# Patient Record
Sex: Female | Born: 1945 | Race: Black or African American | Hispanic: No | Smoking: Never smoker
Health system: Southern US, Community
[De-identification: ages and names within clinical notes are randomized; demographics above are authoritative.]

## PROBLEM LIST (undated history)

## (undated) ENCOUNTER — Emergency Department (HOSPITAL_COMMUNITY): Admission: EM | Payer: Medicare Other

## (undated) DIAGNOSIS — M549 Dorsalgia, unspecified: Secondary | ICD-10-CM

## (undated) DIAGNOSIS — IMO0002 Reserved for concepts with insufficient information to code with codable children: Secondary | ICD-10-CM

## (undated) DIAGNOSIS — E785 Hyperlipidemia, unspecified: Secondary | ICD-10-CM

## (undated) DIAGNOSIS — Z923 Personal history of irradiation: Secondary | ICD-10-CM

## (undated) DIAGNOSIS — E119 Type 2 diabetes mellitus without complications: Secondary | ICD-10-CM

## (undated) DIAGNOSIS — M1990 Unspecified osteoarthritis, unspecified site: Secondary | ICD-10-CM

## (undated) DIAGNOSIS — N189 Chronic kidney disease, unspecified: Secondary | ICD-10-CM

## (undated) DIAGNOSIS — E669 Obesity, unspecified: Secondary | ICD-10-CM

## (undated) DIAGNOSIS — I6529 Occlusion and stenosis of unspecified carotid artery: Secondary | ICD-10-CM

## (undated) DIAGNOSIS — I219 Acute myocardial infarction, unspecified: Secondary | ICD-10-CM

## (undated) DIAGNOSIS — I440 Atrioventricular block, first degree: Secondary | ICD-10-CM

## (undated) DIAGNOSIS — R351 Nocturia: Secondary | ICD-10-CM

## (undated) DIAGNOSIS — H269 Unspecified cataract: Secondary | ICD-10-CM

## (undated) DIAGNOSIS — I350 Nonrheumatic aortic (valve) stenosis: Secondary | ICD-10-CM

## (undated) DIAGNOSIS — I2510 Atherosclerotic heart disease of native coronary artery without angina pectoris: Secondary | ICD-10-CM

## (undated) DIAGNOSIS — I10 Essential (primary) hypertension: Secondary | ICD-10-CM

## (undated) DIAGNOSIS — R350 Frequency of micturition: Secondary | ICD-10-CM

## (undated) DIAGNOSIS — M5126 Other intervertebral disc displacement, lumbar region: Secondary | ICD-10-CM

## (undated) HISTORY — PX: COLONOSCOPY: SHX174

## (undated) HISTORY — DX: Type 2 diabetes mellitus without complications: E11.9

## (undated) HISTORY — DX: Atherosclerotic heart disease of native coronary artery without angina pectoris: I25.10

## (undated) HISTORY — DX: Occlusion and stenosis of unspecified carotid artery: I65.29

## (undated) HISTORY — PX: ABDOMINAL HYSTERECTOMY: SHX81

## (undated) HISTORY — DX: Obesity, unspecified: E66.9

## (undated) HISTORY — DX: Hyperlipidemia, unspecified: E78.5

## (undated) HISTORY — DX: Other intervertebral disc displacement, lumbar region: M51.26

## (undated) HISTORY — PX: BACK SURGERY: SHX140

## (undated) HISTORY — PX: CARPAL TUNNEL RELEASE: SHX101

## (undated) HISTORY — DX: Essential (primary) hypertension: I10

## (undated) HISTORY — PX: TONSILLECTOMY: SUR1361

## (undated) HISTORY — PX: BREAST BIOPSY: SHX20

## (undated) HISTORY — PX: CHOLECYSTECTOMY: SHX55

## (undated) HISTORY — DX: Atrioventricular block, first degree: I44.0

## (undated) HISTORY — PX: LEG TENDON SURGERY: SHX1004

---

## 2001-03-10 ENCOUNTER — Encounter: Payer: Self-pay | Admitting: Cardiology

## 2001-03-17 ENCOUNTER — Encounter: Payer: Self-pay | Admitting: Cardiology

## 2006-08-05 ENCOUNTER — Encounter: Payer: Self-pay | Admitting: Cardiology

## 2006-08-27 DIAGNOSIS — I219 Acute myocardial infarction, unspecified: Secondary | ICD-10-CM

## 2006-08-27 HISTORY — DX: Acute myocardial infarction, unspecified: I21.9

## 2006-08-27 HISTORY — PX: CARDIAC CATHETERIZATION: SHX172

## 2006-09-25 ENCOUNTER — Encounter: Payer: Self-pay | Admitting: Cardiology

## 2006-09-26 ENCOUNTER — Inpatient Hospital Stay (HOSPITAL_COMMUNITY): Admission: EM | Admit: 2006-09-26 | Discharge: 2006-09-28 | Payer: Self-pay | Admitting: Emergency Medicine

## 2006-09-26 ENCOUNTER — Ambulatory Visit: Payer: Self-pay | Admitting: Cardiology

## 2006-10-11 ENCOUNTER — Ambulatory Visit: Payer: Self-pay | Admitting: Cardiology

## 2006-10-31 ENCOUNTER — Encounter: Payer: Self-pay | Admitting: Cardiology

## 2006-11-12 ENCOUNTER — Encounter: Payer: Self-pay | Admitting: Cardiology

## 2006-12-11 ENCOUNTER — Ambulatory Visit: Payer: Self-pay | Admitting: Cardiology

## 2007-02-03 ENCOUNTER — Encounter: Payer: Self-pay | Admitting: Cardiology

## 2007-08-26 ENCOUNTER — Ambulatory Visit: Payer: Self-pay | Admitting: Cardiology

## 2007-09-04 ENCOUNTER — Encounter: Payer: Self-pay | Admitting: Physician Assistant

## 2007-09-04 ENCOUNTER — Ambulatory Visit: Payer: Self-pay | Admitting: Cardiology

## 2008-02-20 ENCOUNTER — Encounter: Payer: Self-pay | Admitting: Cardiology

## 2008-05-05 ENCOUNTER — Encounter: Payer: Self-pay | Admitting: Cardiology

## 2008-06-11 ENCOUNTER — Ambulatory Visit: Payer: Self-pay | Admitting: Cardiology

## 2009-04-14 ENCOUNTER — Encounter (INDEPENDENT_AMBULATORY_CARE_PROVIDER_SITE_OTHER): Payer: Self-pay | Admitting: *Deleted

## 2009-06-16 ENCOUNTER — Encounter: Payer: Self-pay | Admitting: Cardiology

## 2009-06-28 ENCOUNTER — Encounter: Admission: RE | Admit: 2009-06-28 | Discharge: 2009-06-28 | Payer: Self-pay | Admitting: Neurosurgery

## 2009-07-05 ENCOUNTER — Encounter: Admission: RE | Admit: 2009-07-05 | Discharge: 2009-07-05 | Payer: Self-pay | Admitting: Neurosurgery

## 2009-07-15 ENCOUNTER — Encounter: Payer: Self-pay | Admitting: Cardiology

## 2009-07-15 ENCOUNTER — Telehealth (INDEPENDENT_AMBULATORY_CARE_PROVIDER_SITE_OTHER): Payer: Self-pay | Admitting: *Deleted

## 2009-07-20 ENCOUNTER — Ambulatory Visit (HOSPITAL_COMMUNITY): Admission: RE | Admit: 2009-07-20 | Discharge: 2009-07-21 | Payer: Self-pay | Admitting: Neurosurgery

## 2009-08-24 ENCOUNTER — Ambulatory Visit: Payer: Self-pay | Admitting: Cardiology

## 2009-08-24 DIAGNOSIS — I259 Chronic ischemic heart disease, unspecified: Secondary | ICD-10-CM | POA: Insufficient documentation

## 2009-08-24 DIAGNOSIS — E119 Type 2 diabetes mellitus without complications: Secondary | ICD-10-CM | POA: Insufficient documentation

## 2009-08-24 DIAGNOSIS — Z9889 Other specified postprocedural states: Secondary | ICD-10-CM | POA: Insufficient documentation

## 2009-08-24 DIAGNOSIS — E785 Hyperlipidemia, unspecified: Secondary | ICD-10-CM | POA: Insufficient documentation

## 2010-01-20 ENCOUNTER — Telehealth: Payer: Self-pay | Admitting: Cardiology

## 2010-02-03 ENCOUNTER — Telehealth: Payer: Self-pay | Admitting: Cardiology

## 2010-02-09 ENCOUNTER — Encounter: Payer: Self-pay | Admitting: Cardiology

## 2010-02-14 ENCOUNTER — Encounter: Admission: RE | Admit: 2010-02-14 | Discharge: 2010-02-14 | Payer: Self-pay | Admitting: Neurosurgery

## 2010-02-20 ENCOUNTER — Encounter: Payer: Self-pay | Admitting: Cardiology

## 2010-02-20 ENCOUNTER — Ambulatory Visit: Payer: Self-pay | Admitting: Cardiology

## 2010-02-21 ENCOUNTER — Ambulatory Visit: Payer: Self-pay | Admitting: Oncology

## 2010-02-22 ENCOUNTER — Encounter: Payer: Self-pay | Admitting: Cardiology

## 2010-02-22 LAB — COMPREHENSIVE METABOLIC PANEL
ALT: 20 U/L (ref 0–35)
AST: 19 U/L (ref 0–37)
Albumin: 4.4 g/dL (ref 3.5–5.2)
Alkaline Phosphatase: 46 U/L (ref 39–117)
BUN: 23 mg/dL (ref 6–23)
CO2: 24 mEq/L (ref 19–32)
Calcium: 9.7 mg/dL (ref 8.4–10.5)
Chloride: 102 mEq/L (ref 96–112)
Creatinine, Ser: 1.3 mg/dL — ABNORMAL HIGH (ref 0.40–1.20)
Glucose, Bld: 193 mg/dL — ABNORMAL HIGH (ref 70–99)
Potassium: 4.1 mEq/L (ref 3.5–5.3)
Sodium: 140 mEq/L (ref 135–145)
Total Bilirubin: 0.5 mg/dL (ref 0.3–1.2)
Total Protein: 7.2 g/dL (ref 6.0–8.3)

## 2010-02-22 LAB — CBC WITH DIFFERENTIAL/PLATELET
BASO%: 0.4 % (ref 0.0–2.0)
Basophils Absolute: 0 10*3/uL (ref 0.0–0.1)
EOS%: 2.7 % (ref 0.0–7.0)
Eosinophils Absolute: 0.2 10*3/uL (ref 0.0–0.5)
HCT: 35.5 % (ref 34.8–46.6)
HGB: 11.9 g/dL (ref 11.6–15.9)
LYMPH%: 35.5 % (ref 14.0–49.7)
MCH: 30 pg (ref 25.1–34.0)
MCHC: 33.7 g/dL (ref 31.5–36.0)
MCV: 89 fL (ref 79.5–101.0)
MONO#: 0.4 10*3/uL (ref 0.1–0.9)
MONO%: 5.9 % (ref 0.0–14.0)
NEUT#: 3.6 10*3/uL (ref 1.5–6.5)
NEUT%: 55.5 % (ref 38.4–76.8)
Platelets: 273 10*3/uL (ref 145–400)
RBC: 3.98 10*6/uL (ref 3.70–5.45)
RDW: 14.9 % — ABNORMAL HIGH (ref 11.2–14.5)
WBC: 6.5 10*3/uL (ref 3.9–10.3)
lymph#: 2.3 10*3/uL (ref 0.9–3.3)

## 2010-02-22 LAB — LACTATE DEHYDROGENASE: LDH: 135 U/L (ref 94–250)

## 2010-02-23 ENCOUNTER — Ambulatory Visit: Payer: Self-pay | Admitting: Cardiology

## 2010-03-03 ENCOUNTER — Ambulatory Visit (HOSPITAL_COMMUNITY): Admission: RE | Admit: 2010-03-03 | Discharge: 2010-03-03 | Payer: Self-pay | Admitting: Oncology

## 2010-03-17 ENCOUNTER — Telehealth (INDEPENDENT_AMBULATORY_CARE_PROVIDER_SITE_OTHER): Payer: Self-pay | Admitting: *Deleted

## 2010-03-17 ENCOUNTER — Encounter: Payer: Self-pay | Admitting: Cardiology

## 2010-03-23 ENCOUNTER — Inpatient Hospital Stay (HOSPITAL_COMMUNITY): Admission: RE | Admit: 2010-03-23 | Discharge: 2010-03-24 | Payer: Self-pay | Admitting: Neurosurgery

## 2010-03-24 ENCOUNTER — Encounter: Payer: Self-pay | Admitting: Cardiology

## 2010-08-27 HISTORY — PX: CARDIAC CATHETERIZATION: SHX172

## 2010-09-15 ENCOUNTER — Ambulatory Visit: Payer: Self-pay | Admitting: Oncology

## 2010-09-17 ENCOUNTER — Encounter: Payer: Self-pay | Admitting: Neurosurgery

## 2010-09-19 ENCOUNTER — Ambulatory Visit (HOSPITAL_COMMUNITY)
Admission: RE | Admit: 2010-09-19 | Discharge: 2010-09-19 | Payer: Self-pay | Source: Home / Self Care | Attending: Oncology | Admitting: Oncology

## 2010-09-19 ENCOUNTER — Encounter: Payer: Self-pay | Admitting: Cardiology

## 2010-09-19 LAB — CMP (CANCER CENTER ONLY)
ALT(SGPT): 22 U/L (ref 10–47)
AST: 23 U/L (ref 11–38)
Albumin: 4.2 g/dL (ref 3.3–5.5)
Alkaline Phosphatase: 48 U/L (ref 26–84)
BUN, Bld: 19 mg/dL (ref 7–22)
CO2: 30 mEq/L (ref 18–33)
Calcium: 9.1 mg/dL (ref 8.0–10.3)
Chloride: 98 mEq/L (ref 98–108)
Creat: 1.3 mg/dl — ABNORMAL HIGH (ref 0.6–1.2)
Glucose, Bld: 126 mg/dL — ABNORMAL HIGH (ref 73–118)
Potassium: 4.1 mEq/L (ref 3.3–4.7)
Sodium: 145 mEq/L (ref 128–145)
Total Bilirubin: 0.6 mg/dl (ref 0.20–1.60)
Total Protein: 8 g/dL (ref 6.4–8.1)

## 2010-09-19 LAB — CBC WITH DIFFERENTIAL/PLATELET
BASO%: 0.4 % (ref 0.0–2.0)
Basophils Absolute: 0 10*3/uL (ref 0.0–0.1)
EOS%: 4.1 % (ref 0.0–7.0)
Eosinophils Absolute: 0.3 10*3/uL (ref 0.0–0.5)
HCT: 39.3 % (ref 34.8–46.6)
HGB: 13 g/dL (ref 11.6–15.9)
LYMPH%: 33.3 % (ref 14.0–49.7)
MCH: 29.4 pg (ref 25.1–34.0)
MCHC: 33 g/dL (ref 31.5–36.0)
MCV: 89.2 fL (ref 79.5–101.0)
MONO#: 0.5 10*3/uL (ref 0.1–0.9)
MONO%: 6.9 % (ref 0.0–14.0)
NEUT#: 3.7 10*3/uL (ref 1.5–6.5)
NEUT%: 55.3 % (ref 38.4–76.8)
Platelets: 276 10*3/uL (ref 145–400)
RBC: 4.41 10*6/uL (ref 3.70–5.45)
RDW: 14.8 % — ABNORMAL HIGH (ref 11.2–14.5)
WBC: 6.6 10*3/uL (ref 3.9–10.3)
lymph#: 2.2 10*3/uL (ref 0.9–3.3)

## 2010-09-19 LAB — LACTATE DEHYDROGENASE: LDH: 136 U/L (ref 94–250)

## 2010-09-22 ENCOUNTER — Ambulatory Visit
Admission: RE | Admit: 2010-09-22 | Discharge: 2010-09-22 | Payer: Self-pay | Source: Home / Self Care | Attending: Cardiology | Admitting: Cardiology

## 2010-09-26 NOTE — Progress Notes (Signed)
Summary: surgical date   Phone Note Call from Patient   Summary of Call: Just wanted to let Dr. Andee Lineman know she is having her back surgery on 7/28 with Dr. Wynetta Emery. Hoover Brunette, LPN  March 17, 2010 12:06 PM   Follow-up for Phone Call        Thanks Follow-up by: Lewayne Bunting, MD, [facility],  March 20, 2010 11:54 AM

## 2010-09-26 NOTE — Miscellaneous (Signed)
Summary: Orders Update  Clinical Lists Changes  Orders: Added new Referral order of Nuclear Med (Nuc Med) - Signed 

## 2010-09-26 NOTE — Progress Notes (Signed)
Summary: Plavix and aspirn stopped for surgery   Phone Note Call from Patient   Caller: Patient Call For: nurse Summary of Call: patient left message on machine that her plavix and aspirin has been stopped since she's having surgery on Wednesday next week and she was informed to call and let MD know. Initial call taken by: Carlye Grippe,  July 15, 2009 4:11 PM  Follow-up for Phone Call        OK to resume ASA 325 mg alone after surgery.  Lewayne Bunting, MD, [facility]  July 17, 2009 7:19 PM      Appended Document: Plavix and aspirn stopped for surgery Pt notified. Pt verbalized understanding. Pt states just bought a new bottle of Plavix. Notified she can finish this supply before stopping and changing Aspirin to 325mg  once daily if she wants to. Pt verbalized understanding.

## 2010-09-26 NOTE — Progress Notes (Signed)
Summary: PHONE: TESTING BEFORE OV   Phone Note Call from Patient   Caller: Patient Details for Reason: ? test for upcoming visit Summary of Call: Mrs. [name] was seen in December 2010. Mrs. [name] thought that you wanted her to have an echo before her next visit with you and the note states Myoview Scan. Can you review and advise? Initial call taken by: Zachary George,  Jan 20, 2010 2:01 PM  Follow-up for Phone Call        the note  is pretty clear on the subject.  The patient needs Cardiolite study Follow-up by: Lewayne Bunting, MD, [facility],  Jan 22, 2010 8:27 PM  Additional Follow-up for Phone Call Additional follow up Details #1::        Vicky, do I need to do anything with this note?   Hoover Brunette, LPN  January 26, 5187 3:17 PM

## 2010-09-26 NOTE — Letter (Signed)
Summary: Internal Correspondence/ [facility] EVALUATION   Internal Correspondence/ [facility] EVALUATION   Imported By: Dorise Hiss 03/02/2010 11:48:13  _____________________________________________________________________  External Attachment:    Type:   Image     Comment:   External Document

## 2010-09-26 NOTE — Letter (Signed)
Summary: Appointment- Rescheduled  [facility] [address]   [place], [place] [address]   Phone: [phone]  Fax: [phone]     June 16, 2009 MRN: [id number]      [facility] [address] [place], [place]  [address]     Dear [name],   Due to a change in our office schedule, your appointment on June 17, 2009                     must be changed.    Your new appointment will be August 24, 2009 at 1:15 pm.  We look forward to participating in your health care needs.        Sincerely,  Glass blower/designer

## 2010-09-26 NOTE — Assessment & Plan Note (Signed)
Summary: 6 mo fu per june reminder-srs  Medications Added METFORMIN HCL 500 MG XR24H-TAB (METFORMIN HCL) Take 2 tablet by mouth two times a day HYDROCODONE-ACETAMINOPHEN 5-500 MG TABS (HYDROCODONE-ACETAMINOPHEN) take 1-2 by mouth every 4-6 hours as needed pain      Allergies Added: NKDA  Visit Type:  Follow-up Primary Provider:  Youlanda Roys   History of Present Illness: the patient is a 65 year old female with history of coronary artery disease, status post stent placement to the LAD in 2008. She underwent a surveillance Cardiolite stress study a few days ago. Her ejection fraction was 61%. There was a small area of anteroapical ischemia versus breast attenuation. The patient however does not complain of any chest pain. She is very active and does regularly water aerobics. She experiences no problem during these activities and also has no shortness of breath.  She continues to have problems with her back. She has had prior back surgery by Dr. Denzil Magnuson now reports symptoms of sciatica. She had a repeat MRI done and apparently some abnormal lymph nodes were found.[name] She's been referred to oncology for this. She has an appointment on July 8 for a full body CT scan.  From a cardiovascular standpoint the patient is otherwise stable.  Preventive Screening-Counseling & Management  Alcohol-Tobacco     Smoking Status: never  Current Medications (verified): 1)  Toprol Xl 200 Mg Xr24h-Tab (Metoprolol Succinate) .... Take 1 Tablet By Mouth Once A Day 2)  Allopurinol 100 Mg Tabs (Allopurinol) .... Take 1 Tablet By Mouth Once A Day 3)  Norvasc 5 Mg Tabs (Amlodipine Besylate) .... Take 1 Tablet By Mouth Once A Day 4)  Lisinopril 40 Mg Tabs (Lisinopril) .... Take 1 Tablet By Mouth Once A Day 5)  Metformin Hcl 500 Mg Xr24h-Tab (Metformin Hcl) .... Take 2 Tablet By Mouth Two Times A Day 6)  Vytorin 10-20 Mg Tabs (Ezetimibe-Simvastatin) .... Take 1 Tablet By Mouth Once A Day 7)  Fish Oil 1000 Mg  Caps (Omega-3 Fatty Acids) .... Take 1 Capsule By Mouth Once A Day 8)  Hydrochlorothiazide 25 Mg Tabs (Hydrochlorothiazide) .... Take 1 Tablet By Mouth Once A Day 9)  Aspirin 325 Mg Tabs (Aspirin) .... Take 1  Tablet By Mouth Once A Day 10)  Nitroglycerin 0.4 Mg Subl (Nitroglycerin) .... As Needed 11)  Hydrocodone-Acetaminophen 5-500 Mg Tabs (Hydrocodone-Acetaminophen) .... Take 1-2 By Mouth Every 4-6 Hours As Needed Pain  Allergies (verified): No Known Drug Allergies  Comments:  Nurse/Medical Assistant: The patient's medication list and allergies were reviewed with the patient and were updated in the Medication and Allergy Lists.  Past History:  Past Medical History: Last updated: 08/24/2009 CAD LVD-MILD Hypertension DYSLIPIDEMIA OBESITY HERNIATED LUMBAR DISK FIRST DEGREE AV BLOCK HX OF GOUT TYPE 2 DIABETES  1. Multivessel coronary artery disease, quiescent.     a.     Non-STEMI/Taxus stenting 100% left anterior descending (2),      January 2008.     b.     Residual 70% circumflex; 80% right coronary artery.     c.     Mild LVD (EF 45%); improved to 55-60% by 2-D echocardiogram,      January 2009. 2. Type 2 diabetes mellitus. 3. Hypertension. 4. Dyslipidemia. 5. Obesity. 6. Herniated lumbar disk.   Past Surgical History: Last updated: 03/08/2009 NON-STEMI/TAXUS STENTING 100% PROXIMAL LAD (X2) JANUARY 2008 CARDIAC CATH 2008  Family History: Last updated: 08/24/2009 noncontributory  Social History: Last updated: 03/08/2009 Single   Risk Factors: Smoking Status:  never (02/23/2010)  Social History: Smoking Status:  never  Review of Systems  The patient denies fatigue, malaise, fever, weight gain/loss, vision loss, decreased hearing, hoarseness, chest pain, palpitations, shortness of breath, prolonged cough, wheezing, sleep apnea, coughing up blood, abdominal pain, blood in stool, nausea, vomiting, diarrhea, heartburn, incontinence, blood in urine, muscle  weakness, joint pain, leg swelling, rash, skin lesions, headache, fainting, dizziness, depression, anxiety, enlarged lymph nodes, easy bruising or bleeding, and environmental allergies.    Vital Signs:  Patient profile:   65 year old female Height:      63 inches Weight:      219 pounds O2 Sat:      98 % on Room air Pulse rate:   69 / minute BP sitting:   112 / 73  (left arm) Cuff size:   large  Vitals Entered By: Carlye Grippe (February 23, 2010 8:22 AM)  O2 Flow:  Room air  Physical Exam  Additional Exam:  General: Well-developed, well-nourished in no distress head: Normocephalic and atraumatic eyes PERRLA/EOMI intact, conjunctiva and lids normal nose: No deformity or lesions mouth normal dentition, normal posterior pharynx neck: Supple, no JVD.  No masses, thyromegaly or abnormal cervical nodes lungs: Normal breath sounds bilaterally without wheezing.  Normal percussion heart: regular rate and rhythm with normal S1 and S2, no S3 or S4.  PMI is normal.  No pathological murmurs abdomen: Normal bowel sounds, abdomen is soft and nontender without masses, organomegaly or hernias noted.  No hepatosplenomegaly musculoskeletal: Back normal, normal gait muscle strength and tone normal pulsus: Pulse is normal in all 4 extremities Extremities: No peripheral pitting edema neurologic: Alert and oriented x 3 skin: Intact without lesions or rashes cervical nodes: No significant adenopathy psychologic: Normal affect    Impression & Recommendations:  Problem # 1:  CORONARY ARTERY DISEASE, S/P PTCA (ICD-414.9) low risk Cardiolite study. Small area of anteroapical ischemia versus breast attenuation. No clinical chest pain. Continue medical therapy. Her updated medication list for this problem includes:    Toprol Xl 200 Mg Xr24h-tab (Metoprolol succinate) .[name]... Take 1 tablet by mouth once a day    Norvasc 5 Mg Tabs (Amlodipine besylate) .[name]... Take 1 tablet by mouth once a day    Lisinopril 40  Mg Tabs (Lisinopril) .[name]... Take 1 tablet by mouth once a day    Aspirin 325 Mg Tabs (Aspirin) .[name]... Take 1  tablet by mouth once a day    Nitroglycerin 0.4 Mg Subl (Nitroglycerin) .[name]... As needed  Problem # 2:  DYSLIPIDEMIA (ICD-272.4) followed by the patient's primary care physician. Her updated medication list for this problem includes:    Vytorin 10-20 Mg Tabs (Ezetimibe-simvastatin) .[name]... Take 1 tablet by mouth once a day  Problem # 3:  DM (ICD-250.00) Assessment: Comment Only  Her updated medication list for this problem includes:    Lisinopril 40 Mg Tabs (Lisinopril) .[name]... Take 1 tablet by mouth once a day    Metformin Hcl 500 Mg Xr24h-tab (Metformin hcl) .[name]... Take 2 tablet by mouth two times a day    Aspirin 325 Mg Tabs (Aspirin) .[name]... Take 1  tablet by mouth once a day  Patient Instructions: 1)  Your physician wants you to follow-up in: 6 months. You will receive a reminder letter in the mail one-two months in advance. If you don't receive a letter, please call our office to schedule the follow-up appointment. 2)  Your physician recommends that you continue on your current medications as directed. Please refer to  the Current Medication list given to you today.

## 2010-09-26 NOTE — Miscellaneous (Signed)
Summary: med list  Clinical Lists Changes  Medications: Changed medication from CALCIUM 600 MG TABS (CALCIUM) to CALCIUM 600 MG TABS (CALCIUM) once daily - Signed

## 2010-09-26 NOTE — Letter (Signed)
Summary: Internal Correspondence/ [facility] PROGRESS NO  Internal Correspondence/ [facility] PROGRESS NOTE   Imported By: Dorise Hiss 03/23/2010 15:50:26  _____________________________________________________________________  External Attachment:    Type:   Image     Comment:   External Document

## 2010-09-26 NOTE — Progress Notes (Signed)
Summary: PHONE: MYOVIEW TEST   Phone Note Call from Patient   Summary of Call: Dr. Andee Lineman. Please advise on [name] what type of Myoview scan would you like for her to have and if she needs to be on meds or off meds. Initial call taken by: Zachary George,  February 03, 2010 9:53 AM  Follow-up for Phone Call        Lexiscan on medications. Follow-up by: Lewayne Bunting, MD, [facility],  February 05, 2010 12:49 PM

## 2010-09-26 NOTE — Assessment & Plan Note (Signed)
Summary: 6 MO FU PER APRIL REMINDER-SRS  Medications Added METFORMIN HCL 500 MG TABS (METFORMIN HCL) take one by mouth in am and two in pm ASPIRIN 325 MG TABS (ASPIRIN) Take 1  tablet by mouth once a day      Allergies Added: NKDA  Visit Type:  Follow-up Primary Provider:  Neita Carp  CC:  follow-up visit.  History of Present Illness: the patient is a 65 year old female with a history of coronary artery disease status post stent placement to the LAD in 2008. She has normal LV function. She recently has undergone back surgery. She had no cardiovascular complications. Reports no chest pain shortness of breath with talking or PND. She is now doing remarkably well with able to walk she'll follow up with Dr. Wynetta Emery in January. She has no orthopnea PND palpitations or syncope.  Current Medications (verified): 1)  Toprol Xl 200 Mg Xr24h-Tab (Metoprolol Succinate) .... Take 1 Tablet By Mouth Once A Day 2)  Allopurinol 100 Mg Tabs (Allopurinol) .... Take 1 Tablet By Mouth Once A Day 3)  Norvasc 5 Mg Tabs (Amlodipine Besylate) .... Take 1 Tablet By Mouth Once A Day 4)  Lisinopril 40 Mg Tabs (Lisinopril) .... Take 1 Tablet By Mouth Once A Day 5)  Metformin Hcl 500 Mg Tabs (Metformin Hcl) .... Take One By Mouth in Am and Two in Pm 6)  Vytorin 10-20 Mg Tabs (Ezetimibe-Simvastatin) .... Take 1 Tablet By Mouth Once A Day 7)  Fish Oil 1000 Mg Caps (Omega-3 Fatty Acids) .... Take 1 Capsule By Mouth Once A Day 8)  Hydrochlorothiazide 25 Mg Tabs (Hydrochlorothiazide) .... Take 1 Tablet By Mouth Once A Day 9)  Aspirin 325 Mg Tabs (Aspirin) .... Take 1  Tablet By Mouth Once A Day 10)  Nitroglycerin 0.4 Mg Subl (Nitroglycerin) .... As Needed 11)  Hydrocodone .... As Needed 12)  Multi Vit  Allergies (verified): No Known Drug Allergies  Comments:  Nurse/Medical Assistant: The patient's medications and allergies were reviewed with the patient and were updated in the Medication and Allergy Lists. Patient   brought list to office.  Past History:  Past Surgical History: Last updated: 03/08/2009 NON-STEMI/TAXUS STENTING 100% PROXIMAL LAD (X2) JANUARY 2008 CARDIAC CATH 2008  Past Medical History: CAD LVD-MILD Hypertension DYSLIPIDEMIA OBESITY HERNIATED LUMBAR DISK FIRST DEGREE AV BLOCK HX OF GOUT TYPE 2 DIABETES  1. Multivessel coronary artery disease, quiescent.     a.     Non-STEMI/Taxus stenting 100% left anterior descending (2),      January 2008.     b.     Residual 70% circumflex; 80% right coronary artery.     c.     Mild LVD (EF 45%); improved to 55-60% by 2-D echocardiogram,      January 2009. 2. Type 2 diabetes mellitus. 3. Hypertension. 4. Dyslipidemia. 5. Obesity. 6. Herniated lumbar disk.   Family History: Reviewed history and no changes required. noncontributory  Social History: Reviewed history from 03/08/2009 and no changes required. Single   Vital Signs:  Patient profile:   65 year old female Height:      63 inches Weight:      214 pounds BMI:     38.05 O2 Sat:      98 % Pulse rate:   87 / minute BP sitting:   129 / 83  (left arm) Cuff size:   large  Vitals Entered By: Carlye Grippe (August 24, 2009 1:23 PM)  Nutrition Counseling: Patient's BMI is greater than 25 and  therefore counseled on weight management options. CC: follow-up visit   Physical Exam  General:  Well developed, well nourished, in no acute distress. Head:  normocephalic and atraumatic Eyes:  PERRLA/EOM intact; conjunctiva and lids normal. Mouth:  Teeth, gums and palate normal. Oral mucosa normal. Neck:  Neck supple, no JVD. No masses, thyromegaly or abnormal cervical nodes. Lungs:  Clear bilaterally to auscultation and percussion. Heart:  Non-displaced PMI, chest non-tender; regular rate and rhythm, S1, S2 without murmurs, rubs or gallops. Carotid upstroke normal, no bruit. Normal abdominal aortic size, no bruits. Femorals normal pulses, no bruits. Pedals normal pulses. No  edema, no varicosities. Abdomen:  Bowel sounds positive; abdomen soft and non-tender without masses, organomegaly, or hernias noted. No hepatosplenomegaly. Msk:  Back normal, normal gait. Muscle strength and tone normal. Pulses:  pulses normal in all 4 extremities Extremities:  No clubbing or cyanosis. Neurologic:  Alert and oriented x 3. Skin:  Intact without lesions or rashes. Cervical Nodes:  no significant adenopathy Psych:  Normal affect.   Impression & Recommendations:  Problem # 1:  CORONARY ARTERY DISEASE, S/P PTCA (ICD-414.9) patient reports no chest pain. For a Cardiolite study in 6 months. This will be arranged before her next visit. The following medications were removed from the medication list:    Plavix 75 Mg Tabs (Clopidogrel bisulfate) .[name]... Take 1 tablet by mouth once a day Her updated medication list for this problem includes:    Toprol Xl 200 Mg Xr24h-tab (Metoprolol succinate) .[name]... Take 1 tablet by mouth once a day    Norvasc 5 Mg Tabs (Amlodipine besylate) .[name]... Take 1 tablet by mouth once a day    Lisinopril 40 Mg Tabs (Lisinopril) .[name]... Take 1 tablet by mouth once a day    Aspirin 325 Mg Tabs (Aspirin) .[name]... Take 1  tablet by mouth once a day    Nitroglycerin 0.4 Mg Subl (Nitroglycerin) .[name]... As needed  Problem # 2:  DYSLIPIDEMIA (ICD-272.4) the patient on statin drug therapy. Her updated medication list for this problem includes:    Vytorin 10-20 Mg Tabs (Ezetimibe-simvastatin) .[name]... Take 1 tablet by mouth once a day  Problem # 3:  DM (ICD-250.00) Assessment: Comment Only  Her updated medication list for this problem includes:    Lisinopril 40 Mg Tabs (Lisinopril) .[name]... Take 1 tablet by mouth once a day    Metformin Hcl 500 Mg Tabs (Metformin hcl) .[name]... Take one by mouth in am and two in pm    Aspirin 325 Mg Tabs (Aspirin) .[name]... Take 1  tablet by mouth once a day  Problem # 4:  LAMINECTOMY, HX OF (ICD-V45.89) no cardiovascular complications during  surgery.  Patient Instructions: 1)  Myoview scan on meds in 6 months before next visit 2)  Follow up in  6 months

## 2010-09-28 NOTE — Assessment & Plan Note (Signed)
Summary: 6 MO FU PER JAN REMINDER-SRS  Medications Added VITAMIN D3 1000 UNIT TABS (CHOLECALCIFEROL) Take 1 tablet by mouth once a day      Allergies Added: NKDA  Visit Type:  Follow-up Primary Provider:  Youlanda Roys   History of Present Illness: the patient is a 65 year old African-American female with prior history of stent placement in 2008.  The patient is currently not on Plavix anymore.  She has normal LV function.  She has been doing well.  She denies any chest pain shortness of breath orthopnea PND.  Chest no palpitations or syncope.  The patient underwent back surgery for spinal stenosis.  She developed no perioperative cardiac complications.  She is doing quite well.  She does remain compliant with her medical therapy and her blood pressure is under very good control.  The patient reports no palpitations presyncope or syncope.  Preventive Screening-Counseling & Management  Alcohol-Tobacco     Smoking Status: never  Current Medications (verified): 1)  Toprol Xl 200 Mg Xr24h-Tab (Metoprolol Succinate) .... Take 1 Tablet By Mouth Once A Day 2)  Allopurinol 100 Mg Tabs (Allopurinol) .... Take 1 Tablet By Mouth Once A Day 3)  Norvasc 5 Mg Tabs (Amlodipine Besylate) .... Take 1 Tablet By Mouth Once A Day 4)  Lisinopril 40 Mg Tabs (Lisinopril) .... Take 1 Tablet By Mouth Once A Day 5)  Metformin Hcl 500 Mg Xr24h-Tab (Metformin Hcl) .... Take 2 Tablet By Mouth Two Times A Day 6)  Vytorin 10-20 Mg Tabs (Ezetimibe-Simvastatin) .... Take 1 Tablet By Mouth Once A Day 7)  Fish Oil 1000 Mg Caps (Omega-3 Fatty Acids) .... Take 1 Capsule By Mouth Once A Day 8)  Hydrochlorothiazide 25 Mg Tabs (Hydrochlorothiazide) .... Take 1 Tablet By Mouth Once A Day 9)  Aspirin 325 Mg Tabs (Aspirin) .... Take 1  Tablet By Mouth Once A Day 10)  Nitroglycerin 0.4 Mg Subl (Nitroglycerin) .... As Needed 11)  Hydrocodone-Acetaminophen 5-500 Mg Tabs (Hydrocodone-Acetaminophen) .... Take 1-2 By Mouth  Every 4-6 Hours As Needed Pain 12)  Vitamin D3 1000 Unit Tabs (Cholecalciferol) .... Take 1 Tablet By Mouth Once A Day  Allergies (verified): No Known Drug Allergies  Comments:  Nurse/Medical Assistant: The patient's medication list and allergies were reviewed with the patient and were updated in the Medication and Allergy Lists.  Past History:  Past Surgical History: Last updated: 03/08/2009 NON-STEMI/TAXUS STENTING 100% PROXIMAL LAD (X2) JANUARY 2008 CARDIAC CATH 2008  Family History: Last updated: 08/24/2009 noncontributory  Social History: Last updated: 03/08/2009 Single   Risk Factors: Smoking Status: never (09/22/2010)  Past Medical History: CAD LVD-MILD Hypertension DYSLIPIDEMIA OBESITY HERNIATED LUMBAR DISK FIRST DEGREE AV BLOCK HX OF GOUT TYPE 2 DIABETES  1. Multivessel coronary artery disease, quiescent.     a.     Non-STEMI/Taxus stenting 100% left anterior descending (2),      January 2008.     b.     Residual 70% circumflex; 80% right coronary artery.     c.     Mild LVD (EF 45%); improved to 55-60% by 2-D echocardiogram,      January 2009. 2. Type 2 diabetes mellitus. 3. Hypertension. 4. Dyslipidemia. 5. Obesity. 6. Herniated lumbar disk.  Cardiolite 2011 low risk with small anterior defect consistent with attenuation artifact.  Normal LV function.  Vital Signs:  Patient profile:   65 year old female Height:      63 inches Weight:      218 pounds BMI:  38.76 Pulse rate:   69 / minute BP sitting:   110 / 78  (left arm) Cuff size:   large  Vitals Entered By: Carlye Grippe (September 22, 2010 10:33 AM)  Nutrition Counseling: Patient's BMI is greater than 25 and therefore counseled on weight management options.  Physical Exam  Additional Exam:  General: Well-developed, well-nourished in no distress head: Normocephalic and atraumatic eyes PERRLA/EOMI intact, conjunctiva and lids normal nose: No deformity or lesions mouth normal  dentition, normal posterior pharynx neck: Supple, no JVD.  No masses, thyromegaly or abnormal cervical nodes lungs: Normal breath sounds bilaterally without wheezing.  Normal percussion heart: regular rate and rhythm with normal S1 and S2, no S3 or S4.  PMI is normal.  No pathological murmurs abdomen: Normal bowel sounds, abdomen is soft and nontender without masses, organomegaly or hernias noted.  No hepatosplenomegaly musculoskeletal: Back normal, normal gait muscle strength and tone normal pulsus: Pulse is normal in all 4 extremities Extremities: No peripheral pitting edema neurologic: Alert and oriented x 3 skin: Intact without lesions or rashes cervical nodes: No significant adenopathy psychologic: Normal affect    Impression & Recommendations:  Problem # 1:  DYSLIPIDEMIA (ICD-272.4) followed by primary care physician. Her updated medication list for this problem includes:    Vytorin 10-20 Mg Tabs (Ezetimibe-simvastatin) .[name]... Take 1 tablet by mouth once a day  Problem # 2:  LAMINECTOMY, HX OF (ICD-V45.89) the patient underwent successful laminectomy.  She had no perioperative cardiac complications.  Problem # 3:  CORONARY ARTERY DISEASE, S/P PTCA (ICD-414.9) the patient status post prior coronary intervention.  She had a stress test done of last year, which showed no ischemia.  No further testing is currently required she has normal LV function. Her updated medication list for this problem includes:    Toprol Xl 200 Mg Xr24h-tab (Metoprolol succinate) .[name]... Take 1 tablet by mouth once a day    Norvasc 5 Mg Tabs (Amlodipine besylate) .[name]... Take 1 tablet by mouth once a day    Lisinopril 40 Mg Tabs (Lisinopril) .[name]... Take 1 tablet by mouth once a day    Aspirin 325 Mg Tabs (Aspirin) .[name]... Take 1  tablet by mouth once a day    Nitroglycerin 0.4 Mg Subl (Nitroglycerin) .[name]... As needed  Problem # 4:  DM (ICD-250.00) Assessment: Comment Only  Her updated medication list for this  problem includes:    Lisinopril 40 Mg Tabs (Lisinopril) .[name]... Take 1 tablet by mouth once a day    Metformin Hcl 500 Mg Xr24h-tab (Metformin hcl) .[name]... Take 2 tablet by mouth two times a day    Aspirin 325 Mg Tabs (Aspirin) .[name]... Take 1  tablet by mouth once a day  Patient Instructions: 1)  Your physician wants you to follow-up in: 1 year. You will receive a reminder letter in the mail one-two months in advance. If you don't receive a letter, please call our office to schedule the follow-up appointment. 2)  Your physician recommends that you continue on your current medications as directed. Please refer to the Current Medication list given to you today.

## 2010-09-28 NOTE — Cardiovascular Report (Signed)
Summary: Cardiac Catheterization  Cardiac Catheterization   Imported By: Dorise Hiss 09/22/2010 08:56:49  _____________________________________________________________________  External Attachment:    Type:   Image     Comment:   External Document

## 2010-09-28 NOTE — Letter (Signed)
Summary: Discharge Summary  Discharge Summary   Imported By: Dorise Hiss 09/22/2010 08:58:08  _____________________________________________________________________  External Attachment:    Type:   Image     Comment:   External Document

## 2010-11-11 LAB — GLUCOSE, CAPILLARY
Glucose-Capillary: 109 mg/dL — ABNORMAL HIGH (ref 70–99)
Glucose-Capillary: 109 mg/dL — ABNORMAL HIGH (ref 70–99)
Glucose-Capillary: 127 mg/dL — ABNORMAL HIGH (ref 70–99)
Glucose-Capillary: 134 mg/dL — ABNORMAL HIGH (ref 70–99)
Glucose-Capillary: 146 mg/dL — ABNORMAL HIGH (ref 70–99)

## 2010-11-11 LAB — CBC
HCT: 37.1 % (ref 36.0–46.0)
Hemoglobin: 12.3 g/dL (ref 12.0–15.0)
MCH: 30.2 pg (ref 26.0–34.0)
MCHC: 33.2 g/dL (ref 30.0–36.0)
MCV: 91 fL (ref 78.0–100.0)
Platelets: 246 10*3/uL (ref 150–400)
RBC: 4.07 MIL/uL (ref 3.87–5.11)
RDW: 15.1 % (ref 11.5–15.5)
WBC: 6.6 10*3/uL (ref 4.0–10.5)

## 2010-11-11 LAB — COMPREHENSIVE METABOLIC PANEL
ALT: 28 U/L (ref 0–35)
AST: 27 U/L (ref 0–37)
Albumin: 4.1 g/dL (ref 3.5–5.2)
Alkaline Phosphatase: 46 U/L (ref 39–117)
BUN: 22 mg/dL (ref 6–23)
CO2: 30 mEq/L (ref 19–32)
Calcium: 9.5 mg/dL (ref 8.4–10.5)
Chloride: 101 mEq/L (ref 96–112)
Creatinine, Ser: 1.22 mg/dL — ABNORMAL HIGH (ref 0.4–1.2)
GFR calc Af Amer: 54 mL/min — ABNORMAL LOW (ref >60)
GFR calc non Af Amer: 44 mL/min — ABNORMAL LOW (ref >60)
Glucose, Bld: 115 mg/dL — ABNORMAL HIGH (ref 70–99)
Potassium: 4.2 mEq/L (ref 3.5–5.1)
Sodium: 138 mEq/L (ref 135–145)
Total Bilirubin: 0.6 mg/dL (ref 0.3–1.2)
Total Protein: 7.4 g/dL (ref 6.0–8.3)

## 2010-11-11 LAB — SURGICAL PCR SCREEN
MRSA, PCR: NEGATIVE
Staphylococcus aureus: NEGATIVE

## 2010-11-29 LAB — BASIC METABOLIC PANEL
BUN: 15 mg/dL (ref 6–23)
CO2: 27 mEq/L (ref 19–32)
Calcium: 9.5 mg/dL (ref 8.4–10.5)
Chloride: 101 mEq/L (ref 96–112)
Creatinine, Ser: 1.24 mg/dL — ABNORMAL HIGH (ref 0.4–1.2)
GFR calc Af Amer: 53 mL/min — ABNORMAL LOW (ref >60)
GFR calc non Af Amer: 44 mL/min — ABNORMAL LOW (ref >60)
Glucose, Bld: 128 mg/dL — ABNORMAL HIGH (ref 70–99)
Potassium: 3.7 mEq/L (ref 3.5–5.1)
Sodium: 137 mEq/L (ref 135–145)

## 2010-11-29 LAB — CBC
HCT: 38.8 % (ref 36.0–46.0)
Hemoglobin: 13.3 g/dL (ref 12.0–15.0)
MCHC: 34.2 g/dL (ref 30.0–36.0)
MCV: 91.4 fL (ref 78.0–100.0)
Platelets: 258 10*3/uL (ref 150–400)
RBC: 4.25 MIL/uL (ref 3.87–5.11)
RDW: 14.9 % (ref 11.5–15.5)
WBC: 7.4 10*3/uL (ref 4.0–10.5)

## 2010-11-29 LAB — GLUCOSE, CAPILLARY
Glucose-Capillary: 118 mg/dL — ABNORMAL HIGH (ref 70–99)
Glucose-Capillary: 120 mg/dL — ABNORMAL HIGH (ref 70–99)
Glucose-Capillary: 131 mg/dL — ABNORMAL HIGH (ref 70–99)
Glucose-Capillary: 133 mg/dL — ABNORMAL HIGH (ref 70–99)
Glucose-Capillary: 143 mg/dL — ABNORMAL HIGH (ref 70–99)

## 2010-11-29 LAB — PROTIME-INR
INR: 0.88 (ref 0.00–1.49)
Prothrombin Time: 11.9 seconds (ref 11.6–15.2)

## 2010-11-29 LAB — APTT: aPTT: 37 seconds (ref 24–37)

## 2010-12-04 ENCOUNTER — Other Ambulatory Visit: Payer: Self-pay | Admitting: *Deleted

## 2010-12-04 MED ORDER — HYDROCHLOROTHIAZIDE 25 MG PO TABS: 25.0000 mg | ORAL_TABLET | Freq: Every day | ORAL | Status: DC

## 2010-12-04 NOTE — Telephone Encounter (Signed)
Patient called and requested prescriptions be sent to both pharmacies

## 2011-01-09 NOTE — Assessment & Plan Note (Signed)
[facility]                          [facility]   LUKISHA, PROCIDA                        MRN:          [id number]  DATE:08/26/2007                            DOB:          Feb 09, 1946    PRIMARY CARDIOLOGIST:  Learta Codding, MD,FACC   REASON FOR VISIT:  Scheduled 66-month followup.   Since last seen here in the clinic in April, by Dr. Andee Lineman, the patient  continues to do well from a cardiovascular standpoint with no interim  development of angina pectoris.  She is now nearly one year out from a a  non-ST elevation myocardial infarction in late January, treated with  drug-eluting stenting of the left anterior descending artery.   The patient reports compliance with her medications, good blood pressure  control at home, and has no history of tobacco smoking.   At time of her last visit, she was placed on hydrochlorothiazide 25  daily for better blood pressure control.  This has resulted in  significant improvement, as noted by her current blood pressure reading.   The patient does report arthritis of the knees, but is exercising on a  regular basis with water aerobics at the [facility], three times a week.   Electrocardiogram today reveals first degree AV block at 81 bpm with a  normal axis; chronic Q-waves in lead V1 in V2 with a poor R-wave  progression; no acute changes.   CURRENT MEDICATIONS:  1. Plavix full dose aspirin.  2. Toprol XL 200 daily.  3. Norvasc 5 daily.  4. Allopurinol 100 daily.  5. Lisinopril 40 daily.  6. Metformin ER 500 b.i.d.  7. Vytorin 10/20 daily.  8. Fish oil 1000 daily.  9. Hydrochlorothiazide 25 daily.  10.Anaprox 500 daily.   PHYSICAL EXAMINATION:  Blood pressure 129/85, pulse 94, regular; weight  232.  GENERAL:  65 year old female sitting upright in no distress.  HEENT: Normocephalic, atraumatic.  NECK: Palpable bilateral carotid pulses without bruits. No  JVD at 90  degrees.  LUNGS:   Clear to auscultation in all fields.  HEART: Regular rate and rhythm (S1, S2). No significant murmurs.  ABDOMEN: Protuberant, nontender.  EXTREMITIES: No significant edema.  NEURO: No focal deficit.   IMPRESSION:  1. Multi-vessel coronary artery disease.      a.     Non-STEMI/TAXUS stenting 100% proximal LAD (x2), January       2008 .      b.     Residual 70% proximal circumflex; 80% proximal RCA.      c.     Mild LVD (ejection fraction 45%); anterior hypokinesis.  2. Type 2 diabetes mellitus.  3. Hypertension.  4. Dyslipidemia.      a.     Total cholesterol 145, triglyceride 196, HDL 38 and LDL 67,       June 2008.  5. Obesity.  6. First-degree AV block.  7. History of gout.   PLAN:  1. 2-D echocardiogram for reassessment of left ventricle function.  2. Down titrate aspirin initially to 162, with target goal of 81 mg  daily.  Continue Plavix for a full 2 years.  3. Schedule return clinic follow-up with myself and Dr. Andee Lineman in 6      months.      Gene Serpe, PA-C  Electronically Signed      Learta Codding, MD,FACC  Electronically Signed   GS/MedQ  DD: 08/26/2007  DT: 08/26/2007  Job #: [id number]   cc:   Laural Roes, [facility]  Fara [place]ute

## 2011-01-09 NOTE — Assessment & Plan Note (Signed)
[facility]                          [facility]   [name], [name]                        MRN:          [id number]  DATE:06/11/2008                            DOB:          [birth year]    PRIMARY CARDIOLOGIST:  Chelsea Codding, MD, [name]   REASON FOR VISIT:  A 57-month followup.   [name] returns to our clinic for scheduled followup, reporting no  interim development of signs/symptoms suggestive of unstable angina  pectoris.  She has, however, become plagued by persistent sciatic pain  of the right lower extremity.  This has recently been evaluated and she  is awaiting scheduling for lumbar laminectomy within the next week or  so.   EKG today reveals NSR at 89 bpm with normal axis and chronic changes in  the anteroseptal leads.   CURRENT MEDICATIONS:  1. Aspirin 162 daily.  2. Plavix.  3. Toprol-XL 200 daily.  4. Norvasc 5 daily.  5. Lisinopril 40 daily.  6. Vytorin 10/20 daily.  7. Hydrochlorothiazide 25 daily.  8. Metformin ER 500 b.i.d.  9. Allopurinol 100 daily.   PHYSICAL EXAMINATION:  VITAL SIGNS:  Blood pressure 141/92, pulse 91,  regular, weight 219.  GENERAL:  A 65 year old female, obese, sitting upright, in no distress.  HEENT:  Normocephalic and atraumatic.  NECK:  Palpable carotid pulses without bruits; no JVD.  LUNGS:  Clear to auscultation.  HEART:  Regular rate and rhythm.  Soft S4.  No significant murmurs.  ABDOMEN:  Protuberant, nontender.  EXTREMITIES:  No significant edema.  NEURO:  No focal deficit.   IMPRESSION:  1. Multivessel coronary artery disease, quiescent.      a.     Non-STEMI/Taxus stenting 100% left anterior descending (2),       January 2008.      b.     Residual 70% circumflex; 80% right coronary artery.      c.     Mild LVD (EF 45%); improved to 55-60% by 2-D echocardiogram,       January 2009.  2. Type 2 diabetes mellitus.  3. Hypertension.  4. Dyslipidemia.  5. Obesity.  6.  Herniated lumbar disk.   PLAN:  1. The patient is cleared from a cardiac standpoint to proceed with      lumbar surgery, as recommended.  She is at low risk for      perioperative complications from a cardiac standpoint.  She is now      one and a half years out from having undergone treatment with a      drug-eluting stent, and can come off Plavix for 5 days prior to her      scheduled procedure.  She will then need to resume this, once      cleared from a surgical standpoint.  2. Downtitrate aspirin to 81 mg daily, to be continued indefinitely.  3. Aggressive lipid management recommended with LDL target of 70 or      less, if possible.  4. P.r.n. nitroglycerin.  5. Schedule return clinic follow up with myself and Dr. Andee Bird in 6  months.      Chelsea Serpe, PA-C  Electronically Signed      Chelsea Codding, MD,FACC  Electronically Signed   GS/MedQ  DD: 06/11/2008  DT: 06/12/2008  Job #: [id number]   cc:   [name]

## 2011-01-12 NOTE — H&P (Signed)
[name], [name]               ACCOUNT NO.:  [id number]   MEDICAL RECORD NO.:  [id number]          PATIENT TYPE:  INP   LOCATION:  [facility]                          FACILITY:  [facility]   PHYSICIAN:  Skeet Latch, DO    DATE OF BIRTH:  1946/08/16   DATE OF ADMISSION:  09/26/2006  DATE OF DISCHARGE:  [facility]                              HISTORY & PHYSICAL   PRIMARY CARE PHYSICIAN:  The patient does not have a primary care  physician.   CHIEF COMPLAINT:  Chest pain.   HISTORY OF PRESENT ILLNESS:  This is a 65 year old African American  female who presents complaining of some chest discomfort.  The patient  states that this evening she experienced a sudden onset of chest  discomfort that was severe in nature, that when I examine her, she  points to her epigastric region.  The patient states this pain started,  lasted for a few minutes, then subsided.  The patient states she then  went to church and while coming home from church, the pain returned.  The patient states she went back to church and her pastor prayed for  her, then the patient left again.  The patient states then the pain  returned again and the patient decided to come to the ER for evaluation.  The patient states the pain is sharp in nature and was about an 8/10.  The patient's symptoms were persistent, but did not have any radiation.  Denied any nausea, vomiting, headache, lightheadedness or dizziness with  the pain.  The patient states at the time after the morphine the  symptoms have resolved.  The patient states she had a similar episode of  pain approximately 3-4 years ago and had a negative stress test.  The  patient said at that time they said it was heartburn and has not had any  pain until this evening.   PAST MEDICAL HISTORY:  1. Non-insulin-dependent diabetes.  2. Hypertension.  3. Gout.   PAST SURGICAL HISTORY:  1. Cholecystectomy.  2. Hysterectomy.  3. Breast biopsy.   SOCIAL HISTORY:  She denies any  tobacco, alcohol or illicit drug use.   ALLERGIES:  No known drug allergies.   REVIEW OF SYSTEMS:  HEENT:  Negative.  RESPIRATORY:  Negative for cough,  dyspnea or wheezing.  GI:  At this point, there is some epigastric pain  that has resolved.  GU:  Denies any urinary complaints.  MUSCULOSKELETAL:  Positive for some chronic back pain.  SKIN:  Negative.  NEUROLOGICAL:  Negative.  PSYCHIATRIC:  Negative.   MEDICATIONS:  1. The patient states she takes Celebrex daily.  2. She believes she takes Glucophage 500 mg once a day.  3. She takes Toprol-XL daily.  4. She is on a cholesterol medication and 2 other hypertensive      medicines that she cannot remember.   PHYSICAL EXAMINATION:  VITAL SIGNS:  Temperature is 97.8, blood pressure  is 139/79, pulse 65, respirations 18.  The patient is saturating 100%.  GENERAL:  The patient is pleasant, cooperative, appears stated age,  obese.  HEENT:  Head  is atraumatic and normocephalic.  Eyes:  PERRLA.  EOMI.  NECK:  Supple, nontender and non-distended.  No JVD.  No thyromegaly  appreciated.  CARDIOVASCULAR:  S1 and S2.  Regular rate and rhythm.  No rubs, gallops  or murmurs.  LUNGS:  Clear to auscultation bilaterally, no rales, rhonchi or  wheezing.  ABDOMEN:  Some epigastric tenderness on palpation.  Abdomen was soft and  non-distended.  Positive bowel sounds present.  EXTREMITIES:  No clubbing or cyanosis.  Has some trace edema.  NEUROLOGIC:  Cranial nerves II-XII are grossly intact.  The patient was  alert and oriented x3.   LABORATORY DATA:  PT 12.9, INR 1.0, PTT 35, D-dimer 0.27.  White blood  cell count 7.7, hemoglobin 13.8, hematocrit 41.2, platelets 274,000.  Sodium 132, potassium 3.6, chloride 99, CO2 22, glucose 164, BUN 13,  creatinine 1.05.  First troponin was 0.41; second set was 0.35, third  set 0.23.  Her urine was negative.   EKG showed normal sinus rhythm.   ASSESSMENT/PLAN:  1. Atypical chest pain.  The patient will be  admitted with diagnosis      of chest pain.  We will repeat cardiac enzymes q.6 h. x3, repeat      CBC, BMP, liver profile and TSH in the morning; also, I will repeat      her EKG.  We will get a Cardiology consult also.  The patient was      placed on heparin drip and we will continue for now.  The patient      was also placed on a nitroglycerin drip and we will switch to      Nitrol paste q.6 h.  The patient will be placed on deep venous      thrombosis as well as gastrointestinal prophylaxis with Lovenox and      Prevacid.  For now, we will place the patient on Toprol-XL 25 mg      and lisinopril 5 mg daily until her medications are reconciled.      The patient will be continued on morphine intravenously as well as      Zofran intravenously for nausea.  We will discontinue ultrasound of      gallbladder; the patient does not have a gallbladder.  2. Non-insulin-dependent diabetes.  The patient will be on a sliding-      scale insulin coverage for now until her medications are obtained.      She will get glucose checks before meals and at bedtime.  3. Hypertension.  The patient will be placed on lisinopril 5 mg also      until her medications are obtained.  As stated previously, we will      get a Cardiology consult due to the patient's risk factors of age,      weight and her diabetes.  There is a strong indication that this      may be gastrointestinal in nature, but due to the patient's medical      history, I feel cardiology workup needs to be followed as well as      her elevated troponins.  I will follow the patient closely.      Skeet Latch, DO  Electronically Signed     SM/MEDQ  D:  09/26/2006  T:  09/26/2006  Job:  [id number]

## 2011-01-12 NOTE — H&P (Signed)
NAMESEVYN, [name]               ACCOUNT NO.:  [id number]   MEDICAL RECORD NO.:  [id number]          PATIENT TYPE:  OUT   LOCATION:  CATH                         FACILITY:  [facility]   PHYSICIAN:  Everardo Beals. Juanda Chance, MD, FACCDATE OF BIRTH:  [birth year]   DATE OF ADMISSION:  09/26/2006  DATE OF DISCHARGE:                              HISTORY & PHYSICAL   PRIMARY CARE PHYSICIAN:  Primary care physician:  Dr. Feliz Beam,  [place].  Primary Cardiologist:  New, she will be seen by Dr.  Charlies Constable.   HISTORY OF PRESENT ILLNESS:  The patient is a 65 year old obese, African-  American female who began to experience chest pressure yesterday on  September 25, 2006, while caulking windows at home with her son.  The  patient described it as chest pressure and indigestion.  The patient  states that it lasted about 30 minutes and was nonradiating and she felt  some weakness associated with it.  The patient sat down and rested and  the pain subsided on its own.  A few hours later the patient went on to  church, went through services without any difficulty.  On her way home  from church the patient began to again feel substernal chest pain, more  severe than previously experienced in the day.  She also had associated  weakness and mild diaphoresis.  The patient went back into the church,  spoke with her preacher who prayed for her and then began to drive home  again as the pain had subsided.  The patient was having services in  [place] and was passing [facility] when the chest pain  recurred again at the same intensity.  She drove into the [facility] emergency room and was seen and examined by emergency room  physician there.  The physician's name was Dr. Greta Doom.  On  arrival, the patient was given nitroglycerin sublingual and also given  some morphine.  The patient had laboratory work completed.  Electrocardiogram revealed normal sinus rhythm without any acute  changes.  The patient was kept overnight for observation.  Point of Care  Troponin on arrival was 0.35, Point of Care myoglobin was 134 with CK-MB  of 2.1.  The patient's blood pressure on arrival was 136/76, pulse 72,  respirations 20 with temperature of 97.8.  The patient's pain subsided  after approximately 30 minutes on nitroglycerin.  Secondary to the  patient's symptoms, the patient was kept overnight for further  observation.  Subsequent troponin was found to be elevated at 0.23.  CK-  MB 3.2.  Secondary to these symptoms, the patient was transferred to  [facility] for cardiac catheterization, being accepted by Dr.  Juanda Chance.  During time in emergency room at [facility] the  patient was also given metoprolol 5 mg IV x1 and sublingual  nitroglycerin prior to being started on a heparin drip and nitroglycerin  drip.   The patient is seen and examined by Dr. Juanda Chance and myself in the holding  area at [facility] prior to catheterization.  The patient has  continued to experience chest discomfort but she describes it as a 1/10.  She is not short of breath or diaphoretic.  The patient has been  explained the need for cardiac catheterization with risks and benefits  per Dr. Juanda Chance and she verbalizes understanding and is willing to  proceed.   CURRENT MEDICATIONS:  Her current medications at home include:  1. Metoprolol 200 mg once a day.  2. Vytorin 10/20, once daily.  3. Celebrex 200 mg once a day.  4. Allopurinol 100 mg once a day.  5. Metformin 1,000 mg once a day.  6. Amlodipine 5 mg once a day.  7. Lisinopril 40 mg once a day.   ALLERGIES:  No known drug allergies.   PAST MEDICAL HISTORY:  1. Hypertension.  2. Gout.  3. Hypercholesterolemia.  4. Diabetes.   PAST SURGICAL HISTORY:  1. Cholecystectomy.  2. Tonsillectomy.  3. Rotator cuff repair on the right.  4. Hysterectomy.  5. Breast biopsy.   SOCIAL HISTORY:  She lives in [place] alone but her  son comes in and out  often.  She is retired.  She does not smoke. She does not drink alcohol.  She is watching her diet secondary to diabetes.   FAMILY HISTORY:  Unknown at present.   PHYSICAL EXAMINATION:  HEENT:  Head is normocephalic, atraumatic.  Eyes:  Pupils equal, round, reactive to light and accommodation. Mucous  membranes of mouth pink and moist.  Tongue is midline.  NECK:  Supple without jugular venous distention or carotid bruits  appreciated.  CARDIOVASCULAR:  Regular rate and rhythm without murmurs, rubs or  gallops.  LUNGS:  Clear to auscultation.  ABDOMEN:  Obese, nontender, 2+ bowel sounds.  EXTREMITIES:  Without cyanosis, clubbing or edema.  SKIN:  Warm and dry.  NEUROLOGICAL:  Intact.   IMPRESSION:  1. Non ST elevated myocardial infarction with chest pain and positive      troponin's.  2. Hypertension.  3. Diabetes.  4. Hypercholesterolemia.  5. Obesity.   PLAN:  The patient was seen and examined by myself and Dr. Charlies Constable  in the holding area of [facility] Catheterization Department.  The  patient will be moved for cardiac catheterization within minutes.  The  patient has been advised of risks and benefits concerning this procedure  and she is willing to proceed.  The patient will continue  on nitroglycerin and heparin with medication changes and adjustments per  Dr. Juanda Chance after catheterization.  More recommendations will be made  after catheterization is completed for need for intervention, medical  management or bypass grafting at Dr. Regino Schultze discretion.      [name]. Chelsea Bishop, NP      Everardo Beals. Juanda Chance, MD, [facility] [place]  Electronically Signed    KML/MEDQ  D:  09/26/2006  T:  09/26/2006  Job:  [id number]   cc:   Dr. Feliz Beam,  [place].

## 2011-01-12 NOTE — Cardiovascular Report (Signed)
[name], [name]               ACCOUNT NO.:  [id number]   MEDICAL RECORD NO.:  [id number]          PATIENT TYPE:  INP   LOCATION:  2914                         FACILITY:  [facility]   PHYSICIAN:  Everardo Beals. Juanda Chance, MD, FACCDATE OF BIRTH:  09-25-1945   DATE OF PROCEDURE:  09/25/2006  DATE OF DISCHARGE:  09/28/2006                            CARDIAC CATHETERIZATION   PROCEDURE:  [name] is a 65 year old who developed chest pain while  in church in [place].  She went to [facility], was kept  overnight and transferred to [facility].  Her troponins were positive  consistent with non-ST-elevation infarction.  She is scheduled for  evaluation with angiography.   PROCEDURE:  Left heart catheterization was performed percutaneously via  right femoral artery using arterial sheath and 6-French preformed  coronary catheters.  A front wall arterial puncture was performed and  Omnipaque contrast was used.  After completion of the diagnostic study,  I made a decision to proceed with intervention on the lesion in the left  anterior descending artery.   The patient was given Angiomax bolus and infusion and was given  clopidogrel and aspirin.  We used a FL 3.0 6-French guiding catheter  with side holes.  The lesion in the proximal LAD was completely occluded  and we were able to cross this with a Prowater wire without too much  difficulty.  There was a second stenosis 95% just distal to the total  occlusion once we opened the vessel.  We initially opened the vessel  with a 2.0 x 20-mm Maverick.  We initially deployed a 2.5 x 20-mm Taxus  stent in the proximal LAD, deploying this with one inflation up to 14  atmospheres for 30 seconds.  We then deployed a second Taxus stent which  was 2.75 x 16-mm overlapping the first stent.  We deployed this with 14  atmospheres for 30 seconds.  We then postdilated both stents with a 3.0  x 20-mm Quantum Maverick performing two inflations up to 16  atmospheres  for 30 seconds.  Intravascular ultrasound was performed with the  Atlantis IVUS catheter to assess the results.   RESULTS:  The left anterior descending artery:  Arose from the separate  ostium off the aorta and was totally occluded in its proximal portion.   The circumflex artery:  Rose from a separate ostium off the aorta and  gave rise to a large marginal branch and AV branch which supplied two  more marginal branches and two posterolateral branches.  There were  tandem 50-70% stenoses in the mid-circumflex artery and 40% stenosis in  the distal circumflex artery.  There was 50% narrowing in the first  large marginal branch.   The right coronary artery:  It was a small to moderate-sized vessel that  gave rise to a right ventricular branch and the second right ventricular  branch which supplied part of the inferior septum and a small short  posterior descending branch.  There was 70-80% stenosis in the proximal  right coronary artery.  The right coronary artery fed the distal LAD by  collaterals.  The left ventriculogram:  Performed in the RAO projection showed  hypokinesis of the anterolateral and apex.  The estimated ejection  fraction was 45%.   Following stenting of the LAD with two overlapping Taxus stents, the  stenosis improved from 100% to 0% and the flow improved from TIMI II 0  to TIMI III.   CONCLUSION:  1. Coronary artery disease with total occlusion left anterior      descending artery, 50% narrowing in the first marginal branch.  The      circumflex artery with 50-70% narrowing in the proximal circumflex      artery and 40% narrowing in the distal circumflex artery, 70-80%      stenosis in the proximal right coronary artery and anterolateral      wall and apical wall hypokinesis with an estimated fraction of 45%.  2. Successful percutaneous coronary intervention of the totally      occluded left anterior descending artery using two tandem       overlapping Taxus drug-eluting stents with improvement stenting      narrowing from 100% to 0%.   DISPOSITION:  The patient returned post anesthesia for further  observation.      Bruce Elvera Lennox Juanda Chance, MD, [facility]  Electronically Signed     BRB/MEDQ  D:  01/20/2007  T:  01/20/2007  Job:  [id number]

## 2011-01-12 NOTE — Assessment & Plan Note (Signed)
[facility]                          [facility] NOTE   NAME:Bird, Chelsea                        MRN:          [id number]  DATE:12/11/2006                            DOB:          01-09-1946    HISTORY OF PRESENT ILLNESS:  The patient is a 65 year old female with a  history of coronary artery disease, status post stent to the LAD in the  setting of non-ST elevation myocardial infarction in January 2008.  The  patient has been doing well.  She reports no recurrence of chest pain.  She has no shortness of breath.  Her lipids have been under good  control.  She states that she also thinks her blood pressure has been  under good control.  As a matter of fact, in the office her blood  pressure is markedly elevated with significant diastolic hypertension.  She denies any orthopnea or PND.  She has no palpitations or syncope.   MEDICATIONS:  1. Aspirin 325 mg p.o. daily.  2. Plavix 75 mg p.o. daily.  3. Toprol XL 200 mg p.o. daily.  4. Vytorin 10/40 q.p.m.  5. Allopurinol 100 mg p.o. daily.  6. Norvasc 5 mg p.o. daily.  7. Lisinopril 40 mg p.o. daily.  8. Metformin ER 1000 mg p.o. daily.   PHYSICAL EXAMINATION:  GENERAL APPEARANCE:  An overweight African  American female in no apparent distress.  VITAL SIGNS:  Blood pressure 161/95, heart rate 75 beats per minute,  weight 234 pounds.  NECK:  Normal carotid bruits.  LUNGS:  Clear.  CARDIOVASCULAR:  Regular rate and rhythm, normal S1 and S2.  No murmurs,  rubs, or gallops.  ABDOMEN:  Soft.  EXTREMITIES:  No clubbing, cyanosis, or edema.  NEUROLOGIC:  Patient alert and oriented, grossly nonfocal.   PROBLEM LIST:  1. Status post non-ST segment elevation myocardial infarction.      a.     Status post Taxus stent to the left anterior descending.      b.     Residual 70% circumflex, 80% proximal right coronary artery       disease.      c.     Mild left ventricular dysfunction, ejection  fraction 45%.  2. Type 2 diabetes mellitus.  3. Hypertension.  4. Dyslipidemia.  5. Obesity.  6. Gout.   PLAN:  1. The patient's blood pressure is poorly controlled.  We have added      hydrochlorothiazide to      her medical regimen.  2. The patient will follow up with [place] in six months.  At that time, we      will reassess her ejection fraction.     Learta Codding, MD,FACC  Electronically Signed    GED/MedQ  DD: 12/11/2006  DT: 12/11/2006  Job #: [id number]

## 2011-01-12 NOTE — Assessment & Plan Note (Signed)
[facility]                          [facility] NOTE   Chelsea, Bird                        MRN:          [id number]  DATE:10/11/2006                            DOB:          [birth year]    PRIMARY CARDIOLOGIST:  Dr. Lewayne Bunting (new).   REASON FOR VISIT:  [name] is a 65 year old female, with no prior  history of coronary artery disease, who was recently hospitalized at  [facility] with non ST elevation myocardial infarction. She initially  presented to [facility] ER with recurrent chest pain and was found to  have abnormal cardiac markers with a peak troponin of 0.4.   Cardiac catheterization revealed 100% occlusion of the proximal LAD  followed by 95% distal lesion. Residual anatomy notable for a 70%  proximal CFX and 80% proximal RCA disease. LV function was mildly  depressed (EF 45%) with anterior hypokinesis.   The patient underwent successful TAXUS stenting of the LAD (x2), with no  noted complications. She was discharged on hospital day #2.   Since her release, she has had no recurrent angina pectoris and has  resumed walking on a regular basis, with no associated chest discomfort.  She is compliant with her medications, including Plavix, and reports no  adverse effects.   The patient has no history of tobacco smoking.   Electrocardiogram  today reveals NSR at 71 BPM with normal axis and Q  waves in leads V1 and V2 with associated T wave inversion and T wave  inversion in AVL.   CURRENT MEDICATIONS:  1. Plavix.  2. Aspirin 325.  3. Toprol XL 200 daily.  4. Vytorin 10/40 daily.  5. Allopurinol 100 daily.  6. Norvasc 5 daily.  7. Lisinopril 40 daily.  8. Metformin 1000 daily.   PHYSICAL EXAMINATION:  VITAL SIGNS:  Blood pressure 135/87, pulse 83,  regular, weight 235.  GENERAL:  A 65 year old female sitting upright in no distress.  HEENT:  Normocephalic, atraumatic.  NECK:  Palpable bilateral carotid pulses  without bruits; no JVD.  LUNGS:  Clear to auscultation in all fields.  HEART:  Regular rate and rhythm (S1, S2). No murmurs, rubs or gallops.  ABDOMEN:  Soft, nontender.  EXTREMITIES:  Palpable right femoral pulse with no ecchymosis, hematoma,  or bruit on auscultation; intact distal pulse with no significant edema.  NEUROLOGIC:  No focal deficit.   IMPRESSION:  1. Non ST elevation myocardial infarction.      a.     Status post TAXUS stenting 100% proximal left anterior       descending January 2008.      b.     Residual 70% proximal CFX and 80% proximal right coronary       artery disease.      c.     Mild left ventricular dysfunction (ejection fraction 45%)       with anterior hypokinesis.  2. Type 2 diabetes mellitus.  3. Hypertension.  4. Hyperlipidemia.  5. Obesity.  6. History of gout.   PLAN:  1. Continue current medication regimen, including Plavix for at least  2 years.  2. Check a fasting lipid/liver profile in 2 months for reassessment of      lipid status.  3. The patient instructed to resume driving, but to not return to a      regular exercise program pending followup clinic visit.  4. Patient instructed to not resume Celebrex for treatment of her knee      arthritis, given that she is on both aspirin and Plavix.  5. Schedule return clinic followup with myself and Dr. Andee Lineman in 2      months.      Chelsea Serpe, PA-C  Electronically Signed      Learta Codding, MD,FACC  Electronically Signed   GS/MedQ  DD: 10/11/2006  DT: 10/11/2006  Job #: [id number]   cc:   Fara Chute

## 2011-01-12 NOTE — Discharge Summary (Signed)
[name], [name]               ACCOUNT NO.:  [id number]   MEDICAL RECORD NO.:  [id number]          PATIENT TYPE:  INP   LOCATION:  2914                         FACILITY:  [facility]   PHYSICIAN:  Bevelyn Buckles. Bensimhon, MDDATE OF BIRTH:  12-02-1945   DATE OF ADMISSION:  09/26/2006  DATE OF DISCHARGE:  09/28/2006                               DISCHARGE SUMMARY   PRINCIPAL DIAGNOSIS:  Non-ST-elevation myocardial infarction/coronary  artery disease.   SECONDARY DIAGNOSES:  1. Hypertension.  2. Hyperlipidemia.  3. Type 2 diabetes mellitus.  4. Obesity.  5. Gout.  6. Status post cholecystectomy, status post tonsillectomy, status post      right rotator cuff repair, status post hysterectomy, status post      breast biopsy.   ALLERGIES:  No known drug allergies.   PROCEDURES:  Left heart cardiac catheterization with successful  percutaneous coronary intervention and stenting of a totally occluded  proximal left anterior descending with placement of a 2.5 x 20 mm and a  2.75 x 16 mm Taxus drug eluting stent   HISTORY OF PRESENT ILLNESS:  65 year old obese African American female  with no prior history of coronary artery disease who was in usual state  of health until September 25, 2006, when she developed exertional chest  pressure lasting approximately 30 minutes associated with weakness and  resolving spontaneously.  She had recurrent symptoms x2 on her way home  from church and she drove to [facility] where she was seen in  the ED and was treated with sublingual nitroglycerine, morphine, and  beta blocker, and was noted to have an elevated troponin at 0.35.  She  was transferred from [facility] to [facility] for  further evaluation.   HOSPITAL COURSE:  The patient ruled in for non-ST-elevation MI with a  peak CK of 907, MB of 80.1, and troponin I of 9.66.  She was taken to  the cardiac cath lab on January 30.  Catheterization revealed a totally  occluded  proximal LAD with a 95% stenosis just distal to that.  She had  50-70% stenosis throughout the proximal circumflex and a 70-80% stenosis  in the proximal RCA.  EF was 45% with anterior hypokinesis.  The LAD was  obviously felt to be the culprit lesion.  This was successfully stented  with two Taxus drug eluting stents as outlined above.  She tolerated  this procedure well and was monitored in the coronary intensive care  unit post procedure.  She has been seen by cardiac rehab and has been  ambulating without recurrent discomfort or limitations.  She is being  discharged home today in satisfactory condition.   DISCHARGE LABS:  Hemoglobin 13, hematocrit 38.3, WBC 8.5, platelet 240.  Sodium 135, potassium 3.8, chloride 99, CO2 24, BUN 7, creatinine 1.11,  glucose 160, total bilirubin 0.7, alkaline phosphatase 57, AST 89, ALT  51, total protein 6.7, albumin 3.9, calcium 8.6. Hemoglobin A1c 7.4.  CK  37, MB 70.1, troponin I 9.66.  BNP less than 30. Total cholesterol 123,  triglycerides 114, HDL 30, LDL 70.  TSH 1.392.  Urinalysis was negative.   DISPOSITION:  The patient is being discharged home today in good  condition.   FOLLOW-UP PLANS AND APPOINTMENTS:  Our [place] office will contact her for  follow up in approximately two weeks with Dr. Andee Lineman.  She has follow up  with her primary care physician as previously scheduled.   DISCHARGE MEDICATIONS:  Aspirin 325 mg daily, Plavix 75 mg daily, Toprol  XL 200 mg daily, Vytorin 10/40 mg q.p.m., allopurinol 100 mg daily,  Norvasc 5 mg daily, lisinopril 40 mg daily, metformin ER 1000 mg daily,  nitroglycerin 0.4 mg sublingual p.r.n. chest pain.   OUTSTANDING LAB STUDIES:  None.   DURATION DISCHARGE ENCOUNTER:  45 minutes including physician time.      [name], ANP      Bevelyn Buckles. Bensimhon, MD  Electronically Signed    CB/MEDQ  D:  09/28/2006  T:  09/28/2006  Job:  [id number]   cc:   Learta Codding, MD,FACC  Dr. Kandice Robinsons

## 2011-01-12 NOTE — Discharge Summary (Signed)
NAMEASIANA, BENNINGER               ACCOUNT NO.:  [id number]   MEDICAL RECORD NO.:  [id number]          PATIENT TYPE:  INP   LOCATION:  ED99                          FACILITY:  [facility]   PHYSICIAN:  Osvaldo Shipper, MD     DATE OF BIRTH:  1946/02/28   DATE OF ADMISSION:  09/25/2006  DATE OF DISCHARGE:  01/31/2008LH                               DISCHARGE SUMMARY   This is actually a transfer summary. The patient was transferred to  [facility].   TRANSFER DIAGNOSES:  1. Acute coronary syndrome, likely non-ST elevation myocardial      infarction.  2. History of type 2 diabetes.  3. History of hypertension.  4. Morbid obesity.   Please review H&P dictated by Dr. Lilian Kapur for details regarding  patient's presenting illness.   BRIEF HOSPITAL COURSE:  Briefly, this patient is a 65 year old African-  American female with diabetes and hypertension who presented with chest  pain and did not have any significant EKG changes on presentation. The  patient had mild elevation of the troponins with normal CK levels. The  patient was observed in the hospital and was to be seen by cardiology in  the daytime. However, this morning I was called saying that the patient  was having recurrence of her chest pain and that there were some dynamic  EKG changes. On approaching the bedside, the patient was complaining of  7/10 chest pain. She pointed to the retrosternal region. Denied any  shortness of breath. Her vital signs showed slightly hypertensive range  blood pressures; however, her heart rate was okay. The review of the EKG  showed that she was developing some Q waves in lead III and her T wave  had flipped in the same lead. I was also informed that when the patient  was put on nitroglycerin she became hypertensive. All of these suggested  that the patient probably had some inferior, maybe posterior wall  involvement. However, I did not notice any ST-segment changes as such.  Review  of her labs showed that her cardiac markers has significantly  changed from last reading. Her CK jumped up to 482 with a MB of 38, and  her troponin jumped up to 3.07. Based on all of the above, it was felt  that the patient was now having acute coronary syndrome, possibly non-ST  elevation MI. I discussed this case with Dr. Antoine Poche from [facility]  Cardiology who accepted this patient in transfer. The patient was  continued on IV heparin. Beta blockers were given through the IV. She  was already given aspirin earlier last night. Statin was also  prescribed. Dr. Antoine Poche told me to hold off on the Integrilin for now,  and said she would probably get it after her catheterization.   I spent about one hour taking care of this patient and helping in  transfer to [facility]. This will be counted towards  critical care time.      Osvaldo Shipper, MD  Electronically Signed     GK/MEDQ  D:  09/26/2006  T:  09/26/2006  Job:  [id number]   cc:   Nicoletta Dress. Colon Branch, M.D.  Fax: [phone]

## 2011-03-29 ENCOUNTER — Other Ambulatory Visit: Payer: Self-pay | Admitting: Neurosurgery

## 2011-03-29 DIAGNOSIS — R531 Weakness: Secondary | ICD-10-CM

## 2011-03-29 DIAGNOSIS — R200 Anesthesia of skin: Secondary | ICD-10-CM

## 2011-03-29 DIAGNOSIS — M79604 Pain in right leg: Secondary | ICD-10-CM

## 2011-03-29 DIAGNOSIS — M545 Low back pain, unspecified: Secondary | ICD-10-CM

## 2011-04-02 ENCOUNTER — Ambulatory Visit
Admission: RE | Admit: 2011-04-02 | Discharge: 2011-04-02 | Disposition: A | Discharge status: Home / Self Care | Payer: Medicare Other | Source: Ambulatory Visit | Attending: Neurosurgery | Admitting: Neurosurgery

## 2011-04-02 DIAGNOSIS — M79604 Pain in right leg: Secondary | ICD-10-CM

## 2011-04-02 DIAGNOSIS — R200 Anesthesia of skin: Secondary | ICD-10-CM

## 2011-04-02 DIAGNOSIS — R531 Weakness: Secondary | ICD-10-CM

## 2011-04-02 DIAGNOSIS — M545 Low back pain, unspecified: Secondary | ICD-10-CM

## 2011-04-02 MED ORDER — GADOBENATE DIMEGLUMINE 529 MG/ML IV SOLN
20.0000 mL | Freq: Once | INTRAVENOUS | Status: AC | PRN
  Administered 2011-04-02: 20 mL via INTRAVENOUS

## 2011-08-09 ENCOUNTER — Telehealth: Payer: Self-pay | Admitting: *Deleted

## 2011-08-09 NOTE — Telephone Encounter (Signed)
Left message on voice mail that she had incident with her heart & needed appointment.  Returned call - stated she episode on Saturday where she took the trash out & had some chest pain on the way back.  Stated that she did have to take her Ntg x 1 which did relieve symptoms.  States she does not have any active chest pain now, but does have worsening fatique that is concerning.  Scheduled OV with Dr. Kirke Corin for 12/20.  Advised to go to ED if symptoms worsen.  She verbalized understanding.

## 2011-08-14 ENCOUNTER — Encounter: Payer: Self-pay | Admitting: *Deleted

## 2011-08-16 ENCOUNTER — Ambulatory Visit (INDEPENDENT_AMBULATORY_CARE_PROVIDER_SITE_OTHER): Payer: Medicare Other | Admitting: Physician Assistant

## 2011-08-16 ENCOUNTER — Encounter: Payer: Self-pay | Admitting: Physician Assistant

## 2011-08-16 ENCOUNTER — Encounter: Payer: Self-pay | Admitting: *Deleted

## 2011-08-16 VITALS — BP 120/82 | HR 94 | Ht 63.0 in | Wt 220.0 lb

## 2011-08-16 DIAGNOSIS — I259 Chronic ischemic heart disease, unspecified: Secondary | ICD-10-CM

## 2011-08-16 DIAGNOSIS — I10 Essential (primary) hypertension: Secondary | ICD-10-CM

## 2011-08-16 DIAGNOSIS — I2510 Atherosclerotic heart disease of native coronary artery without angina pectoris: Secondary | ICD-10-CM

## 2011-08-16 DIAGNOSIS — E119 Type 2 diabetes mellitus without complications: Secondary | ICD-10-CM

## 2011-08-16 DIAGNOSIS — R072 Precordial pain: Secondary | ICD-10-CM

## 2011-08-16 DIAGNOSIS — E785 Hyperlipidemia, unspecified: Secondary | ICD-10-CM

## 2011-08-16 DIAGNOSIS — R0602 Shortness of breath: Secondary | ICD-10-CM

## 2011-08-16 MED ORDER — NITROGLYCERIN 0.4 MG SL SUBL: 0.4000 mg | SUBLINGUAL_TABLET | SUBLINGUAL | Status: DC | PRN

## 2011-08-16 NOTE — Progress Notes (Deleted)
HPI:  No Known Allergies  Current Outpatient Prescriptions  Medication Sig Dispense Refill  . allopurinol (ZYLOPRIM) 100 MG tablet Take 100 mg by mouth daily.        [name] amLODipine (NORVASC) 5 MG tablet Take 5 mg by mouth daily.        [name] aspirin 325 MG tablet Take 325 mg by mouth daily.        . Cholecalciferol (VITAMIN D3) 1000 UNITS CAPS Take 1 capsule by mouth daily.        [name] ezetimibe-simvastatin (VYTORIN) 10-20 MG per tablet Take 1 tablet by mouth at bedtime.        . hydrochlorothiazide 25 MG tablet Take 1 tablet (25 mg total) by mouth daily.  90 tablet  3  . HYDROcodone-acetaminophen (VICODIN) 5-500 MG per tablet Take 1 tablet by mouth every 4 (four) hours as needed.        [name] lisinopril (PRINIVIL,ZESTRIL) 40 MG tablet Take 40 mg by mouth daily.        . metFORMIN (GLUCOPHAGE) 500 MG tablet Take 1,000 mg by mouth 2 (two) times daily with a meal.        . metoprolol (TOPROL-XL) 200 MG 24 hr tablet Take 200 mg by mouth daily.        . nitroGLYCERIN (NITROSTAT) 0.4 MG SL tablet Place 0.4 mg under the tongue every 5 (five) minutes as needed.        . Omega-3 Fatty Acids (FISH OIL) 1000 MG CAPS Take 1 capsule by mouth daily.          Past Medical History  Diagnosis Date  . CAD (coronary artery disease)     Non-STEMI/Taxus stenting 100% left anterior descending (2), January 2008.  Residual 70% circumflex;80% right coronary artery.  Mild LVD (EF 45%);improved to 55-60%, by 2-D echocardiogram, January 2009.   [name] Left ventricular dysfunction     mild,Cardiolite 2011 low risk with small anterior defect consistent with attenuation artifact.  Normal LV function.  [name] Unspecified essential hypertension   . Dyslipidemia   . Obesity   . Lumbar herniated disc   . AV block, 1st degree   . Gout   . Type 2 diabetes mellitus     History   Social History  . Marital Status: Legally Separated    Spouse Name: N/A    Number of Children: N/A  . Years of Education: N/A   Occupational History  .  Not on file.   Social History Main Topics  . Smoking status: Never Smoker   . Smokeless tobacco: Never Used  . Alcohol Use: Not on file  . Drug Use: Not on file  . Sexually Active: Not on file   Other Topics Concern  . Not on file   Social History Narrative  . No narrative on file    No family history on file.  ROS: no nausea, vomiting; no fever, chills; no melena, hematochezia; no claudication  PHYSICAL EXAM:  There were no vitals taken for this visit. GENERAL: well-nourished, well-developed; NAD HEENT: NCAT, PERRLA, EOMI; sclera clear; no xanthelasma NECK: palpable bilateral carotid pulses, no bruits; no JVD; no TM LUNGS: CTA bilaterally CARDIAC: RRR (S1, S2); no significant murmurs; no rubs or gallops ABDOMEN: soft, non-tender; intact BS EXTREMETIES: intact distal pulses; no significant peripheral edema SKIN: warm/dry; no obvious rash/lesions MUSCULOSKELETAL: no joint deformity NEURO: no focal deficit; NL affect   EKG: reviewed and available in Electronic Records   ASSESSMENT & PLAN:

## 2011-08-16 NOTE — Assessment & Plan Note (Signed)
Metformin to be held, on scheduled date of procedure. Patient will be covered with SSI regimen.

## 2011-08-16 NOTE — Assessment & Plan Note (Signed)
Recommendation is to proceed with an aggressive evaluation, for symptoms worrisome for probable CAD progression. Therefore, we will arrange for a diagnostic coronary angiography next week, with Dr. Kirke Corin, who approved of this plan. The patient is agreeable with this recommendation, and requested that it be scheduled after Christmas. The risks/benefits of the procedure were discussed, in conjunction with Dr. Kirke Corin. Regarding medications, she is on a very good regimen, which will be continued, save for metformin, to be held on scheduled date of procedure. Plavix will not be added, given increased likelihood of significant multivessel CAD, as previously documented.

## 2011-08-16 NOTE — Assessment & Plan Note (Signed)
Well-controlled on current medication regimen 

## 2011-08-16 NOTE — Progress Notes (Signed)
HPI: Patient presents for evaluation of recent episode of exertional CP, with known CAD. She was last seen here in clinic in June 2011, by Dr. Andee Lineman. Her last ischemic evaluation was a surveillance [employer], June 2011, which yielded a small area of anterior apical ischemia (vs breast attenuation); EF 61%. However, patient denied any exertional CP or SOB, and no further workup was recommended.  Patient experienced significant exertional CP back in June of this year, after climbing a full flight of stairs. She took 1 NTG tablet, with complete resolution after about 30 minutes. Her second episode occurred this past Saturday, after walking up a slight incline and a small number of steps. She had a similar sensation of anterior chest "pressure", somewhat worse than the episode back in June, again resolving completely after 1 NTG tablet, within 30 minutes. In both cases, there was no radiation to the jaw or upper extremities, nor any associated diaphoresis or nausea. She did experience exertional dyspnea.  Patient has not had any recurrent CP, since her most recent episode this past Saturday. However, she has remained cautious, and has refrained from any strenuous activity. Both cases are reminiscent of her NST EMI presentation in 2008, but not quite as severe. At that time, she was successfully treated with Taxus DES of a 100% occluded LAD(x2). There was residual 70% CFX and 80% RCA disease; EF 45%.  No Known Allergies  Current Outpatient Prescriptions  Medication Sig Dispense Refill  . allopurinol (ZYLOPRIM) 100 MG tablet Take 100 mg by mouth daily.        [name] amLODipine (NORVASC) 5 MG tablet Take 5 mg by mouth daily.        [name] aspirin 325 MG tablet Take 325 mg by mouth daily.        . B Complex-C (SUPER B COMPLEX PO) Take 1 tablet by mouth daily.        . hydrochlorothiazide 25 MG tablet Take 1 tablet (25 mg total) by mouth daily.  90 tablet  3  . lisinopril (PRINIVIL,ZESTRIL) 40 MG tablet Take  40 mg by mouth daily.        . metFORMIN (GLUCOPHAGE) 500 MG tablet Take 1,000 mg by mouth 2 (two) times daily with a meal.        . metoprolol (TOPROL-XL) 200 MG 24 hr tablet Take 200 mg by mouth daily.        . nitroGLYCERIN (NITROSTAT) 0.4 MG SL tablet Place 0.4 mg under the tongue every 5 (five) minutes as needed.        . Omega-3 Fatty Acids (FISH OIL) 1000 MG CAPS Take 1 capsule by mouth daily.        . pravastatin (PRAVACHOL) 40 MG tablet Take 1 tablet by mouth Daily.      . vitamin E 400 UNIT capsule Take 400 Units by mouth daily.          Past Medical History  Diagnosis Date  . CAD (coronary artery disease)     Non-STEMI/Taxus stenting 100% left anterior descending (2), January 2008. Residual 70% circumflex;80% right coronary artery. Mild LVD (EF 45%);improved to 55-60% by 2-D echocardiogram,January 2009.   [name] Unspecified essential hypertension   . Dyslipidemia   . Obesity   . Lumbar herniated disc   . AV block, 1st degree   . DM (diabetes mellitus)   . Gout     History   Social History  . Marital Status: Legally Separated    Spouse Name: N/A  Number of Children: N/A  . Years of Education: N/A   Occupational History  . Not on file.   Social History Main Topics  . Smoking status: Never Smoker   . Smokeless tobacco: Never Used  . Alcohol Use: Not on file  . Drug Use: Not on file  . Sexually Active: Not on file   Other Topics Concern  . Not on file   Social History Narrative  . No narrative on file    Family History  Problem Relation Age of Onset  . Heart attack Sister   . Heart attack Brother     ROS: no nausea, vomiting; no fever, chills; no melena, hematochezia; complains of bilateral thigh claudication (L > R); denies reflux symptoms. Remaining systems reviewed, and are negative.  PHYSICAL EXAM:  BP 120/82  Pulse 94  Ht 5\' 3"  (1.6 m)  Wt 220 lb (99.791 kg)  BMI 38.97 kg/m2 GENERAL: 65 year old female, morbidly obese, sitting upright;  NAD HEENT: NCAT, PERRLA, EOMI; sclera clear; no xanthelasma NECK: palpable bilateral carotid pulses, no bruits; no JVD; no TM LUNGS: CTA bilaterally CARDIAC: RRR (S1, S2); no significant murmurs; no rubs or gallops ABDOMEN: Protuberant; soft, non-tender; intact BS EXTREMETIES: intact bilateral femoral pulses, with soft left femoral bruit; no significant peripheral edema; minimally palpable PPs SKIN: warm/dry; no obvious rash/lesions MUSCULOSKELETAL: no joint deformity NEURO: no focal deficit; NL affect   EKG: reviewed and available in Electronic Records   ASSESSMENT & PLAN:

## 2011-08-16 NOTE — Progress Notes (Signed)
Addended by: Prescott Parma C on: 08/16/2011 02:58 PM   Modules accepted: Orders

## 2011-08-16 NOTE — Assessment & Plan Note (Signed)
Continue current dose of pravastatin. LDL target 70 or less, if feasible.

## 2011-08-16 NOTE — Patient Instructions (Signed)
   Nitroglycerin-Place one tablet under tongue every 5 minutes up to 3 doses as needed for chest pain. No more than 3 doses over a 15 minute period.  Your physician has requested that you have a cardiac catheterization. Cardiac catheterization is used to diagnose and/or treat various heart conditions. Doctors may recommend this procedure for a number of different reasons. The most common reason is to evaluate chest pain. Chest pain can be a symptom of coronary artery disease (CAD), and cardiac catheterization can show whether plaque is narrowing or blocking your heart's arteries. This procedure is also used to evaluate the valves, as well as measure the blood flow and oxygen levels in different parts of your heart. For further information please visit [email or web address] Please follow instruction sheet, as given. Your physician recommends that you go to the [facility] for lab work/chest x-ray: TODAY

## 2011-08-17 LAB — PROTIME-INR

## 2011-08-17 NOTE — Progress Notes (Signed)
I have seen and examined the patient. I agree with the above note. Progressive symptoms suggestive of angina with known history of coronary artery disease. Cardiac catheterization and possible PCI were recommended. All her questions were answered.  [name] 08/16/2011 5:30 PM

## 2011-08-22 ENCOUNTER — Encounter (HOSPITAL_BASED_OUTPATIENT_CLINIC_OR_DEPARTMENT_OTHER)
Admission: RE | Disposition: A | Discharge status: Home / Self Care | Payer: Self-pay | Source: Ambulatory Visit | Attending: Cardiovascular Disease

## 2011-08-22 ENCOUNTER — Inpatient Hospital Stay (HOSPITAL_BASED_OUTPATIENT_CLINIC_OR_DEPARTMENT_OTHER)
Admission: RE | Admit: 2011-08-22 | Discharge: 2011-08-22 | Disposition: A | Discharge status: Home / Self Care | Payer: Medicare Other | Source: Ambulatory Visit | Attending: Cardiovascular Disease | Admitting: Cardiovascular Disease

## 2011-08-22 DIAGNOSIS — Z9861 Coronary angioplasty status: Secondary | ICD-10-CM | POA: Insufficient documentation

## 2011-08-22 DIAGNOSIS — I2510 Atherosclerotic heart disease of native coronary artery without angina pectoris: Secondary | ICD-10-CM

## 2011-08-22 DIAGNOSIS — E119 Type 2 diabetes mellitus without complications: Secondary | ICD-10-CM | POA: Insufficient documentation

## 2011-08-22 DIAGNOSIS — R079 Chest pain, unspecified: Secondary | ICD-10-CM | POA: Insufficient documentation

## 2011-08-22 LAB — POCT I-STAT GLUCOSE
Glucose, Bld: 133 mg/dL — ABNORMAL HIGH (ref 70–99)
Operator id: 122531

## 2011-08-22 SURGERY — JV LEFT HEART CATHETERIZATION WITH CORONARY ANGIOGRAM
Anesthesia: Moderate Sedation

## 2011-08-22 MED ORDER — SODIUM CHLORIDE 0.9 % IV SOLN
INTRAVENOUS | Status: DC
  Administered 2011-08-22: 13:00:00 via INTRAVENOUS

## 2011-08-22 MED ORDER — ONDANSETRON HCL 4 MG/2ML IJ SOLN: 4.0000 mg | Freq: Four times a day (QID) | INTRAMUSCULAR | Status: DC | PRN

## 2011-08-22 MED ORDER — SODIUM CHLORIDE 0.9 % IV SOLN
INTRAVENOUS | Status: DC
  Administered 2011-08-22: 10:00:00 via INTRAVENOUS

## 2011-08-22 MED ORDER — ACETAMINOPHEN 325 MG PO TABS: 650.0000 mg | ORAL_TABLET | ORAL | Status: DC | PRN

## 2011-08-22 MED ORDER — SODIUM CHLORIDE 0.9 % IV SOLN: INTRAVENOUS | Status: DC

## 2011-08-22 MED ORDER — INSULIN ASPART 100 UNIT/ML ~~LOC~~ SOLN: 0.0000 [IU] | SUBCUTANEOUS | Status: DC

## 2011-08-22 MED ORDER — DIAZEPAM 5 MG PO TABS
5.0000 mg | ORAL_TABLET | ORAL | Status: AC
  Administered 2011-08-22: 5 mg via ORAL

## 2011-08-22 NOTE — Op Note (Signed)
Cardiac Catheterization Procedure Note  Name: [name] MRN: [id number] DOB: [birth year]  Procedure: Left Heart Cath, Selective Coronary Angiography, LV angiography  Indication: This is a 65 year old female with known history of coronary artery disease status post angioplasty and 2 drug-eluting stent placements to the left anterior descending artery in 2008. She presented with progressive symptoms of exertional chest pain which was worrisome for angina. Cardiac catheterization was thus recommended.   Medications:  Sedation:  1 mg IV Versed, 25 mcg IV Fentanyl  Contrast:  160 Omnipaque  Procedural details: The right groin was prepped, draped, and anesthetized with 1% lidocaine. Using modified Seldinger technique, a 4 French sheath was introduced into the right femoral artery. Standard Judkins catheters were used for coronary angiography and left ventriculography. However, I could not engage the left anterior descending artery which has a separate ostium from the left circumflex in spite of trying multiple catheters including JL4, JL 3.5, AL-1, AL2 and an AR-1. This artery was previously engaged with JL 3.0 catheter which we did not have in a 4 [place] system. Thus, I up sized sheath to a 5 [place] and used a JL 3.0 catheter. Nonselective angiography was performed and it appears that the LAD is occluded. Catheter exchanges were performed over a guidewire. There were no immediate procedural complications. The patient was transferred to the post catheterization recovery area for further monitoring.   Procedural Findings:  Hemodynamics: AO:  126/66   mmHg LV:  134/18    mmHg LVEDP: 23  MmHg  Mild gradient across the aortic valve.  Coronary angiography: Coronary dominance: Codominant.   Left Main: The LAD and circumflex originated from separate ostia.   Left Anterior Descending (LAD):  The vessel is occluded at the ostium. 2 overlapped stents are noted in the proximal segment. The vessel  fills via collaterals from the RCA as well as some collaterals from the left circumflex to the diagonals.  Circumflex (LCx):  The vessel is normal in size. 30% stenosis is noted right after giving OM1. In the midsegment there is also 2 separate lesions each causing about 60% stenosis.  1st obtuse marginal:  Overall large with a tubular 70% stenosis proximally.  2nd obtuse marginal:  Small in size and free of significant disease.  3rd obtuse marginal:  Small in size and free of significant disease.   Right Coronary Artery: The vessel is normal in size. There is an 80% stenosis in the midsegment right after giving RV 1. There is diffuse 20% disease in the midsegment.   posterior descending artery: Normal in size and free of significant disease. It gives collaterals to the left anterior descending artery. Left ventriculography: Left ventricular systolic function is mildly reduced , LVEF is estimated at 40-45 %, there is no significant mitral regurgitation . There is moderate anterior wall hypokinesis as well as dyskinesias of the apex.  Final Conclusions:   1. Significant three-vessel coronary artery disease with an occluded left anterior descending artery where 2 previous drug-eluting stents were placed.  2. Mildly reduced LV systolic function with an estimated ejection fraction of 40-45%. 3. Mild to moderate elevation in left ventricular end-diastolic pressure. 4. mild gradient across the aortic valve likely due to mild degree of stenosis.  Recommendations: I recommend CABG. Given her three-vessel coronary artery disease including an occluded left anterior descending artery with 2 previous drug-eluting stents were placed, mildly reduced LV systolic function and known history of diabetes, the patient will get the best long-term outcome from surgical revascularization versus  PCI. An echocardiogram will be requested before the surgery.  Lorine Bears MD, [facility] 08/22/2011, 11:59 AM

## 2011-08-22 NOTE — OR Nursing (Signed)
Tegaderm dressing applied, site intact, level 0, no bleeding, bedrest begins at 1210.

## 2011-08-22 NOTE — Interval H&P Note (Signed)
History and Physical Interval Note:  08/22/2011 11:07 AM  [name]  has presented today for surgery, with the diagnosis of cp  The various methods of treatment have been discussed with the patient. After consideration of risks, benefits and other options for treatment, the patient has consented to  Procedure(s): JV LEFT HEART CATHETERIZATION WITH CORONARY ANGIOGRAM as a surgical intervention .  The patients' history has been reviewed, patient examined, no change in status, stable for surgery.  I have reviewed the patients' chart and labs.  Questions were answered to the patient's satisfaction.     Lorine Bears

## 2011-08-22 NOTE — H&P (View-Only) (Signed)
HPI: Patient presents for evaluation of recent episode of exertional CP, with known CAD. She was last seen here in clinic in June 2011, by Dr. DeGent. Her last ischemic evaluation was a surveillance [employer] Cardiolite, June 2011, which yielded a small area of anterior apical ischemia (vs breast attenuation); EF 61%. However, patient denied any exertional CP or SOB, and no further workup was recommended.  Patient experienced significant exertional CP back in June of this year, after climbing a full flight of stairs. She took 1 NTG tablet, with complete resolution after about 30 minutes. Her second episode occurred this past Saturday, after walking up a slight incline and a small number of steps. She had a similar sensation of anterior chest "pressure", somewhat worse than the episode back in June, again resolving completely after 1 NTG tablet, within 30 minutes. In both cases, there was no radiation to the jaw or upper extremities, nor any associated diaphoresis or nausea. She did experience exertional dyspnea.  Patient has not had any recurrent CP, since her most recent episode this past Saturday. However, she has remained cautious, and has refrained from any strenuous activity. Both cases are reminiscent of her NST EMI presentation in 2008, but not quite as severe. At that time, she was successfully treated with Taxus DES of a 100% occluded LAD(x2). There was residual 70% CFX and 80% RCA disease; EF 45%.  No Known Allergies  Current Outpatient Prescriptions  Medication Sig Dispense Refill  . allopurinol (ZYLOPRIM) 100 MG tablet Take 100 mg by mouth daily.        . amLODipine (NORVASC) 5 MG tablet Take 5 mg by mouth daily.        . aspirin 325 MG tablet Take 325 mg by mouth daily.        . B Complex-C (SUPER B COMPLEX PO) Take 1 tablet by mouth daily.        . hydrochlorothiazide 25 MG tablet Take 1 tablet (25 mg total) by mouth daily.  90 tablet  3  . lisinopril (PRINIVIL,ZESTRIL) 40 MG tablet Take  40 mg by mouth daily.        . metFORMIN (GLUCOPHAGE) 500 MG tablet Take 1,000 mg by mouth 2 (two) times daily with a meal.        . metoprolol (TOPROL-XL) 200 MG 24 hr tablet Take 200 mg by mouth daily.        . nitroGLYCERIN (NITROSTAT) 0.4 MG SL tablet Place 0.4 mg under the tongue every 5 (five) minutes as needed.        . Omega-3 Fatty Acids (FISH OIL) 1000 MG CAPS Take 1 capsule by mouth daily.        . pravastatin (PRAVACHOL) 40 MG tablet Take 1 tablet by mouth Daily.      . vitamin E 400 UNIT capsule Take 400 Units by mouth daily.          Past Medical History  Diagnosis Date  . CAD (coronary artery disease)     Non-STEMI/Taxus stenting 100% left anterior descending (2), January 2008. Residual 70% circumflex;80% right coronary artery. Mild LVD (EF 45%);improved to 55-60% by 2-D echocardiogram,January 2009.   . Unspecified essential hypertension   . Dyslipidemia   . Obesity   . Lumbar herniated disc   . AV block, 1st degree   . DM (diabetes mellitus)   . Gout     History   Social History  . Marital Status: Legally Separated    Spouse Name: N/A      Number of Children: N/A  . Years of Education: N/A   Occupational History  . Not on file.   Social History Main Topics  . Smoking status: Never Smoker   . Smokeless tobacco: Never Used  . Alcohol Use: Not on file  . Drug Use: Not on file  . Sexually Active: Not on file   Other Topics Concern  . Not on file   Social History Narrative  . No narrative on file    Family History  Problem Relation Age of Onset  . Heart attack Sister   . Heart attack Brother     ROS: no nausea, vomiting; no fever, chills; no melena, hematochezia; complains of bilateral thigh claudication (L > R); denies reflux symptoms. Remaining systems reviewed, and are negative.  PHYSICAL EXAM:  BP 120/82  Pulse 94  Ht 5' 3" (1.6 m)  Wt 220 lb (99.791 kg)  BMI 38.97 kg/m2 GENERAL: 65-year-old female, morbidly obese, sitting upright;  NAD HEENT: NCAT, PERRLA, EOMI; sclera clear; no xanthelasma NECK: palpable bilateral carotid pulses, no bruits; no JVD; no TM LUNGS: CTA bilaterally CARDIAC: RRR (S1, S2); no significant murmurs; no rubs or gallops ABDOMEN: Protuberant; soft, non-tender; intact BS EXTREMETIES: intact bilateral femoral pulses, with soft left femoral bruit; no significant peripheral edema; minimally palpable PPs SKIN: warm/dry; no obvious rash/lesions MUSCULOSKELETAL: no joint deformity NEURO: no focal deficit; NL affect   EKG: reviewed and available in Electronic Records   ASSESSMENT & PLAN:   

## 2011-08-22 NOTE — OR Nursing (Signed)
Discharge instructions reviewed and signed, pt stated understanding, ambulated in hall without difficulty, site intact, level 0, no bleeding, transported to daughter's car via wheelchair. 

## 2011-08-27 ENCOUNTER — Other Ambulatory Visit: Payer: Self-pay | Admitting: Cardiothoracic Surgery

## 2011-08-27 ENCOUNTER — Other Ambulatory Visit: Payer: Self-pay

## 2011-08-27 ENCOUNTER — Institutional Professional Consult (permissible substitution) (INDEPENDENT_AMBULATORY_CARE_PROVIDER_SITE_OTHER): Payer: Medicare Other | Admitting: Cardiothoracic Surgery

## 2011-08-27 ENCOUNTER — Encounter (HOSPITAL_COMMUNITY): Payer: Self-pay | Admitting: Pharmacy Technician

## 2011-08-27 ENCOUNTER — Encounter: Payer: Self-pay | Admitting: Cardiothoracic Surgery

## 2011-08-27 VITALS — BP 156/90 | HR 96 | Resp 20 | Ht 63.0 in | Wt 215.0 lb

## 2011-08-27 DIAGNOSIS — I2510 Atherosclerotic heart disease of native coronary artery without angina pectoris: Secondary | ICD-10-CM

## 2011-08-27 DIAGNOSIS — I219 Acute myocardial infarction, unspecified: Secondary | ICD-10-CM

## 2011-08-27 NOTE — Patient Instructions (Addendum)
Change to 81 mg asa Stop lisinopril Monday am before surgery On day of surgery take only toprol  Coronary Artery Bypass Grafting Coronary artery bypass grafting (CABG) is done to bypass or fix arteries of the heart (coronary) that have become narrow or blocked. This is usually the result of plaque built up in the walls of the vessels. The coronary arteries supply the heart with the oxygen and nutrients it needs to pump blood to your body. The heart never rests and needs constant blood flow. If an artery is partially blocked, you may have chest pain (angina). Lack of blood flow to part of the heart muscle may cause that part to die. This is what happens in a heart attack (myocardial infarction). Reasons for CABG include:  Arteries that cannot be treated with medications or other interventions (such as a heart stent).   Severe angina not responsive to other treatment.   Improving heart function.   Treating a heart attack.  Every person is unique. Be sure you understand the risks and benefits of CABG.  RISKS AND COMPLICATIONS Your surgeon will discuss these with you. Your risks will be different depending on your past and present health and other factors. It may be helpful to have a family member or advocate with you so you feel free to ask questions and get the answers you need to give an informed consent. Possible problems of CABG surgery include:  Blood loss and replacement.   Stroke.   Infection.   Surgical site pain.   Heart attack during or after.   Kidney failure.  BEFORE THE PROCEDURE  Tell your doctor about any allergies, medicines, bleeding problems and other surgeries.   Take all medicines exactly as directed. You may start new medicines and stop taking others. Do not stop medications or adjust dosages on your own. Continue taking the medications up until the time of surgery.   Perform only activities that are suggested by your caregiver.   If you are overweight, you  should try to lose weight. Eat a heart-healthy diet that is low in fat and salt.   If you smoke, quit.   Let your caregiver know if you have been on steroids (including creams or drops) for long periods of time. This is critical.   If you are diabetic discuss with your doctor whether or not you should take insulin the day of your surgery.  DAY OF SURGERY:  Plan to arrive 60 minutes before the scheduled time or as directed.   Do not eat or drink anything, including medicine, unless directed.   You will sign a written consent. You may have blood work and other tests done.   Your family will be shown where they can wait for the surgeon to talk to them when the surgery is over.  PROCEDURE Only a specially trained surgeon does a CABG assisted by a team of other health care professionals. You will be asleep and not feel any discomfort during the surgery.   Traditional method:   A cut (incision) is made down the front of the chest through the breastbone (sternum).   The sternum is spread open so the surgeon can see your heart.   You are connected to a machine that does the work of your heart and lungs and your heart stops beating.   Veins are taken from your leg(s) and used to bypass the blocked arteries of your heart. Sometimes an artery from inside your chest wall is used, either by itself  or along with leg veins.   When the bypasses are done, you are taken off the machine, and your heart is restarted and takes over again.   Alternate methods:   The heart lung machine is not used. This is called off-pump. Your heart continues to beat while the bypasses are done.   Smaller incisions are used instead of going through the middle of the chest (minimally invasive).   Intended to reduce pain and promote a faster recovery.  AFTER THE PROCEDURE  You may wake up with a tube in your throat to help your breathing. You may be connected to a breathing machine.   You will not be able to talk  while the tube is in place. Try not to fight against it. The tube will be taken out as soon as it is safe.   Even if you cannot see them, there are nurses nearby who are watching everything. You are not alone.   Your family should be prepared to see you with many tubes and wires. You will be sleepy and pale. Your family can hold your hand and speak to you, but you may have no memory of this time.  SEEK IMMEDIATE MEDICAL CARE IF:  You have severe chest pain, especially if the pain is crushing or pressure-like and spreads to the arms, back, neck, or jaw, or if you have sweating, feel sick to your stomach (nausea), or shortness of breath. THIS IS AN EMERGENCY. Do not wait to see if the pain will go away. Get medical help at once. Call your local emergency services (911 in [place] DO NOT drive yourself to the hospital.   You have an attack of chest pain lasting longer than usual, despite rest and treatment with the medications your doctor has prescribed.   You wake from sleep with chest pain.   You feel dizzy or faint.  Document Released: 05/23/2005 Document Revised: 04/25/2011 Document Reviewed: 01/28/2008 [facility] Patient Information 2012 [place], [place].  Coronary Artery Bypass Grafting Care After Refer to this sheet in the next few weeks. These instructions provide you with information on caring for yourself after your procedure. Your caregiver may also give you more specific instructions. Your treatment has been planned according to current medical practices, but problems sometimes occur. Call your caregiver if you have any problems or questions after your procedure.  Recovery from open heart surgery will be different for everyone. Some people feel well after 3 or 4 weeks, while for others it takes longer. After heart surgery, it may be normal to:  Not have an appetite, feel nauseated by the smell of food, or only want to eat a small amount.   Be constipated because of changes in your diet,  activity, and medicines. Eat foods high in fiber. Add fresh fruits and vegetables to your diet. Stool softeners may be helpful.   Feel sad or unhappy. You may be frustrated or cranky. You may have good days and bad days. Do not give up. Talk to your caregiver if you do not feel better.   Feel weakness and fatigue. You many need physical therapy or cardiac rehabilitation to get your strength back.   Develop an irregular heartbeat called atrial fibrillation. Symptoms of atrial fibrillation are a fast, irregular heartbeat or feelings of fluttery heartbeats, shortness of breath, low blood pressure, and dizziness. If these symptoms develop, see your caregiver right away.  MEDICATION  Have a list of all the medicines you will be taking when you leave the  hospital. For every medicine, know the following:   Name.   Exact dose.   Time of day to be taken.   How often it should be taken.   Why you are taking it.   Ask which medicines should or should not be taken together. If you take more than one heart medicine, ask if it is okay to take them together. Some heart medicines should not be taken at the same time because they may lower your blood pressure too much.   Narcotic pain medicine can cause constipation. Eat fresh fruits and vegetables. Add fiber to your diet. Stool softener medicine may help relieve constipation.   Keep a copy of your medicines with you at all times.   Do not add or stop taking any medicine until you check with your caregiver.   Medicines can have side effects. Call your caregiver who prescribed the medicine if you:   Start throwing up, have diarrhea, or have stomach pain.   Feel dizzy or lightheaded when you stand up.   Feel your heart is skipping beats or is beating too fast or too slow.   Develop a rash.   Notice unusual bruising or bleeding.  HOME CARE INSTRUCTIONS  After heart surgery, it is important to learn how to take your pulse. Have your caregiver  show you how to take your pulse.   Use your incentive spirometer. Ask your caregiver how long after surgery you need to use it.  Care of your chest incision  Tell your caregiver right away if you notice clicking in your chest (sternum).   Support your chest with a pillow or your arms when you take deep breaths and cough.   Follow your caregiver's instructions about when you can bathe or swim.   Protect your incision from sunlight during the first year to keep the scar from getting dark.   Tell your caregiver if you notice:   Increased tenderness of your incision.   Increased redness or swelling around your incision.   Drainage or pus from your incision.  Care of your leg incision(s)  Avoid crossing your legs.   Avoid sitting for long periods of time. Change positions every half hour.   Elevate your leg(s) when you are sitting.   Check your leg(s) daily for swelling. Check the incisions for redness or drainage.   Wear your elastic stockings as told by your caregiver. Take them off at bedtime.  Diet  Diet is very important to heart health.   Eat plenty of fresh fruits and vegetables. Meats should be lean cut. Avoid canned, processed, and fried foods.   Talk to a dietician. They can teach you how to make healthy food and drink choices.  Weight  Weigh yourself every day. This is important because it helps to know if you are retaining fluid that may make your heart and lungs work harder.   Use the same scale each time.   Weigh yourself every morning at the same time. You should do this after you go to the bathroom, but before you eat breakfast.   Your weight will be more accurate if you do not wear any clothes.   Record your weight.   Tell your caregiver if you have gained 2 pounds or more overnight.  Activity Stop any activity at once if you have chest pain, shortness of breath, irregular heartbeats, or dizziness. Get help right away if you have any of these  symptoms.  Bathing.  Avoid soaking in a  bath or hot tub until your incisions are healed.   Rest. You need a balance of rest and activity.   Exercise. Exercise per your caregiver's advice. You may need physical therapy or cardiac rehabilitation to help strengthen your muscles and build your endurance.   Climbing stairs. Unless your caregiver tells you not to climb stairs, go up stairs slowly and rest if you tire. Do not pull yourself up by the handrail.   Driving a car. Follow your caregiver's advice on when you may drive. You may ride as a passenger at any time. When traveling for long periods of time in a car, get out of the car and walk around for a few minutes every 2 hours.   Lifting. Avoid lifting, pushing, or pulling anything heavier than 10 pounds for 6 weeks after surgery or as told by your caregiver.   Returning to work. Check with your caregiver. People heal at different rates. Most people will be able to go back to work 6 to 12 weeks after surgery.   Sexual activity. You may resume sexual relations as told by your caregiver.  SEEK MEDICAL CARE IF:  Any of your incisions are red, painful, or have any type of drainage coming from them.   You have an oral temperature above 102 F (38.9 C).   You have ankle or leg swelling.   You have pain in your legs.   You have weight gain of 2 or more pounds a day.   You feel dizzy or lightheaded when you stand up.  SEEK IMMEDIATE MEDICAL CARE IF:  You have angina or chest pain that goes to your jaw or arms. Call your local emergency services right away.   You have shortness of breath at rest or with activity.   You have a fast or irregular heartbeat (arrhythmia).   There is a "clicking" in your sternum when you move.   You have numbness or weakness in your arms or legs.  MAKE SURE YOU:  Understand these instructions.   Will watch your condition.   Will get help right away if you are not doing well or get worse.  Document  Released: 03/02/2005 Document Revised: 04/25/2011 Document Reviewed: 10/18/2010 [facility] Patient Information 2012 [place], [place].

## 2011-08-28 ENCOUNTER — Encounter: Payer: Self-pay | Admitting: Cardiothoracic Surgery

## 2011-08-28 NOTE — Progress Notes (Signed)
[address]            [place] [address]          [phone]      [name] [facility] Medical Record #[id number] Date of Birth: 12/17/1945  Referring: [name], MD Primary Care: Juliette Alcide, MD, MD  Chief Complaint:    Chief Complaint  Patient presents with  . Coronary Artery Disease    Referral from Dr Kirke Corin for eval on CABG, Cath'ed 08/22/11    History of Present Illness:    Patient diabetic with history of acute anterior MI treated with stent presents with new onset of exertional chest pain for the past 6 months. Mild exertional SOB,       Current Activity/ Functional Status: Patient is independent with mobility/ambulation, transfers, ADL's, IADL's.    Past Medical History  Diagnosis Date  . CAD (coronary artery disease)     Non-STEMI/Taxus stenting 100% left anterior descending (2), January 2008. Residual 70% circumflex;80% right coronary artery. Mild LVD (EF 45%);improved to 55-60% by 2-D echocardiogram,January 2009.   [name] Unspecified essential hypertension   . Dyslipidemia   . Obesity   . Lumbar herniated disc   . AV block, 1st degree   . DM (diabetes mellitus)  for 5 years  . Gout   History of evaluation by oncology for "lymphadonapaty, told she did not have lymphoma  Past Surgical History  Procedure Date  . Cardiac catheterization 2008    NON-STEMI/TAXUS STENTING 100% PROXIMAL LAD (X2) JANUARY 2008    Family History  Problem Relation Age of Onset  . Heart attack Sister 18  . Heart attack Brother 65    Work: worked in [employer] disabled  Carpel tunnel bilaterial  History  Smoking status  . Never Smoker   Smokeless tobacco  . Never Used    History  Alcohol Use: NO     No Known Allergies  Current Outpatient Prescriptions  Medication Sig Dispense Refill  . allopurinol (ZYLOPRIM) 100 MG tablet Take 100 mg by mouth daily.        [name] amLODipine (NORVASC) 5 MG tablet Take 5 mg  by mouth daily.        [name] aspirin EC 81 MG tablet Take 81 mg by mouth daily.        . B Complex-C (SUPER B COMPLEX PO) Take 1 tablet by mouth daily.        . hydrochlorothiazide 25 MG tablet Take 1 tablet (25 mg total) by mouth daily.  90 tablet  3  . lisinopril (PRINIVIL,ZESTRIL) 40 MG tablet Take 40 mg by mouth daily.        . metFORMIN (GLUCOPHAGE) 500 MG tablet Take 1,000 mg by mouth 2 (two) times daily with a meal.        . metoprolol (TOPROL-XL) 200 MG 24 hr tablet Take 200 mg by mouth daily.        . Omega-3 Fatty Acids (FISH OIL) 1000 MG CAPS Take 1 capsule by mouth daily.        . pravastatin (PRAVACHOL) 40 MG tablet Take 1 tablet by mouth Daily.      . vitamin E 400 UNIT capsule Take 400 Units by mouth daily.        . nitroGLYCERIN (NITROSTAT) 0.4 MG SL tablet Place 0.4 mg under the tongue every 5 (five) minutes as  needed. Chest pain            Review of Systems:     Cardiac Review of Systems: Y or N  Chest Pain [  y  ]  Resting SOB [n   ] Exertional SOB  [ y ]  [name] [name]  ]   Pedal Edema [  n ]    Palpitations [n  ] Syncope  [ n ]   Presyncope [ n  ]  General Review of Systems: [Y] = yes [  ]=no Constitional: recent weight change [n  ]; anorexia [  ]; fatigue [  ]; nausea [  ]; night sweats [  ]; fever [  ]; or chills [  ];                                                                                                                                          Dental: poor dentition[  ];   Eye : blurred vision [  ]; diplopia [   ]; vision changes [  ];  Amaurosis fugax[  ]; Resp: cough [  ];  wheezing[n  ];  hemoptysis[n  ]; shortness of breath[  ]; paroxysmal nocturnal dyspnea[  ]; dyspnea on exertion[  ]; or orthopnea[  ];  GI:  gallstones[  ], vomiting[  ];  dysphagia[  ]; melena[  ];  hematochezia [  ]; heartburn[  ];   Hx of  Colonoscopy[  ]; GU: kidney stones [  ]; hematuria[  ];   dysuria [  ];  nocturia[  ];  history of     obstruction [  ];             Skin: rash,  swelling[  ];, hair loss[  ];  peripheral edema[  ];  or itching[  ]; Musculosketetal: myalgias[  ];  joint swelling[  ];  joint erythema[  ];  joint pain[  ];  back pain[  ];  Heme/Lymph: bruising[  ];  bleeding[  ];  anemia[  ];  Neuro: TIA[  ];  headaches[  ];  stroke[  ];  vertigo[  ];  seizures[  ];   paresthesias[  ];  difficulty walking[  ];  Psych:depression[  ]; anxiety[  ];  Endocrine: diabetes[  ];  thyroid dysfunction[  ];  Immunizations: Flu [?  ]; Pneumococcal[ ? ];  Other:  Physical Exam: BP 156/90  Pulse 96  Resp 20  Ht 5\' 3"  (1.6 m)  Wt 215 lb (97.523 kg)  BMI 38.09 kg/m2  SpO2 98%  General appearance: alert, cooperative, appears older than stated age and no distress Neurologic: intact Heart: regular rate and rhythm, S1, S2 normal, no murmur, click, rub or gallop Lungs: clear to auscultation bilaterally and normal percussion bilaterally Abdomen: soft, non-tender; bowel sounds normal; no masses,  no organomegaly Extremities: extremities normal, atraumatic, no cyanosis or edema,  Homans sign is negative, no sign of DVT, no edema, redness or tenderness in the calves or thighs and no ulcers, gangrene or trophic changes Palpable pedal pulses bilaterial pulses  Obese abdomen unable to palpated abdominal;l aorta  Diagnostic Studies & Laboratory data:     Recent Radiology Findings:   No results found.    Recent Lab Findings: Lab Results  Component Value Date   WBC 6.6 09/19/2010   HGB 13.0 09/19/2010   HCT 39.3 09/19/2010   PLT 276 09/19/2010   GLUCOSE 133* 08/22/2011   ALT 28 03/20/2010   AST 23 09/19/2010   NA 145 09/19/2010   K 4.1 09/19/2010   CL 98 09/19/2010   CREATININE 1.3* 09/19/2010   BUN 19 09/19/2010   CO2 30 09/19/2010   INR 0.88 07/15/2009   Clinical Data: Follow-up non-Hodgkins lymphoma. Right flank pain.  CT CHEST, ABDOMEN AND PELVIS WITH CONTRAST  Technique: Multidetector CT imaging of the chest, abdomen and  pelvis was performed following the  standard protocol during bolus  administration of intravenous contrast.  Contrast: 100 ml Omnipaque-300 and oral contrast  Comparison: 03/03/2010  CT CHEST  Findings: No hilar or mediastinal masses are identified. No  evidence of lymphadenopathy elsewhere within the thorax. No  evidence of pleural or pericardial effusion. Both lungs are clear.  No evidence of pulmonary infiltrate or mass. No suspicious bone  lesions identified.  IMPRESSION:  Negative. No evidence of recurrent lymphoma or other acute  findings.  CT ABDOMEN AND PELVIS  Findings: Hepatic steatosis again demonstrated as well as prior  cholecystectomy. No liver masses are identified. Spleen is normal  in size in appearance. The pancreas, adrenal glands, and kidneys  also show no significant abnormalities.  Mild lymphadenopathy in the root of the small bowel mesentery and  abdominal retroperitoneum remains stable. The largest area of  adenopathy in the retrocaval space measures 1.6 x 2.9 cm and is  stable. No definite lymphadenopathy is seen within the pelvis.  Soft tissue density in the left adnexa with central cyst is  decreased since previous study and is consistent with the ovary.  Previous hysterectomy is noted. No new or increased areas of  lymphadenopathy are identified.  There is no evidence of inflammatory process or abnormal fluid  collections. Left colonic diverticulosis is noted, however there  is no evidence of diverticulitis. No evidence of bowel  obstruction. No suspicious bone lesions are identified.  IMPRESSION:  1. Stable mild abdominal retroperitoneal and mesenteric  lymphadenopathy.  2. No new or progressive disease within the abdomen or pelvis.  3. Hepatic steatosis.  Provider: Hinda Kehr, Darla Lesches, Cristin Willeen Cass  Cardiac Catheterization Procedure Note  Name: [name]  MRN: [id number]  DOB: September 06, 1945  Procedure: Left Heart Cath, Selective Coronary Angiography, LV angiography    Indication: This is a 66 year old female with known history of coronary artery disease status post angioplasty and 2 drug-eluting stent placements to the left anterior descending artery in 2008. She presented with progressive symptoms of exertional chest pain which was worrisome for angina. Cardiac catheterization was thus recommended.  Medications:  Sedation: 1 mg IV Versed, 25 mcg IV Fentanyl  Contrast: 160 Omnipaque  Procedural details: The right groin was prepped, draped, and anesthetized with 1% lidocaine. Using modified Seldinger technique, a 4 French sheath was introduced into the right femoral artery. Standard Judkins catheters were used for coronary angiography and left ventriculography. However, I could not engage the left anterior descending artery which has a separate ostium  from the left circumflex in spite of trying multiple catheters including JL4, JL 3.5, AL-1, AL2 and an AR-1. This artery was previously engaged with JL 3.0 catheter which we did not have in a 4 [place] system. Thus, I up sized sheath to a 5 [place] and used a JL 3.0 catheter. Nonselective angiography was performed and it appears that the LAD is occluded. Catheter exchanges were performed over a guidewire. There were no immediate procedural complications. The patient was transferred to the post catheterization recovery area for further monitoring.  Procedural Findings:  Hemodynamics:  AO: 126/66 mmHg  LV: 134/18 mmHg  LVEDP: 23 MmHg  Mild gradient across the aortic valve.  Coronary angiography:  Coronary dominance: Codominant.  Left Main: The LAD and circumflex originated from separate ostia.  Left Anterior Descending (LAD): The vessel is occluded at the ostium. 2 overlapped stents are noted in the proximal segment. The vessel fills via collaterals from the RCA as well as some collaterals from the left circumflex to the diagonals.  Circumflex (LCx): The vessel is normal in size. 30% stenosis is noted right after giving  OM1. In the midsegment there is also 2 separate lesions each causing about 60% stenosis.  1st obtuse marginal: Overall large with a tubular 70% stenosis proximally.  2nd obtuse marginal: Small in size and free of significant disease.  3rd obtuse marginal: Small in size and free of significant disease.  Right Coronary Artery: The vessel is normal in size. There is an 80% stenosis in the midsegment right after giving RV 1. There is diffuse 20% disease in the midsegment.  posterior descending artery: Normal in size and free of significant disease. It gives collaterals to the left anterior descending artery. Left ventriculography: Left ventricular systolic function is mildly reduced , LVEF is estimated at 40-45 %, there is no significant mitral regurgitation . There is moderate anterior wall hypokinesis as well as dyskinesias of the apex.  Final Conclusions:  1. Significant three-vessel coronary artery disease with an occluded left anterior descending artery where 2 previous drug-eluting stents were placed.  2. Mildly reduced LV systolic function with an estimated ejection fraction of 40-45%.  3. Mild to moderate elevation in left ventricular end-diastolic pressure.  4. mild gradient across the aortic valve likely due to mild degree of stenosis.  Recommendations: I recommend CABG. Given her three-vessel coronary artery disease including an occluded left anterior descending artery with 2 previous drug-eluting stents were placed, mildly reduced LV systolic function and known history of diabetes, the patient will get the best long-term outcome from surgical revascularization versus PCI. An echocardiogram will be requested before the surgery.  Lorine Bears MD, [facility]  08/22/2011, 11:59 AM   Assessment / Plan:   New onset of exertional angina with 3 vessel CAD in diabetic female with thrombosed LAD stent  Question of Mild aortic Vale gradient, Murmur not appreciated. Has not had echocardiogram will obtain  prior to CABG Have recommended CABG to patient, with 3 vessel CAD, in diabetic and with mod reduction of LV function  The goals risks and alternatives of the planned surgical procedure CABG have been discussed with the patient in detail. The risks of the procedure including death, infection, stroke, myocardial infarction, bleeding, blood transfusion have all been discussed specifically.  I have quoted [name] a 3% of perioperative mortality and a complication rate as high as 15 %. The patient's questions have been answered.[name] is willing  to proceed with the planned procedure. Plan for Jan  9       Delight Ovens MD  Beeper [phone] Office [phone] 08/28/2011 5:50 PM

## 2011-09-03 ENCOUNTER — Ambulatory Visit (HOSPITAL_COMMUNITY)
Admission: RE | Admit: 2011-09-03 | Discharge: 2011-09-03 | Disposition: A | Discharge status: Home / Self Care | Payer: Medicare Other | Source: Ambulatory Visit | Attending: Cardiothoracic Surgery | Admitting: Cardiothoracic Surgery

## 2011-09-03 ENCOUNTER — Other Ambulatory Visit: Payer: Self-pay

## 2011-09-03 ENCOUNTER — Encounter (HOSPITAL_COMMUNITY)
Admission: RE | Admit: 2011-09-03 | Discharge: 2011-09-03 | Disposition: A | Discharge status: Home / Self Care | Payer: Medicare Other | Source: Ambulatory Visit | Attending: Cardiothoracic Surgery | Admitting: Cardiothoracic Surgery

## 2011-09-03 ENCOUNTER — Inpatient Hospital Stay (HOSPITAL_COMMUNITY)
Admission: RE | Admit: 2011-09-03 | Discharge: 2011-09-03 | Disposition: A | Discharge status: Home / Self Care | Payer: Medicare Other | Source: Ambulatory Visit | Attending: Cardiothoracic Surgery | Admitting: Cardiothoracic Surgery

## 2011-09-03 ENCOUNTER — Encounter (HOSPITAL_COMMUNITY): Payer: Self-pay

## 2011-09-03 DIAGNOSIS — Z01811 Encounter for preprocedural respiratory examination: Secondary | ICD-10-CM | POA: Diagnosis not present

## 2011-09-03 DIAGNOSIS — Z01812 Encounter for preprocedural laboratory examination: Secondary | ICD-10-CM | POA: Diagnosis not present

## 2011-09-03 DIAGNOSIS — I379 Nonrheumatic pulmonary valve disorder, unspecified: Secondary | ICD-10-CM | POA: Insufficient documentation

## 2011-09-03 DIAGNOSIS — Z01810 Encounter for preprocedural cardiovascular examination: Secondary | ICD-10-CM

## 2011-09-03 DIAGNOSIS — I2510 Atherosclerotic heart disease of native coronary artery without angina pectoris: Secondary | ICD-10-CM | POA: Insufficient documentation

## 2011-09-03 DIAGNOSIS — I10 Essential (primary) hypertension: Secondary | ICD-10-CM | POA: Insufficient documentation

## 2011-09-03 DIAGNOSIS — Z01818 Encounter for other preprocedural examination: Secondary | ICD-10-CM | POA: Diagnosis not present

## 2011-09-03 DIAGNOSIS — E785 Hyperlipidemia, unspecified: Secondary | ICD-10-CM | POA: Insufficient documentation

## 2011-09-03 DIAGNOSIS — E119 Type 2 diabetes mellitus without complications: Secondary | ICD-10-CM | POA: Insufficient documentation

## 2011-09-03 HISTORY — DX: Dorsalgia, unspecified: M54.9

## 2011-09-03 HISTORY — DX: Nonrheumatic aortic (valve) stenosis: I35.0

## 2011-09-03 HISTORY — DX: Frequency of micturition: R35.0

## 2011-09-03 HISTORY — DX: Acute myocardial infarction, unspecified: I21.9

## 2011-09-03 HISTORY — DX: Nocturia: R35.1

## 2011-09-03 HISTORY — DX: Unspecified osteoarthritis, unspecified site: M19.90

## 2011-09-03 HISTORY — DX: Reserved for concepts with insufficient information to code with codable children: IMO0002

## 2011-09-03 HISTORY — DX: Unspecified cataract: H26.9

## 2011-09-03 LAB — COMPREHENSIVE METABOLIC PANEL
ALT: 23 U/L (ref 0–35)
AST: 19 U/L (ref 0–37)
Albumin: 4 g/dL (ref 3.5–5.2)
Alkaline Phosphatase: 50 U/L (ref 39–117)
BUN: 16 mg/dL (ref 6–23)
CO2: 25 mEq/L (ref 19–32)
Calcium: 9.8 mg/dL (ref 8.4–10.5)
Chloride: 101 mEq/L (ref 96–112)
Creatinine, Ser: 1.07 mg/dL (ref 0.50–1.10)
GFR calc Af Amer: 61 mL/min — ABNORMAL LOW (ref >90)
GFR calc non Af Amer: 53 mL/min — ABNORMAL LOW (ref >90)
Glucose, Bld: 126 mg/dL — ABNORMAL HIGH (ref 70–99)
Potassium: 3.8 mEq/L (ref 3.5–5.1)
Sodium: 140 mEq/L (ref 135–145)
Total Bilirubin: 0.5 mg/dL (ref 0.3–1.2)
Total Protein: 7.3 g/dL (ref 6.0–8.3)

## 2011-09-03 LAB — CBC
HCT: 37.1 % (ref 36.0–46.0)
Hemoglobin: 12.8 g/dL (ref 12.0–15.0)
MCH: 30.1 pg (ref 26.0–34.0)
MCHC: 34.5 g/dL (ref 30.0–36.0)
MCV: 87.3 fL (ref 78.0–100.0)
Platelets: 284 10*3/uL (ref 150–400)
RBC: 4.25 MIL/uL (ref 3.87–5.11)
RDW: 13.9 % (ref 11.5–15.5)
WBC: 7.9 10*3/uL (ref 4.0–10.5)

## 2011-09-03 LAB — URINALYSIS, ROUTINE W REFLEX MICROSCOPIC
Bilirubin Urine: NEGATIVE
Glucose, UA: NEGATIVE mg/dL
Hgb urine dipstick: NEGATIVE
Ketones, ur: NEGATIVE mg/dL
Leukocytes, UA: NEGATIVE
Nitrite: NEGATIVE
Protein, ur: NEGATIVE mg/dL
Specific Gravity, Urine: 1.01 (ref 1.005–1.030)
Urobilinogen, UA: 0.2 mg/dL (ref 0.0–1.0)
pH: 5.5 (ref 5.0–8.0)

## 2011-09-03 LAB — PROTIME-INR
INR: 0.96 (ref 0.00–1.49)
Prothrombin Time: 13 seconds (ref 11.6–15.2)

## 2011-09-03 LAB — TYPE AND SCREEN
ABO/RH(D): A POS
Antibody Screen: NEGATIVE
Unit division: 0
Unit division: 0

## 2011-09-03 LAB — BLOOD GAS, ARTERIAL
Acid-Base Excess: 3.2 mmol/L — ABNORMAL HIGH (ref 0.0–2.0)
Bicarbonate: 26.8 mEq/L — ABNORMAL HIGH (ref 20.0–24.0)
Drawn by: 181601
FIO2: 0.21 %
O2 Saturation: 98.9 %
Patient temperature: 98.6
TCO2: 28 mmol/L (ref 0–100)
pCO2 arterial: 38.2 mmHg (ref 35.0–45.0)
pH, Arterial: 7.461 — ABNORMAL HIGH (ref 7.350–7.400)
pO2, Arterial: 122 mmHg — ABNORMAL HIGH (ref 80.0–100.0)

## 2011-09-03 LAB — HEMOGLOBIN A1C
Hgb A1c MFr Bld: 7.1 % — ABNORMAL HIGH (ref <5.7)
Mean Plasma Glucose: 157 mg/dL — ABNORMAL HIGH (ref <117)

## 2011-09-03 LAB — APTT: aPTT: 36 seconds (ref 24–37)

## 2011-09-03 LAB — ABO/RH: ABO/RH(D): A POS

## 2011-09-03 LAB — PULMONARY FUNCTION TEST

## 2011-09-03 LAB — SURGICAL PCR SCREEN
MRSA, PCR: NEGATIVE
Staphylococcus aureus: NEGATIVE

## 2011-09-03 MED ORDER — CHLORHEXIDINE GLUCONATE 4 % EX LIQD: 30.0000 mL | CUTANEOUS | Status: DC

## 2011-09-03 NOTE — Progress Notes (Signed)
Pre CABG completed at 09:30.  Preliminary report is 60-79% ICA stenosis on the right and no evidence of significant stenosis on the left.  ABI is within normal limits with abnormal Doppler waveforms bilaterally. Smiley Houseman 09/03/2011, 11:44 AM

## 2011-09-03 NOTE — Progress Notes (Signed)
Heart cath done 08/22/11-report in epic  EF 40-45%  Cardiologist is Dr.DeGent with [facility]  Dopplers done @ 0900 on 09/03/11 Echo done @ 1000 on 09/03/11 PFT's scheduled for 1200 @ 09/03/11

## 2011-09-03 NOTE — Progress Notes (Signed)
  Echocardiogram 2D Echocardiogram has been performed.  [name] Nira Retort 09/03/2011, 11:50 AM

## 2011-09-03 NOTE — Progress Notes (Signed)
Pt states stress test > 75yrs ago at [facility]

## 2011-09-03 NOTE — Pre-Procedure Instructions (Signed)
20 [name]  09/03/2011   Your procedure is scheduled on:  Wed,Jan 9 @ 0830  Report to [facility] at 0630 AM.  Call this number if you have problems the morning of surgery: [phone]   Remember:   Do not eat food:After Midnight.  May have clear liquids: up to 4 Hours before arrival.(until 6:30 am)  Clear liquids include soda, tea, black coffee, apple or grape juice, broth.  Take these medicines the morning of surgery with A SIP OF WATER: Allopurinol,Amlodipine,Metoprolol   Do not wear jewelry, make-up or nail polish.  Do not wear lotions, powders, or perfumes. You may wear deodorant.  Do not shave 48 hours prior to surgery.  Do not bring valuables to the hospital.  Contacts, dentures or bridgework may not be worn into surgery.  Leave suitcase in the car. After surgery it may be brought to your room.  For patients admitted to the hospital, checkout time is 11:00 AM the day of discharge.   Patients discharged the day of surgery will not be allowed to drive home.  Name and phone number of your driver:   Special Instructions: CHG Shower Use Special Wash: 1/2 bottle night before surgery and 1/2 bottle morning of surgery.   Please read over the following fact sheets that you were given: Pain Booklet, Coughing and Deep Breathing, Blood Transfusion Information, Open Heart Packet, MRSA Information and Surgical Site Infection Prevention

## 2011-09-04 MED ORDER — VANCOMYCIN HCL 1000 MG IV SOLR
1500.0000 mg | INTRAVENOUS | Status: AC
  Administered 2011-09-05: 1500 mg via INTRAVENOUS
  Filled 2011-09-04 (×2): qty 1500

## 2011-09-04 MED ORDER — METOPROLOL TARTRATE 12.5 MG HALF TABLET: 12.5000 mg | ORAL_TABLET | Freq: Once | ORAL | Status: DC

## 2011-09-04 MED ORDER — SODIUM CHLORIDE 0.9 % IV SOLN
INTRAVENOUS | Status: DC
  Filled 2011-09-04: qty 40

## 2011-09-04 MED ORDER — POTASSIUM CHLORIDE 2 MEQ/ML IV SOLN
80.0000 meq | INTRAVENOUS | Status: DC
  Filled 2011-09-04: qty 40

## 2011-09-04 MED ORDER — PHENYLEPHRINE HCL 10 MG/ML IJ SOLN
30.0000 ug/min | INTRAVENOUS | Status: AC
  Administered 2011-09-05: 10 ug/min via INTRAVENOUS
  Filled 2011-09-04: qty 2

## 2011-09-04 MED ORDER — NITROGLYCERIN IN D5W 200-5 MCG/ML-% IV SOLN
2.0000 ug/min | INTRAVENOUS | Status: AC
  Administered 2011-09-05: 5 ug/min via INTRAVENOUS
  Filled 2011-09-04: qty 250

## 2011-09-04 MED ORDER — PLASMA-LYTE 148 IV SOLN
INTRAVENOUS | Status: AC
  Administered 2011-09-05: 10:00:00
  Filled 2011-09-04: qty 0.5

## 2011-09-04 MED ORDER — DOPAMINE-DEXTROSE 3.2-5 MG/ML-% IV SOLN
2.0000 ug/kg/min | INTRAVENOUS | Status: DC
  Filled 2011-09-04: qty 250

## 2011-09-04 MED ORDER — SODIUM CHLORIDE 0.9 % IV SOLN
INTRAVENOUS | Status: AC
  Administered 2011-09-05: 3.6 [IU]/h via INTRAVENOUS
  Filled 2011-09-04 (×2): qty 1

## 2011-09-04 MED ORDER — DEXTROSE 5 % IV SOLN
1.5000 g | INTRAVENOUS | Status: AC
  Administered 2011-09-05: .75 g via INTRAVENOUS
  Administered 2011-09-05: 1.5 g via INTRAVENOUS
  Filled 2011-09-04 (×2): qty 1.5

## 2011-09-04 MED ORDER — EPINEPHRINE HCL 1 MG/ML IJ SOLN
0.5000 ug/min | INTRAVENOUS | Status: DC
  Filled 2011-09-04: qty 4

## 2011-09-04 MED ORDER — DEXTROSE 5 % IV SOLN
750.0000 mg | INTRAVENOUS | Status: DC
  Filled 2011-09-04: qty 750

## 2011-09-04 MED ORDER — MAGNESIUM SULFATE 50 % IJ SOLN
40.0000 meq | INTRAMUSCULAR | Status: DC
  Filled 2011-09-04: qty 10

## 2011-09-04 MED ORDER — SODIUM CHLORIDE 0.9 % IV SOLN
0.1000 ug/kg/h | INTRAVENOUS | Status: AC
  Administered 2011-09-05: .2 ug/kg/h via INTRAVENOUS
  Filled 2011-09-04: qty 4

## 2011-09-04 NOTE — Consult Note (Signed)
Anesthesia:  Patient is a 66 year old female scheduled for CABG on 09/05/11.  Her history includes CAD s/p 2 prior LAD stents, dyslipidemia, obesity, herniated lumbar disc, OSA, DM2, HTN, and early cataracts.  She has seen both Dr. Kirke Corin and Dr. Andee Lineman with [facility].  Cardiac cath was done on 08/22/11 (see Notes tab) showing 3V CAD, EF 40-45%, mild gradient across the AV likely due to mild AS.  Echo was done on 09/03/11 (see Results Review tab) and showed: Left ventricle: The cavity size was normal. Systolic function was normal. The estimated ejection fraction was in the range of 55% to 60%. There was an increased relative contribution of atrial contraction to ventricular filling.  No AS.   EKG from 09/03/11 showed SR with first degree AVB, anteroseptal infarct.  Labs and CXR noted.  Plan to proceed.

## 2011-09-05 ENCOUNTER — Inpatient Hospital Stay (HOSPITAL_COMMUNITY): Payer: Medicare Other

## 2011-09-05 ENCOUNTER — Ambulatory Visit (HOSPITAL_COMMUNITY): Payer: Medicare Other | Admitting: Vascular Surgery

## 2011-09-05 ENCOUNTER — Encounter (HOSPITAL_COMMUNITY): Payer: Self-pay | Admitting: Vascular Surgery

## 2011-09-05 ENCOUNTER — Inpatient Hospital Stay (HOSPITAL_COMMUNITY)
Admission: RE | Admit: 2011-09-05 | Discharge: 2011-09-11 | DRG: 236 | Disposition: A | Discharge status: Home / Self Care | Payer: Medicare Other | Source: Ambulatory Visit | Attending: Cardiothoracic Surgery | Admitting: Cardiothoracic Surgery

## 2011-09-05 ENCOUNTER — Encounter (HOSPITAL_COMMUNITY): Payer: Self-pay | Admitting: *Deleted

## 2011-09-05 ENCOUNTER — Encounter (HOSPITAL_COMMUNITY)
Admission: RE | Disposition: A | Discharge status: Home / Self Care | Payer: Self-pay | Source: Ambulatory Visit | Attending: Cardiothoracic Surgery

## 2011-09-05 DIAGNOSIS — D62 Acute posthemorrhagic anemia: Secondary | ICD-10-CM | POA: Diagnosis not present

## 2011-09-05 DIAGNOSIS — I440 Atrioventricular block, first degree: Secondary | ICD-10-CM | POA: Diagnosis present

## 2011-09-05 DIAGNOSIS — I359 Nonrheumatic aortic valve disorder, unspecified: Secondary | ICD-10-CM | POA: Diagnosis not present

## 2011-09-05 DIAGNOSIS — E119 Type 2 diabetes mellitus without complications: Secondary | ICD-10-CM | POA: Diagnosis present

## 2011-09-05 DIAGNOSIS — Z951 Presence of aortocoronary bypass graft: Secondary | ICD-10-CM

## 2011-09-05 DIAGNOSIS — E8779 Other fluid overload: Secondary | ICD-10-CM | POA: Diagnosis not present

## 2011-09-05 DIAGNOSIS — I209 Angina pectoris, unspecified: Secondary | ICD-10-CM | POA: Diagnosis present

## 2011-09-05 DIAGNOSIS — I2582 Chronic total occlusion of coronary artery: Secondary | ICD-10-CM | POA: Diagnosis present

## 2011-09-05 DIAGNOSIS — E785 Hyperlipidemia, unspecified: Secondary | ICD-10-CM | POA: Diagnosis present

## 2011-09-05 DIAGNOSIS — J90 Pleural effusion, not elsewhere classified: Secondary | ICD-10-CM | POA: Diagnosis not present

## 2011-09-05 DIAGNOSIS — G4710 Hypersomnia, unspecified: Secondary | ICD-10-CM | POA: Diagnosis not present

## 2011-09-05 DIAGNOSIS — I252 Old myocardial infarction: Secondary | ICD-10-CM | POA: Diagnosis not present

## 2011-09-05 DIAGNOSIS — Z8249 Family history of ischemic heart disease and other diseases of the circulatory system: Secondary | ICD-10-CM | POA: Diagnosis not present

## 2011-09-05 DIAGNOSIS — M109 Gout, unspecified: Secondary | ICD-10-CM | POA: Diagnosis present

## 2011-09-05 DIAGNOSIS — Z7982 Long term (current) use of aspirin: Secondary | ICD-10-CM | POA: Diagnosis not present

## 2011-09-05 DIAGNOSIS — Z9861 Coronary angioplasty status: Secondary | ICD-10-CM

## 2011-09-05 DIAGNOSIS — Z6838 Body mass index (BMI) 38.0-38.9, adult: Secondary | ICD-10-CM

## 2011-09-05 DIAGNOSIS — J9819 Other pulmonary collapse: Secondary | ICD-10-CM | POA: Diagnosis not present

## 2011-09-05 DIAGNOSIS — Z79899 Other long term (current) drug therapy: Secondary | ICD-10-CM

## 2011-09-05 DIAGNOSIS — Z09 Encounter for follow-up examination after completed treatment for conditions other than malignant neoplasm: Secondary | ICD-10-CM | POA: Diagnosis not present

## 2011-09-05 DIAGNOSIS — I2510 Atherosclerotic heart disease of native coronary artery without angina pectoris: Principal | ICD-10-CM | POA: Diagnosis present

## 2011-09-05 DIAGNOSIS — E669 Obesity, unspecified: Secondary | ICD-10-CM | POA: Diagnosis present

## 2011-09-05 DIAGNOSIS — R0602 Shortness of breath: Secondary | ICD-10-CM | POA: Diagnosis not present

## 2011-09-05 DIAGNOSIS — I10 Essential (primary) hypertension: Secondary | ICD-10-CM | POA: Diagnosis present

## 2011-09-05 HISTORY — PX: CORONARY ARTERY BYPASS GRAFT: SHX141

## 2011-09-05 LAB — POCT I-STAT 3, ART BLOOD GAS (G3+)
Acid-Base Excess: 1 mmol/L (ref 0.0–2.0)
Acid-base deficit: 1 mmol/L (ref 0.0–2.0)
Acid-base deficit: 5 mmol/L — ABNORMAL HIGH (ref 0.0–2.0)
Acid-base deficit: 5 mmol/L — ABNORMAL HIGH (ref 0.0–2.0)
Bicarbonate: 27 mEq/L — ABNORMAL HIGH (ref 20.0–24.0)
Bicarbonate: 23.6 mEq/L (ref 20.0–24.0)
Bicarbonate: 18.7 mEq/L — ABNORMAL LOW (ref 20.0–24.0)
Bicarbonate: 20 mEq/L (ref 20.0–24.0)
O2 Saturation: 100 %
O2 Saturation: 97 %
O2 Saturation: 98 %
O2 Saturation: 98 %
Patient temperature: 36.5
Patient temperature: 37.1
Patient temperature: 37.3
TCO2: 28 mmol/L (ref 0–100)
TCO2: 25 mmol/L (ref 0–100)
TCO2: 20 mmol/L (ref 0–100)
TCO2: 21 mmol/L (ref 0–100)
pCO2 arterial: 48 mmHg — ABNORMAL HIGH (ref 35.0–45.0)
pCO2 arterial: 37.8 mmHg (ref 35.0–45.0)
pCO2 arterial: 30.4 mmHg — ABNORMAL LOW (ref 35.0–45.0)
pCO2 arterial: 34 mmHg — ABNORMAL LOW (ref 35.0–45.0)
pH, Arterial: 7.359 (ref 7.350–7.400)
pH, Arterial: 7.401 — ABNORMAL HIGH (ref 7.350–7.400)
pH, Arterial: 7.398 (ref 7.350–7.400)
pH, Arterial: 7.378 (ref 7.350–7.400)
pO2, Arterial: 256 mmHg — ABNORMAL HIGH (ref 80.0–100.0)
pO2, Arterial: 89 mmHg (ref 80.0–100.0)
pO2, Arterial: 108 mmHg — ABNORMAL HIGH (ref 80.0–100.0)
pO2, Arterial: 105 mmHg — ABNORMAL HIGH (ref 80.0–100.0)

## 2011-09-05 LAB — CBC
HCT: 31.6 % — ABNORMAL LOW (ref 36.0–46.0)
HCT: 27.5 % — ABNORMAL LOW (ref 36.0–46.0)
Hemoglobin: 11 g/dL — ABNORMAL LOW (ref 12.0–15.0)
Hemoglobin: 9.6 g/dL — ABNORMAL LOW (ref 12.0–15.0)
MCH: 30.2 pg (ref 26.0–34.0)
MCH: 29.9 pg (ref 26.0–34.0)
MCHC: 34.8 g/dL (ref 30.0–36.0)
MCHC: 34.9 g/dL (ref 30.0–36.0)
MCV: 86.8 fL (ref 78.0–100.0)
MCV: 85.7 fL (ref 78.0–100.0)
Platelets: 205 10*3/uL (ref 150–400)
Platelets: 211 10*3/uL (ref 150–400)
RBC: 3.64 MIL/uL — ABNORMAL LOW (ref 3.87–5.11)
RBC: 3.21 MIL/uL — ABNORMAL LOW (ref 3.87–5.11)
RDW: 13.5 % (ref 11.5–15.5)
RDW: 13.4 % (ref 11.5–15.5)
WBC: 14.5 10*3/uL — ABNORMAL HIGH (ref 4.0–10.5)
WBC: 14.1 10*3/uL — ABNORMAL HIGH (ref 4.0–10.5)

## 2011-09-05 LAB — POCT I-STAT 4, (NA,K, GLUC, HGB,HCT)
Glucose, Bld: 181 mg/dL — ABNORMAL HIGH (ref 70–99)
Glucose, Bld: 130 mg/dL — ABNORMAL HIGH (ref 70–99)
Glucose, Bld: 105 mg/dL — ABNORMAL HIGH (ref 70–99)
Glucose, Bld: 110 mg/dL — ABNORMAL HIGH (ref 70–99)
Glucose, Bld: 161 mg/dL — ABNORMAL HIGH (ref 70–99)
Glucose, Bld: 149 mg/dL — ABNORMAL HIGH (ref 70–99)
HCT: 32 % — ABNORMAL LOW (ref 36.0–46.0)
HCT: 30 % — ABNORMAL LOW (ref 36.0–46.0)
HCT: 20 % — ABNORMAL LOW (ref 36.0–46.0)
HCT: 23 % — ABNORMAL LOW (ref 36.0–46.0)
HCT: 22 % — ABNORMAL LOW (ref 36.0–46.0)
HCT: 32 % — ABNORMAL LOW (ref 36.0–46.0)
Hemoglobin: 10.9 g/dL — ABNORMAL LOW (ref 12.0–15.0)
Hemoglobin: 10.2 g/dL — ABNORMAL LOW (ref 12.0–15.0)
Hemoglobin: 6.8 g/dL — CL (ref 12.0–15.0)
Hemoglobin: 7.8 g/dL — ABNORMAL LOW (ref 12.0–15.0)
Hemoglobin: 7.5 g/dL — ABNORMAL LOW (ref 12.0–15.0)
Hemoglobin: 10.9 g/dL — ABNORMAL LOW (ref 12.0–15.0)
Potassium: 3.6 mEq/L (ref 3.5–5.1)
Potassium: 3.4 mEq/L — ABNORMAL LOW (ref 3.5–5.1)
Potassium: 3.3 mEq/L — ABNORMAL LOW (ref 3.5–5.1)
Potassium: 4.3 mEq/L (ref 3.5–5.1)
Potassium: 3.9 mEq/L (ref 3.5–5.1)
Potassium: 3.5 mEq/L (ref 3.5–5.1)
Sodium: 139 mEq/L (ref 135–145)
Sodium: 140 mEq/L (ref 135–145)
Sodium: 137 mEq/L (ref 135–145)
Sodium: 137 mEq/L (ref 135–145)
Sodium: 136 mEq/L (ref 135–145)
Sodium: 138 mEq/L (ref 135–145)

## 2011-09-05 LAB — APTT: aPTT: 34 seconds (ref 24–37)

## 2011-09-05 LAB — GLUCOSE, CAPILLARY
Glucose-Capillary: 173 mg/dL — ABNORMAL HIGH (ref 70–99)
Glucose-Capillary: 129 mg/dL — ABNORMAL HIGH (ref 70–99)
Glucose-Capillary: 108 mg/dL — ABNORMAL HIGH (ref 70–99)
Glucose-Capillary: 105 mg/dL — ABNORMAL HIGH (ref 70–99)
Glucose-Capillary: 147 mg/dL — ABNORMAL HIGH (ref 70–99)
Glucose-Capillary: 148 mg/dL — ABNORMAL HIGH (ref 70–99)
Glucose-Capillary: 129 mg/dL — ABNORMAL HIGH (ref 70–99)
Glucose-Capillary: 104 mg/dL — ABNORMAL HIGH (ref 70–99)

## 2011-09-05 LAB — MAGNESIUM: Magnesium: 2.6 mg/dL — ABNORMAL HIGH (ref 1.5–2.5)

## 2011-09-05 LAB — HEMOGLOBIN AND HEMATOCRIT, BLOOD
HCT: 19.7 % — ABNORMAL LOW (ref 36.0–46.0)
Hemoglobin: 6.8 g/dL — CL (ref 12.0–15.0)

## 2011-09-05 LAB — CREATININE, SERUM
Creatinine, Ser: 0.96 mg/dL (ref 0.50–1.10)
GFR calc Af Amer: 70 mL/min — ABNORMAL LOW (ref >90)
GFR calc non Af Amer: 60 mL/min — ABNORMAL LOW (ref >90)

## 2011-09-05 LAB — PROTIME-INR
INR: 1.2 (ref 0.00–1.49)
Prothrombin Time: 15.5 seconds — ABNORMAL HIGH (ref 11.6–15.2)

## 2011-09-05 LAB — POCT I-STAT GLUCOSE
Glucose, Bld: 141 mg/dL — ABNORMAL HIGH (ref 70–99)
Operator id: 284731

## 2011-09-05 LAB — PLATELET COUNT: Platelets: 176 10*3/uL (ref 150–400)

## 2011-09-05 SURGERY — CORONARY ARTERY BYPASS GRAFTING (CABG)
Anesthesia: General | Site: Chest | Wound class: Clean

## 2011-09-05 MED ORDER — SODIUM CHLORIDE 0.9 % IJ SOLN
OROMUCOSAL | Status: DC | PRN
  Administered 2011-09-05 (×3): via TOPICAL

## 2011-09-05 MED ORDER — METOPROLOL TARTRATE 1 MG/ML IV SOLN: 2.5000 mg | INTRAVENOUS | Status: DC | PRN

## 2011-09-05 MED ORDER — ALBUMIN HUMAN 5 % IV SOLN
250.0000 mL | INTRAVENOUS | Status: AC | PRN
  Administered 2011-09-05 (×3): 250 mL via INTRAVENOUS
  Filled 2011-09-05: qty 250

## 2011-09-05 MED ORDER — MIDAZOLAM HCL 5 MG/5ML IJ SOLN
INTRAMUSCULAR | Status: DC | PRN
  Administered 2011-09-05 (×2): 2 mg via INTRAVENOUS
  Administered 2011-09-05: 1 mg via INTRAVENOUS
  Administered 2011-09-05: 2 mg via INTRAVENOUS

## 2011-09-05 MED ORDER — MORPHINE SULFATE 4 MG/ML IJ SOLN
2.0000 mg | INTRAMUSCULAR | Status: DC | PRN
  Administered 2011-09-05 (×2): 2 mg via INTRAVENOUS
  Administered 2011-09-06: 4 mg via INTRAVENOUS
  Administered 2011-09-06: 2 mg via INTRAVENOUS
  Filled 2011-09-05 (×3): qty 1

## 2011-09-05 MED ORDER — SODIUM CHLORIDE 0.9 % IJ SOLN
3.0000 mL | INTRAMUSCULAR | Status: DC | PRN
  Administered 2011-09-06: 3 mL via INTRAVENOUS

## 2011-09-05 MED ORDER — DOCUSATE SODIUM 100 MG PO CAPS
200.0000 mg | ORAL_CAPSULE | Freq: Every day | ORAL | Status: DC
  Administered 2011-09-06 – 2011-09-11 (×6): 200 mg via ORAL
  Filled 2011-09-05 (×6): qty 2

## 2011-09-05 MED ORDER — MIDAZOLAM HCL 2 MG/2ML IJ SOLN: 2.0000 mg | INTRAMUSCULAR | Status: DC | PRN

## 2011-09-05 MED ORDER — VANCOMYCIN HCL 1000 MG IV SOLR
1000.0000 mg | Freq: Once | INTRAVENOUS | Status: AC
  Administered 2011-09-05: 1000 mg via INTRAVENOUS
  Filled 2011-09-05: qty 1000

## 2011-09-05 MED ORDER — LACTATED RINGERS IV SOLN
INTRAVENOUS | Status: DC | PRN
  Administered 2011-09-05 (×2): via INTRAVENOUS

## 2011-09-05 MED ORDER — ACETAMINOPHEN 160 MG/5ML PO SOLN
975.0000 mg | Freq: Four times a day (QID) | ORAL | Status: AC
  Filled 2011-09-05: qty 40.6

## 2011-09-05 MED ORDER — PROTAMINE SULFATE 10 MG/ML IV SOLN
INTRAVENOUS | Status: DC | PRN
  Administered 2011-09-05: 180 mg via INTRAVENOUS
  Administered 2011-09-05: 25 mg via INTRAVENOUS
  Administered 2011-09-05: 20 mg via INTRAVENOUS

## 2011-09-05 MED ORDER — DOPAMINE-DEXTROSE 3.2-5 MG/ML-% IV SOLN: 0.0000 ug/kg/min | INTRAVENOUS | Status: DC

## 2011-09-05 MED ORDER — ONDANSETRON HCL 4 MG/2ML IJ SOLN
4.0000 mg | Freq: Four times a day (QID) | INTRAMUSCULAR | Status: DC | PRN
  Administered 2011-09-05: 4 mg via INTRAVENOUS
  Filled 2011-09-05: qty 2

## 2011-09-05 MED ORDER — SODIUM CHLORIDE 0.9 % IV SOLN
250.0000 mL | INTRAVENOUS | Status: DC
  Administered 2011-09-07: 250 mL via INTRAVENOUS

## 2011-09-05 MED ORDER — INSULIN REGULAR BOLUS VIA INFUSION
0.0000 [IU] | Freq: Three times a day (TID) | INTRAVENOUS | Status: DC
  Administered 2011-09-06 (×2): 0 [IU] via INTRAVENOUS

## 2011-09-05 MED ORDER — ASPIRIN 81 MG PO CHEW: 324.0000 mg | CHEWABLE_TABLET | Freq: Every day | ORAL | Status: DC

## 2011-09-05 MED ORDER — 0.9 % SODIUM CHLORIDE (POUR BTL) OPTIME
TOPICAL | Status: DC | PRN
  Administered 2011-09-05: 6000 mL

## 2011-09-05 MED ORDER — BISACODYL 5 MG PO TBEC
10.0000 mg | DELAYED_RELEASE_TABLET | Freq: Every day | ORAL | Status: DC
  Administered 2011-09-06 – 2011-09-11 (×5): 10 mg via ORAL
  Filled 2011-09-05: qty 2
  Filled 2011-09-05: qty 1
  Filled 2011-09-05: qty 2
  Filled 2011-09-05: qty 1
  Filled 2011-09-05 (×2): qty 2

## 2011-09-05 MED ORDER — OXYCODONE HCL 5 MG PO TABS
5.0000 mg | ORAL_TABLET | ORAL | Status: DC | PRN
  Administered 2011-09-06 (×2): 5 mg via ORAL
  Administered 2011-09-07 – 2011-09-11 (×6): 10 mg via ORAL
  Filled 2011-09-05: qty 2
  Filled 2011-09-05: qty 1
  Filled 2011-09-05 (×5): qty 2
  Filled 2011-09-05: qty 1

## 2011-09-05 MED ORDER — FAMOTIDINE IN NACL 20-0.9 MG/50ML-% IV SOLN
20.0000 mg | Freq: Two times a day (BID) | INTRAVENOUS | Status: DC
  Administered 2011-09-05: 20 mg via INTRAVENOUS

## 2011-09-05 MED ORDER — SODIUM CHLORIDE 0.9 % IV SOLN
0.1000 ug/kg/h | INTRAVENOUS | Status: DC
  Filled 2011-09-05 (×2): qty 2

## 2011-09-05 MED ORDER — SODIUM CHLORIDE 0.9 % IV SOLN: INTRAVENOUS | Status: DC

## 2011-09-05 MED ORDER — PHENYLEPHRINE HCL 10 MG/ML IJ SOLN
0.0000 ug/min | INTRAVENOUS | Status: DC
  Administered 2011-09-06: 25 ug/min via INTRAVENOUS
  Filled 2011-09-05: qty 2

## 2011-09-05 MED ORDER — VECURONIUM BROMIDE 10 MG IV SOLR
INTRAVENOUS | Status: DC | PRN
  Administered 2011-09-05 (×3): 2 mg via INTRAVENOUS
  Administered 2011-09-05: 4 mg via INTRAVENOUS
  Administered 2011-09-05: 3 mg via INTRAVENOUS
  Administered 2011-09-05: 4 mg via INTRAVENOUS

## 2011-09-05 MED ORDER — ROCURONIUM BROMIDE 100 MG/10ML IV SOLN
INTRAVENOUS | Status: DC | PRN
  Administered 2011-09-05: 50 mg via INTRAVENOUS

## 2011-09-05 MED ORDER — SIMVASTATIN 5 MG PO TABS
5.0000 mg | ORAL_TABLET | Freq: Every day | ORAL | Status: DC
  Administered 2011-09-06 – 2011-09-10 (×5): 5 mg via ORAL
  Filled 2011-09-05 (×8): qty 1

## 2011-09-05 MED ORDER — CALCIUM CHLORIDE 10 % IV SOLN
INTRAVENOUS | Status: DC | PRN
  Administered 2011-09-05: .1 g via INTRAVENOUS
  Administered 2011-09-05: .2 g via INTRAVENOUS

## 2011-09-05 MED ORDER — ACETAMINOPHEN 160 MG/5ML PO SOLN: 650.0000 mg | ORAL | Status: AC

## 2011-09-05 MED ORDER — BISACODYL 10 MG RE SUPP: 10.0000 mg | Freq: Every day | RECTAL | Status: DC

## 2011-09-05 MED ORDER — PANTOPRAZOLE SODIUM 40 MG PO TBEC
40.0000 mg | DELAYED_RELEASE_TABLET | Freq: Every day | ORAL | Status: DC
  Administered 2011-09-07 – 2011-09-11 (×5): 40 mg via ORAL
  Filled 2011-09-05 (×5): qty 1

## 2011-09-05 MED ORDER — HEMOSTATIC AGENTS (NO CHARGE) OPTIME
TOPICAL | Status: DC | PRN
  Administered 2011-09-05: 1 via TOPICAL

## 2011-09-05 MED ORDER — ASPIRIN EC 325 MG PO TBEC
325.0000 mg | DELAYED_RELEASE_TABLET | Freq: Every day | ORAL | Status: DC
  Administered 2011-09-06 – 2011-09-11 (×6): 325 mg via ORAL
  Filled 2011-09-05 (×6): qty 1

## 2011-09-05 MED ORDER — FENTANYL CITRATE 0.05 MG/ML IJ SOLN
INTRAMUSCULAR | Status: DC | PRN
  Administered 2011-09-05: 50 ug via INTRAVENOUS
  Administered 2011-09-05: 100 ug via INTRAVENOUS
  Administered 2011-09-05: 50 ug via INTRAVENOUS
  Administered 2011-09-05: 600 ug via INTRAVENOUS
  Administered 2011-09-05: 150 ug via INTRAVENOUS
  Administered 2011-09-05: 100 ug via INTRAVENOUS
  Administered 2011-09-05: 50 ug via INTRAVENOUS
  Administered 2011-09-05: 150 ug via INTRAVENOUS
  Administered 2011-09-05: 250 ug via INTRAVENOUS

## 2011-09-05 MED ORDER — ACETAMINOPHEN 650 MG RE SUPP
650.0000 mg | RECTAL | Status: AC
  Administered 2011-09-05: 650 mg via RECTAL

## 2011-09-05 MED ORDER — POTASSIUM CHLORIDE 10 MEQ/50ML IV SOLN
10.0000 meq | INTRAVENOUS | Status: AC
  Administered 2011-09-05 (×3): 10 meq via INTRAVENOUS

## 2011-09-05 MED ORDER — 0.9 % SODIUM CHLORIDE (POUR BTL) OPTIME
TOPICAL | Status: DC | PRN
  Administered 2011-09-05: 1000 mL

## 2011-09-05 MED ORDER — HEPARIN SODIUM (PORCINE) 1000 UNIT/ML IJ SOLN
INTRAMUSCULAR | Status: DC | PRN
  Administered 2011-09-05: 30000 [IU] via INTRAVENOUS

## 2011-09-05 MED ORDER — ALLOPURINOL 100 MG PO TABS
100.0000 mg | ORAL_TABLET | Freq: Every day | ORAL | Status: DC
  Administered 2011-09-06 – 2011-09-11 (×6): 100 mg via ORAL
  Filled 2011-09-05 (×6): qty 1

## 2011-09-05 MED ORDER — SODIUM CHLORIDE 0.9 % IV SOLN
INTRAVENOUS | Status: AC
  Administered 2011-09-06: 3.2 [IU]/h via INTRAVENOUS
  Filled 2011-09-05: qty 1

## 2011-09-05 MED ORDER — SODIUM CHLORIDE 0.9 % IV SOLN
10.0000 g | INTRAVENOUS | Status: DC | PRN
  Administered 2011-09-05: 5 g/h via INTRAVENOUS

## 2011-09-05 MED ORDER — PLASMA-LYTE 148 IV SOLN
INTRAVENOUS | Status: DC
  Filled 2011-09-05: qty 0.5

## 2011-09-05 MED ORDER — SODIUM CHLORIDE 0.9 % IJ SOLN
3.0000 mL | Freq: Two times a day (BID) | INTRAMUSCULAR | Status: DC
  Administered 2011-09-06 – 2011-09-07 (×3): 3 mL via INTRAVENOUS

## 2011-09-05 MED ORDER — LACTATED RINGERS IV SOLN
INTRAVENOUS | Status: DC
  Administered 2011-09-05: 22:00:00 via INTRAVENOUS

## 2011-09-05 MED ORDER — DEXTROSE 5 % IV SOLN
1.5000 g | Freq: Two times a day (BID) | INTRAVENOUS | Status: DC
  Administered 2011-09-06 – 2011-09-07 (×3): 1.5 g via INTRAVENOUS
  Filled 2011-09-05 (×4): qty 1.5

## 2011-09-05 MED ORDER — SODIUM CHLORIDE 0.45 % IV SOLN: INTRAVENOUS | Status: DC

## 2011-09-05 MED ORDER — PROPOFOL 10 MG/ML IV EMUL
INTRAVENOUS | Status: DC | PRN
  Administered 2011-09-05: 60 mg via INTRAVENOUS

## 2011-09-05 MED ORDER — ACETAMINOPHEN 500 MG PO TABS
1000.0000 mg | ORAL_TABLET | Freq: Four times a day (QID) | ORAL | Status: AC
  Administered 2011-09-06 – 2011-09-10 (×17): 1000 mg via ORAL
  Filled 2011-09-05 (×18): qty 2

## 2011-09-05 MED ORDER — LACTATED RINGERS IV SOLN
500.0000 mL | Freq: Once | INTRAVENOUS | Status: AC | PRN
  Administered 2011-09-05: 500 mL via INTRAVENOUS

## 2011-09-05 MED ORDER — METOPROLOL TARTRATE 25 MG/10 ML ORAL SUSPENSION
12.5000 mg | Freq: Two times a day (BID) | ORAL | Status: DC
  Filled 2011-09-05 (×13): qty 5

## 2011-09-05 MED ORDER — METOPROLOL TARTRATE 12.5 MG HALF TABLET
12.5000 mg | ORAL_TABLET | Freq: Two times a day (BID) | ORAL | Status: DC
  Administered 2011-09-08 – 2011-09-10 (×7): 12.5 mg via ORAL
  Filled 2011-09-05 (×14): qty 1

## 2011-09-05 MED ORDER — SODIUM CHLORIDE 0.9 % IV SOLN
INTRAVENOUS | Status: DC | PRN
  Administered 2011-09-05: 08:00:00 via INTRAVENOUS

## 2011-09-05 MED ORDER — NITROGLYCERIN IN D5W 200-5 MCG/ML-% IV SOLN: 0.0000 ug/min | INTRAVENOUS | Status: DC

## 2011-09-05 MED ORDER — MORPHINE SULFATE 2 MG/ML IJ SOLN
1.0000 mg | INTRAMUSCULAR | Status: AC | PRN
  Administered 2011-09-05: 2 mg via INTRAVENOUS
  Filled 2011-09-05 (×2): qty 1

## 2011-09-05 MED ORDER — MAGNESIUM SULFATE 40 MG/ML IJ SOLN
4.0000 g | Freq: Once | INTRAMUSCULAR | Status: AC
  Administered 2011-09-05: 4 g via INTRAVENOUS
  Filled 2011-09-05: qty 100

## 2011-09-05 MED ORDER — PAPAVERINE HCL 30 MG/ML IJ SOLN
INTRAMUSCULAR | Status: DC | PRN
  Administered 2011-09-05: 60 mg via INTRAVENOUS

## 2011-09-05 SURGICAL SUPPLY — 121 items
ATTRACTOMAT 16X20 MAGNETIC DRP (DRAPES) ×2 IMPLANT
BAG DECANTER FOR FLEXI CONT (MISCELLANEOUS) ×3 IMPLANT
BANDAGE ELASTIC 4 VELCRO ST LF (GAUZE/BANDAGES/DRESSINGS) ×3 IMPLANT
BANDAGE ELASTIC 6 VELCRO ST LF (GAUZE/BANDAGES/DRESSINGS) ×3 IMPLANT
BANDAGE GAUZE ELAST BULKY 4 IN (GAUZE/BANDAGES/DRESSINGS) ×3 IMPLANT
BLADE SAW STERNAL (BLADE) ×2 IMPLANT
BLADE SURG ROTATE 9660 (MISCELLANEOUS) ×1 IMPLANT
CANISTER SUCTION 2500CC (MISCELLANEOUS) ×2 IMPLANT
CANN PRFSN .5XCNCT 15X34-48 (MISCELLANEOUS) ×2
CANNULA AORTIC ROOT 20012 (MISCELLANEOUS) ×1 IMPLANT
CANNULA PRFSN .5XCNCT 15X34-48 (MISCELLANEOUS) ×1 IMPLANT
CANNULA VEN 2 STAGE (MISCELLANEOUS) ×5 IMPLANT
CANNULA VESSEL W/WING WO/VALVE (CANNULA) IMPLANT
CATH CPB KIT GERHARDT (MISCELLANEOUS) ×2 IMPLANT
CATH ROBINSON RED A/P 18FR (CATHETERS) IMPLANT
CATH THORACIC 28FR (CATHETERS) ×3 IMPLANT
CATH THORACIC 28FR RT ANG (CATHETERS) IMPLANT
CATH THORACIC 36FR (CATHETERS) IMPLANT
CATH THORACIC 36FR RT ANG (CATHETERS) IMPLANT
CLIP FOGARTY SPRING 6M (CLIP) IMPLANT
CLIP RETRACTION 3.0MM CORONARY (MISCELLANEOUS) ×1 IMPLANT
CLIP TI MEDIUM 24 (CLIP) IMPLANT
CLIP TI WIDE RED SMALL 24 (CLIP) IMPLANT
CLOTH BEACON ORANGE TIMEOUT ST (SAFETY) ×2 IMPLANT
CORONARY SUCKER SOFT TIP 10052 (MISCELLANEOUS) ×1 IMPLANT
COVER SURGICAL LIGHT HANDLE (MISCELLANEOUS) ×4 IMPLANT
CRADLE DONUT ADULT HEAD (MISCELLANEOUS) ×2 IMPLANT
DRAIN CHANNEL 28F RND 3/8 FF (WOUND CARE) ×2 IMPLANT
DRAIN CHANNEL 32F RND 10.7 FF (WOUND CARE) IMPLANT
DRAPE CARDIOVASCULAR INCISE (DRAPES) ×2
DRAPE SLUSH MACHINE 52X66 (DRAPES) IMPLANT
DRAPE SLUSH/WARMER DISC (DRAPES) ×1 IMPLANT
DRAPE SRG 135X102X78XABS (DRAPES) ×1 IMPLANT
DRSG COVADERM 4X14 (GAUZE/BANDAGES/DRESSINGS) ×2 IMPLANT
ELECT BLADE 4.0 EZ CLEAN MEGAD (MISCELLANEOUS) ×2
ELECT CAUTERY BLADE 6.4 (BLADE) ×2 IMPLANT
ELECT REM PT RETURN 9FT ADLT (ELECTROSURGICAL) ×4
ELECTRODE BLDE 4.0 EZ CLN MEGD (MISCELLANEOUS) ×1 IMPLANT
ELECTRODE REM PT RTRN 9FT ADLT (ELECTROSURGICAL) ×2 IMPLANT
GLOVE BIO SURGEON STRL SZ 6 (GLOVE) ×3 IMPLANT
GLOVE BIO SURGEON STRL SZ 6.5 (GLOVE) ×9 IMPLANT
GLOVE BIO SURGEON STRL SZ7 (GLOVE) IMPLANT
GLOVE BIO SURGEON STRL SZ7.5 (GLOVE) IMPLANT
GLOVE BIOGEL PI IND STRL 6 (GLOVE) IMPLANT
GLOVE BIOGEL PI IND STRL 6.5 (GLOVE) IMPLANT
GLOVE BIOGEL PI IND STRL 7.0 (GLOVE) IMPLANT
GLOVE BIOGEL PI INDICATOR 6 (GLOVE) ×2
GLOVE BIOGEL PI INDICATOR 6.5 (GLOVE) ×2
GLOVE BIOGEL PI INDICATOR 7.0 (GLOVE) ×4
GLOVE EUDERMIC 7 POWDERFREE (GLOVE) IMPLANT
GLOVE ORTHO TXT STRL SZ7.5 (GLOVE) IMPLANT
GLOVE SURG EUDERMIC 8 LTX PF (GLOVE) ×2 IMPLANT
GOWN PREVENTION PLUS XLARGE (GOWN DISPOSABLE) ×3 IMPLANT
GOWN STRL NON-REIN LRG LVL3 (GOWN DISPOSABLE) ×11 IMPLANT
HEMOSTAT POWDER SURGIFOAM 1G (HEMOSTASIS) ×6 IMPLANT
HEMOSTAT SURGICEL 2X14 (HEMOSTASIS) ×2 IMPLANT
INSERT FOGARTY 61MM (MISCELLANEOUS) IMPLANT
INSERT FOGARTY XLG (MISCELLANEOUS) IMPLANT
KIT BASIN OR (CUSTOM PROCEDURE TRAY) ×2 IMPLANT
KIT ROOM TURNOVER OR (KITS) ×2 IMPLANT
KIT SUCTION CATH 14FR (SUCTIONS) ×4 IMPLANT
KIT VASOVIEW W/TROCAR VH 2000 (KITS) ×2 IMPLANT
LEAD MYOCARDIAL PACING LEAD ×2 IMPLANT
LEAD PACING MYOCARDI (MISCELLANEOUS) ×5 IMPLANT
MARKER GRAFT CORONARY BYPASS (MISCELLANEOUS) ×6 IMPLANT
NS IRRIG 1000ML POUR BTL (IV SOLUTION) ×12 IMPLANT
PACK OPEN HEART (CUSTOM PROCEDURE TRAY) ×2 IMPLANT
PAD ARMBOARD 7.5X6 YLW CONV (MISCELLANEOUS) ×4 IMPLANT
PENCIL BUTTON HOLSTER BLD 10FT (ELECTRODE) ×2 IMPLANT
PUNCH AORTIC ROTATE 4.0MM (MISCELLANEOUS) IMPLANT
PUNCH AORTIC ROTATE 4.5MM 8IN (MISCELLANEOUS) ×1 IMPLANT
PUNCH AORTIC ROTATE 5MM 8IN (MISCELLANEOUS) IMPLANT
SET CARDIOPLEGIA MPS 5001102 (MISCELLANEOUS) ×1 IMPLANT
SOLUTION ANTI FOG 6CC (MISCELLANEOUS) IMPLANT
SPONGE GAUZE 4X4 12PLY (GAUZE/BANDAGES/DRESSINGS) ×4 IMPLANT
SPONGE INTESTINAL PEANUT (DISPOSABLE) IMPLANT
SPONGE LAP 18X18 X RAY DECT (DISPOSABLE) ×4 IMPLANT
SPONGE LAP 4X18 X RAY DECT (DISPOSABLE) IMPLANT
SUT BONE WAX W31G (SUTURE) ×1 IMPLANT
SUT MNCRL AB 4-0 PS2 18 (SUTURE) ×2 IMPLANT
SUT PROLENE 3 0 SH 1 (SUTURE) ×2 IMPLANT
SUT PROLENE 3 0 SH DA (SUTURE) IMPLANT
SUT PROLENE 3 0 SH1 36 (SUTURE) IMPLANT
SUT PROLENE 4 0 RB 1 (SUTURE)
SUT PROLENE 4 0 SH DA (SUTURE) IMPLANT
SUT PROLENE 4 0 TF (SUTURE) ×2 IMPLANT
SUT PROLENE 4-0 RB1 .5 CRCL 36 (SUTURE) IMPLANT
SUT PROLENE 5 0 C 1 36 (SUTURE) IMPLANT
SUT PROLENE 6 0 C 1 30 (SUTURE) IMPLANT
SUT PROLENE 6 0 CC (SUTURE) ×6 IMPLANT
SUT PROLENE 7 0 BV 1 (SUTURE) ×1 IMPLANT
SUT PROLENE 7 0 BV1 MDA (SUTURE) ×2 IMPLANT
SUT PROLENE 7.0 RB 3 (SUTURE) IMPLANT
SUT PROLENE 8 0 BV175 6 (SUTURE) IMPLANT
SUT SILK  1 MH (SUTURE)
SUT SILK 1 MH (SUTURE) IMPLANT
SUT SILK 2 0 SH CR/8 (SUTURE) IMPLANT
SUT SILK 3 0 SH CR/8 (SUTURE) IMPLANT
SUT STEEL 6MS V (SUTURE) ×1 IMPLANT
SUT STEEL STERNAL CCS#1 18IN (SUTURE) ×1 IMPLANT
SUT STEEL SZ 6 DBL 3X14 BALL (SUTURE) ×2 IMPLANT
SUT VIC AB 1 CTX 18 (SUTURE) ×4 IMPLANT
SUT VIC AB 1 CTX 36 (SUTURE)
SUT VIC AB 1 CTX36XBRD ANBCTR (SUTURE) IMPLANT
SUT VIC AB 2-0 CT1 27 (SUTURE) ×4
SUT VIC AB 2-0 CT1 TAPERPNT 27 (SUTURE) IMPLANT
SUT VIC AB 2-0 CTX 27 (SUTURE) IMPLANT
SUT VIC AB 3-0 SH 27 (SUTURE)
SUT VIC AB 3-0 SH 27X BRD (SUTURE) IMPLANT
SUT VIC AB 3-0 X1 27 (SUTURE) IMPLANT
SUT VICRYL 4-0 PS2 18IN ABS (SUTURE) IMPLANT
SUTURE E-PAK OPEN HEART (SUTURE) ×2 IMPLANT
SYSTEM SAHARA CHEST DRAIN ATS (WOUND CARE) ×2 IMPLANT
TOWEL OR 17X24 6PK STRL BLUE (TOWEL DISPOSABLE) ×4 IMPLANT
TOWEL OR 17X26 10 PK STRL BLUE (TOWEL DISPOSABLE) ×4 IMPLANT
TRAY FOLEY IC TEMP SENS 14FR (CATHETERS) ×2 IMPLANT
TUBE FEEDING 8FR 16IN STR KANG (MISCELLANEOUS) ×2 IMPLANT
TUBE SUCT INTRACARD DLP 20F (MISCELLANEOUS) ×2 IMPLANT
TUBING INSUFFLATION 10FT LAP (TUBING) ×2 IMPLANT
UNDERPAD 30X30 INCONTINENT (UNDERPADS AND DIAPERS) ×2 IMPLANT
WATER STERILE IRR 1000ML POUR (IV SOLUTION) ×4 IMPLANT

## 2011-09-05 NOTE — Procedures (Signed)
Extubation Procedure Note  Patient Details:   Name: [name] DOB: 03-31-1946 MRN: [id number]   Airway Documentation:     Evaluation  O2 sats: stable throughout Complications: No apparent complications Patient did tolerate procedure well. Bilateral Breath Sounds: Coarse crackles   Yes  Newt Lukes 09/05/2011, 6:24 PM

## 2011-09-05 NOTE — Brief Op Note (Signed)
09/05/2011  1:57 PM  PATIENT:  [name]  66 y.o. female  PRE-OPERATIVE DIAGNOSIS:  CAD  POST-OPERATIVE DIAGNOSIS:  Coronary artery disease  PROCEDURE:  Procedure(s): CORONARY ARTERY BYPASS GRAFTING (CABG) x 3  SURGEON:  Surgeon(s): Delight Ovens, MD    ASSISTANTS:  Cecile Sheerer SA  ANESTHESIA:   general  EBL:  Total I/O In: 635 [I.V.:250; Blood:385] Out: 2450 [Urine:1100; Blood:1350]  BLOOD ADMINISTERED:350 CC PRBC and 385 CC CELLSAVER  DRAINS: Urinary Catheter (Foley), one Chest Tube(s) in the left chest and (one) Blake drain(s) in the mediastinum     COUNTS:  YES     PLAN OF CARE: Admit to inpatient   PATIENT DISPOSITION:  ICU - intubated and hemodynamically stable.   Delay start of Pharmacological VTE agent (>24hrs) due to surgical blood loss or risk of bleeding: yes

## 2011-09-05 NOTE — Transfer of Care (Signed)
Immediate Anesthesia Transfer of Care Note  Patient: [name]  Procedure(s) Performed:  CORONARY ARTERY BYPASS GRAFTING (CABG) - CABG times three using left internal mammary artery and left leg greater saphenous vein harvested endoscopically  Patient Location: SICU  Anesthesia Type: General  Level of Consciousness: sedated and Patient remains intubated per anesthesia plan  Airway & Oxygen Therapy: Patient remains intubated per anesthesia plan  Post-op Assessment: Report given to PACU RN and Post -op Vital signs reviewed and stable  Post vital signs: Reviewed and stable  Complications: No apparent anesthesia complications

## 2011-09-05 NOTE — Progress Notes (Signed)
Patient ID: [name], female   DOB: [birth year], 66 y.o.   MRN: [id number] Stable early post op Extubated on dopamine Not bleeding BP 100/52  Pulse 93  Temp(Src) 99.1 F (37.3 C) (Core (Comment))  Resp 23  Ht 5\' 3"  (1.6 m)  Wt 220 lb (99.791 kg)  BMI 38.97 kg/m2  SpO2 100%  Lab Results  Component Value Date   WBC 14.5* 09/05/2011   HGB 10.9* 09/05/2011   HCT 32.0* 09/05/2011   PLT 205 09/05/2011   GLUCOSE 149* 09/05/2011   ALT 23 09/03/2011   AST 19 09/03/2011   NA 138 09/05/2011   K 3.5 09/05/2011   CL 101 09/03/2011   CREATININE 1.07 09/03/2011   BUN 16 09/03/2011   CO2 25 09/03/2011   INR 1.20 09/05/2011   HGBA1C 7.1* 09/03/2011   Delight Ovens MD  Beeper [phone] Office [phone]

## 2011-09-05 NOTE — Plan of Care (Signed)
Problem: Phase II Progression Outcomes Goal: Patient extubated within - Outcome: Completed/Met Date Met:  09/05/11 Pt extubated within 6 hours of CABG.

## 2011-09-05 NOTE — Preoperative (Signed)
Beta Blockers   Reason not to administer Beta Blockers:Not Applicable 

## 2011-09-05 NOTE — H&P (Signed)
[address]            [place] [address]          [phone]      JESSICCA Bird [facility] Medical Record #[id number] Date of Birth: 1946/03/23  Referring:Dr Samuella Cota and DeGent Primary Care: Juliette Alcide, MD, MD  Chief Complaint:    No chief complaint on file.   History of Present Illness:    Patient diabetic with history of acute anterior MI treated with stent presents with new onset of exertional chest pain for the past 6 months. Mild exertional SOB,       Current Activity/ Functional Status: Patient is independent with mobility/ambulation, transfers, ADL's, IADL's.    Past Medical History  Diagnosis Date  . CAD (coronary artery disease)     Non-STEMI/Taxus stenting 100% left anterior descending (2), January 2008. Residual 70% circumflex;80% right coronary artery. Mild LVD (EF 45%);improved to 55-60% by 2-D echocardiogram,January 2009.   [name] Unspecified essential hypertension   . Dyslipidemia   . Obesity   . Lumbar herniated disc   . AV block, 1st degree   . DM (diabetes mellitus)  for 5 years  . Gout   History of evaluation by oncology for "lymphadonapaty, told she did not have lymphoma  Past Surgical History  Procedure Date  . Back surgery 2009/2010/2011  . Carpal tunnel release     bilateral  . Cholecystectomy not sure  . Breast biopsy unknown    left  . Leg tendon surgery unknown    left   . Abdominal hysterectomy   . Tonsillectomy   . Cardiac catheterization 2008    NON-STEMI/TAXUS STENTING 100% PROXIMAL LAD (X2) JANUARY 2008  . Colonoscopy     Family History  Problem Relation Age of Onset  . Heart attack Sister 70  . Heart attack Brother 38    Work: worked in [employer] [place] disabled  Carpel tunnel bilaterial  History  Smoking status  . Never Smoker   Smokeless tobacco  . Never Used    History  Alcohol Use: NO     No Known Allergies  Current Facility-Administered Medications    Medication Dose Route Frequency Provider Last Rate Last Dose  . aminocaproic acid (AMICAR) 10 g in sodium chloride 0.9 % 100 mL infusion   Intravenous To OR Delight Ovens, MD      . cefUROXime (ZINACEF) 1.5 g in dextrose 5 % 50 mL IVPB  1.5 g Intravenous To OR Delight Ovens, MD      . cefUROXime (ZINACEF) 750 mg in dextrose 5 % 50 mL IVPB  750 mg Intravenous To OR Delight Ovens, MD      . dexmedetomidine (PRECEDEX) 400 mcg in sodium chloride 0.9 % 100 mL infusion  0.1-0.7 mcg/kg/hr Intravenous To OR Delight Ovens, MD      . DOPamine (INTROPIN) 800 mg in dextrose 5 % 250 mL infusion  2-20 mcg/kg/min Intravenous To OR Delight Ovens, MD      . EPINEPHrine (ADRENALIN) 4,000 mcg in dextrose 5 % 250 mL infusion  0.5-20 mcg/min Intravenous To OR Delight Ovens, MD      . heparin 2,500 Units, papaverine 30 mg in electrolyte-148 (PLASMALYTE-148) 500 mL irrigation   Irrigation To OR Delight Ovens, MD      . heparin 2,500 Units, papaverine 30 mg in electrolyte-148 (  PLASMALYTE-148) 500 mL irrigation   Irrigation To OR Delight Ovens, MD      . insulin regular (NOVOLIN R,HUMULIN R) 1 Units/mL in sodium chloride 0.9 % 100 mL infusion   Intravenous To OR Delight Ovens, MD      . magnesium sulfate (IV Push/IM) injection 40 mEq  40 mEq Other To OR Delight Ovens, MD      . metoprolol tartrate (LOPRESSOR) tablet 12.5 mg  12.5 mg Oral Once Delight Ovens, MD      . nitroGLYCERIN 0.2 mg/mL in dextrose 5 % infusion  2-200 mcg/min Intravenous To OR Delight Ovens, MD      . phenylephrine (NEO-SYNEPHRINE) 20,000 mcg in dextrose 5 % 250 mL infusion  30-200 mcg/min Intravenous To OR Delight Ovens, MD      . potassium chloride injection 80 mEq  80 mEq Other To OR Delight Ovens, MD      . vancomycin (VANCOCIN) 1,500 mg in sodium chloride 0.9 % 250 mL IVPB  1,500 mg Intravenous To OR Delight Ovens, MD           Review of Systems:     Cardiac Review of Systems: Y or  N  Chest Pain [  y  ]  Resting SOB [n   ] Exertional SOB  [ y ]  Pollyann Kennedy Milo.Brash  ]   Pedal Edema [  n ]    Palpitations Milo.Brash  ] Syncope  [ n ]   Presyncope [ n  ]  General Review of Systems: [Y] = yes [  ]=no Constitional: recent weight change [n  ]; anorexia [  ]; fatigue [  ]; nausea [  ]; night sweats [  ]; fever [  ]; or chills [  ];                                                                                                                                          Dental: poor dentition[  ];   Eye : blurred vision [  ]; diplopia [   ]; vision changes [  ];  Amaurosis fugax[  ]; Resp: cough [  ];  wheezing[n  ];  hemoptysis[n  ]; shortness of breath[  ]; paroxysmal nocturnal dyspnea[  ]; dyspnea on exertion[  ]; or orthopnea[  ];  GI:  gallstones[  ], vomiting[  ];  dysphagia[  ]; melena[  ];  hematochezia [  ]; heartburn[  ];   Hx of  Colonoscopy[  ]; GU: kidney stones [  ]; hematuria[  ];   dysuria [  ];  nocturia[  ];  history of     obstruction [  ];             Skin: rash, swelling[  ];, hair loss[  ];  peripheral edema[  ];  or itching[  ];  Musculosketetal: myalgias[  ];  joint swelling[  ];  joint erythema[  ];  joint pain[  ];  back pain[  ];  Heme/Lymph: bruising[  ];  bleeding[  ];  anemia[  ];  Neuro: TIA[  ];  headaches[  ];  stroke[  ];  vertigo[  ];  seizures[  ];   paresthesias[  ];  difficulty walking[  ];  Psych:depression[  ]; anxiety[  ];  Endocrine: diabetes[  ];  thyroid dysfunction[  ];  Immunizations: Flu [?  ]; Pneumococcal[ ? ];  Other:  Physical Exam: BP 122/83  Pulse 88  Temp(Src) 98.7 F (37.1 C) (Oral)  Resp 18  Ht 5\' 3"  (1.6 m)  Wt 220 lb (99.791 kg)  BMI 38.97 kg/m2  SpO2 98%  General appearance: alert, cooperative, appears older than stated age and no distress Neurologic: intact Heart: regular rate and rhythm, S1, S2 normal, no murmur, click, rub or gallop Lungs: clear to auscultation bilaterally and normal percussion bilaterally Abdomen: soft,  non-tender; bowel sounds normal; no masses,  no organomegaly Extremities: extremities normal, atraumatic, no cyanosis or edema, Homans sign is negative, no sign of DVT, no edema, redness or tenderness in the calves or thighs and no ulcers, gangrene or trophic changes Palpable pedal pulses bilaterial pulses  Obese abdomen unable to palpated abdominal;l aorta  Diagnostic Studies & Laboratory data:     Recent Radiology Findings:   Dg Chest 2 View  09/03/2011  *RADIOLOGY REPORT*  Clinical Data: Pre cardiac surgery  CHEST - 2 VIEW  Comparison: CT chest pelvis 09/19/2010 chest radiograph 07/15/2009  Findings: Heart size is upper normal.  Coronary artery stent material is noted.  Atherosclerotic calcification thoracic aortic arch.  Pulmonary vascularity within normal limits.  The lungs are clear.  There is no pleural effusion.  The osteophytosis of the thoracic spine noted.  No acute bony abnormality.  Cholecystectomy clips.  IMPRESSION:  1.  No acute cardiopulmonary disease. 2.  Coronary artery stent.  Original Report Authenticated By: Britta Mccreedy, M.D.      Recent Lab Findings: Lab Results  Component Value Date   WBC 7.9 09/03/2011   HGB 12.8 09/03/2011   HCT 37.1 09/03/2011   PLT 284 09/03/2011   GLUCOSE 126* 09/03/2011   ALT 23 09/03/2011   AST 19 09/03/2011   NA 140 09/03/2011   K 3.8 09/03/2011   CL 101 09/03/2011   CREATININE 1.07 09/03/2011   BUN 16 09/03/2011   CO2 25 09/03/2011   INR 0.96 09/03/2011   HGBA1C 7.1* 09/03/2011   Clinical Data: Follow-up non-Hodgkins lymphoma. Right flank pain.  CT CHEST, ABDOMEN AND PELVIS WITH CONTRAST  Technique: Multidetector CT imaging of the chest, abdomen and  pelvis was performed following the standard protocol during bolus  administration of intravenous contrast.  Contrast: 100 ml Omnipaque-300 and oral contrast  Comparison: 03/03/2010  CT CHEST  Findings: No hilar or mediastinal masses are identified. No  evidence of lymphadenopathy elsewhere within the thorax.  No  evidence of pleural or pericardial effusion. Both lungs are clear.  No evidence of pulmonary infiltrate or mass. No suspicious bone  lesions identified.  IMPRESSION:  Negative. No evidence of recurrent lymphoma or other acute  findings.  CT ABDOMEN AND PELVIS  Findings: Hepatic steatosis again demonstrated as well as prior  cholecystectomy. No liver masses are identified. Spleen is normal  in size in appearance. The pancreas, adrenal glands, and kidneys  also show no significant abnormalities.  Mild lymphadenopathy in the  root of the small bowel mesentery and  abdominal retroperitoneum remains stable. The largest area of  adenopathy in the retrocaval space measures 1.6 x 2.9 cm and is  stable. No definite lymphadenopathy is seen within the pelvis.  Soft tissue density in the left adnexa with central cyst is  decreased since previous study and is consistent with the ovary.  Previous hysterectomy is noted. No new or increased areas of  lymphadenopathy are identified.  There is no evidence of inflammatory process or abnormal fluid  collections. Left colonic diverticulosis is noted, however there  is no evidence of diverticulitis. No evidence of bowel  obstruction. No suspicious bone lesions are identified.  IMPRESSION:  1. Stable mild abdominal retroperitoneal and mesenteric  lymphadenopathy.  2. No new or progressive disease within the abdomen or pelvis.  3. Hepatic steatosis.  Provider: Hinda Kehr, Darla Lesches, Cristin Willeen Cass  Cardiac Catheterization Procedure Note  Name: [name]  MRN: [id number]  DOB: 1946-01-16  Procedure: Left Heart Cath, Selective Coronary Angiography, LV angiography  Indication: This is a 66 year old female with known history of coronary artery disease status post angioplasty and 2 drug-eluting stent placements to the left anterior descending artery in 2008. She presented with progressive symptoms of exertional chest pain which was worrisome  for angina. Cardiac catheterization was thus recommended.  Medications:  Sedation: 1 mg IV Versed, 25 mcg IV Fentanyl  Contrast: 160 Omnipaque  Procedural details: The right groin was prepped, draped, and anesthetized with 1% lidocaine. Using modified Seldinger technique, a 4 French sheath was introduced into the right femoral artery. Standard Judkins catheters were used for coronary angiography and left ventriculography. However, I could not engage the left anterior descending artery which has a separate ostium from the left circumflex in spite of trying multiple catheters including JL4, JL 3.5, AL-1, AL2 and an AR-1. This artery was previously engaged with JL 3.0 catheter which we did not have in a 4 [place] system. Thus, I up sized sheath to a 5 [place] and used a JL 3.0 catheter. Nonselective angiography was performed and it appears that the LAD is occluded. Catheter exchanges were performed over a guidewire. There were no immediate procedural complications. The patient was transferred to the post catheterization recovery area for further monitoring.  Procedural Findings:  Hemodynamics:  AO: 126/66 mmHg  LV: 134/18 mmHg  LVEDP: 23 MmHg  Mild gradient across the aortic valve.  Coronary angiography:  Coronary dominance: Codominant.  Left Main: The LAD and circumflex originated from separate ostia.  Left Anterior Descending (LAD): The vessel is occluded at the ostium. 2 overlapped stents are noted in the proximal segment. The vessel fills via collaterals from the RCA as well as some collaterals from the left circumflex to the diagonals.  Circumflex (LCx): The vessel is normal in size. 30% stenosis is noted right after giving OM1. In the midsegment there is also 2 separate lesions each causing about 60% stenosis.  1st obtuse marginal: Overall large with a tubular 70% stenosis proximally.  2nd obtuse marginal: Small in size and free of significant disease.  3rd obtuse marginal: Small in size and free  of significant disease.  Right Coronary Artery: The vessel is normal in size. There is an 80% stenosis in the midsegment right after giving RV 1. There is diffuse 20% disease in the midsegment.  posterior descending artery: Normal in size and free of significant disease. It gives collaterals to the left anterior descending artery. Left ventriculography: Left ventricular systolic function is mildly  reduced , LVEF is estimated at 40-45 %, there is no significant mitral regurgitation . There is moderate anterior wall hypokinesis as well as dyskinesias of the apex.  Final Conclusions:  1. Significant three-vessel coronary artery disease with an occluded left anterior descending artery where 2 previous drug-eluting stents were placed.  2. Mildly reduced LV systolic function with an estimated ejection fraction of 40-45%.  3. Mild to moderate elevation in left ventricular end-diastolic pressure.  4. mild gradient across the aortic valve likely due to mild degree of stenosis.  Recommendations: I recommend CABG. Given her three-vessel coronary artery disease including an occluded left anterior descending artery with 2 previous drug-eluting stents were placed, mildly reduced LV systolic function and known history of diabetes, the patient will get the best long-term outcome from surgical revascularization versus PCI.  Lorine Bears MD, [facility]  08/22/2011, 11:59 AM  Summary of ECHO results 09/03/2011  Study Conclusions  Left ventricle: The cavity size was normal. Systolic function was normal. The estimated ejection fraction was in the range of 55% to 60%. There was an increased relative contribution of atrial contraction to ventricular filling. Transthoracic echocardiography. M-mode, complete 2D, spectral Doppler, and color Doppler. Height: Height: 160cm. Height: 63in. Weight: Weight: 97.5kg. Weight: 214.6lb. Body mass index: BMI: 38.1kg/m^2. Body surface area: BSA: 1.55m^2. Blood pressure: 126/67.  Patient status: Outpatient. Location: Echo laboratory.  ------------------------------------------------------------  ------------------------------------------------------------ Left ventricle: The cavity size was normal. Systolic function was normal. The estimated ejection fraction was in the range of 55% to 60%. Images were inadequate for LV wall motion assessment. There was an increased relative contribution of atrial contraction to ventricular filling.  ------------------------------------------------------------ Aortic valve: Structurally normal valve. Cusp separation was normal. Doppler: Transvalvular velocity was within the normal range. There was no stenosis. No regurgitation.  ------------------------------------------------------------ Aorta: The aorta was normal, not dilated, and non-diseased.  ------------------------------------------------------------ Mitral valve: Structurally normal valve. Leaflet separation was normal. Doppler: Transvalvular velocity was within the normal range. There was no evidence for stenosis. No regurgitation.  ------------------------------------------------------------ Left atrium: The atrium was normal in size.  ------------------------------------------------------------ Atrial septum: Poorly visualized.  ------------------------------------------------------------ Right ventricle: The cavity size was normal. Wall thickness was normal. Systolic function was normal.  ------------------------------------------------------------ Pulmonic valve: Doppler: Trivial regurgitation.  ------------------------------------------------------------ Tricuspid valve: Structurally normal valve. Leaflet separation was normal. Doppler: Transvalvular velocity was within the normal range. No regurgitation.  ------------------------------------------------------------ Pulmonary artery: Poorly  visualized.  ------------------------------------------------------------ Right atrium: Poorly visualized.  ------------------------------------------------------------ Pericardium: The pericardium was normal in appearance.  ------------------------------------------------------------ Post procedure conclusions Ascending Aorta:  - The aorta was normal, not dilated, and non-diseased.   Assessment / Plan:   New onset of exertional angina with 3 vessel CAD in diabetic female with thrombosed LAD stent  Question of Mild aortic Vale gradient, Murmur not appreciated. Has not had echocardiogram will obtain prior to CABG Have recommended CABG to patient, with 3 vessel CAD, in diabetic and with mod reduction of LV function Echo has been done 09/03/11  The goals risks and alternatives of the planned surgical procedure CABG have been discussed with the patient in detail. The risks of the procedure including death, infection, stroke, myocardial infarction, bleeding, blood transfusion have all been discussed specifically.  I have quoted Savreen H Bartz a 3% of perioperative mortality and a complication rate as high as 15 %. The patient's questions have been answered.Arlee H Eisenberger is willing  to proceed with the planned procedure.    Delight Ovens MD  Beeper [phone] Office [phone] 09/05/2011 8:29  AM

## 2011-09-05 NOTE — Anesthesia Preprocedure Evaluation (Addendum)
Anesthesia Evaluation  Patient identified by MRN, date of birth, ID band Patient awake    Reviewed: Allergy & Precautions, H&P , NPO status , Patient's Chart, lab work & pertinent test results  Airway Mallampati: I TM Distance: >3 FB Neck ROM: Full    Dental  (+) Missing, Dental Advisory Given and Chipped,    Pulmonary          Cardiovascular hypertension, + CAD and + Past MI + dysrhythmias regular Normal    Neuro/Psych Negative Neurological ROS  Negative Psych ROS   GI/Hepatic Neg liver ROS,   Endo/Other  Diabetes mellitus-, Type 2  Renal/GU negative Renal ROS  Genitourinary negative   Musculoskeletal   Abdominal   Peds  Hematology   Anesthesia Other Findings   Reproductive/Obstetrics                          Anesthesia Physical Anesthesia Plan  ASA: III  Anesthesia Plan: General   Post-op Pain Management:    Induction: Intravenous  Airway Management Planned: Oral ETT  Additional Equipment: Arterial line and PA Cath  Intra-op Plan:   Post-operative Plan: Post-operative intubation/ventilation  Informed Consent: I have reviewed the patients History and Physical, chart, labs and discussed the procedure including the risks, benefits and alternatives for the proposed anesthesia with the patient or authorized representative who has indicated his/her understanding and acceptance.     Plan Discussed with: CRNA, Anesthesiologist and Surgeon  Anesthesia Plan Comments:         Anesthesia Quick Evaluation

## 2011-09-05 NOTE — Anesthesia Postprocedure Evaluation (Signed)
  Anesthesia Post-op Note  Patient: [name]  Procedure(s) Performed:  CORONARY ARTERY BYPASS GRAFTING (CABG) - CABG times three using left internal mammary artery and left leg greater saphenous vein harvested endoscopically  Patient Location: SICU  Anesthesia Type: General  Level of Consciousness: sedated and Patient remains intubated per anesthesia plan  Airway and Oxygen Therapy: Patient remains intubated per anesthesia plan  Post-op Pain: unable to assess due to sedation  Post-op Assessment: Post-op Vital signs reviewed, Patient's Cardiovascular Status Stable and Respiratory Function Stable  Post-op Vital Signs: Reviewed and stable  Complications: No apparent anesthesia complications

## 2011-09-06 ENCOUNTER — Encounter (HOSPITAL_COMMUNITY): Payer: Self-pay | Admitting: Cardiothoracic Surgery

## 2011-09-06 ENCOUNTER — Other Ambulatory Visit: Payer: Self-pay

## 2011-09-06 ENCOUNTER — Inpatient Hospital Stay (HOSPITAL_COMMUNITY): Payer: Medicare Other

## 2011-09-06 DIAGNOSIS — I2510 Atherosclerotic heart disease of native coronary artery without angina pectoris: Secondary | ICD-10-CM | POA: Diagnosis not present

## 2011-09-06 DIAGNOSIS — J9819 Other pulmonary collapse: Secondary | ICD-10-CM | POA: Diagnosis not present

## 2011-09-06 LAB — CBC
HCT: 25.5 % — ABNORMAL LOW (ref 36.0–46.0)
HCT: 23.4 % — ABNORMAL LOW (ref 36.0–46.0)
Hemoglobin: 8.9 g/dL — ABNORMAL LOW (ref 12.0–15.0)
Hemoglobin: 8.1 g/dL — ABNORMAL LOW (ref 12.0–15.0)
MCH: 30.3 pg (ref 26.0–34.0)
MCH: 30.2 pg (ref 26.0–34.0)
MCHC: 34.9 g/dL (ref 30.0–36.0)
MCHC: 34.6 g/dL (ref 30.0–36.0)
MCV: 86.7 fL (ref 78.0–100.0)
MCV: 87.3 fL (ref 78.0–100.0)
Platelets: 201 10*3/uL (ref 150–400)
Platelets: 167 10*3/uL (ref 150–400)
RBC: 2.94 MIL/uL — ABNORMAL LOW (ref 3.87–5.11)
RBC: 2.68 MIL/uL — ABNORMAL LOW (ref 3.87–5.11)
RDW: 13.8 % (ref 11.5–15.5)
RDW: 14 % (ref 11.5–15.5)
WBC: 12.2 10*3/uL — ABNORMAL HIGH (ref 4.0–10.5)
WBC: 10.4 10*3/uL (ref 4.0–10.5)

## 2011-09-06 LAB — POCT I-STAT, CHEM 8
BUN: 13 mg/dL (ref 6–23)
BUN: 12 mg/dL (ref 6–23)
Calcium, Ion: 1.17 mmol/L (ref 1.12–1.32)
Calcium, Ion: 1.14 mmol/L (ref 1.12–1.32)
Chloride: 103 mEq/L (ref 96–112)
Chloride: 100 mEq/L (ref 96–112)
Creatinine, Ser: 1 mg/dL (ref 0.50–1.10)
Creatinine, Ser: 1.2 mg/dL — ABNORMAL HIGH (ref 0.50–1.10)
Glucose, Bld: 147 mg/dL — ABNORMAL HIGH (ref 70–99)
Glucose, Bld: 161 mg/dL — ABNORMAL HIGH (ref 70–99)
HCT: 28 % — ABNORMAL LOW (ref 36.0–46.0)
HCT: 22 % — ABNORMAL LOW (ref 36.0–46.0)
Hemoglobin: 9.5 g/dL — ABNORMAL LOW (ref 12.0–15.0)
Hemoglobin: 7.5 g/dL — ABNORMAL LOW (ref 12.0–15.0)
Potassium: 3.9 mEq/L (ref 3.5–5.1)
Potassium: 3.9 mEq/L (ref 3.5–5.1)
Sodium: 140 mEq/L (ref 135–145)
Sodium: 137 mEq/L (ref 135–145)
TCO2: 24 mmol/L (ref 0–100)
TCO2: 25 mmol/L (ref 0–100)

## 2011-09-06 LAB — GLUCOSE, CAPILLARY
Glucose-Capillary: 104 mg/dL — ABNORMAL HIGH (ref 70–99)
Glucose-Capillary: 107 mg/dL — ABNORMAL HIGH (ref 70–99)
Glucose-Capillary: 108 mg/dL — ABNORMAL HIGH (ref 70–99)
Glucose-Capillary: 111 mg/dL — ABNORMAL HIGH (ref 70–99)
Glucose-Capillary: 112 mg/dL — ABNORMAL HIGH (ref 70–99)
Glucose-Capillary: 112 mg/dL — ABNORMAL HIGH (ref 70–99)
Glucose-Capillary: 113 mg/dL — ABNORMAL HIGH (ref 70–99)
Glucose-Capillary: 114 mg/dL — ABNORMAL HIGH (ref 70–99)
Glucose-Capillary: 116 mg/dL — ABNORMAL HIGH (ref 70–99)
Glucose-Capillary: 122 mg/dL — ABNORMAL HIGH (ref 70–99)
Glucose-Capillary: 129 mg/dL — ABNORMAL HIGH (ref 70–99)
Glucose-Capillary: 140 mg/dL — ABNORMAL HIGH (ref 70–99)
Glucose-Capillary: 149 mg/dL — ABNORMAL HIGH (ref 70–99)
Glucose-Capillary: 172 mg/dL — ABNORMAL HIGH (ref 70–99)
Glucose-Capillary: 95 mg/dL (ref 70–99)

## 2011-09-06 LAB — BASIC METABOLIC PANEL
BUN: 12 mg/dL (ref 6–23)
CO2: 26 mEq/L (ref 19–32)
Calcium: 8.3 mg/dL — ABNORMAL LOW (ref 8.4–10.5)
Chloride: 105 mEq/L (ref 96–112)
Creatinine, Ser: 1.02 mg/dL (ref 0.50–1.10)
GFR calc Af Amer: 65 mL/min — ABNORMAL LOW (ref >90)
GFR calc non Af Amer: 56 mL/min — ABNORMAL LOW (ref >90)
Glucose, Bld: 112 mg/dL — ABNORMAL HIGH (ref 70–99)
Potassium: 3.9 mEq/L (ref 3.5–5.1)
Sodium: 138 mEq/L (ref 135–145)

## 2011-09-06 LAB — MAGNESIUM
Magnesium: 2.4 mg/dL (ref 1.5–2.5)
Magnesium: 2.2 mg/dL (ref 1.5–2.5)

## 2011-09-06 LAB — CREATININE, SERUM
Creatinine, Ser: 1.11 mg/dL — ABNORMAL HIGH (ref 0.50–1.10)
GFR calc Af Amer: 59 mL/min — ABNORMAL LOW (ref >90)
GFR calc non Af Amer: 51 mL/min — ABNORMAL LOW (ref >90)

## 2011-09-06 MED ORDER — INSULIN ASPART 100 UNIT/ML ~~LOC~~ SOLN
0.0000 [IU] | SUBCUTANEOUS | Status: DC
  Administered 2011-09-06: 4 [IU] via SUBCUTANEOUS
  Administered 2011-09-06 – 2011-09-07 (×4): 2 [IU] via SUBCUTANEOUS
  Filled 2011-09-06: qty 3

## 2011-09-06 MED ORDER — INSULIN GLARGINE 100 UNIT/ML ~~LOC~~ SOLN
30.0000 [IU] | SUBCUTANEOUS | Status: DC
  Administered 2011-09-06 – 2011-09-08 (×3): 30 [IU] via SUBCUTANEOUS
  Filled 2011-09-06 (×2): qty 3

## 2011-09-06 MED FILL — Potassium Chloride Inj 2 mEq/ML: INTRAVENOUS | Qty: 40 | Status: AC

## 2011-09-06 MED FILL — Magnesium Sulfate Inj 50%: INTRAMUSCULAR | Qty: 10 | Status: AC

## 2011-09-06 NOTE — Plan of Care (Signed)
Problem: Phase II Progression Outcomes Goal: Tolerates D/C of vasopressors Outcome: Progressing Weaning phenylephrine. Down to  4 mcg

## 2011-09-06 NOTE — Progress Notes (Signed)
Patient ID: [name], female   DOB: 14-Oct-1945, 66 y.o.   MRN: [id number] POD # 1 Stable day Weaning neo gtt BP 102/66  Pulse 98  Temp(Src) 98.6 F (37 C) (Oral)  Resp 23  Ht 5\' 3"  (1.6 m)  Wt 102.5 kg (225 lb 15.5 oz)  BMI 40.03 kg/m2  SpO2 100% K 3.9 CBC    Component Value Date/Time   WBC 10.4 09/06/2011 1729   WBC 6.6 09/19/2010 1152   RBC 2.68* 09/06/2011 1729   RBC 4.41 09/19/2010 1152   HGB 8.1* 09/06/2011 1729   HGB 13.0 09/19/2010 1152   HCT 23.4* 09/06/2011 1729   HCT 39.3 09/19/2010 1152   PLT 167 09/06/2011 1729   PLT 276 09/19/2010 1152   MCV 87.3 09/06/2011 1729   MCV 89.2 09/19/2010 1152   MCH 30.2 09/06/2011 1729   MCH 30.0 02/22/2010 1023   MCHC 34.6 09/06/2011 1729   MCHC 33.0 09/19/2010 1152   RDW 14.0 09/06/2011 1729   RDW 14.8* 09/19/2010 1152   LYMPHSABS 2.2 09/19/2010 1152   MONOABS 0.5 09/19/2010 1152   EOSABS 0.3 09/19/2010 1152   BASOSABS 0.0 09/19/2010 1152    Continue present care

## 2011-09-06 NOTE — Progress Notes (Signed)
Physical Therapy Evaluation Patient Details Name: [name] MRN: [id number] DOB: 1946/04/19 Today's Date: 09/06/2011  Problem List:  Patient Active Problem List  Diagnoses  . DM  . DYSLIPIDEMIA  . CORONARY ARTERY DISEASE, S/P PTCA  . LAMINECTOMY, HX OF  . Unspecified essential hypertension  . S/P CABG (coronary artery bypass graft)    Past Medical History:  Past Medical History  Diagnosis Date  . CAD (coronary artery disease)     Non-STEMI/Taxus stenting 100% left anterior descending (2), January 2008. Residual 70% circumflex;80% right coronary artery. Mild LVD (EF 45%);improved to 55-60% by 2-D echocardiogram,January 2009.   [name] Dyslipidemia     takes Pravastatin daily  . Obesity   . Lumbar herniated disc   . Gout     takes Allopurinol daily  . Unspecified essential hypertension     takes Metoprolol,Lisinopril,and Amlodipine  . Myocardial infarction 2008    pt has 2 stents  . Aortic stenosis   . AV block, 1st degree   . Sleep apnea     with exertion-occasionally  . Arthritis   . Back pain     occasionally with weather changes  . Herniated disc     left  . Urinary frequency   . Nocturia   . DM (diabetes mellitus)     takes Metformin 1000mg  BID  . Early cataracts, bilateral    Past Surgical History:  Past Surgical History  Procedure Date  . Back surgery 2009/2010/2011  . Carpal tunnel release     bilateral  . Cholecystectomy not sure  . Breast biopsy unknown    left  . Leg tendon surgery unknown    left   . Abdominal hysterectomy   . Tonsillectomy   . Cardiac catheterization 2008    NON-STEMI/TAXUS STENTING 100% PROXIMAL LAD (X2) JANUARY 2008  . Colonoscopy     PT Assessment/Plan/Recommendation PT Assessment Clinical Impression Statement: [name] is 66 y/o female POD #1 CABG. PT eval completed and pt demonstrates limited mobility as a result of surgical procedure and will benefit physical therapy in teh acute setting to maximize mobility and  independence so as to faciliate a safe d/c home.  PT Recommendation/Assessment: Patient will need skilled PT in the acute care venue PT Problem List: Decreased activity tolerance;Decreased mobility;Cardiopulmonary status limiting activity;Decreased knowledge of precautions;Obesity;Pain Barriers to Discharge: Inaccessible home environment PT Therapy Diagnosis : Difficulty walking;Abnormality of gait;Acute pain PT Plan PT Frequency: Min 3X/week PT Treatment/Interventions: Gait training;DME instruction;Stair training;Functional mobility training;Neuromuscular re-education;Balance training;Therapeutic exercise;Therapeutic activities;Patient/family education PT Recommendation Recommendations for Other Services: OT consult Follow Up Recommendations: Home health PT Equipment Recommended: None recommended by PT PT Goals  Acute Rehab PT Goals PT Goal Formulation: With patient Pt will Roll Supine to Right Side: with modified independence PT Goal: Rolling Supine to Right Side - Progress: Progressing toward goal Pt will Roll Supine to Left Side: with modified independence PT Goal: Rolling Supine to Left Side - Progress: Progressing toward goal Pt will go Supine/Side to Sit: with modified independence PT Goal: Supine/Side to Sit - Progress: Progressing toward goal Pt will go Sit to Supine/Side: with modified independence PT Goal: Sit to Supine/Side - Progress: Progressing toward goal Pt will go Sit to Stand: with modified independence PT Goal: Sit to Stand - Progress: Progressing toward goal Pt will go Stand to Sit: with modified independence PT Goal: Stand to Sit - Progress: Progressing toward goal Pt will Transfer Bed to Chair/Chair to Bed: with modified independence PT Transfer Goal: Bed to  Chair/Chair to Bed - Progress: Progressing toward goal Pt will Ambulate: >150 feet;with modified independence;with least restrictive assistive device PT Goal: Ambulate - Progress: Progressing toward goal Pt  will Go Up / Down Stairs: 3-5 stairs;with min assist;with least restrictive assistive device PT Goal: Up/Down Stairs - Progress: Progressing toward goal  PT Evaluation Precautions/Restrictions  Precautions Precautions: Sternal Prior Functioning  Home Living Lives With: Son (lives with son but will go to daughters house when she d/c's) Receives Help From: Family Type of Home: House Home Layout: Two level Alternate Level Stairs-Number of Steps: pt unable to fully tell me house set up of her daughters home Home Access: Stairs to enter Bathroom Shower/Tub: Engineer, manufacturing systems: Standard Home Adaptive Equipment: Environmental consultant - rolling Prior Function Level of Independence: Independent with basic ADLs;Independent with homemaking with ambulation;Independent with gait;Independent with transfers Cognition Cognition Arousal/Alertness: Awake/alert Overall Cognitive Status: Appears within functional limits for tasks assessed Orientation Level: Oriented X4 Sensation/Coordination Sensation Light Touch: Appears Intact Coordination Gross Motor Movements are Fluid and Coordinated: Yes Fine Motor Movements are Fluid and Coordinated: Yes Extremity Assessment RUE Assessment RUE Assessment: Within Functional Limits LUE Assessment LUE Assessment: Within Functional Limits RLE Assessment RLE Assessment: Within Functional Limits LLE Assessment LLE Assessment: Within Functional Limits Mobility (including Balance) Bed Mobility Bed Mobility: Yes Rolling Left: 1: +2 Total assist (70%) Rolling Left Details (indicate cue type and reason): cues for sequencing and follow through Left Sidelying to Sit: 1: +2 Total assist Left Sidelying to Sit Details (indicate cue type and reason): +2totalpt70%; assist to initiate but pt with good follow through to upright sitting Transfers Transfers: Yes Sit to Stand: 1: +2 Total assist;From bed Sit to Stand Details (indicate cue type and reason): +2totalpt70%;  assist for safe technique with sternal precautions and follw through to stand; once in standing pt using bilateral HHA to steady self Stand to Sit: 4: Min assist;To chair/3-in-1 Ambulation/Gait Ambulation/Gait: Yes Ambulation/Gait Assistance: 1: +2 Total assist Ambulation/Gait Assistance Details (indicate cue type and reason): +2totalpt80% to take 6-8 pivotal steps to chair; bilateral HHA Assistive device: 2 person hand held assist  Balance Balance Assessed: Yes Static Sitting Balance Static Sitting - Balance Support: Bilateral upper extremity supported Static Sitting - Level of Assistance: 6: Modified independent (Device/Increase time) Exercise  Total Joint Exercises Ankle Circles/Pumps: AROM;Both;5 reps;Supine End of Session PT - End of Session Equipment Utilized During Treatment: Gait belt Activity Tolerance: Patient tolerated treatment well;Patient limited by pain Patient left: in chair;with call bell in reach Nurse Communication: Mobility status for transfers;Mobility status for ambulation General Behavior During Session: [facility] for tasks performed Cognition: [facility] for tasks performed  [facility] HELEN 09/06/2011, 12:45 PM

## 2011-09-06 NOTE — Progress Notes (Addendum)
1 Day Post-Op Procedure(s) (LRB): CORONARY ARTERY BYPASS GRAFTING (CABG) (N/A)  Subjective: Patient a little groggy but is oriented.   Objective: Vital signs in last 24 hours: Patient Vitals for the past 24 hrs:  BP Temp Temp src Pulse Resp SpO2 Weight  09/06/11 0700 104/54 mmHg 99.9 F (37.7 C) - 99  20  100 % -  09/06/11 0600 109/59 mmHg 99.9 F (37.7 C) - 99  23  99 % -  09/06/11 0500 99/50 mmHg 100 F (37.8 C) - 99  15  100 % 225 lb 15.5 oz (102.5 kg)  09/06/11 0400 96/45 mmHg 100.2 F (37.9 C) - 100  1  100 % -  09/06/11 0300 90/59 mmHg 100.2 F (37.9 C) - 100  16  100 % -  09/06/11 0200 91/56 mmHg 100.2 F (37.9 C) - 101  13  100 % -  09/06/11 0100 99/55 mmHg 100.2 F (37.9 C) - 102  6  100 % -  09/06/11 0000 91/55 mmHg 100.2 F (37.9 C) - 102  18  100 % -  09/05/11 2300 91/56 mmHg 100 F (37.8 C) - 100  5  100 % -  09/05/11 2259 - 100 F (37.8 C) - 100  12  100 % -   Pre op weight  100 kg Current Weight  09/06/11 225 lb 15.5 oz (102.5 kg)    Hemodynamic parameters for last 24 hours: PAP: (26-38)/(13-23) 31/18 mmHg CO:  [3.1 L/min-4.7 L/min] 4.7 L/min CI:  [1.6 L/min/m2-2.3 L/min/m2] 2.3 L/min/m2  Intake/Output from previous day: 01/09 0701 - 01/10 0700 In: 6309.6 [I.V.:4342.6; Blood:735; NG/GT:30; IV Piggyback:1202] Out: 4225 [Urine:2570; Blood:1350; Chest Tube:305]   Physical Exam:  Cardiovascular: RRR;rub present with CTs in place. No murmurs, gallops Pulmonary: Diminished at bases bilaterally; no rales, wheezes, or rhonchi. Abdomen: Soft, non tender, occasional bowel sounds present. Extremities: Bilateral lower extremity edema (L>R) Wounds: Dressings clean and dry.    Lab Results: CBC: Basename 09/06/11 0417 09/05/11 2030  WBC 12.2* 14.1*  HGB 8.9* 9.6*  HCT 25.5* 27.5*  PLT 201 211   BMET:  Basename 09/06/11 0417 09/05/11 2030 09/05/11 1501 09/03/11 1211  NA 138 -- 138 --  K 3.9 -- 3.5 --  CL 105 -- -- 101  CO2 26 -- -- 25  GLUCOSE 112*  -- 149* --  BUN 12 -- -- 16  CREATININE 1.02 0.96 -- --  CALCIUM 8.3* -- -- 9.8    PT/INR:  Basename 09/05/11 1450  LABPROT 15.5*  INR 1.20   ABG:  INR: Will add last result for INR, ABG once components are confirmed Will add last 4 CBG results once components are confirmed  Assessment/Plan:  1. CV - SR. .Dopamine stopped last evening.Wean Phenylephrine (at 18 mcgmin).Continue Lopressor 12.5 bid.EKG this am show SR. 2.  Pulmonary - Encourage incentive spirometer.CXR this am shows no ptx, low lung volumes, bibasilar atx, bilateral pleural effusions (L>R). Chest tubes with 305 cc last 24 hours. No air leak. 3. Volume Overload - Likely begin gentle diuresis. 4.  Acute blood loss anemia - H/H 8.9/25.5.Monitor. 5.DM-CBGs 111/108/122. Will restart Metformin when tolerating po well.Continue Insulin for now. 6.Remove Aline, Swan, likely chest tubes later today.  ZIMMERMAN,DONIELLE MPA-C 09/06/2011   I have seen and examined the patient and agree with the above assessment

## 2011-09-07 ENCOUNTER — Inpatient Hospital Stay (HOSPITAL_COMMUNITY): Payer: Medicare Other

## 2011-09-07 DIAGNOSIS — Z951 Presence of aortocoronary bypass graft: Secondary | ICD-10-CM | POA: Diagnosis not present

## 2011-09-07 LAB — CBC
HCT: 21.5 % — ABNORMAL LOW (ref 36.0–46.0)
Hemoglobin: 7.5 g/dL — ABNORMAL LOW (ref 12.0–15.0)
MCH: 30.9 pg (ref 26.0–34.0)
MCHC: 34.9 g/dL (ref 30.0–36.0)
MCV: 88.5 fL (ref 78.0–100.0)
Platelets: 151 10*3/uL (ref 150–400)
RBC: 2.43 MIL/uL — ABNORMAL LOW (ref 3.87–5.11)
RDW: 14.1 % (ref 11.5–15.5)
WBC: 9.6 10*3/uL (ref 4.0–10.5)

## 2011-09-07 LAB — BASIC METABOLIC PANEL
BUN: 15 mg/dL (ref 6–23)
CO2: 28 mEq/L (ref 19–32)
Calcium: 8 mg/dL — ABNORMAL LOW (ref 8.4–10.5)
Chloride: 103 mEq/L (ref 96–112)
Creatinine, Ser: 1.16 mg/dL — ABNORMAL HIGH (ref 0.50–1.10)
GFR calc Af Amer: 56 mL/min — ABNORMAL LOW (ref >90)
GFR calc non Af Amer: 48 mL/min — ABNORMAL LOW (ref >90)
Glucose, Bld: 151 mg/dL — ABNORMAL HIGH (ref 70–99)
Potassium: 4 mEq/L (ref 3.5–5.1)
Sodium: 137 mEq/L (ref 135–145)

## 2011-09-07 LAB — GLUCOSE, CAPILLARY
Glucose-Capillary: 159 mg/dL — ABNORMAL HIGH (ref 70–99)
Glucose-Capillary: 160 mg/dL — ABNORMAL HIGH (ref 70–99)
Glucose-Capillary: 173 mg/dL — ABNORMAL HIGH (ref 70–99)
Glucose-Capillary: 136 mg/dL — ABNORMAL HIGH (ref 70–99)
Glucose-Capillary: 153 mg/dL — ABNORMAL HIGH (ref 70–99)

## 2011-09-07 MED ORDER — MOVING RIGHT ALONG BOOK
Freq: Once | Status: AC
  Administered 2011-09-07: 17:00:00
  Filled 2011-09-07: qty 1

## 2011-09-07 MED ORDER — ZOLPIDEM TARTRATE 5 MG PO TABS: 5.0000 mg | ORAL_TABLET | Freq: Every evening | ORAL | Status: DC | PRN

## 2011-09-07 MED ORDER — FUROSEMIDE 10 MG/ML IJ SOLN
40.0000 mg | Freq: Once | INTRAMUSCULAR | Status: AC
  Administered 2011-09-07: 40 mg via INTRAVENOUS
  Filled 2011-09-07: qty 4

## 2011-09-07 MED ORDER — TRAMADOL HCL 50 MG PO TABS: 50.0000 mg | ORAL_TABLET | ORAL | Status: DC | PRN

## 2011-09-07 MED ORDER — INSULIN ASPART 100 UNIT/ML ~~LOC~~ SOLN
0.0000 [IU] | Freq: Three times a day (TID) | SUBCUTANEOUS | Status: DC
  Administered 2011-09-07: 2 [IU] via SUBCUTANEOUS
  Administered 2011-09-07: 4 [IU] via SUBCUTANEOUS
  Administered 2011-09-07 – 2011-09-11 (×7): 2 [IU] via SUBCUTANEOUS
  Filled 2011-09-07: qty 3

## 2011-09-07 MED ORDER — SODIUM CHLORIDE 0.9 % IV SOLN: 250.0000 mL | INTRAVENOUS | Status: DC | PRN

## 2011-09-07 MED ORDER — POTASSIUM CHLORIDE CRYS ER 20 MEQ PO TBCR
40.0000 meq | EXTENDED_RELEASE_TABLET | Freq: Once | ORAL | Status: AC
  Administered 2011-09-07: 40 meq via ORAL
  Filled 2011-09-07: qty 2

## 2011-09-07 MED ORDER — SODIUM CHLORIDE 0.9 % IJ SOLN
3.0000 mL | Freq: Two times a day (BID) | INTRAMUSCULAR | Status: DC
  Administered 2011-09-07 – 2011-09-11 (×7): 3 mL via INTRAVENOUS

## 2011-09-07 MED ORDER — FUROSEMIDE 40 MG PO TABS
40.0000 mg | ORAL_TABLET | Freq: Every day | ORAL | Status: DC
  Administered 2011-09-08 – 2011-09-11 (×4): 40 mg via ORAL
  Filled 2011-09-07 (×4): qty 1

## 2011-09-07 MED ORDER — SODIUM CHLORIDE 0.9 % IJ SOLN
3.0000 mL | INTRAMUSCULAR | Status: DC | PRN
  Administered 2011-09-10: 3 mL via INTRAVENOUS

## 2011-09-07 MED ORDER — MAGNESIUM HYDROXIDE 400 MG/5ML PO SUSP: 30.0000 mL | Freq: Every day | ORAL | Status: DC | PRN

## 2011-09-07 MED ORDER — POVIDONE-IODINE 10 % EX SOLN
1.0000 "application " | Freq: Two times a day (BID) | CUTANEOUS | Status: DC
  Administered 2011-09-07 – 2011-09-11 (×8): 1 via TOPICAL
  Filled 2011-09-07: qty 15

## 2011-09-07 MED FILL — Electrolyte-R (PH 7.4) Solution: INTRAVENOUS | Qty: 1000 | Status: AC

## 2011-09-07 MED FILL — Sodium Chloride IV Soln 0.9%: INTRAVENOUS | Qty: 1000 | Status: AC

## 2011-09-07 MED FILL — Heparin Sodium (Porcine) Inj 1000 Unit/ML: INTRAMUSCULAR | Qty: 40 | Status: AC

## 2011-09-07 MED FILL — Sodium Chloride Irrigation Soln 0.9%: Qty: 3000 | Status: AC

## 2011-09-07 NOTE — Progress Notes (Signed)
CARDIAC REHAB PHASE I   PRE:  Rate/Rhythm: 118  ST  BP:  Supine:   Sitting: 102/60  Standing:    SaO2: 85%RA 2L [place] applied O2 SAT 92%  MODE:  Ambulation: 380 ft   POST:  Rate/Rhythem: 137  BP:  Supine:   Sitting: 103/69  Standing:    SaO2: 96% 2L  O2 SAT 85% on room air, 2L [place] placed 02 SAT 92%. Pt ambulated with RW and 2 assist tolerated well. Back to chair with feet up call bell in reach.   Rosalie Doctor

## 2011-09-07 NOTE — Op Note (Signed)
[name], [name]               ACCOUNT NO.:  [id number]  MEDICAL RECORD NO.:  [id number]  LOCATION:  2303                         FACILITY:  [facility]  PHYSICIAN:  Sheliah Plane, MD    DATE OF BIRTH:  07/14/1946  DATE OF PROCEDURE:  09/05/2011 DATE OF DISCHARGE:                              OPERATIVE REPORT   PREOPERATIVE DIAGNOSIS:  Coronary occlusive disease with new onset of angina.  POSTOPERATIVE DIAGNOSIS:  Coronary occlusive disease with new onset of angina.  SURGICAL PROCEDURE:  Coronary artery bypass grafting x3 with left internal mammary to the left anterior descending coronary artery, reverse saphenous vein graft to the obtuse marginal coronary artery, reverse saphenous vein graft to the acute marginal with left leg and calf and thigh endo-vein harvesting.  SURGEON:  Sheliah Plane, MD.  FIRST ASSISTANT:  Cecile Sheerer SA.  BRIEF HISTORY:  The patient is a 67 year old diabetic female who previously had an acute myocardial infarction involving the anterior wall, with placement of multiple stents in the LAD in 2008.  Recently, she returned to see Dr. Kirke Corin with increasing episodes of chest discomfort and underwent repeat evaluation including cardiac catheterization, which demonstrated total occlusion of LAD with collateral filling from a jeopardized right coronary artery with a proximal 80% stenosis and a 80-85% stenosis of a large obtuse marginal coronary artery.  Because of the patient's advanced disease in both the right and obtuse marginal, which were supplying collaterals to the totally occluded LAD, coronary artery bypass grafting was recommended to the patient.  I did cath preoperatively and echocardiogram was obtained that showed no evidence of aortic stenosis.  The risks and options were discussed with the patient and her daughter in detail, and she was willing to proceed.  DESCRIPTION OF PROCEDURE:  With Swan-Ganz and arterial line monitors in place,  the patient underwent general endotracheal anesthesia without incident.  Skin of chest and legs was prepped with Betadine and draped in usual sterile manner.  Using the [employer]ant endo-vein harvesting system, a vein was harvested from the left thigh and calf.  It was of excellent quality and caliber.  Initially, an incision had been made at the right knee; however, no suitable vein could be located there and this incision was closed.  Median sternotomy was performed.  Left internal mammary artery was dissected down as a pedicle graft.  Distal artery was divided and had excellent free flow.  Pericardium was opened.  Overall ventricular function appeared preserved, though there was some evidence of anterior apical scarring of the ventricle from previous occlusion of the LAD.  After systemic heparinization, the patient was placed on cardiopulmonary bypass 2.4 L/minute/meter squared.  Sites of anastomosis were selected and dissected out of the epicardium.  The patient's body temperature was cooled to 32 degrees.  Aortic crossclamp was applied and 500 mL of cold blood potassium cardioplegia was administered with diastolic arrest of the heart.  Myocardial septal temperatures monitored throughout crossclamp.  Attention was turned first to the acute marginal.  The patient's right coronary artery proper was very small with a large vessel arising at the acute margin and then supplying the inferior wall of the heart.  This vessel was opened and  admitted a 1.5 mm probe distally.  Using a running 7-0 Prolene, distal anastomosis was performed.  The heart was then elevated and a large obtuse marginal vessel was opened and admitted 1.5 mm probe.  Using a running 7-0 Prolene, distal anastomosis was performed.  The very distal circumflex branches proper as they arose from the AV groove were less than 1 mm in size and not bypassable.  Attention was then turned to the left anterior descending coronary artery,  which was a diffusely diseased vessel.  In the distal third of the vessel, the LAD was opened and admitted 1.5 mm probe distally.  Using a running 8-0 Prolene, the left internal mammary artery was anastomosed to left anterior descending coronary artery. With release of the bulldog on the mammary artery, there was rise in myocardial septal temperature.  The bulldog was placed back on the mammary artery.  Additional cold blood cardioplegia was administered. With crossclamp still in place, 2 punch aortotomies were performed and each of the 2 vein grafts were anastomosed to the ascending aorta.  Air was evacuated from the grafts and the ascending aorta.  Aortic cross- clamp was removed.  Crossclamp time was 65 minutes.  The patient did require electrical defibrillation to turn to a sinus rhythm, but because of a slow rate, she was paced with the body temperature rewarmed to 37 degrees.  She was then ventilated and weaned from cardiopulmonary bypass without difficulty.  She remained hemodynamically stable, was decannulated in usual fashion.  Protamine sulfate was administered with the operative field hemostatic.  A left pleural tube and a Blake mediastinal drain were left in place.  Sternum was closed with #6 stainless steel wire.  Fascia was closed with interrupted 0 Vicryl, running 3-0 Vicryl in  subcutaneous tissue, 4-0 subcuticular stitch in skin edges.  Dry dressings were applied.  Sponge and needle count was reported as correct at completion of the procedure.  The patient did require blood bank 1 unit of packed cells because of low hematocrit while on cardiopulmonary bypass.  Total pump time was 88 minutes.     Sheliah Plane, MD     EG/MEDQ  D:  09/06/2011  T:  09/06/2011  Job:  [id number]  cc:   Lorine Bears, MD

## 2011-09-07 NOTE — Progress Notes (Signed)
Transferred to 2031 via wheelchair.  Portable monitor on.  No changes. 

## 2011-09-07 NOTE — Progress Notes (Signed)
2 Days Post-Op Procedure(s) (LRB): CORONARY ARTERY BYPASS GRAFTING (CABG) (N/A) Subjective: Feels better today Denies pain, nausea  Objective: Vital signs in last 24 hours: Temp:  [97.8 F (36.6 C)-99.7 F (37.6 C)] 99.2 F (37.3 C) (01/11 0730) Pulse Rate:  [93-104] 100  (01/11 0700) Cardiac Rhythm:  [-] Heart block (01/11 0700) Resp:  [0-23] 20  (01/11 0700) BP: (92-107)/(49-66) 93/49 mmHg (01/11 0700) SpO2:  [96 %-100 %] 96 % (01/11 0700) Weight:  [105.4 kg (232 lb 5.8 oz)] 105.4 kg (232 lb 5.8 oz) (01/11 0600)  Hemodynamic parameters for last 24 hours: PAP: (27-34)/(14-19) 34/19 mmHg CO:  [4.5 L/min] 4.5 L/min CI:  [2.3 L/min/m2] 2.3 L/min/m2  Intake/Output from previous day: 01/10 0701 - 01/11 0700 In: 1334.3 [P.O.:540; I.V.:694.3; IV Piggyback:100] Out: 1780 [Urine:1650; Chest Tube:130] Intake/Output this shift:    General appearance: alert and no distress Neurologic: intact Heart: regular rate and rhythm Lungs: diminished breath sounds bibasilar Abdomen: normal findings: soft, non-tender  Lab Results:  Basename 09/07/11 0605 09/06/11 1729  WBC 9.6 10.4  HGB 7.5* 8.1*  HCT 21.5* 23.4*  PLT 151 167   BMET:  Basename 09/07/11 0605 09/06/11 1729 09/06/11 1728 09/06/11 0417  NA 137 -- 137 --  K 4.0 -- 3.9 --  CL 103 -- 100 --  CO2 28 -- -- 26  GLUCOSE 151* -- 161* --  BUN 15 -- 12 --  CREATININE 1.16* 1.11* -- --  CALCIUM 8.0* -- -- 8.3*    PT/INR:  Basename 09/05/11 1450  LABPROT 15.5*  INR 1.20   ABG    Component Value Date/Time   PHART 7.378 09/05/2011 1858   HCO3 20.0 09/05/2011 1858   TCO2 25 09/06/2011 1728   ACIDBASEDEF 5.0* 09/05/2011 1858   O2SAT 98.0 09/05/2011 1858   CBG (last 3)   Basename 09/07/11 0354 09/06/11 2336 09/06/11 1924  GLUCAP 159* 129* 149*    Assessment/Plan: S/P Procedure(s) (LRB): CORONARY ARTERY BYPASS GRAFTING (CABG) (N/A) Mobilize Diuresis Diabetes control d/c tubes/lines Plan for transfer to step-down: see  transfer orders Anemia with low BP- transfuse Lasix after transfusion    LOS: 2 days    [name] C 09/07/2011

## 2011-09-08 LAB — GLUCOSE, CAPILLARY
Glucose-Capillary: 122 mg/dL — ABNORMAL HIGH (ref 70–99)
Glucose-Capillary: 122 mg/dL — ABNORMAL HIGH (ref 70–99)
Glucose-Capillary: 154 mg/dL — ABNORMAL HIGH (ref 70–99)
Glucose-Capillary: 108 mg/dL — ABNORMAL HIGH (ref 70–99)

## 2011-09-08 LAB — BASIC METABOLIC PANEL
BUN: 16 mg/dL (ref 6–23)
CO2: 31 mEq/L (ref 19–32)
Calcium: 8.3 mg/dL — ABNORMAL LOW (ref 8.4–10.5)
Chloride: 103 mEq/L (ref 96–112)
Creatinine, Ser: 1.09 mg/dL (ref 0.50–1.10)
GFR calc Af Amer: 60 mL/min — ABNORMAL LOW (ref >90)
GFR calc non Af Amer: 52 mL/min — ABNORMAL LOW (ref >90)
Glucose, Bld: 128 mg/dL — ABNORMAL HIGH (ref 70–99)
Potassium: 3.8 mEq/L (ref 3.5–5.1)
Sodium: 141 mEq/L (ref 135–145)

## 2011-09-08 LAB — CBC
HCT: 26.4 % — ABNORMAL LOW (ref 36.0–46.0)
Hemoglobin: 8.8 g/dL — ABNORMAL LOW (ref 12.0–15.0)
MCH: 30 pg (ref 26.0–34.0)
MCHC: 33.3 g/dL (ref 30.0–36.0)
MCV: 90.1 fL (ref 78.0–100.0)
Platelets: 173 10*3/uL (ref 150–400)
RBC: 2.93 MIL/uL — ABNORMAL LOW (ref 3.87–5.11)
RDW: 14.1 % (ref 11.5–15.5)
WBC: 8.4 10*3/uL (ref 4.0–10.5)

## 2011-09-08 MED ORDER — METFORMIN HCL 500 MG PO TABS
500.0000 mg | ORAL_TABLET | Freq: Two times a day (BID) | ORAL | Status: DC
  Administered 2011-09-08 – 2011-09-11 (×8): 500 mg via ORAL
  Filled 2011-09-08 (×9): qty 1

## 2011-09-08 MED ORDER — POLYSACCHARIDE IRON 150 MG PO CAPS
150.0000 mg | ORAL_CAPSULE | Freq: Every day | ORAL | Status: DC
  Administered 2011-09-08 – 2011-09-11 (×4): 150 mg via ORAL
  Filled 2011-09-08 (×4): qty 1

## 2011-09-08 MED ORDER — FOLIC ACID 1 MG PO TABS
1.0000 mg | ORAL_TABLET | Freq: Every day | ORAL | Status: DC
  Administered 2011-09-08 – 2011-09-11 (×4): 1 mg via ORAL
  Filled 2011-09-08 (×4): qty 1

## 2011-09-08 MED ORDER — FUROSEMIDE 40 MG PO TABS: 40.0000 mg | ORAL_TABLET | Freq: Every day | ORAL | Status: DC

## 2011-09-08 MED ORDER — METOPROLOL TARTRATE 25 MG PO TABS: 12.5000 mg | ORAL_TABLET | Freq: Two times a day (BID) | ORAL | Status: DC

## 2011-09-08 MED ORDER — ASPIRIN 325 MG PO TBEC: 325.0000 mg | DELAYED_RELEASE_TABLET | Freq: Every day | ORAL | Status: AC

## 2011-09-08 MED ORDER — POTASSIUM CHLORIDE ER 10 MEQ PO TBCR: 20.0000 meq | EXTENDED_RELEASE_TABLET | Freq: Every day | ORAL | Status: DC

## 2011-09-08 MED ORDER — POTASSIUM CHLORIDE CRYS ER 20 MEQ PO TBCR
EXTENDED_RELEASE_TABLET | ORAL | Status: AC
  Filled 2011-09-08: qty 1

## 2011-09-08 MED ORDER — POTASSIUM CHLORIDE CRYS ER 20 MEQ PO TBCR: 20.0000 meq | EXTENDED_RELEASE_TABLET | Freq: Every day | ORAL | Status: DC

## 2011-09-08 MED ORDER — POTASSIUM CHLORIDE CRYS ER 20 MEQ PO TBCR
20.0000 meq | EXTENDED_RELEASE_TABLET | Freq: Once | ORAL | Status: AC
  Administered 2011-09-08: 20 meq via ORAL

## 2011-09-08 MED ORDER — POTASSIUM CHLORIDE CRYS ER 20 MEQ PO TBCR
20.0000 meq | EXTENDED_RELEASE_TABLET | Freq: Every day | ORAL | Status: DC
  Administered 2011-09-09 – 2011-09-11 (×3): 20 meq via ORAL
  Filled 2011-09-08 (×3): qty 1

## 2011-09-08 MED ORDER — FOLIC ACID 1 MG PO TABS: 1.0000 mg | ORAL_TABLET | Freq: Every day | ORAL | Status: DC

## 2011-09-08 MED ORDER — POLYSACCHARIDE IRON 150 MG PO CAPS: 150.0000 mg | ORAL_CAPSULE | Freq: Every day | ORAL | Status: DC

## 2011-09-08 MED ORDER — OXYCODONE HCL 5 MG PO TABS: 5.0000 mg | ORAL_TABLET | ORAL | Status: AC | PRN

## 2011-09-08 NOTE — Progress Notes (Addendum)
3 Days Post-Op Procedure(s) (LRB): CORONARY ARTERY BYPASS GRAFTING (CABG) (N/A)  Subjective: Patient with a bowel movement. No real complaints.  Objective: Vital signs in last 24 hours: Patient Vitals for the past 24 hrs:  BP Temp Temp src Pulse Resp SpO2 Weight  09/08/11 0548 - - - - - - 228 lb 1.6 oz (103.465 kg)  09/08/11 0449 109/69 mmHg 98.2 F (36.8 C) Oral 104  17  95 % -  09/07/11 2033 90/55 mmHg 99.1 F (37.3 C) Oral 110  20  96 % -  09/07/11 1213 118/76 mmHg - - - - - -  09/07/11 1115 - 98.9 F (37.2 C) Oral 104  20  95 % -  09/07/11 1015 105/51 mmHg 98.2 F (36.8 C) Oral 102  19  - -  09/07/11 1000 106/49 mmHg 98.2 F (36.8 C) Oral 107  23  94 % -  09/07/11 0945 - - - 107  24  - -  09/07/11 0930 - - - 104  19  - -   Pre op weight 100 kg Current Weight  09/08/11 228 lb 1.6 oz (103.465 kg)      Intake/Output from previous day: 01/11 0701 - 01/12 0700 In: 634 [P.O.:240; I.V.:40; Blood:350; IV Piggyback:4] Out: 2776 [Urine:2775; Stool:1]   Physical Exam:  Cardiovascular: Slightly tachycardic, no murmurs, gallops, or rubs. Pulmonary: Slightly decreased at bases bilaterally; no rales, wheezes, or rhonchi. Abdomen: Soft, non tender, bowel sounds present. Extremities: Mild bilateral lower extremity edema. Wounds: Clean and dry.  No erythema or signs of infection.  Lab Results: CBC: Basename 09/08/11 0545 09/07/11 0605  WBC 8.4 9.6  HGB 8.8* 7.5*  HCT 26.4* 21.5*  PLT 173 151   BMET:  Basename 09/08/11 0545 09/07/11 0605  NA 141 137  K 3.8 4.0  CL 103 103  CO2 31 28  GLUCOSE 128* 151*  BUN 16 15  CREATININE 1.09 1.16*  CALCIUM 8.3* 8.0*    PT/INR:  Basename 09/05/11 1450  LABPROT 15.5*  INR 1.20   ABG:  INR: Will add last result for INR, ABG once components are confirmed Will add last 4 CBG results once components are confirmed  Assessment/Plan:  1. CV - SR. Continue Lopressor 12.5 bid. Would like to increase secondary to increased HR but  sbp in the low 100's.Monitor. 2.  Pulmonary - Encourage incentive spirometer. 3. Volume Overload - Continue with diuresis. 4.  Acute blood loss anemia - H/H increased to 8.8/26.4 after transfusion yesterday.Will start Nu Iron and folic acid. 5.Supplement potassium. 6.DM-CBGs 136/153/122.Stop scheduled insulin and restart metformin. HGA1C pre op 7.1. Will need to follow up with medical doctor upon discharge.  ZIMMERMAN,DONIELLE MPA-C 09/08/2011   patient examined and medical record reviewed,agree with above note. VAN TRIGT III,Sabrea Sankey 09/08/2011

## 2011-09-08 NOTE — Progress Notes (Signed)
CARDIAC REHAB PHASE I   PRE:  Rate/Rhythm: 106 ST  BP:  Supine:   Sitting: 108/68  Standing:    SaO2: 95 RA  MODE:  Ambulation: 125 ft   POST:  Rate/Rhythem: 136 ST  BP:  Supine:   Sitting: 108/70  Standing:    SaO2: 93 RA  [name]  Pt ambulated 200 ft assist x 2 with rolling walker. Pt did not use supplemental O2 during ambulation but we carried an O2 tank during ambulation and continuously monitored pt's SAO2 on RA. SAO2 remained between 92-95% on RA. Pt tolerated ambulation well, no complaints. Stable. Returned to bedside. VSS. Pt immediately had to use toilet after vital signs measurement. Pt assisted on to toilet and assisted back to bedside. Call bell in reach. Will follow up.  Harriett Sine MS

## 2011-09-08 NOTE — Plan of Care (Signed)
Problem: Phase III Progression Outcomes Goal: Time patient transferred to PCTU/Telemetry POD Outcome: Completed/Met Date Met:  09/08/11 Patient arrived from unit 2300 at 11:34

## 2011-09-09 LAB — GLUCOSE, CAPILLARY
Glucose-Capillary: 101 mg/dL — ABNORMAL HIGH (ref 70–99)
Glucose-Capillary: 135 mg/dL — ABNORMAL HIGH (ref 70–99)
Glucose-Capillary: 110 mg/dL — ABNORMAL HIGH (ref 70–99)
Glucose-Capillary: 128 mg/dL — ABNORMAL HIGH (ref 70–99)

## 2011-09-09 MED ORDER — LEVALBUTEROL HCL 0.63 MG/3ML IN NEBU
0.6300 mg | INHALATION_SOLUTION | Freq: Three times a day (TID) | RESPIRATORY_TRACT | Status: DC | PRN
  Filled 2011-09-09: qty 3

## 2011-09-09 NOTE — Progress Notes (Signed)
Patient ambulated 300 ft. Pt was tired on arrival to room. RN encouraged pt to try again later on in day.

## 2011-09-09 NOTE — Progress Notes (Addendum)
4 Days Post-Op Procedure(s) (LRB): CORONARY ARTERY BYPASS GRAFTING (CABG) (N/A)  Subjective:   Objective: Vital signs in last 24 hours: Patient Vitals for the past 24 hrs:  BP Temp Temp src Pulse Resp SpO2 Height Weight  09/09/11 0429 109/70 mmHg 98 F (36.7 C) Oral 105  18  95 % - 222 lb 14.4 oz (101.107 kg)  09/08/11 2024 125/78 mmHg 98 F (36.7 C) Oral 119  19  92 % - -  09/08/11 1704 - - - - - - 5\' 3"  (1.6 m) -  09/08/11 1450 100/68 mmHg 99 F (37.2 C) Oral 112  20  92 % - -   Pre op weight 100 kg Current Weight  09/09/11 222 lb 14.4 oz (101.107 kg)      Intake/Output from previous day: 01/12 0701 - 01/13 0700 In: 480 [P.O.:480] Out: 1452 [Urine:1450; Stool:2]   Physical Exam:  Cardiovascular: Slightly tachycardic, no murmurs, gallops, or rubs. Pulmonary: Slightly decreased at bases bilaterally; no rales, wheezes, or rhonchi. Abdomen: Soft, non tender, bowel sounds present. Extremities: Mild bilateral lower extremity edema. Wounds: Clean and dry.  No erythema or signs of infection.  Lab Results: CBC:  Basename 09/08/11 0545 09/07/11 0605  WBC 8.4 9.6  HGB 8.8* 7.5*  HCT 26.4* 21.5*  PLT 173 151   BMET:   Basename 09/08/11 0545 09/07/11 0605  NA 141 137  K 3.8 4.0  CL 103 103  CO2 31 28  GLUCOSE 128* 151*  BUN 16 15  CREATININE 1.09 1.16*  CALCIUM 8.3* 8.0*    PT/INR: No results found for this basename: LABPROT,INR in the last 72 hours ABG:  INR: Will add last result for INR, ABG once components are confirmed Will add last 4 CBG results once components are confirmed  Assessment/Plan:  1. CV - SR. Had a missed beat around 6 am.Continue Lopressor 12.5 bid. Would like to increase secondary to increased HR but sbp in the low 100's.Monitor.  2.  Pulmonary - Encourage incentive spirometer. 3. Volume Overload - Continue with diuresis. 4.  Acute blood loss anemia - H/H increased to 8.8/26.4 after transfusion yesterday.Continue Nu Iron and folic  acid. 5.DM-CBGs 154/108/101.Continue  metformin. HGA1C pre op 7.1. Will need to follow up with medical doctor upon discharge. 6.Xopenex PRN wheezing. 7.Possibly discharge in 1-2 days.  ZIMMERMAN,DONIELLE MPA-C 09/09/2011    ZIMMERMAN,DONIELLE M 09/09/2011   patient examined and medical record reviewed,agree with above note. VAN TRIGT III,PETER 09/09/2011

## 2011-09-09 NOTE — Progress Notes (Addendum)
Physician Discharge Summary  Patient ID: EMMALEE SOLIVAN MRN: [id number] DOB/AGE: 67-08-1945 67 y.o.  Admit date: 09/05/2011 Discharge date: 09/11/2011  Admission Diagnoses: 1.History of CAD (Non-STEMI/Taxus stenting 100% left anterior descending (2), January 2008. Residual 70% circumflex;80% right coronary artery. Mild LVD (EF 45%);improved to 55-60% by 2-D echocardiogram,January 2009.) 2.History of first degree heart block 3.History of hypertension 4.History of dyslipidemia 5.History of obesity 6.History of DM 7.History of gout 8.History of lumbar herniated disc   Discharge Diagnoses:  1.History of CAD (Non-STEMI/Taxus stenting 100% left anterior descending (2), January 2008. Residual 70% circumflex;80% right coronary artery. Mild LVD (EF 45%);improved to 55-60% by 2-D echocardiogram,January 2009.) 2.History of first degree heart block 3.History of hypertension 4.History of dyslipidemia 5.History of obesity 6.History of DM 7.History of gout 8.History of lumbar herniated disc 9.Acute blood loss anemia  Procedure (s):  1.Coronary artery bypass grafting x3  (left  internal mammary to the left anterior descending coronary artery,  reverse saphenous vein graft to the obtuse marginal coronary artery,  reverse saphenous vein graft to the acute marginal) with left leg and  calf and thigh endo-vein harvesting by Dr. Tyrone Sage on 09/05/2011.  History of Presenting Illness: This is a 66 year old [place] American female who presented for evaluation of recent episode of exertional CP, with known CAD. She was last seen here in clinic in June 2011, by Dr. Andee Lineman. Her last ischemic evaluation was a surveillance [employer], June 2011, which yielded a small area of anterior apical ischemia (vs breast attenuation); EF 61%. However, patient denied any exertional CP or SOB, and no further workup was recommended.       She experienced significant exertional CP back in June of this year, after  climbing a full flight of stairs. She took 1 NTG tablet, with complete resolution after about 30 minutes. Her second episode occurred this past Saturday, after walking up a slight incline and a small number of steps. She had a similar sensation of anterior chest "pressure", somewhat worse than the episode back in June, again resolving completely after 1 NTG tablet, within 30 minutes. In both cases, there was no radiation to the jaw or upper extremities, nor any associated diaphoresis or nausea. She did experience exertional dyspnea. She has not had any recurrent CP, since her most recent episode this past Saturday. However, she has remained cautious, and has refrained from any strenuous activity. Both cases are reminiscent of her NS EMI presentation in 2008, but not quite as severe. At that time, she was successfully treated with Taxus DES of a 100% occluded LAD(x2). There was residual 70% CFX and 80% RCA disease; EF 45%.       She underwent cardiac catheterization with Dr. Kirke Corin on 08/22/11. Left Main: The LAD and circumflex originated from separate ostia.  Left Anterior Descending (LAD): The vessel is occluded at the ostium. 2 overlapped stents are noted in the proximal segment. The vessel fills via collaterals from the RCA as well as some collaterals from the left circumflex to the diagonals.  Circumflex (LCx): The vessel is normal in size. 30% stenosis is noted right after giving OM1. In the midsegment there is also 2 separate lesions each causing about 60% stenosis.  1st obtuse marginal: Overall large with a tubular 70% stenosis proximally.  2nd obtuse marginal: Small in size and free of significant disease.  3rd obtuse marginal: Small in size and free of significant disease.  Right Coronary Artery: The vessel is normal in size. There is an 80%  stenosis in the midsegment right after giving RV 1. There is diffuse 20% disease in the midsegment.  posterior descending artery: Normal in size and free of  significant disease. It gives collaterals to the left anterior descending artery. Left ventriculography: Left ventricular systolic function is mildly reduced , LVEF is estimated at 40-45 %, there is no significant mitral regurgitation . There is moderate anterior wall hypokinesis as well as dyskinesias of the apex.       She was then seen in consultation the office by Dr. Tyrone Sage on 08/28/2011 for the consideration of coronary bypass grafting surgery. Potential risks, complications, and benefits of the surgery discussed with the patient and she agreed to proceed. It should be noted that preoperative carotid duplex carotid ultrasound showed a 60-79% right internal carotid artery stenosis but no significant left internal carotid artery stenosis . ABIs were within normal limits. She was [facility] to [facility] on 09/05/2011 inorder to undergo CABGx3.   Brief Hospital Course:  Patient was extubated the day of surgery without difficult.She was weaned off Neo-Synephrine. Her Swan-Ganz, A-line, chest tubes, and Foley were all removed early in his postoperative course. She was found to have acute blood loss anemia postoperatively. Her H&H was low as 7.5 and 21.5. She did receive a transfusion. Her last H&H was up to 8.8 and 26.4 respectively. She was started on Nu Iron and  Folic acid. She was volume overloaded and diuresed accordingly. She was felt surgically stable for transfer from the intensive care unit to PCTU on 09/06/2010. She continued to progress with cardiac rehabilitation. She was tolerating a diet had a bowel movement. Epicardial pacing wires and chest tube sutures will be removed. She remained afebrile and hemodynamically stable. Her heart rate went up into the 120's with ambulation. As a result, her Lopressor was increased to 25 mg po bid. Her glucose remained well controlled on her oral medications.She was seen and evaluated by Dr. Tyrone Sage and was felt surgically stable for discharge on  09/11/2011.  Filed Vitals:   09/09/11 0429  BP: 109/70  Pulse: 105  Temp: 98 F (36.7 C)  Resp: 18     Latest Vital Signs: Blood pressure 109/70, pulse 105, temperature 98 F (36.7 C), temperature source Oral, resp. rate 18, height 5\' 3"  (1.6 m), weight 222 lb 14.4 oz (101.107 kg), SpO2 95.00%.  Physical Exam: Cardiovascular: Slightly tachycardic, no murmurs, gallops, or rubs.  Pulmonary: Slightly decreased at bases bilaterally; no rales, wheezes, or rhonchi.  Abdomen: Soft, non tender, bowel sounds present.  Extremities: Mild bilateral lower extremity edema.  Wounds: Clean and dry. No erythema or signs of infection.  Discharge Condition:Stable  Recent laboratory studies:  Lab Results  Component Value Date   WBC 8.4 09/08/2011   HGB 8.8* 09/08/2011   HCT 26.4* 09/08/2011   MCV 90.1 09/08/2011   PLT 173 09/08/2011   Lab Results  Component Value Date   NA 141 09/08/2011   K 3.8 09/08/2011   CL 103 09/08/2011   CO2 31 09/08/2011   CREATININE 1.09 09/08/2011   GLUCOSE 128* 09/08/2011      Diagnostic Studies: Dg Chest 2 View done 09/11/2011 Findings: Left chest tube has been removed. No pneumothorax.  Moderate atelectasis in the left lower lobe posterior medially,  increased.  Right lung is clear. Evidence of recent CABG. Tiny left effusion.  IMPRESSION:  No pneumothorax after chest tube removal. Atelectasis at the left  base with a tiny left effusion.  Original Report Authenticated By: Fayrene Fearing  H. MAXWELL, M.D.  .   Discharge Medications: Current Discharge Medication List    START taking these medications   Details  folic acid (FOLVITE) 1 MG tablet Take 1 tablet (1 mg total) by mouth daily. For one month then stop. Qty: 30 tablet, Refills: 0    furosemide (LASIX) 40 MG tablet Take 1 tablet (40 mg total) by mouth daily. For 5 days then stop. Qty: 5 tablet, Refills: 0    metoprolol tartrate (LOPRESSOR) 25 MG tablet Take 0.5 tablets (12.5 mg total) by mouth 2 (two) times  daily. Qty: 30 tablet, Refills: 1    oxyCODONE (OXY IR/ROXICODONE) 5 MG immediate release tablet Take 1-2 tablets (5-10 mg total) by mouth every 4 (four) hours as needed for pain. Qty: 45 tablet, Refills: 0    polysaccharide iron (NIFEREX) 150 MG CAPS capsule Take 1 capsule (150 mg total) by mouth daily. For one month then stop. Qty: 30 each, Refills: 0    potassium chloride SA (K-DUR,KLOR-CON) 20 MEQ tablet Take 1 tablet (20 mEq total) by mouth daily. For 5 days then stop. Qty: 5 tablet, Refills: 0      CONTINUE these medications which have CHANGED   Details  aspirin EC 325 MG EC tablet Take 1 tablet (325 mg total) by mouth daily. Qty: 30 tablet      CONTINUE these medications which have NOT CHANGED   Details  allopurinol (ZYLOPRIM) 100 MG tablet Take 100 mg by mouth daily.      B Complex-C (SUPER B COMPLEX PO) Take 1 tablet by mouth daily.      metFORMIN (GLUCOPHAGE) 500 MG tablet Take 1,000 mg by mouth 2 (two) times daily with a meal.      Omega-3 Fatty Acids (FISH OIL) 1000 MG CAPS Take 1 capsule by mouth daily.      pravastatin (PRAVACHOL) 40 MG tablet Take 1 tablet by mouth Daily.      STOP taking these medications     amLODipine (NORVASC) 5 MG tablet      hydrochlorothiazide 25 MG tablet      lisinopril (PRINIVIL,ZESTRIL) 40 MG tablet      metoprolol (TOPROL-XL) 200 MG 24 hr tablet      nitroGLYCERIN (NITROSTAT) 0.4 MG SL tablet      vitamin E 400 UNIT capsule         Follow Up Appointments: Follow-up Information    Follow up with GERHARDT,EDWARD B, MD. (PA/LAT CXR to be taken 45 minutes prior to office appointment  with Dr. Aaron Mose will call with an appointment date and time)    Contact information:   [address] [place] [address] [phone]       Follow up with Peyton Bottoms, MD. (Call for an appointment for 2 weeks)    Contact information:   [address] [place] [place]  [address] [phone]       Follow up with Juliette Alcide, MD. (Call for follow up appointment regarding further diabetes management)    Contact information:   [address] [facility]. [place] [address] [phone]          Signed: Doree Fudge MPA-C 09/09/2011, 12:44 PM

## 2011-09-10 LAB — GLUCOSE, CAPILLARY
Glucose-Capillary: 119 mg/dL — ABNORMAL HIGH (ref 70–99)
Glucose-Capillary: 110 mg/dL — ABNORMAL HIGH (ref 70–99)
Glucose-Capillary: 123 mg/dL — ABNORMAL HIGH (ref 70–99)
Glucose-Capillary: 91 mg/dL (ref 70–99)

## 2011-09-10 NOTE — Progress Notes (Signed)
Met with pt to discuss discharge plans.  PTA, pt independent of ADLS.  Pt's daughter to provide 24hr supervision at discharge.  Pt requests 3 in 1 for home.  Referral to [facility] with [facility] for DME needs.   Jerrell Belfast, RN, BSN Phone #[phone]

## 2011-09-10 NOTE — Progress Notes (Signed)
EPW's D/c at 1320 with no complications per MD order and Hospital protocol.All ends intact. Pt reminded to stay in bed for 1 hr. Will continue to monitor closely.

## 2011-09-10 NOTE — Progress Notes (Signed)
Patient ID: [name], female   DOB: Apr 07, 1946, 66 y.o.   MRN: [id number]                   [address]            [place] [address]          [phone]     5 Days Post-Op Procedure(s) (LRB): CORONARY ARTERY BYPASS GRAFTING (CABG) (N/A)  LOS: 5 days   Subjective: Improved but still tired and mildly winded after walking  Objective: Vital signs in last 24 hours: Patient Vitals for the past 24 hrs:  BP Temp Temp src Pulse Resp SpO2 Weight  09/10/11 0444 116/72 mmHg 98.4 F (36.9 C) Oral 108  17  96 % 220 lb 10.9 oz (100.1 kg)  09/09/11 2036 130/83 mmHg 97.9 F (36.6 C) Oral 116  19  93 % -  09/09/11 1528 117/69 mmHg 98 F (36.7 C) Oral 109  18  90 % -   Wt Readings from Last 3 Encounters:  09/10/11 220 lb 10.9 oz (100.1 kg)  09/10/11 220 lb 10.9 oz (100.1 kg)  09/03/11 220 lb 14.4 oz (100.2 kg)    Hemodynamic parameters for last 24 hours:    Intake/Output from previous day: 01/13 0701 - 01/14 0700 In: -  Out: 400 [Urine:400] Intake/Output this shift:    Scheduled Meds:   . acetaminophen  1,000 mg Oral Q6H   Or  . acetaminophen (TYLENOL) oral liquid 160 mg/5 mL  975 mg Per Tube Q6H  . allopurinol  100 mg Oral Daily  . aspirin EC  325 mg Oral Daily   Or  . aspirin  324 mg Per Tube Daily  . bisacodyl  10 mg Oral Daily   Or  . bisacodyl  10 mg Rectal Daily  . docusate sodium  200 mg Oral Daily  . folic acid  1 mg Oral Daily  . furosemide  40 mg Oral Daily  . insulin aspart  0-24 Units Subcutaneous TID AC & HS  . metFORMIN  500 mg Oral BID WC  . metoprolol tartrate  12.5 mg Oral BID   Or  . metoprolol tartrate  12.5 mg Per Tube BID  . pantoprazole  40 mg Oral Q1200  . polysaccharide iron  150 mg Oral Daily  . potassium chloride  20 mEq Oral Daily  . povidone-iodine  1 application Topical BID  . simvastatin  5 mg Oral q1800  . sodium chloride  3 mL Intravenous Q12H   Continuous Infusions:  PRN Meds:.sodium chloride, levalbuterol,  magnesium hydroxide, ondansetron (ZOFRAN) IV, oxyCODONE, sodium chloride, traMADol, zolpidem  General appearance: alert, cooperative, fatigued and no distress Neurologic: intact Heart: regular rate and rhythm, S1, S2 normal, no murmur, click, rub or gallop Lungs: clear to auscultation bilaterally Extremities: extremities normal, atraumatic, no cyanosis or edema and Homans sign is negative, no sign of DVT Wound: sternum stable Mildly sob off O2 after walking  Lab Results: CBC: Basename 09/08/11 0545  WBC 8.4  HGB 8.8*  HCT 26.4*  PLT 173   BMET:  Basename 09/08/11 0545  NA 141  K 3.8  CL 103  CO2 31  GLUCOSE 128*  BUN 16  CREATININE 1.09  CALCIUM 8.3*    PT/INR: No results found for this basename: LABPROT,INR in the last 72 hours   Radiology No results found.   Assessment/Plan: S/P Procedure(s) (LRB): CORONARY ARTERY BYPASS GRAFTING (CABG) (N/A) Plan dc 09/11/2011 if ambulating  well today   Delight Ovens MD 09/10/2011 8:30 AM

## 2011-09-10 NOTE — Progress Notes (Signed)
CARDIAC REHAB PHASE I   PRE:  Rate/Rhythm: 110 ST    BP: sitting 131/75    SaO2: 95  RA  MODE:  Ambulation: 390 ft   POST:  Rate/Rhythm: 135 ST    BP: sitting 126/70     SaO2: 95 RA  Tolerated well except HR up. Denies palpitations, felt tired toward end. Tolerated well, decreased SOB than previously. To chair. [id number]  [name] CES, ACSM

## 2011-09-10 NOTE — Progress Notes (Signed)
Physical Therapy Treatment Patient Details Name: [name] MRN: [id number] DOB: Oct 20, 1945 Today's Date: 09/10/2011  PT Assessment/Plan  PT - Assessment/Plan Comments on Treatment Session: Pt admitted for CABG and is progressing very well with therapy. Pt only limited by increased HR and RN reports pt to receive lopressor in 30 min. RN aware of activity and HR. Pt without reports of CP or SOB with mobility. Pt educated for sternal precautions and able to Independently state end of session. PT Plan: Discharge plan remains appropriate PT Goals  Acute Rehab PT Goals PT Goal: Rolling Supine to Right Side - Progress: Progressing toward goal PT Goal: Supine/Side to Sit - Progress: Met PT Goal: Sit to Supine/Side - Progress: Progressing toward goal PT Goal: Sit to Stand - Progress: Progressing toward goal PT Goal: Stand to Sit - Progress: Progressing toward goal PT Transfer Goal: Bed to Chair/Chair to Bed - Progress: Progressing toward goal PT Goal: Ambulate - Progress: Met PT Goal: Up/Down Stairs - Progress: Met  PT Treatment Precautions/Restrictions  Precautions Precautions: Sternal Restrictions Weight Bearing Restrictions: No Mobility (including Balance) Bed Mobility Rolling Right: 5: Supervision Rolling Right Details (indicate cue type and reason): cues to roll before side to sit Right Sidelying to Sit: 6: Modified independent (Device/Increase time);HOB flat Transfers Sit to Stand: 5: Supervision Stand to Sit: 5: Supervision Stand to Sit Details: cues for hand placement secondary to precautions Ambulation/Gait Ambulation/Gait Assistance: 6: Modified independent (Device/Increase time) Ambulation/Gait Assistance Details (indicate cue type and reason): cueing for decreased gait speed on return secondary to HR Ambulation Distance (Feet): 350 Feet Assistive device: None Gait Pattern: Within Functional Limits Stairs: Yes Stairs Assistance: 6: Modified independent (Device/Increase  time) Stair Management Technique: One rail Right;Step to pattern;Forwards Number of Stairs: 4  Height of Stairs: 8     Exercise  General Exercises - Lower Extremity Long Arc Quad: AROM;20 reps;Both;Seated Hip Flexion/Marching: AROM;Both;Seated;Other reps (comment) [name]) End of Session PT - End of Session Equipment Utilized During Treatment: Gait belt Activity Tolerance: Patient tolerated treatment well Patient left: in chair;with call bell in reach Nurse Communication: Mobility status for transfers;Mobility status for ambulation General Behavior During Session: [name] for tasks performed Cognition: [name] for tasks performed  [name] 09/10/2011, 9:37 AM Toney Sang, PT [phone]

## 2011-09-11 ENCOUNTER — Inpatient Hospital Stay (HOSPITAL_COMMUNITY): Payer: Medicare Other

## 2011-09-11 DIAGNOSIS — J90 Pleural effusion, not elsewhere classified: Secondary | ICD-10-CM | POA: Diagnosis not present

## 2011-09-11 DIAGNOSIS — Z09 Encounter for follow-up examination after completed treatment for conditions other than malignant neoplasm: Secondary | ICD-10-CM | POA: Diagnosis not present

## 2011-09-11 DIAGNOSIS — J9819 Other pulmonary collapse: Secondary | ICD-10-CM | POA: Diagnosis not present

## 2011-09-11 LAB — CBC
HCT: 28.5 % — ABNORMAL LOW (ref 36.0–46.0)
Hemoglobin: 9.4 g/dL — ABNORMAL LOW (ref 12.0–15.0)
MCH: 29.7 pg (ref 26.0–34.0)
MCHC: 33 g/dL (ref 30.0–36.0)
MCV: 90.2 fL (ref 78.0–100.0)
Platelets: 322 10*3/uL (ref 150–400)
RBC: 3.16 MIL/uL — ABNORMAL LOW (ref 3.87–5.11)
RDW: 14 % (ref 11.5–15.5)
WBC: 8.2 10*3/uL (ref 4.0–10.5)

## 2011-09-11 LAB — BASIC METABOLIC PANEL
BUN: 18 mg/dL (ref 6–23)
CO2: 28 mEq/L (ref 19–32)
Calcium: 9.2 mg/dL (ref 8.4–10.5)
Chloride: 98 mEq/L (ref 96–112)
Creatinine, Ser: 1.15 mg/dL — ABNORMAL HIGH (ref 0.50–1.10)
GFR calc Af Amer: 56 mL/min — ABNORMAL LOW (ref >90)
GFR calc non Af Amer: 48 mL/min — ABNORMAL LOW (ref >90)
Glucose, Bld: 133 mg/dL — ABNORMAL HIGH (ref 70–99)
Potassium: 3.7 mEq/L (ref 3.5–5.1)
Sodium: 140 mEq/L (ref 135–145)

## 2011-09-11 LAB — GLUCOSE, CAPILLARY
Glucose-Capillary: 117 mg/dL — ABNORMAL HIGH (ref 70–99)
Glucose-Capillary: 122 mg/dL — ABNORMAL HIGH (ref 70–99)

## 2011-09-11 MED ORDER — POTASSIUM CHLORIDE CRYS ER 20 MEQ PO TBCR
30.0000 meq | EXTENDED_RELEASE_TABLET | Freq: Once | ORAL | Status: AC
  Administered 2011-09-11: 30 meq via ORAL
  Filled 2011-09-11: qty 1

## 2011-09-11 MED ORDER — METOPROLOL TARTRATE 25 MG PO TABS
25.0000 mg | ORAL_TABLET | Freq: Two times a day (BID) | ORAL | Status: DC
  Administered 2011-09-11: 25 mg via ORAL
  Filled 2011-09-11 (×2): qty 1

## 2011-09-11 MED ORDER — METOPROLOL TARTRATE 25 MG PO TABS: 25.0000 mg | ORAL_TABLET | Freq: Two times a day (BID) | ORAL | Status: DC

## 2011-09-11 MED FILL — Mannitol IV Soln 20%: INTRAVENOUS | Qty: 500 | Status: AC

## 2011-09-11 MED FILL — Lidocaine HCl IV Inj 20 MG/ML: INTRAVENOUS | Qty: 5 | Status: AC

## 2011-09-11 MED FILL — Sodium Bicarbonate IV Soln 8.4%: INTRAVENOUS | Qty: 50 | Status: AC

## 2011-09-11 NOTE — Progress Notes (Addendum)
6 Days Post-Op Procedure(s) (LRB): CORONARY ARTERY BYPASS GRAFTING (CABG) (N/A)  Subjective: Patient feeling fairly well;no complaints.  Objective: Vital signs in last 24 hours: Patient Vitals for the past 24 hrs:  BP Temp Temp src Pulse Resp SpO2 Weight  09/11/11 0500 - - - - - - 217 lb 6 oz (98.6 kg)  09/11/11 0423 109/72 mmHg 98.7 F (37.1 C) Oral 115  18  93 % 217 lb 6 oz (98.6 kg)  09/10/11 2202 124/75 mmHg - - 112  - - -  09/10/11 2157 124/75 mmHg - - 112  - - -  09/10/11 2156 124/75 mmHg - - 112  - - -  09/10/11 2001 148/88 mmHg 98.3 F (36.8 C) Oral 115  18  95 % -  09/10/11 1528 121/71 mmHg - - 109  18  94 % -  09/10/11 1500 131/75 mmHg 98.4 F (36.9 C) Oral 108  20  96 % -  09/10/11 1430 116/68 mmHg 97.8 F (36.6 C) Oral 113  18  96 % -  09/10/11 1400 119/73 mmHg - - 108  17  95 % -  09/10/11 1345 128/70 mmHg 98.7 F (37.1 C) Oral 107  18  98 % -  09/10/11 1330 118/66 mmHg 98.7 F (37.1 C) Oral 107  18  98 % -  09/10/11 1315 132/68 mmHg 98.6 F (37 C) Oral 107  18  96 % -  09/10/11 1311 121/70 mmHg 98.9 F (37.2 C) Oral 108  18  94 % -  09/10/11 1036 118/70 mmHg - - 107  - - -  09/10/11 0931 - - - 120  - 96 % -  09/10/11 0924 - - - 135  - - -   Pre op weight 100  kg Current Weight  09/11/11 217 lb 6 oz (98.6 kg)       Intake/Output from previous day: 01/14 0701 - 01/15 0700 In: 1086 [P.O.:1080; I.V.:6] Out: 1850 [Urine:1850]   Physical Exam:  Cardiovascular: Tachycardic, no murmurs, gallops, or rubs. Pulmonary: Clear to auscultation bilaterally; no rales, wheezes, or rhonchi. Abdomen: Soft, non tender, bowel sounds present. Extremities: Mild bilateral lower extremity edema. Wounds: Clean and dry.  No erythema or signs of infection.  Lab Results: CBC: Basename 09/11/11 0618  WBC 8.2  HGB 9.4*  HCT 28.5*  PLT 322   BMET:  Basename 09/11/11 0618  NA 140  K 3.7  CL 98  CO2 28  GLUCOSE 133*  BUN 18  CREATININE 1.15*  CALCIUM 9.2      PT/INR: No results found for this basename: LABPROT,INR in the last 72 hours ABG:  INR: Will add last result for INR, ABG once components are confirmed Will add last 4 CBG results once components are confirmed  Assessment/Plan:  1. CV - SR/ST. Will increase Lopressor to 25 bid. 2.  Pulmonary - Encourage incentive spirometer.CXR this am shows no pneumothorax, LLL atelectasis, small right pleural effusion. 3. Volume Overload - Continue with diuresis. 4.  Acute blood loss anemia - H/H today 9.4/28.5. 5.Supplement Potassium. 6.Dm-CBGs 123/91/117.Continue current medications. 7.Surgically stable for discharge.   ZIMMERMAN,DONIELLE MPA-C 09/11/2011   Dg Chest 2 View  09/11/2011  *RADIOLOGY REPORT*  Clinical Data: Left base atelectasis.  Chest tube removal.  CHEST - 2 VIEW  Comparison: 09/07/2011  Findings: Left chest tube has been removed.  No pneumothorax. Moderate atelectasis in the left lower lobe posterior medially, increased.  Right lung is clear.  Evidence of recent CABG.  Tiny  left effusion.  IMPRESSION: No pneumothorax after chest tube removal.  Atelectasis at the left base with a tiny left effusion.  Original Report Authenticated By: Gwynn Burly, M.D.   I have seen and examined [name] and agree with the above assessment  and plan. Plan Dc today  Delight Ovens MD [facility] [phone] Office [phone] 09/11/2011 2:19 PM

## 2011-09-11 NOTE — Progress Notes (Signed)
Pt. Discharged 09/11/2011  2:21 PM Discharge instructions reviewed with patient/family. Patient/family verbalized understanding. All Rx's given. Questions answered as needed. Pt. Discharged to home with family/self. Taken off unit via W/C. Blane Worthington

## 2011-09-11 NOTE — Progress Notes (Signed)
CARDIAC REHAB PHASE I   PRE:  Rate/Rhythm: 102 ST    BP: sitting 130/86    SaO2: 96 RA RA  MODE:  Ambulation: 350 ft   POST:  Rate/Rhythm: 124 ST    BP: sitting 140/70     SaO2:   Allowed pt to receive increase Lopressor and waited 1 1/2 hrs. Steady with RW, supervision assist x1. HR 124 ST, asymptomatic. Ed completed. Requests her name be sent to [facility]. [phone]  Harriet Masson CES, ACSM

## 2011-09-14 DIAGNOSIS — Z951 Presence of aortocoronary bypass graft: Secondary | ICD-10-CM | POA: Diagnosis not present

## 2011-09-14 DIAGNOSIS — M7989 Other specified soft tissue disorders: Secondary | ICD-10-CM | POA: Diagnosis not present

## 2011-09-14 DIAGNOSIS — Z79899 Other long term (current) drug therapy: Secondary | ICD-10-CM | POA: Diagnosis not present

## 2011-09-14 DIAGNOSIS — E119 Type 2 diabetes mellitus without complications: Secondary | ICD-10-CM | POA: Diagnosis not present

## 2011-09-14 DIAGNOSIS — S99929A Unspecified injury of unspecified foot, initial encounter: Secondary | ICD-10-CM | POA: Diagnosis not present

## 2011-09-14 DIAGNOSIS — M109 Gout, unspecified: Secondary | ICD-10-CM | POA: Diagnosis not present

## 2011-09-14 DIAGNOSIS — I519 Heart disease, unspecified: Secondary | ICD-10-CM | POA: Diagnosis not present

## 2011-09-14 DIAGNOSIS — M79609 Pain in unspecified limb: Secondary | ICD-10-CM | POA: Diagnosis not present

## 2011-09-14 DIAGNOSIS — Z7982 Long term (current) use of aspirin: Secondary | ICD-10-CM | POA: Diagnosis not present

## 2011-09-14 DIAGNOSIS — S8990XA Unspecified injury of unspecified lower leg, initial encounter: Secondary | ICD-10-CM | POA: Diagnosis not present

## 2011-09-14 DIAGNOSIS — M25579 Pain in unspecified ankle and joints of unspecified foot: Secondary | ICD-10-CM | POA: Diagnosis not present

## 2011-09-26 ENCOUNTER — Other Ambulatory Visit: Payer: Self-pay | Admitting: Cardiothoracic Surgery

## 2011-09-26 DIAGNOSIS — I2510 Atherosclerotic heart disease of native coronary artery without angina pectoris: Secondary | ICD-10-CM

## 2011-09-26 DIAGNOSIS — Z951 Presence of aortocoronary bypass graft: Secondary | ICD-10-CM

## 2011-09-27 ENCOUNTER — Encounter: Payer: Self-pay | Admitting: Cardiothoracic Surgery

## 2011-09-27 ENCOUNTER — Ambulatory Visit
Admission: RE | Admit: 2011-09-27 | Discharge: 2011-09-27 | Disposition: A | Discharge status: Home / Self Care | Payer: Medicare Other | Source: Ambulatory Visit | Attending: Cardiothoracic Surgery | Admitting: Cardiothoracic Surgery

## 2011-09-27 ENCOUNTER — Ambulatory Visit (INDEPENDENT_AMBULATORY_CARE_PROVIDER_SITE_OTHER): Payer: Self-pay | Admitting: Cardiothoracic Surgery

## 2011-09-27 VITALS — BP 133/85 | HR 87 | Resp 20 | Ht 63.0 in | Wt 218.0 lb

## 2011-09-27 DIAGNOSIS — Z951 Presence of aortocoronary bypass graft: Secondary | ICD-10-CM

## 2011-09-27 DIAGNOSIS — J984 Other disorders of lung: Secondary | ICD-10-CM | POA: Diagnosis not present

## 2011-09-27 DIAGNOSIS — I2510 Atherosclerotic heart disease of native coronary artery without angina pectoris: Secondary | ICD-10-CM

## 2011-09-27 NOTE — Patient Instructions (Signed)
Coronary Artery Bypass Grafting  Care After  Refer to this sheet in the next few weeks. These instructions provide you with information on caring for yourself after your procedure. Your caregiver may also give you more specific instructions. Your treatment has been planned according to current medical practices, but problems sometimes occur. Call your caregiver if you have any problems or questions after your procedure.  Recovery from open heart surgery will be different for everyone. Some people feel well after 3 or 4 weeks, while for others it takes longer. After heart surgery, it may be normal to:  Not have an appetite, feel nauseated by the smell of food, or only want to eat a small amount.   Be constipated because of changes in your diet, activity, and medicines. Eat foods high in fiber. Add fresh fruits and vegetables to your diet. Stool softeners may be helpful.   Feel sad or unhappy. You may be frustrated or cranky. You may have good days and bad days. Do not give up. Talk to your caregiver if you do not feel better.   Feel weakness and fatigue. You many need physical therapy or cardiac rehabilitation to get your strength back.   Develop an irregular heartbeat called atrial fibrillation. Symptoms of atrial fibrillation are a fast, irregular heartbeat or feelings of fluttery heartbeats, shortness of breath, low blood pressure, and dizziness. If these symptoms develop, see your caregiver right away.  MEDICATION  Have a list of all the medicines you will be taking when you leave the hospital. For every medicine, know the following:   Name.   Exact dose.   Time of day to be taken.   How often it should be taken.   Why you are taking it.   Ask which medicines should or should not be taken together. If you take more than one heart medicine, ask if it is okay to take them together. Some heart medicines should not be taken at the same time because they may lower your blood pressure too  much.   Narcotic pain medicine can cause constipation. Eat fresh fruits and vegetables. Add fiber to your diet. Stool softener medicine may help relieve constipation.   Keep a copy of your medicines with you at all times.   Do not add or stop taking any medicine until you check with your caregiver.   Medicines can have side effects. Call your caregiver who prescribed the medicine if you:   Start throwing up, have diarrhea, or have stomach pain.   Feel dizzy or lightheaded when you stand up.   Feel your heart is skipping beats or is beating too fast or too slow.   Develop a rash.   Notice unusual bruising or bleeding.  HOME CARE INSTRUCTIONS  After heart surgery, it is important to learn how to take your pulse. Have your caregiver show you how to take your pulse.   Use your incentive spirometer. Ask your caregiver how long after surgery you need to use it.  Care of your chest incision  Tell your caregiver right away if you notice clicking in your chest (sternum).   Support your chest with a pillow or your arms when you take deep breaths and cough.   Follow your caregiver's instructions about when you can bathe or swim.   Protect your incision from sunlight during the first year to keep the scar from getting dark.   Tell your caregiver if you notice:   Increased tenderness of your incision.     Increased redness or swelling around your incision.   Drainage or pus from your incision.  Care of your leg incision(s)  Avoid crossing your legs.   Avoid sitting for long periods of time. Change positions every half hour.   Elevate your leg(s) when you are sitting.   Check your leg(s) daily for swelling. Check the incisions for redness or drainage.   Diet is very important to heart health.   Eat plenty of fresh fruits and vegetables. Meats should be lean cut. Avoid canned, processed, and fried foods.   Talk to a dietician. They can teach you how to make healthy food and  drink choices.  Weight  Weigh yourself every day. This is important because it helps to know if you are retaining fluid that may make your heart and lungs work harder.   Use the same scale each time.   Weigh yourself every morning at the same time. You should do this after you go to the bathroom, but before you eat breakfast.   Your weight will be more accurate if you do not wear any clothes.   Record your weight.   Tell your caregiver if you have gained 2 pounds or more overnight.  Activity Stop any activity at once if you have chest pain, shortness of breath, irregular heartbeats, or dizziness. Get help right away if you have any of these symptoms.  Bathing.  Avoid soaking in a bath or hot tub until your incisions are healed.   Rest. You need a balance of rest and activity.   Exercise. Exercise per your caregiver's advice. You may need physical therapy or cardiac rehabilitation to help strengthen your muscles and build your endurance.   Climbing stairs. Unless your caregiver tells you not to climb stairs, go up stairs slowly and rest if you tire. Do not pull yourself up by the handrail.   Driving a car. Follow your caregiver's advice on when you may drive. You may ride as a passenger at any time. When traveling for long periods of time in a car, get out of the car and walk around for a few minutes every 2 hours.   Lifting. Avoid lifting, pushing, or pulling anything heavier than 10 pounds for 6 weeks after surgery or as told by your caregiver.   Returning to work. Check with your caregiver. People heal at different rates. Most people will be able to go back to work 6 to 12 weeks after surgery.   Sexual activity. You may resume sexual relations as told by your caregiver.  SEEK MEDICAL CARE IF:  Any of your incisions are red, painful, or have any type of drainage coming from them.   You have an oral temperature above 101.5 F .   You have ankle or leg swelling.   You have pain  in your legs.   You have weight gain of 2 or more pounds a day.   You feel dizzy or lightheaded when you stand up.  SEEK IMMEDIATE MEDICAL CARE IF:  You have angina or chest pain that goes to your jaw or arms. Call your local emergency services right away.   You have shortness of breath at rest or with activity.   You have a fast or irregular heartbeat (arrhythmia).   There is a "clicking" in your sternum when you move.   You have numbness or weakness in your arms or legs.  MAKE SURE YOU:  Understand these instructions.   Will watch your condition.   Will   get help right away if you are not doing well or get worse.     

## 2011-09-27 NOTE — Progress Notes (Signed)
[address]            [place] [address]          [id number]       [name] [facility] Medical Record #[id number] Date of Birth: [birth year]  Chelsea Ouch, MD Chelsea Alcide, MD, MD  Chief Complaint:   PostOp Follow Up Visit SURGICAL PROCEDURE: Coronary artery bypass grafting x3 with left  internal mammary to the left anterior descending coronary artery,  reverse saphenous vein graft to the obtuse marginal coronary artery,  reverse saphenous vein graft to the acute marginal with left leg and  calf and thigh endo-vein harvesting.  09/05/2011   History of Present Illness:     Patient is doing well postoperatively her daughter notes that her ability to exert herself is much improved than it was preoperatively. She did have an episode of gout requiring a course of colchicine postoperatively, this is now completely resolved. She's had no evidence of congestive heart failure or recurrent angina. She's been staying with her daughter in [place] but today he is returning home to [place].   History  Smoking status  . Never Smoker   Smokeless tobacco  . Never Used       No Known Allergies  Current Outpatient Prescriptions  Medication Sig Dispense Refill  . allopurinol (ZYLOPRIM) 100 MG tablet Take 100 mg by mouth daily.        [name] aspirin 325 MG tablet Take 325 mg by mouth daily.      . B Complex-C (SUPER B COMPLEX PO) Take 1 tablet by mouth daily.        . cholecalciferol (VITAMIN D) 400 UNITS TABS Take 400 Units by mouth daily.      . folic acid (FOLVITE) 1 MG tablet Take 1 tablet (1 mg total) by mouth daily. For one month then stop.  30 tablet  0  . metFORMIN (GLUCOPHAGE) 500 MG tablet Take 1,000 mg by mouth 2 (two) times daily with a meal.        . metoprolol tartrate (LOPRESSOR) 25 MG tablet Take 1 tablet (25 mg total) by mouth 2 (two) times daily.  30 tablet  1  . Omega-3 Fatty Acids (FISH OIL) 1000 MG CAPS Take 1 capsule by  mouth daily.        . polysaccharide iron (NIFEREX) 150 MG CAPS capsule Take 1 capsule (150 mg total) by mouth daily. For one month then stop.  30 each  0  . pravastatin (PRAVACHOL) 40 MG tablet Take 1 tablet by mouth Daily.           Physical Exam: Resp 20  Ht 5\' 3"  (1.6 m)  Wt 218 lb (98.884 kg)  BMI 38.62 kg/m2  SpO2 97%  General appearance: alert and cooperative Neurologic: intact Heart: regular rate and rhythm, S1, S2 normal, no murmur, click, rub or gallop Lungs: clear to auscultation bilaterally Abdomen: soft, non-tender; bowel sounds normal; no masses,  no organomegaly Extremities: extremities normal, atraumatic, no cyanosis or edema and edema Mild edema at the left ankle vein harvest sites are well-healed Wound: Sternum is stable and well healed   Diagnostic Studies & Laboratory data:         Recent Radiology Findings: Dg Chest 2 View  09/27/2011  *RADIOLOGY REPORT*  Clinical Data: Follow-up CABG.  CHEST - 2 VIEW  Comparison: Prior examinations ranging  from 09/03/2011 through 09/11/2011.  Findings: The heart size and mediastinal contours are stable status post CABG.  The right lung is clear.  There is increased left lower lobe air space disease with associated air bronchograms.  There may be a small subpulmonic left pleural effusion.  No significant pleural fluid is seen on the right.  There is no pneumothorax or edema.  IMPRESSION: Mild worsening of left lower lobe air space disease with possible associated subpulmonic effusion.  Pneumonia not excluded.  Original Report Authenticated By: Gerrianne Scale, M.D.      Recent Labs: Lab Results  Component Value Date   WBC 8.2 09/11/2011   HGB 9.4* 09/11/2011   HCT 28.5* 09/11/2011   PLT 322 09/11/2011   GLUCOSE 133* 09/11/2011   ALT 23 09/03/2011   AST 19 09/03/2011   NA 140 09/11/2011   K 3.7 09/11/2011   CL 98 09/11/2011   CREATININE 1.15* 09/11/2011   BUN 18 09/11/2011   CO2 28 09/11/2011   INR 1.20 09/05/2011   HGBA1C 7.1*  09/03/2011      Assessment / Plan:      Patient is doing well postoperatively following coronary artery bypass grafting. I've encouraged to enroll in the cardiac rehabilitation program and [place], which she is anxious to do. I've not made her return appointment to see me but would be glad to see a that her or Dr. Tommi Rumps gent's  Request.   Chelsea Ovens MD 09/27/2011 1:01 PM

## 2011-09-28 ENCOUNTER — Encounter: Payer: Self-pay | Admitting: Cardiovascular Disease

## 2011-09-28 ENCOUNTER — Ambulatory Visit (INDEPENDENT_AMBULATORY_CARE_PROVIDER_SITE_OTHER): Payer: Medicare Other | Admitting: Cardiovascular Disease

## 2011-09-28 DIAGNOSIS — I2510 Atherosclerotic heart disease of native coronary artery without angina pectoris: Secondary | ICD-10-CM | POA: Diagnosis not present

## 2011-09-28 DIAGNOSIS — E785 Hyperlipidemia, unspecified: Secondary | ICD-10-CM

## 2011-09-28 DIAGNOSIS — Z951 Presence of aortocoronary bypass graft: Secondary | ICD-10-CM

## 2011-09-28 DIAGNOSIS — I10 Essential (primary) hypertension: Secondary | ICD-10-CM

## 2011-09-28 MED ORDER — LISINOPRIL 10 MG PO TABS: 10.0000 mg | ORAL_TABLET | Freq: Every day | ORAL | Status: DC

## 2011-09-28 NOTE — Patient Instructions (Addendum)
   Lisinopril 10mg  daily Your physician recommends that you go to the [facility] for lab work for FLP & CMET If the results of your test are normal or stable, you will receive a letter.  If they are abnormal, the nurse will contact you by phone. Referral to Cardiac Rehab at [facility]  Chest x-ray in 1 month Your physician wants you to follow up in:  3 months.  You will receive a reminder letter in the mail one-two months in advance.  If you don't receive a letter, please call our office to schedule the follow up appointment

## 2011-10-01 ENCOUNTER — Encounter: Payer: Self-pay | Admitting: Cardiovascular Disease

## 2011-10-01 NOTE — Assessment & Plan Note (Signed)
The patient seems to be doing very well after recent coronary artery bypass graft surgery. Her chest tightness has resolved. Continue full dose aspirin as well as metoprolol. I discussed with her the importance of lifestyle changes in controlling her risk factors. I will refer her to cardiac rehabilitation at [facility].  The patient had persistent left pleural effusion and her chest x-ray yesterday which was overall small. She's not having symptoms suggestive of angina she does not seem to be fluid overloaded. I will request a followup chest x-ray in one month to ensure resolution.

## 2011-10-01 NOTE — Assessment & Plan Note (Signed)
I suspect that the patient would need a more potent statin then Pravachol. I will request fasting lipid and liver profile.

## 2011-10-01 NOTE — Progress Notes (Signed)
HPI  This is a 66 year old female who is here today for a followup visit. She was found to have three-vessel coronary artery disease on cardiac catheterization with occluded LAD stents. She underwent coronary artery bypass graft surgery without complications. Overall she is doing very well. Her chest tightness has resolved. Her dyspnea is improving gradually. She is currently off diuretics. She had a chest x-ray done which showed persistence of a small left-sided pleural effusion with atelectasis. She has no symptoms suggestive of pneumonia. Before the surgery, she used to be on lisinopril and amlodipine. However, her blood pressure has been somewhat on the low side and currently she is only on metoprolol.  No Known Allergies   Current Outpatient Prescriptions on File Prior to Visit  Medication Sig Dispense Refill  . allopurinol (ZYLOPRIM) 100 MG tablet Take 100 mg by mouth daily.        [name] aspirin 325 MG tablet Take 325 mg by mouth daily.      . B Complex-C (SUPER B COMPLEX PO) Take 1 tablet by mouth daily.        . cholecalciferol (VITAMIN D) 400 UNITS TABS Take 400 Units by mouth daily.      . folic acid (FOLVITE) 1 MG tablet Take 1 tablet (1 mg total) by mouth daily. For one month then stop.  30 tablet  0  . metFORMIN (GLUCOPHAGE) 500 MG tablet Take 1,000 mg by mouth 2 (two) times daily with a meal.        . metoprolol tartrate (LOPRESSOR) 25 MG tablet Take 1 tablet (25 mg total) by mouth 2 (two) times daily.  30 tablet  1  . Omega-3 Fatty Acids (FISH OIL) 1000 MG CAPS Take 1 capsule by mouth daily.        . polysaccharide iron (NIFEREX) 150 MG CAPS capsule Take 1 capsule (150 mg total) by mouth daily. For one month then stop.  30 each  0  . pravastatin (PRAVACHOL) 40 MG tablet Take 1 tablet by mouth Daily.         Past Medical History  Diagnosis Date  . CAD (coronary artery disease)     Non-STEMI/[place] stenting 100% left anterior descending (2), January 2008. Residual 70% circumflex;80%  right coronary artery. Mild LVD (EF 45%);improved to 55-60% by 2-D echocardiogram,January 2009.   [name] Dyslipidemia     takes Pravastatin daily  . Obesity   . Lumbar herniated disc   . Gout     takes Allopurinol daily  . Unspecified essential hypertension     takes Metoprolol,Lisinopril,and Amlodipine  . Myocardial infarction 2008    pt has 2 stents  . Aortic stenosis   . AV block, 1st degree   . Sleep apnea     with exertion-occasionally  . Arthritis   . Back pain     occasionally with weather changes  . Herniated disc     left  . Urinary frequency   . Nocturia   . DM (diabetes mellitus)     takes Metformin 1000mg  BID  . Early cataracts, bilateral      Past Surgical History  Procedure Date  . Back surgery 2009/2010/2011  . Carpal tunnel release     bilateral  . Cholecystectomy not sure  . Breast biopsy unknown    left  . Leg tendon surgery unknown    left   . Abdominal hysterectomy   . Tonsillectomy   . Colonoscopy   . Coronary artery bypass graft 09/05/2011    Procedure:  CORONARY ARTERY BYPASS GRAFTING (CABG);  Surgeon: Delight Ovens, MD;  Location: [facility] OR;  Service: Open Heart Surgery;  Laterality: N/A;  CABG times three using left internal mammary artery and left leg greater saphenous vein harvested endoscopically  . Cardiac catheterization 2008    NON-STEMI/TAXUS STENTING 100% PROXIMAL LAD (X2) JANUARY 2008  . Cardiac catheterization 2012    occluded LAD stents, Om1 : 70%, RCA: 80%, Ef 40-45%     Family History  Problem Relation Age of Onset  . Heart attack Sister 48  . Heart attack Brother 40  . Anesthesia problems Neg Hx   . Hypotension Neg Hx   . Malignant hyperthermia Neg Hx   . Pseudochol deficiency Neg Hx      History   Social History  . Marital Status: Legally Separated    Spouse Name: N/A    Number of Children: N/A  . Years of Education: N/A   Occupational History  . Not on file.   Social History Main Topics  . Smoking status: Never  Smoker   . Smokeless tobacco: Never Used  . Alcohol Use: No  . Drug Use: No  . Sexually Active: Yes   Other Topics Concern  . Not on file   Social History Narrative  . No narrative on file     PHYSICAL EXAM   BP 132/82  Pulse 96  Ht 5\' 4"  (1.626 m)  Wt 217 lb (98.431 kg)  BMI 37.25 kg/m2  Constitutional: She is oriented to person, place, and time. She appears well-developed and well-nourished. No distress.  HENT: No nasal discharge.  Head: Normocephalic and atraumatic.  Eyes: Pupils are equal and round. Right eye exhibits no discharge. Left eye exhibits no discharge.  Neck: Normal range of motion. Neck supple. No JVD present. No thyromegaly present.  Cardiovascular: Normal rate, regular rhythm, normal heart sounds. Exam reveals no gallop and no friction rub. No murmur heard. The surgical scar is healing. Pulmonary/Chest: Effort normal and breath sounds normal. No stridor. No respiratory distress. She has no wheezes. She has no rales. She exhibits no tenderness.  Abdominal: Soft. Bowel sounds are normal. She exhibits no distension. There is no tenderness. There is no rebound and no guarding.  Musculoskeletal: Normal range of motion. She exhibits no edema and no tenderness.  Neurological: She is alert and oriented to person, place, and time. Coordination normal.  Skin: Skin is warm and dry. No rash noted. She is not diaphoretic. No erythema. No pallor.  Psychiatric: She has a normal mood and affect. Her behavior is normal. Judgment and thought content normal.      ASSESSMENT AND PLAN

## 2011-10-01 NOTE — Assessment & Plan Note (Signed)
The patient used to be on amlodipine and lisinopril before the surgery. Today, I will resume lisinopril 10 mg once daily and check basic metabolic profile in one week. The dose can be increased gradually based on the need.

## 2011-10-08 DIAGNOSIS — I2510 Atherosclerotic heart disease of native coronary artery without angina pectoris: Secondary | ICD-10-CM | POA: Diagnosis not present

## 2011-10-08 DIAGNOSIS — I10 Essential (primary) hypertension: Secondary | ICD-10-CM | POA: Diagnosis not present

## 2011-10-09 ENCOUNTER — Encounter: Payer: Self-pay | Admitting: *Deleted

## 2011-10-10 DIAGNOSIS — M545 Low back pain, unspecified: Secondary | ICD-10-CM | POA: Diagnosis not present

## 2011-10-10 DIAGNOSIS — IMO0001 Reserved for inherently not codable concepts without codable children: Secondary | ICD-10-CM | POA: Diagnosis not present

## 2011-10-10 DIAGNOSIS — N189 Chronic kidney disease, unspecified: Secondary | ICD-10-CM | POA: Diagnosis not present

## 2011-10-10 DIAGNOSIS — M109 Gout, unspecified: Secondary | ICD-10-CM | POA: Diagnosis not present

## 2011-10-10 DIAGNOSIS — E782 Mixed hyperlipidemia: Secondary | ICD-10-CM | POA: Diagnosis not present

## 2011-10-10 DIAGNOSIS — I248 Other forms of acute ischemic heart disease: Secondary | ICD-10-CM | POA: Diagnosis not present

## 2011-10-10 DIAGNOSIS — I10 Essential (primary) hypertension: Secondary | ICD-10-CM | POA: Diagnosis not present

## 2011-10-12 ENCOUNTER — Telehealth: Payer: Self-pay | Admitting: *Deleted

## 2011-10-12 MED ORDER — METOPROLOL TARTRATE 25 MG PO TABS: 25.0000 mg | ORAL_TABLET | Freq: Two times a day (BID) | ORAL | Status: DC

## 2011-10-12 NOTE — Telephone Encounter (Signed)
Addended by: Arlyss Gandy on: 10/12/2011 03:32 PM   Modules accepted: Orders

## 2011-10-12 NOTE — Telephone Encounter (Signed)
metoprolol tartrate (LOPRESSOR) 25 MG tablet 30 tablet 1 09/11/2011 Sig - Route: Take 1 tablet (25 mg total) by mouth 2 (two) times daily. - Oral Needs refill called to [facility]

## 2011-10-22 ENCOUNTER — Telehealth: Payer: Self-pay | Admitting: *Deleted

## 2011-10-22 NOTE — Telephone Encounter (Signed)
Today patient came in for cardiac rehab and  BP 182/106. Last week was 194/106 after exercise of 6 minute walk but did came down to 160/99. Please advise.

## 2011-10-22 NOTE — Telephone Encounter (Signed)
Increase Lisinopril to 40 mg daily (#30 with 6 refills) and add Amlodipine to 5 mg once daily (#30 with 6 refills). She was on these medications before her surgery.

## 2011-10-23 DIAGNOSIS — E119 Type 2 diabetes mellitus without complications: Secondary | ICD-10-CM | POA: Diagnosis not present

## 2011-10-23 DIAGNOSIS — I10 Essential (primary) hypertension: Secondary | ICD-10-CM | POA: Diagnosis not present

## 2011-10-23 DIAGNOSIS — Z951 Presence of aortocoronary bypass graft: Secondary | ICD-10-CM | POA: Diagnosis not present

## 2011-10-23 DIAGNOSIS — I2510 Atherosclerotic heart disease of native coronary artery without angina pectoris: Secondary | ICD-10-CM | POA: Diagnosis not present

## 2011-10-23 DIAGNOSIS — E785 Hyperlipidemia, unspecified: Secondary | ICD-10-CM | POA: Diagnosis not present

## 2011-10-23 DIAGNOSIS — Z5189 Encounter for other specified aftercare: Secondary | ICD-10-CM | POA: Diagnosis not present

## 2011-10-23 MED ORDER — AMLODIPINE BESYLATE 5 MG PO TABS: 5.0000 mg | ORAL_TABLET | Freq: Every day | ORAL | Status: DC

## 2011-10-23 MED ORDER — LISINOPRIL 40 MG PO TABS: 40.0000 mg | ORAL_TABLET | Freq: Every day | ORAL | Status: DC

## 2011-10-23 NOTE — Telephone Encounter (Signed)
Addended by: Eustace Moore on: 10/23/2011 11:50 AM   Modules accepted: Orders

## 2011-10-23 NOTE — Telephone Encounter (Signed)
Patient informed. Patient said she already has both of these at home already. Nurse will still notify [facility] of the change.

## 2011-10-26 DIAGNOSIS — Z951 Presence of aortocoronary bypass graft: Secondary | ICD-10-CM | POA: Diagnosis not present

## 2011-10-26 DIAGNOSIS — I2510 Atherosclerotic heart disease of native coronary artery without angina pectoris: Secondary | ICD-10-CM | POA: Diagnosis not present

## 2011-10-26 DIAGNOSIS — I10 Essential (primary) hypertension: Secondary | ICD-10-CM | POA: Diagnosis not present

## 2011-10-26 DIAGNOSIS — J90 Pleural effusion, not elsewhere classified: Secondary | ICD-10-CM | POA: Diagnosis not present

## 2011-10-29 DIAGNOSIS — M109 Gout, unspecified: Secondary | ICD-10-CM | POA: Diagnosis not present

## 2011-10-29 DIAGNOSIS — Z5189 Encounter for other specified aftercare: Secondary | ICD-10-CM | POA: Diagnosis not present

## 2011-10-29 DIAGNOSIS — I252 Old myocardial infarction: Secondary | ICD-10-CM | POA: Diagnosis not present

## 2011-10-29 DIAGNOSIS — Z9861 Coronary angioplasty status: Secondary | ICD-10-CM | POA: Diagnosis not present

## 2011-10-29 DIAGNOSIS — E785 Hyperlipidemia, unspecified: Secondary | ICD-10-CM | POA: Diagnosis not present

## 2011-10-29 DIAGNOSIS — Z951 Presence of aortocoronary bypass graft: Secondary | ICD-10-CM | POA: Diagnosis not present

## 2011-10-29 DIAGNOSIS — I2510 Atherosclerotic heart disease of native coronary artery without angina pectoris: Secondary | ICD-10-CM | POA: Diagnosis not present

## 2011-10-29 DIAGNOSIS — E119 Type 2 diabetes mellitus without complications: Secondary | ICD-10-CM | POA: Diagnosis not present

## 2011-10-29 DIAGNOSIS — I10 Essential (primary) hypertension: Secondary | ICD-10-CM | POA: Diagnosis not present

## 2011-10-30 DIAGNOSIS — E785 Hyperlipidemia, unspecified: Secondary | ICD-10-CM | POA: Diagnosis not present

## 2011-10-30 DIAGNOSIS — Z5189 Encounter for other specified aftercare: Secondary | ICD-10-CM | POA: Diagnosis not present

## 2011-10-30 DIAGNOSIS — I2510 Atherosclerotic heart disease of native coronary artery without angina pectoris: Secondary | ICD-10-CM | POA: Diagnosis not present

## 2011-10-30 DIAGNOSIS — E119 Type 2 diabetes mellitus without complications: Secondary | ICD-10-CM | POA: Diagnosis not present

## 2011-10-30 DIAGNOSIS — I252 Old myocardial infarction: Secondary | ICD-10-CM | POA: Diagnosis not present

## 2011-10-30 DIAGNOSIS — I10 Essential (primary) hypertension: Secondary | ICD-10-CM | POA: Diagnosis not present

## 2011-10-31 ENCOUNTER — Encounter: Payer: Self-pay | Admitting: *Deleted

## 2011-11-01 ENCOUNTER — Encounter: Payer: Self-pay | Admitting: Cardiovascular Disease

## 2011-11-01 DIAGNOSIS — I10 Essential (primary) hypertension: Secondary | ICD-10-CM | POA: Diagnosis not present

## 2011-11-01 DIAGNOSIS — E785 Hyperlipidemia, unspecified: Secondary | ICD-10-CM | POA: Diagnosis not present

## 2011-11-01 DIAGNOSIS — E119 Type 2 diabetes mellitus without complications: Secondary | ICD-10-CM | POA: Diagnosis not present

## 2011-11-01 DIAGNOSIS — I252 Old myocardial infarction: Secondary | ICD-10-CM | POA: Diagnosis not present

## 2011-11-01 DIAGNOSIS — Z5189 Encounter for other specified aftercare: Secondary | ICD-10-CM | POA: Diagnosis not present

## 2011-11-01 DIAGNOSIS — I2510 Atherosclerotic heart disease of native coronary artery without angina pectoris: Secondary | ICD-10-CM | POA: Diagnosis not present

## 2011-11-05 DIAGNOSIS — I2510 Atherosclerotic heart disease of native coronary artery without angina pectoris: Secondary | ICD-10-CM | POA: Diagnosis not present

## 2011-11-05 DIAGNOSIS — I252 Old myocardial infarction: Secondary | ICD-10-CM | POA: Diagnosis not present

## 2011-11-05 DIAGNOSIS — Z5189 Encounter for other specified aftercare: Secondary | ICD-10-CM | POA: Diagnosis not present

## 2011-11-05 DIAGNOSIS — E785 Hyperlipidemia, unspecified: Secondary | ICD-10-CM | POA: Diagnosis not present

## 2011-11-05 DIAGNOSIS — I10 Essential (primary) hypertension: Secondary | ICD-10-CM | POA: Diagnosis not present

## 2011-11-05 DIAGNOSIS — E119 Type 2 diabetes mellitus without complications: Secondary | ICD-10-CM | POA: Diagnosis not present

## 2011-11-06 DIAGNOSIS — I10 Essential (primary) hypertension: Secondary | ICD-10-CM | POA: Diagnosis not present

## 2011-11-06 DIAGNOSIS — Z5189 Encounter for other specified aftercare: Secondary | ICD-10-CM | POA: Diagnosis not present

## 2011-11-06 DIAGNOSIS — E785 Hyperlipidemia, unspecified: Secondary | ICD-10-CM | POA: Diagnosis not present

## 2011-11-06 DIAGNOSIS — I2510 Atherosclerotic heart disease of native coronary artery without angina pectoris: Secondary | ICD-10-CM | POA: Diagnosis not present

## 2011-11-06 DIAGNOSIS — I252 Old myocardial infarction: Secondary | ICD-10-CM | POA: Diagnosis not present

## 2011-11-06 DIAGNOSIS — E119 Type 2 diabetes mellitus without complications: Secondary | ICD-10-CM | POA: Diagnosis not present

## 2011-11-08 DIAGNOSIS — E785 Hyperlipidemia, unspecified: Secondary | ICD-10-CM | POA: Diagnosis not present

## 2011-11-08 DIAGNOSIS — Z5189 Encounter for other specified aftercare: Secondary | ICD-10-CM | POA: Diagnosis not present

## 2011-11-08 DIAGNOSIS — I10 Essential (primary) hypertension: Secondary | ICD-10-CM | POA: Diagnosis not present

## 2011-11-08 DIAGNOSIS — I252 Old myocardial infarction: Secondary | ICD-10-CM | POA: Diagnosis not present

## 2011-11-08 DIAGNOSIS — E119 Type 2 diabetes mellitus without complications: Secondary | ICD-10-CM | POA: Diagnosis not present

## 2011-11-08 DIAGNOSIS — I2510 Atherosclerotic heart disease of native coronary artery without angina pectoris: Secondary | ICD-10-CM | POA: Diagnosis not present

## 2011-11-12 DIAGNOSIS — E119 Type 2 diabetes mellitus without complications: Secondary | ICD-10-CM | POA: Diagnosis not present

## 2011-11-12 DIAGNOSIS — Z5189 Encounter for other specified aftercare: Secondary | ICD-10-CM | POA: Diagnosis not present

## 2011-11-12 DIAGNOSIS — E785 Hyperlipidemia, unspecified: Secondary | ICD-10-CM | POA: Diagnosis not present

## 2011-11-12 DIAGNOSIS — I2510 Atherosclerotic heart disease of native coronary artery without angina pectoris: Secondary | ICD-10-CM | POA: Diagnosis not present

## 2011-11-12 DIAGNOSIS — I10 Essential (primary) hypertension: Secondary | ICD-10-CM | POA: Diagnosis not present

## 2011-11-12 DIAGNOSIS — I252 Old myocardial infarction: Secondary | ICD-10-CM | POA: Diagnosis not present

## 2011-11-13 DIAGNOSIS — I2510 Atherosclerotic heart disease of native coronary artery without angina pectoris: Secondary | ICD-10-CM | POA: Diagnosis not present

## 2011-11-13 DIAGNOSIS — E785 Hyperlipidemia, unspecified: Secondary | ICD-10-CM | POA: Diagnosis not present

## 2011-11-13 DIAGNOSIS — Z5189 Encounter for other specified aftercare: Secondary | ICD-10-CM | POA: Diagnosis not present

## 2011-11-13 DIAGNOSIS — E119 Type 2 diabetes mellitus without complications: Secondary | ICD-10-CM | POA: Diagnosis not present

## 2011-11-13 DIAGNOSIS — I10 Essential (primary) hypertension: Secondary | ICD-10-CM | POA: Diagnosis not present

## 2011-11-13 DIAGNOSIS — I252 Old myocardial infarction: Secondary | ICD-10-CM | POA: Diagnosis not present

## 2011-11-15 DIAGNOSIS — E119 Type 2 diabetes mellitus without complications: Secondary | ICD-10-CM | POA: Diagnosis not present

## 2011-11-15 DIAGNOSIS — I252 Old myocardial infarction: Secondary | ICD-10-CM | POA: Diagnosis not present

## 2011-11-15 DIAGNOSIS — Z5189 Encounter for other specified aftercare: Secondary | ICD-10-CM | POA: Diagnosis not present

## 2011-11-15 DIAGNOSIS — I2510 Atherosclerotic heart disease of native coronary artery without angina pectoris: Secondary | ICD-10-CM | POA: Diagnosis not present

## 2011-11-15 DIAGNOSIS — I10 Essential (primary) hypertension: Secondary | ICD-10-CM | POA: Diagnosis not present

## 2011-11-15 DIAGNOSIS — E785 Hyperlipidemia, unspecified: Secondary | ICD-10-CM | POA: Diagnosis not present

## 2011-11-19 DIAGNOSIS — I252 Old myocardial infarction: Secondary | ICD-10-CM | POA: Diagnosis not present

## 2011-11-19 DIAGNOSIS — Z5189 Encounter for other specified aftercare: Secondary | ICD-10-CM | POA: Diagnosis not present

## 2011-11-19 DIAGNOSIS — I10 Essential (primary) hypertension: Secondary | ICD-10-CM | POA: Diagnosis not present

## 2011-11-19 DIAGNOSIS — I2510 Atherosclerotic heart disease of native coronary artery without angina pectoris: Secondary | ICD-10-CM | POA: Diagnosis not present

## 2011-11-19 DIAGNOSIS — E119 Type 2 diabetes mellitus without complications: Secondary | ICD-10-CM | POA: Diagnosis not present

## 2011-11-19 DIAGNOSIS — E785 Hyperlipidemia, unspecified: Secondary | ICD-10-CM | POA: Diagnosis not present

## 2011-11-20 DIAGNOSIS — Z5189 Encounter for other specified aftercare: Secondary | ICD-10-CM | POA: Diagnosis not present

## 2011-11-20 DIAGNOSIS — E785 Hyperlipidemia, unspecified: Secondary | ICD-10-CM | POA: Diagnosis not present

## 2011-11-20 DIAGNOSIS — E119 Type 2 diabetes mellitus without complications: Secondary | ICD-10-CM | POA: Diagnosis not present

## 2011-11-20 DIAGNOSIS — I252 Old myocardial infarction: Secondary | ICD-10-CM | POA: Diagnosis not present

## 2011-11-20 DIAGNOSIS — I10 Essential (primary) hypertension: Secondary | ICD-10-CM | POA: Diagnosis not present

## 2011-11-20 DIAGNOSIS — I2510 Atherosclerotic heart disease of native coronary artery without angina pectoris: Secondary | ICD-10-CM | POA: Diagnosis not present

## 2011-11-22 DIAGNOSIS — I2510 Atherosclerotic heart disease of native coronary artery without angina pectoris: Secondary | ICD-10-CM | POA: Diagnosis not present

## 2011-11-22 DIAGNOSIS — Z5189 Encounter for other specified aftercare: Secondary | ICD-10-CM | POA: Diagnosis not present

## 2011-11-22 DIAGNOSIS — E785 Hyperlipidemia, unspecified: Secondary | ICD-10-CM | POA: Diagnosis not present

## 2011-11-22 DIAGNOSIS — E119 Type 2 diabetes mellitus without complications: Secondary | ICD-10-CM | POA: Diagnosis not present

## 2011-11-22 DIAGNOSIS — I252 Old myocardial infarction: Secondary | ICD-10-CM | POA: Diagnosis not present

## 2011-11-22 DIAGNOSIS — I10 Essential (primary) hypertension: Secondary | ICD-10-CM | POA: Diagnosis not present

## 2011-11-27 DIAGNOSIS — E119 Type 2 diabetes mellitus without complications: Secondary | ICD-10-CM | POA: Diagnosis not present

## 2011-11-27 DIAGNOSIS — I2510 Atherosclerotic heart disease of native coronary artery without angina pectoris: Secondary | ICD-10-CM | POA: Diagnosis not present

## 2011-11-27 DIAGNOSIS — I252 Old myocardial infarction: Secondary | ICD-10-CM | POA: Diagnosis not present

## 2011-11-27 DIAGNOSIS — Z9861 Coronary angioplasty status: Secondary | ICD-10-CM | POA: Diagnosis not present

## 2011-11-27 DIAGNOSIS — I10 Essential (primary) hypertension: Secondary | ICD-10-CM | POA: Diagnosis not present

## 2011-11-27 DIAGNOSIS — Z951 Presence of aortocoronary bypass graft: Secondary | ICD-10-CM | POA: Diagnosis not present

## 2011-11-27 DIAGNOSIS — M109 Gout, unspecified: Secondary | ICD-10-CM | POA: Diagnosis not present

## 2011-11-27 DIAGNOSIS — E785 Hyperlipidemia, unspecified: Secondary | ICD-10-CM | POA: Diagnosis not present

## 2011-11-27 DIAGNOSIS — Z5189 Encounter for other specified aftercare: Secondary | ICD-10-CM | POA: Diagnosis not present

## 2011-12-03 ENCOUNTER — Ambulatory Visit (INDEPENDENT_AMBULATORY_CARE_PROVIDER_SITE_OTHER): Payer: Medicare Other | Admitting: Cardiology

## 2011-12-03 ENCOUNTER — Encounter: Payer: Self-pay | Admitting: Cardiology

## 2011-12-03 VITALS — BP 129/81 | HR 56 | Ht 63.0 in | Wt 209.0 lb

## 2011-12-03 DIAGNOSIS — Z951 Presence of aortocoronary bypass graft: Secondary | ICD-10-CM

## 2011-12-03 DIAGNOSIS — E785 Hyperlipidemia, unspecified: Secondary | ICD-10-CM

## 2011-12-03 DIAGNOSIS — E663 Overweight: Secondary | ICD-10-CM | POA: Diagnosis not present

## 2011-12-03 DIAGNOSIS — I10 Essential (primary) hypertension: Secondary | ICD-10-CM

## 2011-12-03 NOTE — Assessment & Plan Note (Signed)
She is concentrating on losing weight.

## 2011-12-03 NOTE — Assessment & Plan Note (Signed)
The blood pressure is at target. No change in medications is indicated. We will continue with therapeutic lifestyle changes (TLC).  

## 2011-12-03 NOTE — Assessment & Plan Note (Signed)
The patient has no new sypmtoms.  No further cardiovascular testing is indicated.  We will continue with aggressive risk reduction and meds as listed.  

## 2011-12-03 NOTE — Progress Notes (Signed)
HPI The patient presents for follow up of CAD.  She is s/p bypass in Jan.  Since we last saw her she has done well. She is participating in cardiac rehab.  The patient denies any new symptoms such as chest discomfort, neck or arm discomfort. There has been no new shortness of breath, PND or orthopnea. There have been no reported palpitations, presyncope or syncope.  No Known Allergies  Current Outpatient Prescriptions  Medication Sig Dispense Refill  . allopurinol (ZYLOPRIM) 100 MG tablet Take 100 mg by mouth daily.        [name] amLODipine (NORVASC) 5 MG tablet Take 1 tablet (5 mg total) by mouth daily.  90 tablet  3  . aspirin 325 MG tablet Take 325 mg by mouth daily.      . B Complex-C (SUPER B COMPLEX PO) Take 1 tablet by mouth daily.        . cholecalciferol (VITAMIN D) 400 UNITS TABS Take 400 Units by mouth daily.      [name] lisinopril (PRINIVIL,ZESTRIL) 40 MG tablet Take 1 tablet (40 mg total) by mouth daily.  90 tablet  3  . metFORMIN (GLUCOPHAGE) 500 MG tablet Take 1,000 mg by mouth 2 (two) times daily with a meal.        . metoprolol tartrate (LOPRESSOR) 25 MG tablet Take 1 tablet (25 mg total) by mouth 2 (two) times daily.  60 tablet  6  . Omega-3 Fatty Acids (FISH OIL) 1000 MG CAPS Take 1 capsule by mouth daily.        . pravastatin (PRAVACHOL) 40 MG tablet Take 1 tablet by mouth Daily.        Past Medical History  Diagnosis Date  . CAD (coronary artery disease)     Non-STEMI/Taxus stenting 100% left anterior descending (2), January 2008. Residual 70% circumflex;80% right coronary artery. Mild LVD (EF 45%);improved to 55-60% by 2-D echocardiogram,January 2009.   [name] Dyslipidemia     takes Pravastatin daily  . Obesity   . Lumbar herniated disc   . Gout     takes Allopurinol daily  . Unspecified essential hypertension     takes Metoprolol,Lisinopril,and Amlodipine  . Myocardial infarction 2008    pt has 2 stents  . Aortic stenosis   . AV block, 1st degree   . Sleep apnea    with exertion-occasionally  . Arthritis   . Back pain     occasionally with weather changes  . Herniated disc     left  . Urinary frequency   . Nocturia   . DM (diabetes mellitus)     takes Metformin 1000mg  BID  . Early cataracts, bilateral     Past Surgical History  Procedure Date  . Back surgery 2009/2010/2011  . Carpal tunnel release     bilateral  . Cholecystectomy not sure  . Breast biopsy unknown    left  . Leg tendon surgery unknown    left   . Abdominal hysterectomy   . Tonsillectomy   . Colonoscopy   . Coronary artery bypass graft 09/05/2011    Procedure: CORONARY ARTERY BYPASS GRAFTING (CABG);  Surgeon: Delight Ovens, MD;  Location: [facility] OR;  Service: Open Heart Surgery;  Laterality: N/A;  CABG times three using left internal mammary artery and left leg greater saphenous vein harvested endoscopically  . Cardiac catheterization 2008    NON-STEMI/TAXUS STENTING 100% PROXIMAL LAD (X2) JANUARY 2008  . Cardiac catheterization 2012    occluded LAD stents, Om1 :  70%, RCA: 80%, Ef 40-45%   ROS:  As stated in the HPI and negative for all other systems.  PHYSICAL EXAM BP 129/81  Pulse 56  Ht 5\' 3"  (1.6 m)  Wt 209 lb (94.802 kg)  BMI 37.02 kg/m2 GENERAL:  Well appearing NECK:  No jugular venous distention, waveform within normal limits, carotid upstroke brisk and symmetric, no bruits, no thyromegaly LYMPHATICS:  No cervical, inguinal adenopathy LUNGS:  Clear to auscultation bilaterally BACK:  No CVA tenderness CHEST:  Well healed sternotomy scar. HEART:  PMI not displaced or sustained,S1 and S2 within normal limits, no S3, no S4, no clicks, no rubs, no murmurs ABD:  Flat, positive bowel sounds normal in frequency in pitch, no bruits, no rebound, no guarding, no midline pulsatile mass, no hepatomegaly, no splenomegaly EXT:  2 plus pulses throughout, no edema, no cyanosis no clubbing   ASSESSMENT AND PLAN

## 2011-12-03 NOTE — Patient Instructions (Signed)
Your physician wants you to follow-up in: 6 months. You will receive a reminder letter in the mail one-two months in advance. If you don't receive a letter, please call our office to schedule the follow-up appointment. Your physician recommends that you continue on your current medications as directed. Please refer to the Current Medication list given to you today. 

## 2011-12-03 NOTE — Assessment & Plan Note (Signed)
Her last LDL was 63. This is at target though her HDL was only 29. We discussed how this might improve with exercise.

## 2011-12-14 ENCOUNTER — Other Ambulatory Visit: Payer: Self-pay | Admitting: *Deleted

## 2011-12-14 MED ORDER — METOPROLOL TARTRATE 25 MG PO TABS: 25.0000 mg | ORAL_TABLET | Freq: Two times a day (BID) | ORAL | Status: DC

## 2011-12-14 NOTE — Telephone Encounter (Signed)
Refilled metoprolol to [facility]

## 2011-12-27 DIAGNOSIS — Z951 Presence of aortocoronary bypass graft: Secondary | ICD-10-CM | POA: Diagnosis not present

## 2011-12-27 DIAGNOSIS — I10 Essential (primary) hypertension: Secondary | ICD-10-CM | POA: Diagnosis not present

## 2011-12-27 DIAGNOSIS — E119 Type 2 diabetes mellitus without complications: Secondary | ICD-10-CM | POA: Diagnosis not present

## 2011-12-27 DIAGNOSIS — E785 Hyperlipidemia, unspecified: Secondary | ICD-10-CM | POA: Diagnosis not present

## 2011-12-27 DIAGNOSIS — Z5189 Encounter for other specified aftercare: Secondary | ICD-10-CM | POA: Diagnosis not present

## 2011-12-27 DIAGNOSIS — Z9861 Coronary angioplasty status: Secondary | ICD-10-CM | POA: Diagnosis not present

## 2011-12-27 DIAGNOSIS — I252 Old myocardial infarction: Secondary | ICD-10-CM | POA: Diagnosis not present

## 2011-12-27 DIAGNOSIS — M109 Gout, unspecified: Secondary | ICD-10-CM | POA: Diagnosis not present

## 2011-12-27 DIAGNOSIS — I2510 Atherosclerotic heart disease of native coronary artery without angina pectoris: Secondary | ICD-10-CM | POA: Diagnosis not present

## 2012-01-09 DIAGNOSIS — E782 Mixed hyperlipidemia: Secondary | ICD-10-CM | POA: Diagnosis not present

## 2012-01-09 DIAGNOSIS — IMO0001 Reserved for inherently not codable concepts without codable children: Secondary | ICD-10-CM | POA: Diagnosis not present

## 2012-01-09 DIAGNOSIS — I10 Essential (primary) hypertension: Secondary | ICD-10-CM | POA: Diagnosis not present

## 2012-01-16 DIAGNOSIS — I248 Other forms of acute ischemic heart disease: Secondary | ICD-10-CM | POA: Diagnosis not present

## 2012-01-16 DIAGNOSIS — I10 Essential (primary) hypertension: Secondary | ICD-10-CM | POA: Diagnosis not present

## 2012-01-16 DIAGNOSIS — M545 Low back pain, unspecified: Secondary | ICD-10-CM | POA: Diagnosis not present

## 2012-01-16 DIAGNOSIS — M109 Gout, unspecified: Secondary | ICD-10-CM | POA: Diagnosis not present

## 2012-01-16 DIAGNOSIS — E782 Mixed hyperlipidemia: Secondary | ICD-10-CM | POA: Diagnosis not present

## 2012-01-16 DIAGNOSIS — IMO0001 Reserved for inherently not codable concepts without codable children: Secondary | ICD-10-CM | POA: Diagnosis not present

## 2012-01-16 DIAGNOSIS — N189 Chronic kidney disease, unspecified: Secondary | ICD-10-CM | POA: Diagnosis not present

## 2012-01-28 DIAGNOSIS — E785 Hyperlipidemia, unspecified: Secondary | ICD-10-CM | POA: Diagnosis not present

## 2012-01-28 DIAGNOSIS — Z951 Presence of aortocoronary bypass graft: Secondary | ICD-10-CM | POA: Diagnosis not present

## 2012-01-28 DIAGNOSIS — I10 Essential (primary) hypertension: Secondary | ICD-10-CM | POA: Diagnosis not present

## 2012-01-28 DIAGNOSIS — I2510 Atherosclerotic heart disease of native coronary artery without angina pectoris: Secondary | ICD-10-CM | POA: Diagnosis not present

## 2012-01-28 DIAGNOSIS — Z5189 Encounter for other specified aftercare: Secondary | ICD-10-CM | POA: Diagnosis not present

## 2012-02-23 IMAGING — CR DG CHEST 2V
2 series · 2 of 2 positions shown · non-contrast
Comparison: Prior examinations ranging from 09/03/2011 through
09/11/2011.

CLINICAL DATA: Follow-up CABG.

CHEST - 2 VIEW

[view not recorded (1 of 2)]
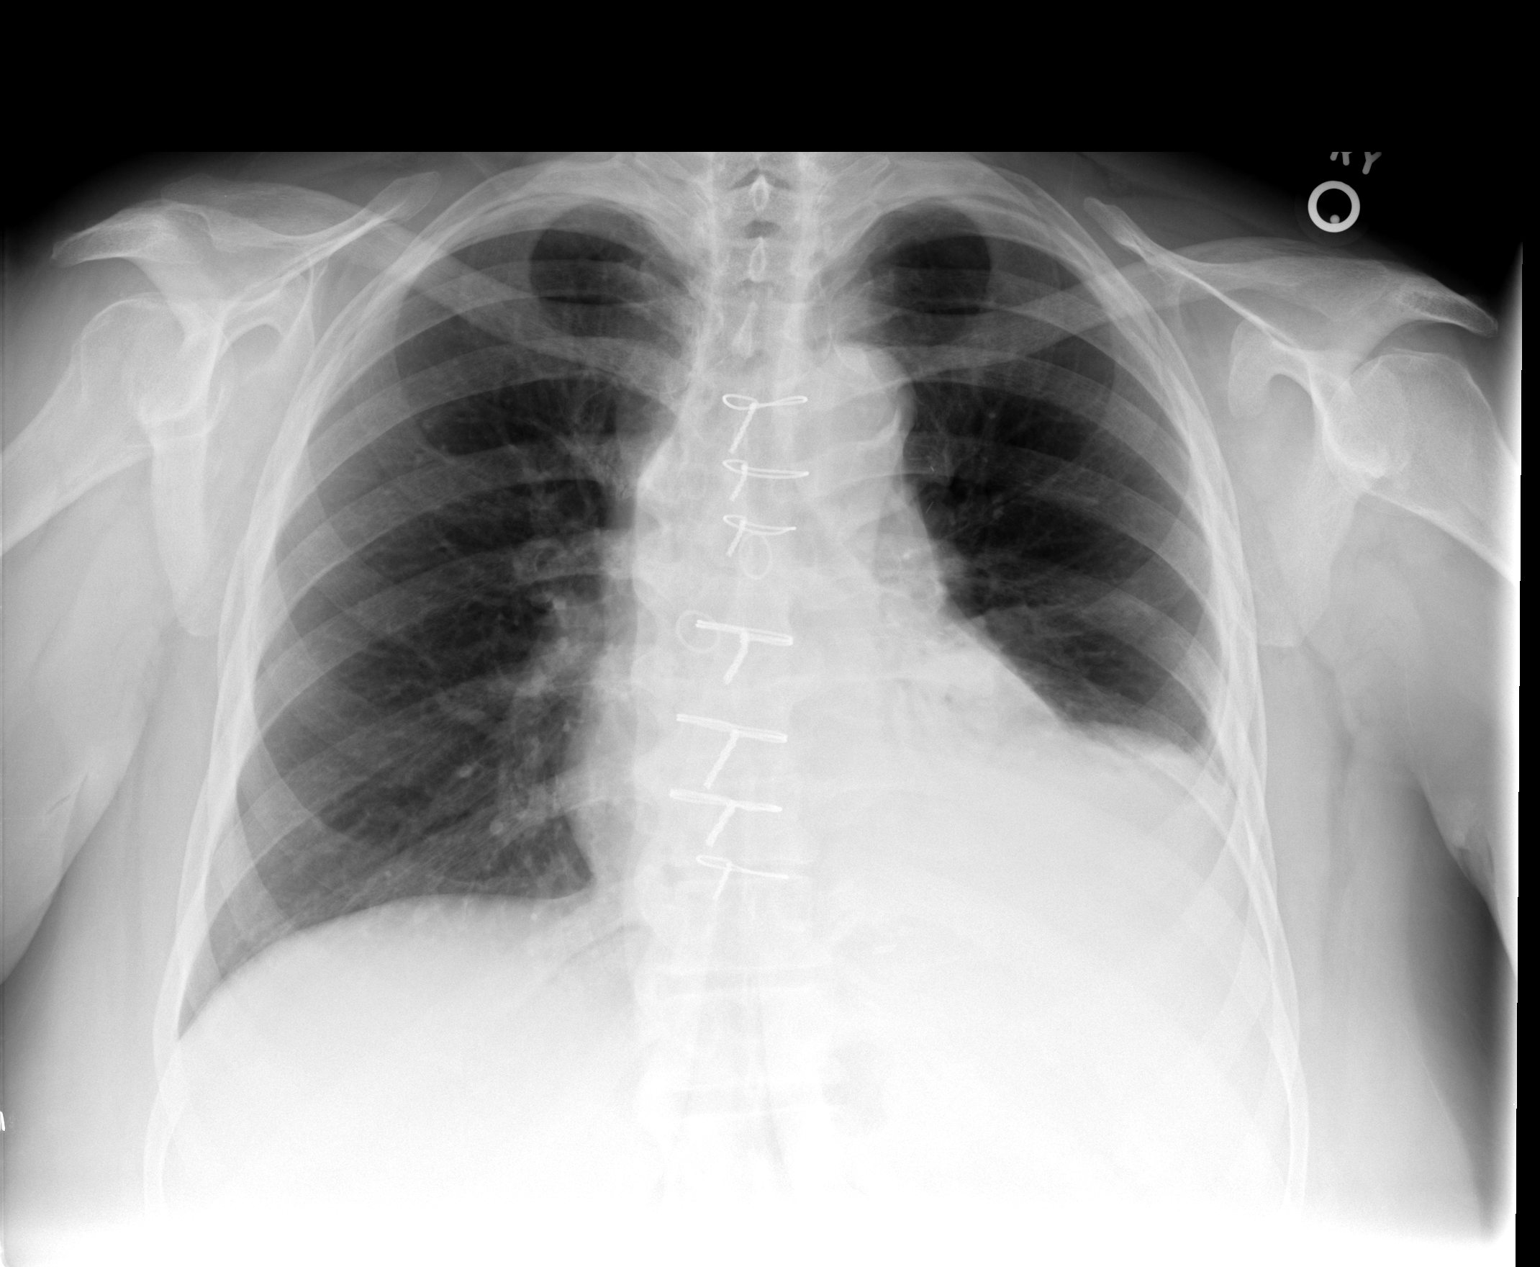

[view not recorded (2 of 2)]
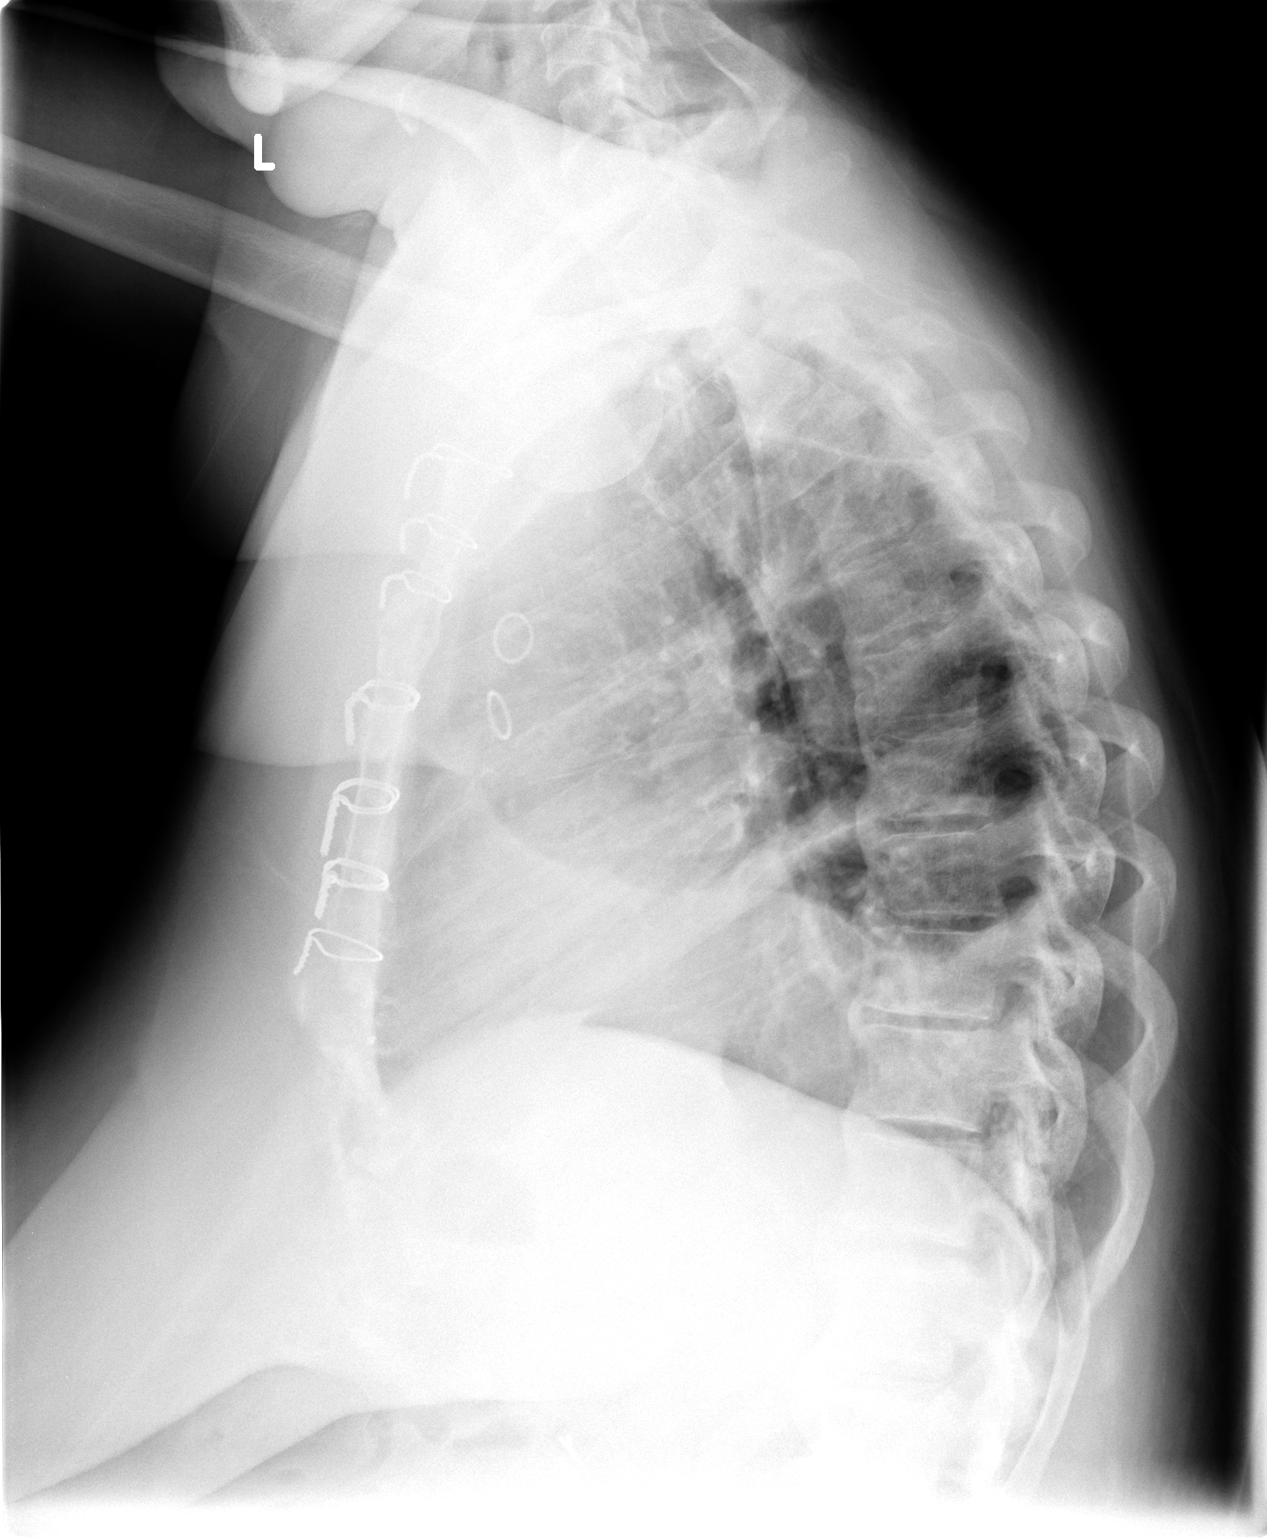

[2 of 2 positions shown; findings below may reference images not displayed]

FINDINGS: The heart size and mediastinal contours are stable status
post CABG.  The right lung is clear.  There is increased left lower
lobe air space disease with associated air bronchograms.  There may
be a small subpulmonic left pleural effusion.  No significant
pleural fluid is seen on the right.  There is no pneumothorax or
edema.
IMPRESSION: Mild worsening of left lower lobe air space disease with possible
associated subpulmonic effusion.  Pneumonia not excluded.

## 2012-03-10 DIAGNOSIS — H0019 Chalazion unspecified eye, unspecified eyelid: Secondary | ICD-10-CM | POA: Diagnosis not present

## 2012-04-16 DIAGNOSIS — E782 Mixed hyperlipidemia: Secondary | ICD-10-CM | POA: Diagnosis not present

## 2012-04-16 DIAGNOSIS — M109 Gout, unspecified: Secondary | ICD-10-CM | POA: Diagnosis not present

## 2012-04-16 DIAGNOSIS — I10 Essential (primary) hypertension: Secondary | ICD-10-CM | POA: Diagnosis not present

## 2012-04-16 DIAGNOSIS — IMO0001 Reserved for inherently not codable concepts without codable children: Secondary | ICD-10-CM | POA: Diagnosis not present

## 2012-04-22 DIAGNOSIS — N390 Urinary tract infection, site not specified: Secondary | ICD-10-CM | POA: Diagnosis not present

## 2012-04-22 DIAGNOSIS — I248 Other forms of acute ischemic heart disease: Secondary | ICD-10-CM | POA: Diagnosis not present

## 2012-04-22 DIAGNOSIS — E119 Type 2 diabetes mellitus without complications: Secondary | ICD-10-CM | POA: Diagnosis not present

## 2012-04-22 DIAGNOSIS — N189 Chronic kidney disease, unspecified: Secondary | ICD-10-CM | POA: Diagnosis not present

## 2012-04-22 DIAGNOSIS — I10 Essential (primary) hypertension: Secondary | ICD-10-CM | POA: Diagnosis not present

## 2012-04-22 DIAGNOSIS — E782 Mixed hyperlipidemia: Secondary | ICD-10-CM | POA: Diagnosis not present

## 2012-04-22 DIAGNOSIS — M109 Gout, unspecified: Secondary | ICD-10-CM | POA: Diagnosis not present

## 2012-04-22 DIAGNOSIS — N3000 Acute cystitis without hematuria: Secondary | ICD-10-CM | POA: Diagnosis not present

## 2012-05-13 ENCOUNTER — Other Ambulatory Visit: Payer: Self-pay | Admitting: Cardiology

## 2012-05-13 MED ORDER — METOPROLOL TARTRATE 25 MG PO TABS: 25.0000 mg | ORAL_TABLET | Freq: Two times a day (BID) | ORAL | Status: DC

## 2012-05-21 DIAGNOSIS — Z23 Encounter for immunization: Secondary | ICD-10-CM | POA: Diagnosis not present

## 2012-07-02 ENCOUNTER — Other Ambulatory Visit: Payer: Self-pay | Admitting: Cardiology

## 2012-07-03 ENCOUNTER — Other Ambulatory Visit: Payer: Self-pay | Admitting: Cardiology

## 2012-07-03 ENCOUNTER — Other Ambulatory Visit: Payer: Self-pay

## 2012-07-03 MED ORDER — METOPROLOL TARTRATE 25 MG PO TABS: 25.0000 mg | ORAL_TABLET | Freq: Every day | ORAL | Status: DC

## 2012-07-03 NOTE — Telephone Encounter (Signed)
..   Requested Prescriptions   Signed Prescriptions Disp Refills  . metoprolol tartrate (LOPRESSOR) 25 MG tablet 60 tablet 6    Sig: Take 1 tablet (25 mg total) by mouth daily.    Authorizing Provider: Rollene Rotunda    Ordering User: Travor Royce M  redid did not send it in right

## 2012-07-03 NOTE — Telephone Encounter (Signed)
..   Requested Prescriptions   Pending Prescriptions Disp Refills  . metoprolol tartrate (LOPRESSOR) 25 MG tablet [Pharmacy Med Name: METOPROL TAR 25MG  (TAB)] 60 tablet 6    Sig: TAKE 1 TABLET BY MOUTH 2 TIMES DAILY.

## 2012-07-03 NOTE — Telephone Encounter (Signed)
..   Requested Prescriptions   Pending Prescriptions Disp Refills  . metoprolol tartrate (LOPRESSOR) 25 MG tablet [Pharmacy Med Name: METOPROL TAR 25MG  (TAB)] 60 tablet 0    Sig: TAKE 1 TABLET BY MOUTH 2 TIMES DAILY.

## 2012-07-07 ENCOUNTER — Ambulatory Visit (INDEPENDENT_AMBULATORY_CARE_PROVIDER_SITE_OTHER): Payer: Medicare Other | Admitting: Cardiology

## 2012-07-07 ENCOUNTER — Encounter: Payer: Self-pay | Admitting: Cardiology

## 2012-07-07 VITALS — BP 119/78 | HR 87 | Ht 63.0 in | Wt 213.0 lb

## 2012-07-07 DIAGNOSIS — E785 Hyperlipidemia, unspecified: Secondary | ICD-10-CM

## 2012-07-07 DIAGNOSIS — I259 Chronic ischemic heart disease, unspecified: Secondary | ICD-10-CM

## 2012-07-07 DIAGNOSIS — E119 Type 2 diabetes mellitus without complications: Secondary | ICD-10-CM | POA: Diagnosis not present

## 2012-07-07 DIAGNOSIS — Z951 Presence of aortocoronary bypass graft: Secondary | ICD-10-CM

## 2012-07-07 DIAGNOSIS — E663 Overweight: Secondary | ICD-10-CM

## 2012-07-07 DIAGNOSIS — I10 Essential (primary) hypertension: Secondary | ICD-10-CM

## 2012-07-07 MED ORDER — METOPROLOL TARTRATE 25 MG PO TABS: 25.0000 mg | ORAL_TABLET | Freq: Two times a day (BID) | ORAL | Status: DC

## 2012-07-07 NOTE — Patient Instructions (Addendum)

## 2012-07-07 NOTE — Progress Notes (Signed)
HPI The patient presents for follow up of CAD.  She is s/p bypass in Jan.  Since we last saw her she has done well. She  participated in cardiac rehab.  The patient denies any new symptoms such as chest discomfort, neck or arm discomfort. There has been no new shortness of breath, PND or orthopnea. There have been no reported palpitations, presyncope or syncope.  She has had a sharp pain or significant fatigue that she had previously.  No Known Allergies  Current Outpatient Prescriptions  Medication Sig Dispense Refill  . allopurinol (ZYLOPRIM) 100 MG tablet Take 100 mg by mouth daily.        [name] amLODipine (NORVASC) 5 MG tablet Take 1 tablet (5 mg total) by mouth daily.  90 tablet  3  . aspirin 325 MG tablet Take 325 mg by mouth daily.      . B Complex-C (SUPER B COMPLEX PO) Take 1 tablet by mouth daily.        . cholecalciferol (VITAMIN D) 400 UNITS TABS Take 400 Units by mouth daily.      [name] COLCRYS 0.6 MG tablet Take 0.6 mg by mouth as needed.       [name] lisinopril (PRINIVIL,ZESTRIL) 40 MG tablet Take 1 tablet (40 mg total) by mouth daily.  90 tablet  3  . metFORMIN (GLUCOPHAGE) 500 MG tablet Take 1,000 mg by mouth 2 (two) times daily with a meal.        . metoprolol tartrate (LOPRESSOR) 25 MG tablet TAKE 1 TABLET BY MOUTH 2 TIMES DAILY.  60 tablet  0  . pravastatin (PRAVACHOL) 40 MG tablet Take 1 tablet by mouth Daily.      . [DISCONTINUED] metoprolol tartrate (LOPRESSOR) 25 MG tablet Take 1 tablet (25 mg total) by mouth daily.  60 tablet  6    Past Medical History  Diagnosis Date  . CAD (coronary artery disease)     Non-STEMI/Taxus stenting 100% left anterior descending (2), January 2008. Residual 70% circumflex;80% right coronary artery. Mild LVD (EF 45%);improved to 55-60% by 2-D echocardiogram,January 2009.   [name] Dyslipidemia     takes Pravastatin daily  . Obesity   . Lumbar herniated disc   . Gout     takes Allopurinol daily  . Unspecified essential hypertension     takes  Metoprolol,Lisinopril,and Amlodipine  . Myocardial infarction 2008    pt has 2 stents  . Aortic stenosis   . AV block, 1st degree   . Sleep apnea     with exertion-occasionally  . Arthritis   . Back pain     occasionally with weather changes  . Herniated disc     left  . Urinary frequency   . Nocturia   . DM (diabetes mellitus)     takes Metformin 1000mg  BID  . Early cataracts, bilateral     Past Surgical History  Procedure Date  . Back surgery 2009/2010/2011  . Carpal tunnel release     bilateral  . Cholecystectomy not sure  . Breast biopsy unknown    left  . Leg tendon surgery unknown    left   . Abdominal hysterectomy   . Tonsillectomy   . Colonoscopy   . Coronary artery bypass graft 09/05/2011    Procedure: CORONARY ARTERY BYPASS GRAFTING (CABG);  Surgeon: Delight Ovens, MD;  Location: [facility] OR;  Service: Open Heart Surgery;  Laterality: N/A;  CABG times three using left internal mammary artery and left leg greater saphenous  vein harvested endoscopically  . Cardiac catheterization 2008    NON-STEMI/TAXUS STENTING 100% PROXIMAL LAD (X2) JANUARY 2008  . Cardiac catheterization 2012    occluded LAD stents, Om1 : 70%, RCA: 80%, Ef 40-45%   ROS:  As stated in the HPI and negative for all other systems.  PHYSICAL EXAM BP 119/78  Pulse 87  Ht 5\' 3"  (1.6 m)  Wt 213 lb (96.616 kg)  BMI 37.73 kg/m2  SpO2 97% GENERAL:  Well appearing NECK:  No jugular venous distention, waveform within normal limits, carotid upstroke brisk and symmetric, no bruits, no thyromegaly LYMPHATICS:  No cervical, inguinal adenopathy LUNGS:  Clear to auscultation bilaterally BACK:  No CVA tenderness CHEST:  Well healed sternotomy scar. HEART:  PMI not displaced or sustained,S1 and S2 within normal limits, no S3, no S4, no clicks, no rubs, no murmurs ABD:  Flat, positive bowel sounds normal in frequency in pitch, no bruits, no rebound, no guarding, no midline pulsatile mass, no hepatomegaly, no  splenomegaly EXT:  2 plus pulses throughout, no edema, no cyanosis no clubbing   ASSESSMENT AND PLAN  S/P CABG (coronary artery bypass graft) -  The patient has no new sypmtoms. No further cardiovascular testing is indicated. We will continue with aggressive risk reduction and meds as listed.  She completed rehabilitation and is given permission to start exercising at the [facility].  DYSLIPIDEMIA - She was at target in Feb and is due to have this checked when she sees her primary provider.  Unspecified essential hypertension -  The blood pressure is at target. No change in medications is indicated. We will continue with therapeutic lifestyle changes (TLC).   Overweight -  She understands the need to lose weight with diet and exercise.

## 2012-08-15 DIAGNOSIS — Z1231 Encounter for screening mammogram for malignant neoplasm of breast: Secondary | ICD-10-CM | POA: Diagnosis not present

## 2012-09-25 DIAGNOSIS — E119 Type 2 diabetes mellitus without complications: Secondary | ICD-10-CM | POA: Diagnosis not present

## 2012-09-25 DIAGNOSIS — I10 Essential (primary) hypertension: Secondary | ICD-10-CM | POA: Diagnosis not present

## 2012-09-25 DIAGNOSIS — E782 Mixed hyperlipidemia: Secondary | ICD-10-CM | POA: Diagnosis not present

## 2012-10-01 DIAGNOSIS — E119 Type 2 diabetes mellitus without complications: Secondary | ICD-10-CM | POA: Diagnosis not present

## 2012-10-01 DIAGNOSIS — E782 Mixed hyperlipidemia: Secondary | ICD-10-CM | POA: Diagnosis not present

## 2012-10-01 DIAGNOSIS — M109 Gout, unspecified: Secondary | ICD-10-CM | POA: Diagnosis not present

## 2012-10-01 DIAGNOSIS — Z0000 Encounter for general adult medical examination without abnormal findings: Secondary | ICD-10-CM | POA: Diagnosis not present

## 2012-10-01 DIAGNOSIS — N189 Chronic kidney disease, unspecified: Secondary | ICD-10-CM | POA: Diagnosis not present

## 2012-10-01 DIAGNOSIS — I10 Essential (primary) hypertension: Secondary | ICD-10-CM | POA: Diagnosis not present

## 2012-10-01 DIAGNOSIS — I248 Other forms of acute ischemic heart disease: Secondary | ICD-10-CM | POA: Diagnosis not present

## 2013-03-30 DIAGNOSIS — E782 Mixed hyperlipidemia: Secondary | ICD-10-CM | POA: Diagnosis not present

## 2013-03-30 DIAGNOSIS — I10 Essential (primary) hypertension: Secondary | ICD-10-CM | POA: Diagnosis not present

## 2013-03-30 DIAGNOSIS — IMO0001 Reserved for inherently not codable concepts without codable children: Secondary | ICD-10-CM | POA: Diagnosis not present

## 2013-04-02 DIAGNOSIS — M109 Gout, unspecified: Secondary | ICD-10-CM | POA: Diagnosis not present

## 2013-04-02 DIAGNOSIS — E782 Mixed hyperlipidemia: Secondary | ICD-10-CM | POA: Diagnosis not present

## 2013-04-02 DIAGNOSIS — I248 Other forms of acute ischemic heart disease: Secondary | ICD-10-CM | POA: Diagnosis not present

## 2013-04-02 DIAGNOSIS — E119 Type 2 diabetes mellitus without complications: Secondary | ICD-10-CM | POA: Diagnosis not present

## 2013-04-02 DIAGNOSIS — I10 Essential (primary) hypertension: Secondary | ICD-10-CM | POA: Diagnosis not present

## 2013-04-02 DIAGNOSIS — N189 Chronic kidney disease, unspecified: Secondary | ICD-10-CM | POA: Diagnosis not present

## 2013-05-28 DIAGNOSIS — Z23 Encounter for immunization: Secondary | ICD-10-CM | POA: Diagnosis not present

## 2013-07-07 ENCOUNTER — Other Ambulatory Visit: Payer: Self-pay | Admitting: *Deleted

## 2013-07-07 MED ORDER — METOPROLOL TARTRATE 25 MG PO TABS: 25.0000 mg | ORAL_TABLET | Freq: Two times a day (BID) | ORAL | Status: DC

## 2013-07-14 ENCOUNTER — Ambulatory Visit (INDEPENDENT_AMBULATORY_CARE_PROVIDER_SITE_OTHER): Payer: Medicare Other | Admitting: Cardiology

## 2013-07-14 ENCOUNTER — Encounter: Payer: Self-pay | Admitting: Cardiology

## 2013-07-14 VITALS — BP 128/84 | HR 72 | Ht 63.0 in | Wt 218.0 lb

## 2013-07-14 DIAGNOSIS — I10 Essential (primary) hypertension: Secondary | ICD-10-CM | POA: Diagnosis not present

## 2013-07-14 DIAGNOSIS — E785 Hyperlipidemia, unspecified: Secondary | ICD-10-CM

## 2013-07-14 DIAGNOSIS — I259 Chronic ischemic heart disease, unspecified: Secondary | ICD-10-CM

## 2013-07-14 MED ORDER — NITROGLYCERIN 0.4 MG SL SUBL: 0.4000 mg | SUBLINGUAL_TABLET | SUBLINGUAL | Status: DC | PRN

## 2013-07-14 NOTE — Progress Notes (Signed)
Clinical Summary [name] is a 67 y.o.female last seen by Dr Antoine Poche, this is our first visit. She was seen for the following medical problems.   1. CAD - CABG Jan 2013: 3 vessel (LIMA to LAD, SVG to OM, SVG to acute marginal).She had prior stenting as described below - Jan 2013 LVEF 55-60%  - reports occasional chest discomfort. Mild aching pain 5/10, midchest at incision site. No other associated symptoms. Denies any SOB or DOE. Fairly sedentary lifestyle, denies any symptoms with her routine activities.  - compliant with meds: ASA 325, lisinopril, metoprolol, pravastatin.   2. Hyperlipidemia - on pravastatin, compliant - 09/2011 TC 119 HDL 29 LDL 63 TG 137 - reports was on lipitor but was changed, she is not sure why  3. HTN - does not check at home - compliant with meds   Past Medical History  Diagnosis Date  . CAD (coronary artery disease)     Non-STEMI/Taxus stenting 100% left anterior descending (2), January 2008. Residual 70% circumflex;80% right coronary artery. Mild LVD (EF 45%);improved to 55-60% by 2-D echocardiogram,January 2009.   [name] Dyslipidemia     takes Pravastatin daily  . Obesity   . Lumbar herniated disc   . Gout     takes Allopurinol daily  . Unspecified essential hypertension     takes Metoprolol,Lisinopril,and Amlodipine  . Myocardial infarction 2008    pt has 2 stents  . Aortic stenosis   . AV block, 1st degree   . Arthritis   . Back pain     occasionally with weather changes  . Herniated disc     left  . Urinary frequency   . Nocturia   . DM (diabetes mellitus)     takes Metformin 1000mg  BID  . Early cataracts, bilateral      No Known Allergies   Current Outpatient Prescriptions  Medication Sig Dispense Refill  . allopurinol (ZYLOPRIM) 100 MG tablet Take 100 mg by mouth daily.        [name] amLODipine (NORVASC) 5 MG tablet Take 1 tablet (5 mg total) by mouth daily.  90 tablet  3  . aspirin 325 MG tablet Take 325 mg by mouth daily.       . B Complex-C (SUPER B COMPLEX PO) Take 1 tablet by mouth daily.        . cholecalciferol (VITAMIN D) 400 UNITS TABS Take 400 Units by mouth daily.      [name] COLCRYS 0.6 MG tablet Take 0.6 mg by mouth as needed.       [name] lisinopril (PRINIVIL,ZESTRIL) 40 MG tablet Take 1 tablet (40 mg total) by mouth daily.  90 tablet  3  . metFORMIN (GLUCOPHAGE) 500 MG tablet Take 1,000 mg by mouth 2 (two) times daily with a meal.        . metoprolol tartrate (LOPRESSOR) 25 MG tablet Take 1 tablet (25 mg total) by mouth 2 (two) times daily.  180 tablet  3  . pravastatin (PRAVACHOL) 40 MG tablet Take 1 tablet by mouth Daily.       No current facility-administered medications for this visit.     Past Surgical History  Procedure Laterality Date  . Back surgery  2009/2010/2011  . Carpal tunnel release      bilateral  . Cholecystectomy  not sure  . Breast biopsy  unknown    left  . Leg tendon surgery  unknown    left   . Abdominal hysterectomy    .  Tonsillectomy    . Colonoscopy    . Coronary artery bypass graft  09/05/2011    Procedure: CORONARY ARTERY BYPASS GRAFTING (CABG);  Surgeon: Delight Ovens, MD;  Location: [facility] OR;  Service: Open Heart Surgery;  Laterality: N/A;  CABG times three using left internal mammary artery and left leg greater saphenous vein harvested endoscopically  . Cardiac catheterization  2008    NON-STEMI/TAXUS STENTING 100% PROXIMAL LAD (X2) JANUARY 2008  . Cardiac catheterization  2012    occluded LAD stents, Om1 : 70%, RCA: 80%, Ef 40-45%     No Known Allergies    Family History  Problem Relation Age of Onset  . Heart attack Sister 47  . Heart attack Brother 40  . Anesthesia problems Neg Hx   . Hypotension Neg Hx   . Malignant hyperthermia Neg Hx   . Pseudochol deficiency Neg Hx      Social History Ms. Gilardi reports that she has never smoked. She has never used smokeless tobacco. Ms. Paske reports that she does not drink alcohol.   Review of  Systems CONSTITUTIONAL: No weight loss, fever, chills, weakness or fatigue.  HEENT: Eyes: No visual loss, blurred vision, double vision or yellow sclerae.No hearing loss, sneezing, congestion, runny nose or sore throat.  SKIN: No rash or itching.  CARDIOVASCULAR: per HPI RESPIRATORY: per HPI  GASTROINTESTINAL: No anorexia, nausea, vomiting or diarrhea. No abdominal pain or blood.  GENITOURINARY: No burning on urination, no polyuria NEUROLOGICAL: No headache, dizziness, syncope, paralysis, ataxia, numbness or tingling in the extremities. No change in bowel or bladder control.  MUSCULOSKELETAL: No muscle, back pain, joint pain or stiffness.  LYMPHATICS: No enlarged nodes. No history of splenectomy.  PSYCHIATRIC: No history of depression or anxiety.  ENDOCRINOLOGIC: No reports of sweating, cold or heat intolerance. No polyuria or polydipsia.  [name]   Physical Examination p 72 bp 128/84 Wt 218 lbs BMI 39 Gen: resting comfortably, no acute distress HEENT: no scleral icterus, pupils equal round and reactive, no palptable cervical adenopathy,  CV: RRR, no m/r/g, no JVD, no carotid bruits Resp: Clear to auscultation bilaterally GI: abdomen is soft, non-tender, non-distended, normal bowel sounds, no hepatosplenomegaly MSK: extremities are warm, no edema.  Skin: warm, no rash Neuro:  no focal deficits Psych: appropriate affect   Diagnostic Studies Jan 2013 LVEF 55-60%, diastolic dysfunction,    07/14/13 Clinic EKG: sinus rhythm, anteroseptal Q waves.  Assessment and Plan  1. CAD - status post CABG, denies any current symptoms - continue secondary prevention and risk factor modification, change ASA to 81mg  daily  2. HTN - at goal, continue current meds  3. Hyperlipidemia - continue pravastatin, reports being switched from lipitor in the past, unclear if she had reaction to more potent statin.    F/u 6 months   Antoine Poche, M.D., F.A.C.C.

## 2013-07-14 NOTE — Patient Instructions (Signed)
Your physician recommends that you schedule a follow-up appointment in: 6 month with Dr. Wyline Mood. You should receive a letter in the mail in 4 months. If you do not receive this letter by March 2015 call our office to schedule this appointment.   Your physician has recommended you make the following change in your medication:  Decrease Aspirin to 81 MG once daily Start Nitro 0.4 MG under tongue every 5 minutes as needed for chest pain do not exceeded 3 doses. If you get to the third dose and it does not help with chest pain call 911 or go to ER.  Continue all other medications the same.

## 2013-08-17 DIAGNOSIS — Z1231 Encounter for screening mammogram for malignant neoplasm of breast: Secondary | ICD-10-CM | POA: Diagnosis not present

## 2013-10-01 DIAGNOSIS — N3000 Acute cystitis without hematuria: Secondary | ICD-10-CM | POA: Diagnosis not present

## 2013-10-01 DIAGNOSIS — I10 Essential (primary) hypertension: Secondary | ICD-10-CM | POA: Diagnosis not present

## 2013-10-01 DIAGNOSIS — IMO0001 Reserved for inherently not codable concepts without codable children: Secondary | ICD-10-CM | POA: Diagnosis not present

## 2013-10-01 DIAGNOSIS — E119 Type 2 diabetes mellitus without complications: Secondary | ICD-10-CM | POA: Diagnosis not present

## 2013-10-01 DIAGNOSIS — E782 Mixed hyperlipidemia: Secondary | ICD-10-CM | POA: Diagnosis not present

## 2013-10-01 DIAGNOSIS — E559 Vitamin D deficiency, unspecified: Secondary | ICD-10-CM | POA: Diagnosis not present

## 2013-10-01 DIAGNOSIS — N189 Chronic kidney disease, unspecified: Secondary | ICD-10-CM | POA: Diagnosis not present

## 2013-10-08 DIAGNOSIS — Z0000 Encounter for general adult medical examination without abnormal findings: Secondary | ICD-10-CM | POA: Diagnosis not present

## 2013-10-08 DIAGNOSIS — Z23 Encounter for immunization: Secondary | ICD-10-CM | POA: Diagnosis not present

## 2013-10-08 DIAGNOSIS — E782 Mixed hyperlipidemia: Secondary | ICD-10-CM | POA: Diagnosis not present

## 2013-10-08 DIAGNOSIS — I248 Other forms of acute ischemic heart disease: Secondary | ICD-10-CM | POA: Diagnosis not present

## 2013-10-08 DIAGNOSIS — M109 Gout, unspecified: Secondary | ICD-10-CM | POA: Diagnosis not present

## 2013-10-08 DIAGNOSIS — N189 Chronic kidney disease, unspecified: Secondary | ICD-10-CM | POA: Diagnosis not present

## 2013-10-08 DIAGNOSIS — I10 Essential (primary) hypertension: Secondary | ICD-10-CM | POA: Diagnosis not present

## 2013-10-08 DIAGNOSIS — E119 Type 2 diabetes mellitus without complications: Secondary | ICD-10-CM | POA: Diagnosis not present

## 2014-01-11 DIAGNOSIS — M810 Age-related osteoporosis without current pathological fracture: Secondary | ICD-10-CM | POA: Diagnosis not present

## 2014-01-11 DIAGNOSIS — Z7982 Long term (current) use of aspirin: Secondary | ICD-10-CM | POA: Diagnosis not present

## 2014-01-11 DIAGNOSIS — Z79899 Other long term (current) drug therapy: Secondary | ICD-10-CM | POA: Diagnosis not present

## 2014-01-11 DIAGNOSIS — Z13820 Encounter for screening for osteoporosis: Secondary | ICD-10-CM | POA: Diagnosis not present

## 2014-01-11 DIAGNOSIS — Z780 Asymptomatic menopausal state: Secondary | ICD-10-CM | POA: Diagnosis not present

## 2014-01-15 ENCOUNTER — Encounter: Payer: Self-pay | Admitting: Cardiology

## 2014-01-15 ENCOUNTER — Ambulatory Visit (INDEPENDENT_AMBULATORY_CARE_PROVIDER_SITE_OTHER): Payer: Medicare Other | Admitting: Cardiology

## 2014-01-15 VITALS — BP 137/81 | HR 73 | Ht 63.0 in | Wt 217.0 lb

## 2014-01-15 DIAGNOSIS — I2510 Atherosclerotic heart disease of native coronary artery without angina pectoris: Secondary | ICD-10-CM | POA: Diagnosis not present

## 2014-01-15 DIAGNOSIS — E785 Hyperlipidemia, unspecified: Secondary | ICD-10-CM

## 2014-01-15 DIAGNOSIS — I10 Essential (primary) hypertension: Secondary | ICD-10-CM | POA: Diagnosis not present

## 2014-01-15 NOTE — Patient Instructions (Signed)
Continue all current medications. Your physician wants you to follow up in:  1 year.  You will receive a reminder letter in the mail one-two months in advance.  If you don't receive a letter, please call our office to schedule the follow up appointment   

## 2014-01-15 NOTE — Progress Notes (Signed)
Clinical Summary Ms. Labreck is a 68 y.o.female seen today for follow up of the following medical problems.   1. CAD  - CABG Jan 2013: 3 vessel (LIMA to LAD, SVG to OM, SVG to acute marginal).She had prior stenting as described below  - Jan 2013 echo LVEF 55-60%  - reports occasional chest discomfort. Mild aching pain 5/10, midchest at incision site. No other associated symptoms. Denies any SOB or DOE. Fairly sedentary lifestyle, denies any symptoms with her routine activities.  - compliant with meds  2. Hyperlipidemia  - on pravastatin, compliant  - 09/2011 TC 119 HDL 29 LDL 63 TG 137  - reports was on lipitor but was changed, she is not sure why  - reports repeat panel pending for August  3. HTN  - does not check at home  - compliant with meds  Past Medical History  Diagnosis Date  . CAD (coronary artery disease)     Non-STEMI/Taxus stenting 100% left anterior descending (2), January 2008. Residual 70% circumflex;80% right coronary artery. Mild LVD (EF 45%);improved to 55-60% by 2-D echocardiogram,January 2009.   [name] Dyslipidemia     takes Pravastatin daily  . Obesity   . Lumbar herniated disc   . Gout     takes Allopurinol daily  . Unspecified essential hypertension     takes Metoprolol,Lisinopril,and Amlodipine  . Myocardial infarction 2008    pt has 2 stents  . Aortic stenosis   . AV block, 1st degree   . Arthritis   . Back pain     occasionally with weather changes  . Herniated disc     left  . Urinary frequency   . Nocturia   . DM (diabetes mellitus)     takes Metformin 1000mg  BID  . Early cataracts, bilateral      No Known Allergies   Current Outpatient Prescriptions  Medication Sig Dispense Refill  . allopurinol (ZYLOPRIM) 100 MG tablet Take 100 mg by mouth daily.        [name] amLODipine (NORVASC) 5 MG tablet Take 1 tablet (5 mg total) by mouth daily.  90 tablet  3  . aspirin 81 MG tablet Take 81 mg by mouth daily.      [name] COLCRYS 0.6 MG tablet Take 0.6  mg by mouth as needed.       [name] lisinopril (PRINIVIL,ZESTRIL) 40 MG tablet Take 1 tablet (40 mg total) by mouth daily.  90 tablet  3  . metFORMIN (GLUCOPHAGE) 500 MG tablet Take 1,000 mg by mouth 2 (two) times daily with a meal.        . metoprolol tartrate (LOPRESSOR) 25 MG tablet Take 1 tablet (25 mg total) by mouth 2 (two) times daily.  180 tablet  3  . Multiple Vitamin (MULTIVITAMIN) tablet Take 1 tablet by mouth daily.      . nitroGLYCERIN (NITROSTAT) 0.4 MG SL tablet Place 1 tablet (0.4 mg total) under the tongue every 5 (five) minutes as needed for chest pain.  25 tablet  3  . pravastatin (PRAVACHOL) 40 MG tablet Take 1 tablet by mouth Daily.       No current facility-administered medications for this visit.     Past Surgical History  Procedure Laterality Date  . Back surgery  2009/2010/2011  . Carpal tunnel release      bilateral  . Cholecystectomy  not sure  . Breast biopsy  unknown    left  . Leg tendon surgery  unknown  left   . Abdominal hysterectomy    . Tonsillectomy    . Colonoscopy    . Coronary artery bypass graft  09/05/2011    Procedure: CORONARY ARTERY BYPASS GRAFTING (CABG);  Surgeon: Grace Isaac, MD;  Location: [place];  Service: Open Heart Surgery;  Laterality: N/A;  CABG times three using left internal mammary artery and left leg greater saphenous vein harvested endoscopically  . Cardiac catheterization  2008    NON-STEMI/TAXUS STENTING 100% PROXIMAL LAD (X2) JANUARY 2008  . Cardiac catheterization  2012    occluded LAD stents, Om1 : 70%, RCA: 80%, Ef 40-45%     No Known Allergies    Family History  Problem Relation Age of Onset  . Heart attack Sister 19  . Heart attack Brother 64  . Anesthesia problems Neg Hx   . Hypotension Neg Hx   . Malignant hyperthermia Neg Hx   . Pseudochol deficiency Neg Hx      Social History Ms. Masci reports that she has never smoked. She has never used smokeless tobacco. Ms. Mooneyham reports that she does not  drink alcohol.   Review of Systems CONSTITUTIONAL: No weight loss, fever, chills, weakness or fatigue.  HEENT: Eyes: No visual loss, blurred vision, double vision or yellow sclerae.No hearing loss, sneezing, congestion, runny nose or sore throat.  SKIN: No rash or itching.  CARDIOVASCULAR: per HPI RESPIRATORY: No shortness of breath, cough or sputum.  GASTROINTESTINAL: No anorexia, nausea, vomiting or diarrhea. No abdominal pain or blood.  GENITOURINARY: No burning on urination, no polyuria NEUROLOGICAL: No headache, dizziness, syncope, paralysis, ataxia, numbness or tingling in the extremities. No change in bowel or bladder control.  MUSCULOSKELETAL: No muscle, back pain, joint pain or stiffness.  LYMPHATICS: No enlarged nodes. No history of splenectomy.  PSYCHIATRIC: No history of depression or anxiety.  ENDOCRINOLOGIC: No reports of sweating, cold or heat intolerance. No polyuria or polydipsia.  [name]   Physical Examination p 73 bp 128/70 Wt 217 lbs BMI 38 Gen: resting comfortably, no acute distress HEENT: no scleral icterus, pupils equal round and reactive, no palptable cervical adenopathy,  CV: RRR, no m/r/g, no JVD, no carotid bruits Resp: Clear to auscultation bilaterally GI: abdomen is soft, non-tender, non-distended, normal bowel sounds, no hepatosplenomegaly MSK: extremities are warm, no edema.  Skin: warm, no rash Neuro:  no focal deficits Psych: appropriate affect   Diagnostic Studies Jan 2013  LVEF 93-81%, diastolic dysfunction,        Assessment and Plan  1. CAD  - status post CABG, denies any current symptoms  - continue secondary prevention and risk factor modification  2. HTN  - at goal, continue current meds   3. Hyperlipidemia  - continue pravastatin, reports being switched from lipitor in the past, unclear if she had reaction to more potent statin.  - follow up repeat panel pending in August with her pcp   F/u 1 year      Arnoldo Lenis,  M.D., F.A.C.C.

## 2014-02-09 DIAGNOSIS — R300 Dysuria: Secondary | ICD-10-CM | POA: Diagnosis not present

## 2014-02-09 DIAGNOSIS — L02219 Cutaneous abscess of trunk, unspecified: Secondary | ICD-10-CM | POA: Diagnosis not present

## 2014-03-31 DIAGNOSIS — E782 Mixed hyperlipidemia: Secondary | ICD-10-CM | POA: Diagnosis not present

## 2014-03-31 DIAGNOSIS — IMO0001 Reserved for inherently not codable concepts without codable children: Secondary | ICD-10-CM | POA: Diagnosis not present

## 2014-03-31 DIAGNOSIS — I10 Essential (primary) hypertension: Secondary | ICD-10-CM | POA: Diagnosis not present

## 2014-03-31 DIAGNOSIS — N189 Chronic kidney disease, unspecified: Secondary | ICD-10-CM | POA: Diagnosis not present

## 2014-04-07 DIAGNOSIS — M109 Gout, unspecified: Secondary | ICD-10-CM | POA: Diagnosis not present

## 2014-04-07 DIAGNOSIS — E119 Type 2 diabetes mellitus without complications: Secondary | ICD-10-CM | POA: Diagnosis not present

## 2014-04-07 DIAGNOSIS — N189 Chronic kidney disease, unspecified: Secondary | ICD-10-CM | POA: Diagnosis not present

## 2014-04-07 DIAGNOSIS — E782 Mixed hyperlipidemia: Secondary | ICD-10-CM | POA: Diagnosis not present

## 2014-04-07 DIAGNOSIS — I10 Essential (primary) hypertension: Secondary | ICD-10-CM | POA: Diagnosis not present

## 2014-04-07 DIAGNOSIS — E559 Vitamin D deficiency, unspecified: Secondary | ICD-10-CM | POA: Diagnosis not present

## 2014-04-07 DIAGNOSIS — I248 Other forms of acute ischemic heart disease: Secondary | ICD-10-CM | POA: Diagnosis not present

## 2014-04-13 DIAGNOSIS — I10 Essential (primary) hypertension: Secondary | ICD-10-CM | POA: Diagnosis not present

## 2014-04-13 DIAGNOSIS — Z6838 Body mass index (BMI) 38.0-38.9, adult: Secondary | ICD-10-CM | POA: Diagnosis not present

## 2014-04-13 DIAGNOSIS — M5126 Other intervertebral disc displacement, lumbar region: Secondary | ICD-10-CM | POA: Diagnosis not present

## 2014-04-16 DIAGNOSIS — M48061 Spinal stenosis, lumbar region without neurogenic claudication: Secondary | ICD-10-CM | POA: Diagnosis not present

## 2014-04-16 DIAGNOSIS — M5137 Other intervertebral disc degeneration, lumbosacral region: Secondary | ICD-10-CM | POA: Diagnosis not present

## 2014-04-16 DIAGNOSIS — M5126 Other intervertebral disc displacement, lumbar region: Secondary | ICD-10-CM | POA: Diagnosis not present

## 2014-04-23 DIAGNOSIS — Z6837 Body mass index (BMI) 37.0-37.9, adult: Secondary | ICD-10-CM | POA: Diagnosis not present

## 2014-04-23 DIAGNOSIS — M5126 Other intervertebral disc displacement, lumbar region: Secondary | ICD-10-CM | POA: Diagnosis not present

## 2014-05-14 DIAGNOSIS — Z23 Encounter for immunization: Secondary | ICD-10-CM | POA: Diagnosis not present

## 2014-05-21 ENCOUNTER — Other Ambulatory Visit: Payer: Self-pay | Admitting: Neurosurgery

## 2014-05-25 ENCOUNTER — Encounter (HOSPITAL_COMMUNITY): Payer: Self-pay | Admitting: Pharmacy Technician

## 2014-05-27 ENCOUNTER — Ambulatory Visit (HOSPITAL_COMMUNITY)
Admission: RE | Admit: 2014-05-27 | Discharge: 2014-05-27 | Disposition: A | Discharge status: Home / Self Care | Payer: Medicare Other | Source: Ambulatory Visit | Attending: Anesthesiology | Admitting: Anesthesiology

## 2014-05-27 ENCOUNTER — Encounter (HOSPITAL_COMMUNITY)
Admission: RE | Admit: 2014-05-27 | Discharge: 2014-05-27 | Disposition: A | Discharge status: Home / Self Care | Payer: Medicare Other | Source: Ambulatory Visit | Attending: Neurosurgery | Admitting: Neurosurgery

## 2014-05-27 ENCOUNTER — Encounter (HOSPITAL_COMMUNITY): Payer: Self-pay

## 2014-05-27 DIAGNOSIS — M545 Low back pain: Secondary | ICD-10-CM | POA: Diagnosis not present

## 2014-05-27 DIAGNOSIS — Z01818 Encounter for other preprocedural examination: Secondary | ICD-10-CM | POA: Diagnosis not present

## 2014-05-27 LAB — BASIC METABOLIC PANEL
Anion gap: 15 (ref 5–15)
BUN: 15 mg/dL (ref 6–23)
CO2: 25 mEq/L (ref 19–32)
Calcium: 9.2 mg/dL (ref 8.4–10.5)
Chloride: 104 mEq/L (ref 96–112)
Creatinine, Ser: 1.12 mg/dL — ABNORMAL HIGH (ref 0.50–1.10)
GFR calc Af Amer: 57 mL/min — ABNORMAL LOW (ref >90)
GFR calc non Af Amer: 49 mL/min — ABNORMAL LOW (ref >90)
Glucose, Bld: 91 mg/dL (ref 70–99)
Potassium: 4.2 mEq/L (ref 3.7–5.3)
Sodium: 144 mEq/L (ref 137–147)

## 2014-05-27 LAB — CBC
HCT: 39.7 % (ref 36.0–46.0)
Hemoglobin: 13.4 g/dL (ref 12.0–15.0)
MCH: 30.1 pg (ref 26.0–34.0)
MCHC: 33.8 g/dL (ref 30.0–36.0)
MCV: 89.2 fL (ref 78.0–100.0)
Platelets: 239 10*3/uL (ref 150–400)
RBC: 4.45 MIL/uL (ref 3.87–5.11)
RDW: 13.8 % (ref 11.5–15.5)
WBC: 7.6 10*3/uL (ref 4.0–10.5)

## 2014-05-27 LAB — SURGICAL PCR SCREEN
MRSA, PCR: NEGATIVE
Staphylococcus aureus: NEGATIVE

## 2014-05-27 NOTE — Pre-Procedure Instructions (Signed)
[name]  05/27/2014   Your procedure is scheduled on:  Friday October 9 th at 0730 AM  Report to [facility] at 0530 AM.  Call this number if you have problems the morning of surgery: [phone]   Remember:   Do not eat food or drink liquids after midnight.   Take these medicines the morning of surgery with A SIP OF WATER: Norvasc(Amlodipine),and Metoprolol(Lopressor),  Stop Aspirin , Nsaids, Herbal meds, and multivitamins 5 day prior to surgery  Do not wear jewelry, make-up or nail polish.  Do not wear lotions, powders, or perfumes. You may wear deodorant.  Do not shave 48 hours prior to surgery. Men may shave face and neck.  Do not bring valuables to the hospital.  [facility] is not responsible    for any belongings or valuables.               Contacts, dentures or bridgework may not be worn into surgery.  Leave suitcase in the car. After surgery it may be brought to your room.  For patients admitted to the hospital, discharge time is determined by your    treatment team.               Patients discharged the day of surgery will not be allowed to drive home.    Special Instructions: [place] - Preparing for Surgery  Before surgery, you can play an important role.  Because skin is not sterile, your skin needs to be as free of germs as possible.  You can reduce the number of germs on you skin by washing with CHG (chlorahexidine gluconate) soap before surgery.  CHG is an antiseptic cleaner which kills germs and bonds with the skin to continue killing germs even after washing.  Please DO NOT use if you have an allergy to CHG or antibacterial soaps.  If your skin becomes reddened/irritated stop using the CHG and inform your nurse when you arrive at Short Stay.  Do not shave (including legs and underarms) for at least 48 hours prior to the first CHG shower.  You may shave your face.  Please follow these instructions carefully:   1.  Shower with CHG Soap  the night before surgery and the                                morning of Surgery.  2.  If you choose to wash your hair, wash your hair first as usual with your       normal shampoo.  3.  After you shampoo, rinse your hair and body thoroughly to remove the                      Shampoo.  4.  Use CHG as you would any other liquid soap.  You can apply chg directly       to the skin and wash gently with scrungie or a clean washcloth.  5.  Apply the CHG Soap to your body ONLY FROM THE NECK DOWN.        Do not use on open wounds or open sores.  Avoid contact with your eyes,       ears, mouth and genitals (private parts).  Wash genitals (private parts)       with your normal soap.  6.  Wash thoroughly, paying special attention to the area where your surgery  will be performed.  7.  Thoroughly rinse your body with warm water from the neck down.  8.  DO NOT shower/wash with your normal soap after using and rinsing off       the CHG Soap.  9.  Pat yourself dry with a clean towel.            10.  Wear clean pajamas.            11.  Place clean sheets on your bed the night of your first shower and do not        sleep with pets.  Day of Surgery  Do not apply any lotions/deoderants the morning of surgery.  Please wear clean clothes to the hospital/surgery center.      Please read over the following fact sheets that you were given: Pain Booklet, Coughing and Deep Breathing, MRSA Information and Surgical Site Infection Prevention

## 2014-05-28 NOTE — Progress Notes (Addendum)
Anesthesia Chart Review:   Pt is 68 year old female scheduled for 1 level lumbar laminectomy/decompression on 06/04/14 with Dr. Saintclair Halsted.   PMH: CAD, MI 2008 with stenting, CABG 2013, HTN, aortic stenosis, DM, 1st degree AV block, dyslipidemia. BMI 37.   Medications include: ASA, amlodipine, lisinopril, metoprolol, metformin, nitroglycerin, pravastatin, allopurinol.   Preoperative labs reviewed.  Cr 1.12. This is consistent with results from 2 years ago (range 1.09-1.16).   Chest x-ray reviewed.  1. Prior CABG, with borderline enlargement of the cardiopericardial silhouette. Atherosclerotic aortic arch.  2. Thoracic spondylosis.  3. No acute findings.  EKG:  Sinus rhythm with 1st degree A-V block Anteroseptal infarct , age undetermined No significant change since last tracing 09/06/11  2D echo 09/03/2011: -Left ventricle: The cavity size was normal. Systolic function was normal. The estimated ejection fraction was in the range of 55% to 60%. There was an increased relative contribution of atrial contraction to ventricular filling. -No stenosis of aortic valve.   Discussed with Dr. Oletta Lamas.  She asked that I reach out to pt's cardiologist for his input on surgery. Cardiologist is Physiological scientist. Last seen 01/15/2014. Follow up is for 1 year. I contacted Dr. Harl Bowie via staff message in [facility] and asked if he had any concerns regarding her upcoming surgery.  He responded "She had CABG less than 2 years ago, normal cardiac function with echo around that time. From cardiac standpoint there is no contraindication to her upcoming surgery."  If no changes, I anticipate pt can proceed with surgery as scheduled.    Willeen Cass, FNP-BC [facility]/Anesthesiology Phone: [phone] 05/28/2014 2:05 PM

## 2014-06-04 ENCOUNTER — Encounter (HOSPITAL_COMMUNITY)
Admission: RE | Disposition: A | Discharge status: Home / Self Care | Payer: Self-pay | Source: Ambulatory Visit | Attending: Neurosurgery

## 2014-06-04 ENCOUNTER — Encounter (HOSPITAL_COMMUNITY): Payer: Self-pay | Admitting: *Deleted

## 2014-06-04 ENCOUNTER — Encounter (HOSPITAL_COMMUNITY): Payer: Medicare Other | Admitting: Emergency Medicine

## 2014-06-04 ENCOUNTER — Inpatient Hospital Stay (HOSPITAL_COMMUNITY): Payer: Medicare Other

## 2014-06-04 ENCOUNTER — Inpatient Hospital Stay (HOSPITAL_COMMUNITY): Payer: Medicare Other | Admitting: Emergency Medicine

## 2014-06-04 ENCOUNTER — Ambulatory Visit (HOSPITAL_COMMUNITY)
Admission: RE | Admit: 2014-06-04 | Discharge: 2014-06-05 | Disposition: A | Discharge status: Home / Self Care | Payer: Medicare Other | Source: Ambulatory Visit | Attending: Neurosurgery | Admitting: Neurosurgery

## 2014-06-04 DIAGNOSIS — I440 Atrioventricular block, first degree: Secondary | ICD-10-CM | POA: Diagnosis not present

## 2014-06-04 DIAGNOSIS — M5116 Intervertebral disc disorders with radiculopathy, lumbar region: Principal | ICD-10-CM | POA: Insufficient documentation

## 2014-06-04 DIAGNOSIS — Z6836 Body mass index (BMI) 36.0-36.9, adult: Secondary | ICD-10-CM | POA: Insufficient documentation

## 2014-06-04 DIAGNOSIS — E669 Obesity, unspecified: Secondary | ICD-10-CM | POA: Diagnosis not present

## 2014-06-04 DIAGNOSIS — I350 Nonrheumatic aortic (valve) stenosis: Secondary | ICD-10-CM | POA: Diagnosis not present

## 2014-06-04 DIAGNOSIS — E119 Type 2 diabetes mellitus without complications: Secondary | ICD-10-CM | POA: Diagnosis not present

## 2014-06-04 DIAGNOSIS — M5126 Other intervertebral disc displacement, lumbar region: Secondary | ICD-10-CM | POA: Diagnosis present

## 2014-06-04 DIAGNOSIS — Z419 Encounter for procedure for purposes other than remedying health state, unspecified: Secondary | ICD-10-CM

## 2014-06-04 DIAGNOSIS — M1990 Unspecified osteoarthritis, unspecified site: Secondary | ICD-10-CM | POA: Diagnosis not present

## 2014-06-04 DIAGNOSIS — I252 Old myocardial infarction: Secondary | ICD-10-CM | POA: Diagnosis not present

## 2014-06-04 DIAGNOSIS — I10 Essential (primary) hypertension: Secondary | ICD-10-CM | POA: Diagnosis not present

## 2014-06-04 DIAGNOSIS — E785 Hyperlipidemia, unspecified: Secondary | ICD-10-CM | POA: Diagnosis not present

## 2014-06-04 DIAGNOSIS — M4807 Spinal stenosis, lumbosacral region: Secondary | ICD-10-CM | POA: Diagnosis not present

## 2014-06-04 DIAGNOSIS — Z9889 Other specified postprocedural states: Secondary | ICD-10-CM | POA: Diagnosis not present

## 2014-06-04 DIAGNOSIS — M549 Dorsalgia, unspecified: Secondary | ICD-10-CM | POA: Diagnosis not present

## 2014-06-04 DIAGNOSIS — I2510 Atherosclerotic heart disease of native coronary artery without angina pectoris: Secondary | ICD-10-CM | POA: Diagnosis not present

## 2014-06-04 HISTORY — PX: LUMBAR LAMINECTOMY/DECOMPRESSION MICRODISCECTOMY: SHX5026

## 2014-06-04 LAB — GLUCOSE, CAPILLARY
Glucose-Capillary: 130 mg/dL — ABNORMAL HIGH (ref 70–99)
Glucose-Capillary: 123 mg/dL — ABNORMAL HIGH (ref 70–99)
Glucose-Capillary: 133 mg/dL — ABNORMAL HIGH (ref 70–99)
Glucose-Capillary: 175 mg/dL — ABNORMAL HIGH (ref 70–99)
Glucose-Capillary: 135 mg/dL — ABNORMAL HIGH (ref 70–99)

## 2014-06-04 SURGERY — LUMBAR LAMINECTOMY/DECOMPRESSION MICRODISCECTOMY 1 LEVEL
Anesthesia: General | Site: Back | Laterality: Left

## 2014-06-04 MED ORDER — MIDAZOLAM HCL 5 MG/5ML IJ SOLN
INTRAMUSCULAR | Status: DC | PRN
  Administered 2014-06-04: 2 mg via INTRAVENOUS

## 2014-06-04 MED ORDER — PROPOFOL 10 MG/ML IV BOLUS
INTRAVENOUS | Status: DC | PRN
  Administered 2014-06-04: 200 mg via INTRAVENOUS

## 2014-06-04 MED ORDER — FENTANYL CITRATE 0.05 MG/ML IJ SOLN
INTRAMUSCULAR | Status: AC
  Filled 2014-06-04: qty 5

## 2014-06-04 MED ORDER — CEFAZOLIN SODIUM-DEXTROSE 2-3 GM-% IV SOLR
INTRAVENOUS | Status: AC
  Filled 2014-06-04: qty 50

## 2014-06-04 MED ORDER — HYDROMORPHONE HCL 1 MG/ML IJ SOLN: 0.2500 mg | INTRAMUSCULAR | Status: DC | PRN

## 2014-06-04 MED ORDER — CEFAZOLIN SODIUM-DEXTROSE 2-3 GM-% IV SOLR
INTRAVENOUS | Status: DC | PRN
  Administered 2014-06-04: 2 g via INTRAVENOUS

## 2014-06-04 MED ORDER — GLYCOPYRROLATE 0.2 MG/ML IJ SOLN
INTRAMUSCULAR | Status: DC | PRN
  Administered 2014-06-04: 0.4 mg via INTRAVENOUS
  Administered 2014-06-04 (×2): 0.1 mg via INTRAVENOUS

## 2014-06-04 MED ORDER — OXYCODONE HCL 5 MG PO TABS: 5.0000 mg | ORAL_TABLET | Freq: Once | ORAL | Status: DC | PRN

## 2014-06-04 MED ORDER — METFORMIN HCL 500 MG PO TABS
1000.0000 mg | ORAL_TABLET | Freq: Every day | ORAL | Status: DC
  Administered 2014-06-04: 1000 mg via ORAL
  Filled 2014-06-04 (×2): qty 2

## 2014-06-04 MED ORDER — ONDANSETRON HCL 4 MG/2ML IJ SOLN
INTRAMUSCULAR | Status: DC | PRN
  Administered 2014-06-04: 4 mg via INTRAVENOUS

## 2014-06-04 MED ORDER — SODIUM CHLORIDE 0.9 % IV SOLN
10.0000 mg | INTRAVENOUS | Status: DC | PRN
  Administered 2014-06-04: 15 ug/min via INTRAVENOUS

## 2014-06-04 MED ORDER — PROPOFOL 10 MG/ML IV BOLUS
INTRAVENOUS | Status: AC
  Filled 2014-06-04: qty 20

## 2014-06-04 MED ORDER — ADULT MULTIVITAMIN W/MINERALS CH
1.0000 | ORAL_TABLET | Freq: Every day | ORAL | Status: DC
  Administered 2014-06-04 – 2014-06-05 (×2): 1 via ORAL
  Filled 2014-06-04 (×2): qty 1

## 2014-06-04 MED ORDER — OXYCODONE-ACETAMINOPHEN 5-325 MG PO TABS
1.0000 | ORAL_TABLET | ORAL | Status: DC | PRN
  Administered 2014-06-04 – 2014-06-05 (×2): 1 via ORAL
  Filled 2014-06-04 (×2): qty 1

## 2014-06-04 MED ORDER — AMLODIPINE BESYLATE 5 MG PO TABS
5.0000 mg | ORAL_TABLET | Freq: Every day | ORAL | Status: DC
  Administered 2014-06-05: 5 mg via ORAL
  Filled 2014-06-04 (×2): qty 1

## 2014-06-04 MED ORDER — BACITRACIN 50000 UNITS IM SOLR
INTRAMUSCULAR | Status: DC | PRN
  Administered 2014-06-04: 07:00:00

## 2014-06-04 MED ORDER — DOCUSATE SODIUM 100 MG PO CAPS
100.0000 mg | ORAL_CAPSULE | Freq: Two times a day (BID) | ORAL | Status: DC
  Administered 2014-06-04 – 2014-06-05 (×3): 100 mg via ORAL
  Filled 2014-06-04 (×4): qty 1

## 2014-06-04 MED ORDER — ASPIRIN 81 MG PO TABS: 81.0000 mg | ORAL_TABLET | Freq: Every day | ORAL | Status: DC

## 2014-06-04 MED ORDER — MENTHOL 3 MG MT LOZG: 1.0000 | LOZENGE | OROMUCOSAL | Status: DC | PRN

## 2014-06-04 MED ORDER — LACTATED RINGERS IV SOLN
INTRAVENOUS | Status: DC | PRN
  Administered 2014-06-04 (×2): via INTRAVENOUS

## 2014-06-04 MED ORDER — DEXAMETHASONE SODIUM PHOSPHATE 10 MG/ML IJ SOLN
INTRAMUSCULAR | Status: AC
  Filled 2014-06-04: qty 1

## 2014-06-04 MED ORDER — CYCLOBENZAPRINE HCL 10 MG PO TABS: 10.0000 mg | ORAL_TABLET | Freq: Three times a day (TID) | ORAL | Status: DC | PRN

## 2014-06-04 MED ORDER — HYDROMORPHONE HCL 1 MG/ML IJ SOLN: 0.5000 mg | INTRAMUSCULAR | Status: DC | PRN

## 2014-06-04 MED ORDER — SODIUM CHLORIDE 0.9 % IJ SOLN
INTRAMUSCULAR | Status: AC
  Filled 2014-06-04: qty 10

## 2014-06-04 MED ORDER — GLYCOPYRROLATE 0.2 MG/ML IJ SOLN
INTRAMUSCULAR | Status: AC
  Filled 2014-06-04: qty 2

## 2014-06-04 MED ORDER — OXYCODONE-ACETAMINOPHEN 5-325 MG PO TABS: 1.0000 | ORAL_TABLET | ORAL | Status: DC | PRN

## 2014-06-04 MED ORDER — EPHEDRINE SULFATE 50 MG/ML IJ SOLN
INTRAMUSCULAR | Status: AC
  Filled 2014-06-04: qty 1

## 2014-06-04 MED ORDER — OXYCODONE HCL 5 MG/5ML PO SOLN: 5.0000 mg | Freq: Once | ORAL | Status: DC | PRN

## 2014-06-04 MED ORDER — DEXAMETHASONE SODIUM PHOSPHATE 4 MG/ML IJ SOLN
INTRAMUSCULAR | Status: DC | PRN
  Administered 2014-06-04: 4 mg via INTRAVENOUS

## 2014-06-04 MED ORDER — LIDOCAINE-EPINEPHRINE 1 %-1:100000 IJ SOLN
INTRAMUSCULAR | Status: DC | PRN
  Administered 2014-06-04: 10 mL

## 2014-06-04 MED ORDER — LIDOCAINE HCL (CARDIAC) 20 MG/ML IV SOLN
INTRAVENOUS | Status: AC
  Filled 2014-06-04: qty 5

## 2014-06-04 MED ORDER — ACETAMINOPHEN 325 MG PO TABS: 650.0000 mg | ORAL_TABLET | ORAL | Status: DC | PRN

## 2014-06-04 MED ORDER — 0.9 % SODIUM CHLORIDE (POUR BTL) OPTIME
TOPICAL | Status: DC | PRN
  Administered 2014-06-04: 1000 mL

## 2014-06-04 MED ORDER — CEFAZOLIN SODIUM 1-5 GM-% IV SOLN
1.0000 g | Freq: Three times a day (TID) | INTRAVENOUS | Status: AC
  Administered 2014-06-04 (×2): 1 g via INTRAVENOUS
  Filled 2014-06-04 (×2): qty 50

## 2014-06-04 MED ORDER — PHENYLEPHRINE 40 MCG/ML (10ML) SYRINGE FOR IV PUSH (FOR BLOOD PRESSURE SUPPORT)
PREFILLED_SYRINGE | INTRAVENOUS | Status: AC
  Filled 2014-06-04: qty 10

## 2014-06-04 MED ORDER — BUPIVACAINE HCL (PF) 0.25 % IJ SOLN
INTRAMUSCULAR | Status: DC | PRN
  Administered 2014-06-04: 10 mL

## 2014-06-04 MED ORDER — ROCURONIUM BROMIDE 100 MG/10ML IV SOLN
INTRAVENOUS | Status: DC | PRN
  Administered 2014-06-04 (×3): 80 mg via INTRAVENOUS

## 2014-06-04 MED ORDER — ONDANSETRON HCL 4 MG/2ML IJ SOLN
INTRAMUSCULAR | Status: AC
  Filled 2014-06-04: qty 2

## 2014-06-04 MED ORDER — METFORMIN HCL 500 MG PO TABS
500.0000 mg | ORAL_TABLET | Freq: Every day | ORAL | Status: DC
  Administered 2014-06-05: 500 mg via ORAL
  Filled 2014-06-04 (×3): qty 1

## 2014-06-04 MED ORDER — ASPIRIN EC 81 MG PO TBEC
81.0000 mg | DELAYED_RELEASE_TABLET | Freq: Every day | ORAL | Status: DC
  Administered 2014-06-04 – 2014-06-05 (×2): 81 mg via ORAL
  Filled 2014-06-04 (×2): qty 1

## 2014-06-04 MED ORDER — DEXAMETHASONE SODIUM PHOSPHATE 10 MG/ML IJ SOLN
10.0000 mg | INTRAMUSCULAR | Status: DC
  Filled 2014-06-04: qty 1

## 2014-06-04 MED ORDER — PHENOL 1.4 % MT LIQD: 1.0000 | OROMUCOSAL | Status: DC | PRN

## 2014-06-04 MED ORDER — LIDOCAINE HCL (CARDIAC) 20 MG/ML IV SOLN
INTRAVENOUS | Status: DC | PRN
  Administered 2014-06-04: 100 mg via INTRAVENOUS

## 2014-06-04 MED ORDER — NEOSTIGMINE METHYLSULFATE 10 MG/10ML IV SOLN
INTRAVENOUS | Status: DC | PRN
  Administered 2014-06-04: 3 mg via INTRAVENOUS

## 2014-06-04 MED ORDER — FENTANYL CITRATE 0.05 MG/ML IJ SOLN
INTRAMUSCULAR | Status: DC | PRN
  Administered 2014-06-04: 50 ug via INTRAVENOUS
  Administered 2014-06-04: 100 ug via INTRAVENOUS

## 2014-06-04 MED ORDER — LISINOPRIL 40 MG PO TABS
40.0000 mg | ORAL_TABLET | Freq: Every day | ORAL | Status: DC
  Administered 2014-06-04 – 2014-06-05 (×2): 40 mg via ORAL
  Filled 2014-06-04 (×2): qty 1

## 2014-06-04 MED ORDER — ONDANSETRON HCL 4 MG/2ML IJ SOLN
4.0000 mg | INTRAMUSCULAR | Status: DC | PRN
Start: 2014-06-04 — End: 2014-06-05

## 2014-06-04 MED ORDER — METFORMIN HCL 500 MG PO TABS: 500.0000 mg | ORAL_TABLET | Freq: Two times a day (BID) | ORAL | Status: DC

## 2014-06-04 MED ORDER — ONDANSETRON HCL 4 MG/2ML IJ SOLN: 4.0000 mg | Freq: Once | INTRAMUSCULAR | Status: DC | PRN

## 2014-06-04 MED ORDER — MIDAZOLAM HCL 2 MG/2ML IJ SOLN
INTRAMUSCULAR | Status: AC
  Filled 2014-06-04: qty 2

## 2014-06-04 MED ORDER — ROCURONIUM BROMIDE 50 MG/5ML IV SOLN
INTRAVENOUS | Status: AC
  Filled 2014-06-04: qty 1

## 2014-06-04 MED ORDER — PRAVASTATIN SODIUM 40 MG PO TABS
40.0000 mg | ORAL_TABLET | Freq: Every day | ORAL | Status: DC
  Administered 2014-06-04 – 2014-06-05 (×2): 40 mg via ORAL
  Filled 2014-06-04 (×2): qty 1

## 2014-06-04 MED ORDER — METOPROLOL TARTRATE 25 MG PO TABS
25.0000 mg | ORAL_TABLET | Freq: Two times a day (BID) | ORAL | Status: DC
  Administered 2014-06-04 – 2014-06-05 (×2): 25 mg via ORAL
  Filled 2014-06-04 (×3): qty 1

## 2014-06-04 MED ORDER — HEMOSTATIC AGENTS (NO CHARGE) OPTIME
TOPICAL | Status: DC | PRN
  Administered 2014-06-04: 1 via TOPICAL

## 2014-06-04 MED ORDER — DEXAMETHASONE SODIUM PHOSPHATE 4 MG/ML IJ SOLN
INTRAMUSCULAR | Status: AC
  Filled 2014-06-04: qty 1

## 2014-06-04 MED ORDER — ACETAMINOPHEN 650 MG RE SUPP: 650.0000 mg | RECTAL | Status: DC | PRN

## 2014-06-04 MED ORDER — ALLOPURINOL 100 MG PO TABS
100.0000 mg | ORAL_TABLET | Freq: Every day | ORAL | Status: DC
  Administered 2014-06-04 – 2014-06-05 (×2): 100 mg via ORAL
  Filled 2014-06-04 (×2): qty 1

## 2014-06-04 MED ORDER — SODIUM CHLORIDE 0.9 % IJ SOLN
3.0000 mL | Freq: Two times a day (BID) | INTRAMUSCULAR | Status: DC
  Administered 2014-06-04 (×2): 3 mL via INTRAVENOUS

## 2014-06-04 MED ORDER — GLYCOPYRROLATE 0.2 MG/ML IJ SOLN
INTRAMUSCULAR | Status: AC
  Filled 2014-06-04: qty 1

## 2014-06-04 MED ORDER — SODIUM CHLORIDE 0.9 % IJ SOLN: 3.0000 mL | INTRAMUSCULAR | Status: DC | PRN

## 2014-06-04 MED ORDER — NITROGLYCERIN 0.4 MG SL SUBL: 0.4000 mg | SUBLINGUAL_TABLET | SUBLINGUAL | Status: DC | PRN

## 2014-06-04 SURGICAL SUPPLY — 60 items
ADH SKN CLS APL DERMABOND .7 (GAUZE/BANDAGES/DRESSINGS) ×1
APL SKNCLS STERI-STRIP NONHPOA (GAUZE/BANDAGES/DRESSINGS) ×1
BAG DECANTER FOR FLEXI CONT (MISCELLANEOUS) ×2 IMPLANT
BENZOIN TINCTURE PRP APPL 2/3 (GAUZE/BANDAGES/DRESSINGS) ×2 IMPLANT
BLADE CLIPPER SURG (BLADE) IMPLANT
BLADE SURG 11 STRL SS (BLADE) ×2 IMPLANT
BRUSH SCRUB EZ PLAIN DRY (MISCELLANEOUS) ×2 IMPLANT
BUR MATCHSTICK NEURO 3.0 LAGG (BURR) ×2 IMPLANT
BUR PRECISION FLUTE 6.0 (BURR) ×2 IMPLANT
CANISTER SUCT 3000ML (MISCELLANEOUS) ×2 IMPLANT
CONT SPEC 4OZ CLIKSEAL STRL BL (MISCELLANEOUS) ×2 IMPLANT
DECANTER SPIKE VIAL GLASS SM (MISCELLANEOUS) ×2 IMPLANT
DERMABOND ADVANCED (GAUZE/BANDAGES/DRESSINGS) ×1
DERMABOND ADVANCED .7 DNX12 (GAUZE/BANDAGES/DRESSINGS) ×1 IMPLANT
DRAPE LAPAROTOMY 100X72X124 (DRAPES) ×2 IMPLANT
DRAPE MICROSCOPE LEICA (MISCELLANEOUS) ×3 IMPLANT
DRAPE POUCH INSTRU U-SHP 10X18 (DRAPES) ×2 IMPLANT
DRAPE PROXIMA HALF (DRAPES) IMPLANT
DRAPE SURG 17X23 STRL (DRAPES) ×2 IMPLANT
DRSG OPSITE 4X5.5 SM (GAUZE/BANDAGES/DRESSINGS) ×1 IMPLANT
DRSG OPSITE POSTOP 4X6 (GAUZE/BANDAGES/DRESSINGS) ×1 IMPLANT
DURAPREP 26ML APPLICATOR (WOUND CARE) ×2 IMPLANT
ELECT REM PT RETURN 9FT ADLT (ELECTROSURGICAL) ×2
ELECTRODE REM PT RTRN 9FT ADLT (ELECTROSURGICAL) ×1 IMPLANT
GAUZE SPONGE 4X4 12PLY STRL (GAUZE/BANDAGES/DRESSINGS) ×1 IMPLANT
GAUZE SPONGE 4X4 16PLY XRAY LF (GAUZE/BANDAGES/DRESSINGS) IMPLANT
GLOVE BIO SURGEON STRL SZ8 (GLOVE) ×2 IMPLANT
GLOVE BIO SURGEON STRL SZ8.5 (GLOVE) ×1 IMPLANT
GLOVE EXAM NITRILE LRG STRL (GLOVE) IMPLANT
GLOVE EXAM NITRILE MD LF STRL (GLOVE) IMPLANT
GLOVE EXAM NITRILE XL STR (GLOVE) IMPLANT
GLOVE EXAM NITRILE XS STR PU (GLOVE) IMPLANT
GLOVE INDICATOR 7.5 STRL GRN (GLOVE) ×1 IMPLANT
GLOVE INDICATOR 8.5 STRL (GLOVE) ×2 IMPLANT
GLOVE SS BIOGEL STRL SZ 8 (GLOVE) IMPLANT
GLOVE SUPERSENSE BIOGEL SZ 8 (GLOVE) ×1
GLOVE SURG SS PI 7.0 STRL IVOR (GLOVE) ×3 IMPLANT
GOWN STRL REUS W/ TWL LRG LVL3 (GOWN DISPOSABLE) ×1 IMPLANT
GOWN STRL REUS W/ TWL XL LVL3 (GOWN DISPOSABLE) ×2 IMPLANT
GOWN STRL REUS W/TWL 2XL LVL3 (GOWN DISPOSABLE) IMPLANT
GOWN STRL REUS W/TWL LRG LVL3 (GOWN DISPOSABLE) ×2
GOWN STRL REUS W/TWL XL LVL3 (GOWN DISPOSABLE) ×4
KIT BASIN OR (CUSTOM PROCEDURE TRAY) ×2 IMPLANT
KIT ROOM TURNOVER OR (KITS) ×2 IMPLANT
NDL SPNL 22GX3.5 QUINCKE BK (NEEDLE) ×1 IMPLANT
NEEDLE HYPO 22GX1.5 SAFETY (NEEDLE) ×2 IMPLANT
NEEDLE SPNL 22GX3.5 QUINCKE BK (NEEDLE) ×2 IMPLANT
NS IRRIG 1000ML POUR BTL (IV SOLUTION) ×2 IMPLANT
PACK LAMINECTOMY NEURO (CUSTOM PROCEDURE TRAY) ×2 IMPLANT
RUBBERBAND STERILE (MISCELLANEOUS) ×6 IMPLANT
SPONGE SURGIFOAM ABS GEL SZ50 (HEMOSTASIS) ×2 IMPLANT
STRIP CLOSURE SKIN 1/2X4 (GAUZE/BANDAGES/DRESSINGS) ×2 IMPLANT
SUT VIC AB 0 CT1 18XCR BRD8 (SUTURE) ×1 IMPLANT
SUT VIC AB 0 CT1 8-18 (SUTURE) ×2
SUT VIC AB 2-0 CT1 18 (SUTURE) ×2 IMPLANT
SUT VICRYL 4-0 PS2 18IN ABS (SUTURE) ×2 IMPLANT
SYR 20ML ECCENTRIC (SYRINGE) ×2 IMPLANT
TOWEL OR 17X24 6PK STRL BLUE (TOWEL DISPOSABLE) ×2 IMPLANT
TOWEL OR 17X26 10 PK STRL BLUE (TOWEL DISPOSABLE) ×2 IMPLANT
WATER STERILE IRR 1000ML POUR (IV SOLUTION) ×2 IMPLANT

## 2014-06-04 NOTE — Op Note (Signed)
Preoperative diagnosis: Left L5 radiculopathy from lumbar spinal stenosis and herniated nucleus pulposus L4-5 left  Postoperative diagnosis: Same  Procedure: Lumbar laminectomy L4-5 with microdissection of the left L5 nerve root and microscopic discectomy  Surgeon: Dominica Severin Nilesh Stegall  Assistant: Newman Pies  Anesthesia: Gen.  EBL: Minimal  History of present illness: Patient is a very pleasant 68 year old female is a progress worsening back and left leg pain rating down the back and outside of her legs or foot big toe consistent with an L5 nerve root pattern. Workup revealed lumbar spondylosis the herniated nucleus the posts at L4-5 on the left causing severe thecal sac compression and stenosis of the L5 nerve root. Due to patient's failure of conservative treatment imaging findings and progression of clinical syndrome I recommended laminectomy microscope and L4-5 on the left I extensively reviewed the risks and benefits of the operation the patient as well as perioperative course and expectations of outcome and alternatives of surgery and she understood and agreed to proceed forward.  Operative procedure: Patient brought into the or was induced under general anesthesia positioned prone the Wilson frame her back was prepped and draped in routine sterile fashion preoperative x-ray localize the appropriate level so after infiltration of 10 cc lidocaine with epi a midline incision was made and Bovie electrocautery was used taken the subcutaneous tissues and subperiosteal dissections care lamina of L4-L5 on the left. Interoperative x-ray confirmed an indication appropriate level so the interest of the lamina of L4 medial facet complexes suppressant of lamina of L5 was drilled down a high-speed drill laminotomy was begun with a 2 and 3 mm Kerrison punch. Then ligament flavum was identified and removed in piecemeal fashion exposing the thecal sac under microscopic illumination the L5 nerve root is identified  and the L5 pedicle was identified further under biting of the medial facet complex identification of a very large disc herniations still partially contained ligament causing severe compression of the L5 nerve root. The L5 nerve root was gently dissected off of this fragment annulotomy was made with 11 blade scalpel and several large her sister teased out from underneath the L5 nerve root. Then the annulotomy was extended the space cleanout pituitary rongeurs Epstein curettes. At the discectomy there is no further stenosis foraminotomy was further unroofed decompressing the L5 nerve root. At the discectomy the L5 foramen was widely patent usually excepting a coronary.and hockey-stick. The wounds and copiously are good fixing space was maintained there is no further fragments the annulus was a slack and there is no further disc had extensive -0 was removed from laterally underneath the facet complexes well prior to closure. Then Gelfoam was laid up the dura meticulous in a stasis was maintained was then closed in layers with after Vicryl and skin) 4 subcuticular Dermabond benzo and Steri-Strips and sterile dressing were applied patient recovered in stable condition. At the end of case on it counts sponge counts were correct.

## 2014-06-04 NOTE — Anesthesia Preprocedure Evaluation (Addendum)
Anesthesia Evaluation  Patient identified by MRN, date of birth, ID band Patient awake    Reviewed: Allergy & Precautions, H&P , NPO status , Patient's Chart, lab work & pertinent test results, reviewed documented beta blocker date and time   Airway Mallampati: I TM Distance: >3 FB Neck ROM: Full    Dental  (+) Teeth Intact, Dental Advisory Given   Pulmonary  breath sounds clear to auscultation        Cardiovascular hypertension, Pt. on medications and Pt. on home beta blockers + CAD and + Past MI (CABG 2013) Rhythm:Regular Rate:Normal     Neuro/Psych    GI/Hepatic   Endo/Other  diabetes, Well Controlled, Type 2, Oral Hypoglycemic AgentsMorbid obesity  Renal/GU      Musculoskeletal   Abdominal   Peds  Hematology   Anesthesia Other Findings   Reproductive/Obstetrics                         Anesthesia Physical Anesthesia Plan  ASA: III  Anesthesia Plan: General   Post-op Pain Management:    Induction: Intravenous  Airway Management Planned: Oral ETT  Additional Equipment:   Intra-op Plan:   Post-operative Plan: Extubation in OR  Informed Consent: I have reviewed the patients History and Physical, chart, labs and discussed the procedure including the risks, benefits and alternatives for the proposed anesthesia with the patient or authorized representative who has indicated his/her understanding and acceptance.   Dental advisory given  Plan Discussed with: CRNA, Anesthesiologist and Surgeon  Anesthesia Plan Comments:         Anesthesia Quick Evaluation

## 2014-06-04 NOTE — Discharge Instructions (Signed)

## 2014-06-04 NOTE — Transfer of Care (Signed)
Immediate Anesthesia Transfer of Care Note  Patient: [name]  Procedure(s) Performed: Procedure(s): LUMBAR LAMINECTOMY/DECOMPRESSION MICRODISCECTOMY LUMBAR FOUR-FIVE (Left)  Patient Location: PACU  Anesthesia Type:General  Level of Consciousness: awake, alert  and oriented  Airway & Oxygen Therapy: Patient Spontanous Breathing and Patient connected to face mask oxygen  Post-op Assessment: Report given to PACU RN, Post -op Vital signs reviewed and stable and Patient moving all extremities X 4  Post vital signs: Reviewed and stable  Complications: No apparent anesthesia complications

## 2014-06-04 NOTE — H&P (Signed)
[name] is an 68 y.o. female.   Chief Complaint: Back and left leg pain HPI: Patient is a very pleasant 68 year old him as a progress worsening back and predominantly leg pain rating down the back of her thigh back and outside of her calf and the top foot big toe consistent with an L5 nerve root pattern. Workup revealed a large disc herniation severe stenosis at L4-5 on the left. Patient failed all forms of conservative treatment. Due to her progression of clinical syndrome and imaging findings and failure of conservative treatment I recommended laminectomy microdiscectomy at L4-5 on the left. I extensively reviewed the risks and benefits of the operation the patient as well as perioperative course expectations of outcome and alternatives surgery and she understood and agreed to proceed forward.  Past Medical History  Diagnosis Date  . CAD (coronary artery disease)     Non-STEMI/Taxus stenting 100% left anterior descending (2), January 2008. Residual 70% circumflex;80% right coronary artery. Mild LVD (EF 45%);improved to 55-60% by 2-D echocardiogram,January 2009.   [name] Dyslipidemia     takes Pravastatin daily  . Obesity   . Lumbar herniated disc   . Gout     takes Allopurinol daily  . Unspecified essential hypertension     takes Metoprolol,Lisinopril,and Amlodipine  . Myocardial infarction 2008    pt has 2 stents  . Aortic stenosis   . AV block, 1st degree   . Arthritis   . Back pain     occasionally with weather changes  . Herniated disc     left  . Urinary frequency   . Nocturia   . DM (diabetes mellitus)     takes Metformin 1000mg  BID  . Early cataracts, bilateral     Past Surgical History  Procedure Laterality Date  . Back surgery  2009/2010/2011  . Carpal tunnel release      bilateral  . Cholecystectomy  not sure  . Breast biopsy  unknown    left  . Leg tendon surgery  unknown    left   . Abdominal hysterectomy    . Tonsillectomy    . Colonoscopy    . Coronary  artery bypass graft  09/05/2011    Procedure: CORONARY ARTERY BYPASS GRAFTING (CABG);  Surgeon: Grace Isaac, MD;  Location: [place];  Service: Open Heart Surgery;  Laterality: N/A;  CABG times three using left internal mammary artery and left leg greater saphenous vein harvested endoscopically  . Cardiac catheterization  2008    NON-STEMI/TAXUS STENTING 100% PROXIMAL LAD (X2) JANUARY 2008  . Cardiac catheterization  2012    occluded LAD stents, Om1 : 70%, RCA: 80%, Ef 40-45%    Family History  Problem Relation Age of Onset  . Heart attack Sister 34  . Heart attack Brother 33  . Anesthesia problems Neg Hx   . Hypotension Neg Hx   . Malignant hyperthermia Neg Hx   . Pseudochol deficiency Neg Hx    Social History:  reports that she has never smoked. She has never used smokeless tobacco. She reports that she does not drink alcohol or use illicit drugs.  Allergies: No Known Allergies  Medications Prior to Admission  Medication Sig Dispense Refill  . allopurinol (ZYLOPRIM) 100 MG tablet Take 100 mg by mouth daily.        [name] amLODipine (NORVASC) 5 MG tablet Take 1 tablet (5 mg total) by mouth daily.  90 tablet  3  . aspirin 81 MG tablet Take 81 mg  by mouth daily.      [name] lisinopril (PRINIVIL,ZESTRIL) 40 MG tablet Take 1 tablet (40 mg total) by mouth daily.  90 tablet  3  . metFORMIN (GLUCOPHAGE) 500 MG tablet Take 500-1,000 mg by mouth 2 (two) times daily with a meal.       . metoprolol tartrate (LOPRESSOR) 25 MG tablet Take 1 tablet (25 mg total) by mouth 2 (two) times daily.  180 tablet  3  . Multiple Vitamin (MULTIVITAMIN WITH MINERALS) TABS tablet Take 1 tablet by mouth daily.      . nitroGLYCERIN (NITROSTAT) 0.4 MG SL tablet Place 1 tablet (0.4 mg total) under the tongue every 5 (five) minutes as needed for chest pain.  25 tablet  3  . pravastatin (PRAVACHOL) 40 MG tablet Take 40 mg by mouth daily.         Results for orders placed during the hospital encounter of 06/04/14 (from the  past 48 hour(s))  GLUCOSE, CAPILLARY     Status: Abnormal   Collection Time    06/04/14  5:58 AM      Result Value Ref Range   Glucose-Capillary 130 (*) 70 - 99 mg/dL   No results found.  Review of Systems  Constitutional: Negative.   HENT: Negative.   Eyes: Negative.   Respiratory: Negative.   Cardiovascular: Negative.   Gastrointestinal: Negative.   Genitourinary: Negative.   Musculoskeletal: Positive for back pain and myalgias.  Skin: Negative.   Neurological: Positive for tingling and sensory change.  Psychiatric/Behavioral: Negative.     Blood pressure 147/65, pulse 70, temperature 97.9 F (36.6 C), temperature source Oral, resp. rate 20, height 5\' 3"  (1.6 m), weight 94.377 kg (208 lb 1 oz), SpO2 99.00%. Physical Exam  Constitutional: She is oriented to person, place, and time. She appears well-developed and well-nourished.  HENT:  Head: Normocephalic.  Eyes: Pupils are equal, round, and reactive to light.  Neck: Normal range of motion. Neck supple.  Cardiovascular: Normal rate.   Respiratory: Effort normal.  GI: Soft. Bowel sounds are normal.  Neurological: She is alert and oriented to person, place, and time. She has normal strength. GCS eye subscore is 4. GCS verbal subscore is 5. GCS motor subscore is 6.  Reflex Scores:      Patellar reflexes are 0 on the right side.      Achilles reflexes are 0 on the right side and 0 on the left side. Strength is 5 out of 5 in her iliopsoas, quads, hamstrings, gastrocs, anterior tibialis, EHL.     Assessment/Plan 68 year female presents for an L4-5 laminectomy microdiscectomy.  [name] P 06/04/2014, 7:25 AM

## 2014-06-04 NOTE — Addendum Note (Signed)
Addendum created 06/04/14 1032 by Kyung Rudd, CRNA   Modules edited: Anesthesia Blocks and Procedures, Clinical Notes   Clinical Notes:  File: [id number]

## 2014-06-04 NOTE — Plan of Care (Signed)
Problem: Consults Goal: Diagnosis - Spinal Surgery Outcome: Completed/Met Date Met:  06/04/14 Lumbar Laminectomy (Complex)

## 2014-06-04 NOTE — Addendum Note (Signed)
Addendum created 06/04/14 1027 by Kyung Rudd, CRNA   Modules edited: Anesthesia LDA

## 2014-06-04 NOTE — Anesthesia Postprocedure Evaluation (Signed)
  Anesthesia Post-op Note  Patient: [name]  Procedure(s) Performed: Procedure(s): LUMBAR LAMINECTOMY/DECOMPRESSION MICRODISCECTOMY LUMBAR FOUR-FIVE (Left)  Patient Location: PACU  Anesthesia Type: General   Level of Consciousness: awake, alert  and oriented  Airway and Oxygen Therapy: Patient Spontanous Breathing  Post-op Pain: mild  Post-op Assessment: Post-op Vital signs reviewed  Post-op Vital Signs: Reviewed  Last Vitals:  Filed Vitals:   06/04/14 1000  BP:   Pulse: 73  Temp: 36 C  Resp: 21    Complications: No apparent anesthesia complications

## 2014-06-04 NOTE — Discharge Summary (Signed)
  Physician Discharge Summary  Patient ID: [name] MRN: [id number] DOB/AGE: [birth year] 68 y.o.  Admit date: 06/04/2014 Discharge date: 06/04/2014  Admission Diagnoses: Herniated nucleus pulposis L4-5 left  Discharge Diagnoses: Same Active Problems:   HNP (herniated nucleus pulposus), lumbar   Discharged Condition: good  Hospital Course: Patient to the hospital underwent an L4-5 laminectomy microdiscectomy postop patient did very well went to the floor on the floor she was angling and voiding spontaneously tolerating her diet and was stable for discharge home posterior day 1.  Consults: Significant Diagnostic Studies: Treatments: L4-5 laminectomy microdiscectomy Discharge Exam: Blood pressure 133/65, pulse 70, temperature 97.8 F (36.6 C), temperature source Oral, resp. rate 20, height 5\' 3"  (1.6 m), weight 94.377 kg (208 lb 1 oz), SpO2 96.00%. Thank out of 5 wound clean dry and intact  Disposition: Home     Medication List         allopurinol 100 MG tablet  Commonly known as:  ZYLOPRIM  Take 100 mg by mouth daily.     amLODipine 5 MG tablet  Commonly known as:  NORVASC  Take 1 tablet (5 mg total) by mouth daily.     aspirin 81 MG tablet  Take 81 mg by mouth daily.     lisinopril 40 MG tablet  Commonly known as:  PRINIVIL,ZESTRIL  Take 1 tablet (40 mg total) by mouth daily.     metFORMIN 500 MG tablet  Commonly known as:  GLUCOPHAGE  Take 500-1,000 mg by mouth 2 (two) times daily with a meal. Takes 500mg  in the morning and 1000mg  in the evening     metoprolol tartrate 25 MG tablet  Commonly known as:  LOPRESSOR  Take 1 tablet (25 mg total) by mouth 2 (two) times daily.     multivitamin with minerals Tabs tablet  Take 1 tablet by mouth daily.     nitroGLYCERIN 0.4 MG SL tablet  Commonly known as:  NITROSTAT  Place 1 tablet (0.4 mg total) under the tongue every 5 (five) minutes as needed for chest pain.     oxyCODONE-acetaminophen 5-325 MG per tablet   Commonly known as:  PERCOCET/ROXICET  Take 1-2 tablets by mouth every 4 (four) hours as needed for moderate pain.     pravastatin 40 MG tablet  Commonly known as:  PRAVACHOL  Take 40 mg by mouth daily.           Follow-up Information   Follow up with [facility] P, MD.   Specialty:  Neurosurgery   Contact information:   [address] [place] [address] [phone]       Signed: [name] P 06/04/2014, 3:40 PM

## 2014-06-04 NOTE — Anesthesia Procedure Notes (Signed)
Procedure Name: Intubation Date/Time: 06/04/2014 7:40 AM Performed by: Kyung Rudd Pre-anesthesia Checklist: Patient identified, Emergency Drugs available, Suction available, Patient being monitored and Timeout performed Patient Re-evaluated:Patient Re-evaluated prior to inductionOxygen Delivery Method: Circle system utilized Preoxygenation: Pre-oxygenation with 100% oxygen Intubation Type: IV induction Ventilation: Mask ventilation without difficulty Laryngoscope Size: Mac and 3 Grade View: Grade I Tube type: Oral Tube size: 7.0 mm Number of attempts: 1 Airway Equipment and Method: Stylet Placement Confirmation: ETT inserted through vocal cords under direct vision,  positive ETCO2 and breath sounds checked- equal and bilateral Secured at: 22 cm Tube secured with: Tape Dental Injury: Teeth and Oropharynx as per pre-operative assessment

## 2014-06-05 DIAGNOSIS — M5116 Intervertebral disc disorders with radiculopathy, lumbar region: Secondary | ICD-10-CM | POA: Diagnosis not present

## 2014-06-05 LAB — GLUCOSE, CAPILLARY: Glucose-Capillary: 115 mg/dL — ABNORMAL HIGH (ref 70–99)

## 2014-06-05 NOTE — Discharge Summary (Signed)
Physician Discharge Summary  Patient ID: [name] MRN: [id number] DOB/AGE: May 11, 1946 68 y.o.  Admit date: 06/04/2014 Discharge date: 06/05/2014  Admission Diagnoses: L4-5 spinal stenosis, herniated disc, lumbago, lumbar radiculopathy  Discharge Diagnoses: The same Active Problems:   HNP (herniated nucleus pulposus), lumbar   Discharged Condition: good  Hospital Course: Dr. Saintclair Halsted performed an L4-5 laminectomy and discectomy on the patient on 06/04/2014. The surgery went well.  The patient's postoperative course was unremarkable. On postoperative day #1 she requested discharge to home. The patient was given oral and written discharge instructions. All her questions were answered.  Consults: None Significant Diagnostic Studies: None Treatments: L4-5 laminectomy and discectomy using microdissection Discharge Exam: Blood pressure 124/62, pulse 76, temperature 98 F (36.7 C), temperature source Oral, resp. rate 18, height 5\' 3"  (1.6 m), weight 94.377 kg (208 lb 1 oz), SpO2 99.00%. The patient is alert and pleasant. Her dressing is clean and dry. Her strength is normal in her lower extremities.  Disposition: Home  Discharge Instructions   Call MD for:  difficulty breathing, headache or visual disturbances    Complete by:  As directed      Call MD for:  extreme fatigue    Complete by:  As directed      Call MD for:  hives    Complete by:  As directed      Call MD for:  persistant dizziness or light-headedness    Complete by:  As directed      Call MD for:  persistant nausea and vomiting    Complete by:  As directed      Call MD for:  redness, tenderness, or signs of infection (pain, swelling, redness, odor or green/yellow discharge around incision site)    Complete by:  As directed      Call MD for:  severe uncontrolled pain    Complete by:  As directed      Call MD for:  temperature >100.4    Complete by:  As directed      Diet - low sodium heart healthy    Complete by:   As directed      Discharge instructions    Complete by:  As directed   Call [phone] for a followup appointment. Take a stool softener while you are using pain medications.     Driving Restrictions    Complete by:  As directed   Do not drive for 2 weeks.     Increase activity slowly    Complete by:  As directed      Lifting restrictions    Complete by:  As directed   Do not lift more than 5 pounds. No excessive bending or twisting.     May shower / Bathe    Complete by:  As directed   He may shower after the pain she is removed 3 days after surgery. Leave the incision alone.     Remove dressing in 48 hours    Complete by:  As directed   Your stitches are under the scan and will dissolve by themselves. The Steri-Strips will fall off after you take a few showers. Do not rub back or pick at the wound, Leave the wound alone.            Medication List         allopurinol 100 MG tablet  Commonly known as:  ZYLOPRIM  Take 100 mg by mouth daily.     amLODipine 5 MG tablet  Commonly known as:  NORVASC  Take 1 tablet (5 mg total) by mouth daily.     aspirin 81 MG tablet  Take 81 mg by mouth daily.     lisinopril 40 MG tablet  Commonly known as:  PRINIVIL,ZESTRIL  Take 1 tablet (40 mg total) by mouth daily.     metFORMIN 500 MG tablet  Commonly known as:  GLUCOPHAGE  Take 500-1,000 mg by mouth 2 (two) times daily with a meal. Takes 500mg  in the morning and 1000mg  in the evening     metoprolol tartrate 25 MG tablet  Commonly known as:  LOPRESSOR  Take 1 tablet (25 mg total) by mouth 2 (two) times daily.     multivitamin with minerals Tabs tablet  Take 1 tablet by mouth daily.     nitroGLYCERIN 0.4 MG SL tablet  Commonly known as:  NITROSTAT  Place 1 tablet (0.4 mg total) under the tongue every 5 (five) minutes as needed for chest pain.     oxyCODONE-acetaminophen 5-325 MG per tablet  Commonly known as:  PERCOCET/ROXICET  Take 1-2 tablets by mouth every 4 (four)  hours as needed for moderate pain.     pravastatin 40 MG tablet  Commonly known as:  PRAVACHOL  Take 40 mg by mouth daily.           Follow-up Information   Follow up with [facility], MD.   Specialty:  Neurosurgery   Contact information:   [address] [place] [address] [phone]       Signed: Ophelia Charter 06/05/2014, 7:02 AM

## 2014-06-05 NOTE — Progress Notes (Signed)
Patient alert and oriented, mae's well, voiding adequate amount of urine, swallowing without difficulty, no c/o pain. Patient discharged home with family. Script and discharged instructions given to patient. Patient and family stated understanding of d/c instructions given and has an appointment with MD. Aisha Zubin Pontillo RN 

## 2014-06-07 ENCOUNTER — Encounter (HOSPITAL_COMMUNITY): Payer: Self-pay | Admitting: Neurosurgery

## 2014-06-20 ENCOUNTER — Emergency Department (HOSPITAL_COMMUNITY)
Admission: EM | Admit: 2014-06-20 | Discharge: 2014-06-20 | Disposition: A | Discharge status: Home / Self Care | Payer: Medicare Other | Attending: Emergency Medicine | Admitting: Emergency Medicine

## 2014-06-20 ENCOUNTER — Encounter (HOSPITAL_COMMUNITY): Payer: Self-pay | Admitting: Emergency Medicine

## 2014-06-20 DIAGNOSIS — Z9889 Other specified postprocedural states: Secondary | ICD-10-CM | POA: Diagnosis not present

## 2014-06-20 DIAGNOSIS — E669 Obesity, unspecified: Secondary | ICD-10-CM | POA: Diagnosis not present

## 2014-06-20 DIAGNOSIS — H269 Unspecified cataract: Secondary | ICD-10-CM | POA: Insufficient documentation

## 2014-06-20 DIAGNOSIS — I10 Essential (primary) hypertension: Secondary | ICD-10-CM | POA: Diagnosis not present

## 2014-06-20 DIAGNOSIS — E119 Type 2 diabetes mellitus without complications: Secondary | ICD-10-CM | POA: Diagnosis not present

## 2014-06-20 DIAGNOSIS — E785 Hyperlipidemia, unspecified: Secondary | ICD-10-CM | POA: Diagnosis not present

## 2014-06-20 DIAGNOSIS — M10032 Idiopathic gout, left wrist: Secondary | ICD-10-CM | POA: Insufficient documentation

## 2014-06-20 DIAGNOSIS — Z79899 Other long term (current) drug therapy: Secondary | ICD-10-CM | POA: Diagnosis not present

## 2014-06-20 DIAGNOSIS — I2510 Atherosclerotic heart disease of native coronary artery without angina pectoris: Secondary | ICD-10-CM | POA: Insufficient documentation

## 2014-06-20 DIAGNOSIS — M1990 Unspecified osteoarthritis, unspecified site: Secondary | ICD-10-CM | POA: Insufficient documentation

## 2014-06-20 DIAGNOSIS — Z951 Presence of aortocoronary bypass graft: Secondary | ICD-10-CM | POA: Insufficient documentation

## 2014-06-20 DIAGNOSIS — M109 Gout, unspecified: Secondary | ICD-10-CM | POA: Diagnosis present

## 2014-06-20 DIAGNOSIS — Z7982 Long term (current) use of aspirin: Secondary | ICD-10-CM | POA: Diagnosis not present

## 2014-06-20 DIAGNOSIS — I252 Old myocardial infarction: Secondary | ICD-10-CM | POA: Diagnosis not present

## 2014-06-20 LAB — CBG MONITORING, ED: Glucose-Capillary: 127 mg/dL — ABNORMAL HIGH (ref 70–99)

## 2014-06-20 MED ORDER — COLCHICINE 0.6 MG PO TABS
0.6000 mg | ORAL_TABLET | Freq: Once | ORAL | Status: AC
  Administered 2014-06-20: 0.6 mg via ORAL
  Filled 2014-06-20: qty 1

## 2014-06-20 MED ORDER — COLCHICINE 0.6 MG PO TABS: ORAL_TABLET | ORAL | Status: DC

## 2014-06-20 MED ORDER — DEXAMETHASONE SODIUM PHOSPHATE 4 MG/ML IJ SOLN
6.0000 mg | Freq: Once | INTRAMUSCULAR | Status: DC
  Filled 2014-06-20: qty 2

## 2014-06-20 MED ORDER — PREDNISONE (PAK) 10 MG PO TABS: ORAL_TABLET | Freq: Every day | ORAL | Status: DC

## 2014-06-20 MED ORDER — DEXAMETHASONE SODIUM PHOSPHATE 4 MG/ML IJ SOLN
6.0000 mg | Freq: Once | INTRAMUSCULAR | Status: AC
  Administered 2014-06-20: 6 mg via INTRAMUSCULAR

## 2014-06-20 NOTE — Discharge Instructions (Signed)
Follow up with your doctor tomorrow. Return here as needed.   Gout Gout is an inflammatory arthritis caused by a buildup of uric acid crystals in the joints. Uric acid is a chemical that is normally present in the blood. When the level of uric acid in the blood is too high it can form crystals that deposit in your joints and tissues. This causes joint redness, soreness, and swelling (inflammation). Repeat attacks are common. Over time, uric acid crystals can form into masses (tophi) near a joint, destroying bone and causing disfigurement. Gout is treatable and often preventable. CAUSES  The disease begins with elevated levels of uric acid in the blood. Uric acid is produced by your body when it breaks down a naturally found substance called purines. Certain foods you eat, such as meats and fish, contain high amounts of purines. Causes of an elevated uric acid level include:  Being passed down from parent to child (heredity).  Diseases that cause increased uric acid production (such as obesity, psoriasis, and certain cancers).  Excessive alcohol use.  Diet, especially diets rich in meat and seafood.  Medicines, including certain cancer-fighting medicines (chemotherapy), water pills (diuretics), and aspirin.  Chronic kidney disease. The kidneys are no longer able to remove uric acid well.  Problems with metabolism. Conditions strongly associated with gout include:  Obesity.  High blood pressure.  High cholesterol.  Diabetes. Not everyone with elevated uric acid levels gets gout. It is not understood why some people get gout and others do not. Surgery, joint injury, and eating too much of certain foods are some of the factors that can lead to gout attacks. SYMPTOMS   An attack of gout comes on quickly. It causes intense pain with redness, swelling, and warmth in a joint.  Fever can occur.  Often, only one joint is involved. Certain joints are more commonly involved:  Base of the  big toe.  Knee.  Ankle.  Wrist.  Finger. Without treatment, an attack usually goes away in a few days to weeks. Between attacks, you usually will not have symptoms, which is different from many other forms of arthritis. DIAGNOSIS  Your caregiver will suspect gout based on your symptoms and exam. In some cases, tests may be recommended. The tests may include:  Blood tests.  Urine tests.  X-rays.  Joint fluid exam. This exam requires a needle to remove fluid from the joint (arthrocentesis). Using a microscope, gout is confirmed when uric acid crystals are seen in the joint fluid. TREATMENT  There are two phases to gout treatment: treating the sudden onset (acute) attack and preventing attacks (prophylaxis).  Treatment of an Acute Attack.  Medicines are used. These include anti-inflammatory medicines or steroid medicines.  An injection of steroid medicine into the affected joint is sometimes necessary.  The painful joint is rested. Movement can worsen the arthritis.  You may use warm or cold treatments on painful joints, depending which works best for you.  Treatment to Prevent Attacks.  If you suffer from frequent gout attacks, your caregiver may advise preventive medicine. These medicines are started after the acute attack subsides. These medicines either help your kidneys eliminate uric acid from your body or decrease your uric acid production. You may need to stay on these medicines for a very long time.  The early phase of treatment with preventive medicine can be associated with an increase in acute gout attacks. For this reason, during the first few months of treatment, your caregiver may also advise you  to take medicines usually used for acute gout treatment. Be sure you understand your caregiver's directions. Your caregiver may make several adjustments to your medicine dose before these medicines are effective.  Discuss dietary treatment with your caregiver or dietitian.  Alcohol and drinks high in sugar and fructose and foods such as meat, poultry, and seafood can increase uric acid levels. Your caregiver or dietitian can advise you on drinks and foods that should be limited. HOME CARE INSTRUCTIONS   Do not take aspirin to relieve pain. This raises uric acid levels.  Only take over-the-counter or prescription medicines for pain, discomfort, or fever as directed by your caregiver.  Rest the joint as much as possible. When in bed, keep sheets and blankets off painful areas.  Keep the affected joint raised (elevated).  Apply warm or cold treatments to painful joints. Use of warm or cold treatments depends on which works best for you.  Use crutches if the painful joint is in your leg.  Drink enough fluids to keep your urine clear or pale yellow. This helps your body get rid of uric acid. Limit alcohol, sugary drinks, and fructose drinks.  Follow your dietary instructions. Pay careful attention to the amount of protein you eat. Your daily diet should emphasize fruits, vegetables, whole grains, and fat-free or low-fat milk products. Discuss the use of coffee, vitamin C, and cherries with your caregiver or dietitian. These may be helpful in lowering uric acid levels.  Maintain a healthy body weight. SEEK MEDICAL CARE IF:   You develop diarrhea, vomiting, or any side effects from medicines.  You do not feel better in 24 hours, or you are getting worse. SEEK IMMEDIATE MEDICAL CARE IF:   Your joint becomes suddenly more tender, and you have chills or a fever. MAKE SURE YOU:   Understand these instructions.  Will watch your condition.  Will get help right away if you are not doing well or get worse. Document Released: 08/10/2000 Document Revised: 12/28/2013 Document Reviewed: 03/26/2012 [facility] Patient Information 2015 [place], [place]. This information is not intended to replace advice given to you by your health care provider. Make sure you discuss any  questions you have with your health care provider.

## 2014-06-20 NOTE — ED Notes (Signed)
Pt with hx of gout, pt with left wrist redness and swelling and pain since yesterday, pt states that she ran out of gout medication

## 2014-06-20 NOTE — ED Provider Notes (Signed)
CSN: [id number]     Arrival date & time 06/20/14  1454 History   First MD Initiated Contact with Patient 06/20/14 1601     Chief Complaint  Patient presents with  . Gout     (Consider location/radiation/quality/duration/timing/severity/associated sxs/prior Treatment) Patient is a 68 y.o. female presenting with wrist pain. The history is provided by the patient.  Wrist Pain The current episode started yesterday. The problem occurs constantly. The problem has been gradually worsening. Exacerbated by: movement of wrist, pressure. She has tried nothing for the symptoms.   [name] is a 68 y.o. female who presents to the ED with left wrist pain that started yesterday. She complains of redness, swelling, increased warmth and pain that feels like her gout usually feels. She ran out of the medication she had for gout. She states the pain is 10/10. She had back surgery Oct. 9th and has oxycodone but that does not help this pain.  Past Medical History  Diagnosis Date  . CAD (coronary artery disease)     Non-STEMI/Taxus stenting 100% left anterior descending (2), January 2008. Residual 70% circumflex;80% right coronary artery. Mild LVD (EF 45%);improved to 55-60% by 2-D echocardiogram,January 2009.   [name] Dyslipidemia     takes Pravastatin daily  . Obesity   . Lumbar herniated disc   . Gout     takes Allopurinol daily  . Unspecified essential hypertension     takes Metoprolol,Lisinopril,and Amlodipine  . Myocardial infarction 2008    pt has 2 stents  . Aortic stenosis   . AV block, 1st degree   . Arthritis   . Back pain     occasionally with weather changes  . Herniated disc     left  . Urinary frequency   . Nocturia   . DM (diabetes mellitus)     takes Metformin 1000mg  BID  . Early cataracts, bilateral    Past Surgical History  Procedure Laterality Date  . Back surgery  2009/2010/2011  . Carpal tunnel release      bilateral  . Cholecystectomy  not sure  . Breast biopsy   unknown    left  . Leg tendon surgery  unknown    left   . Abdominal hysterectomy    . Tonsillectomy    . Colonoscopy    . Coronary artery bypass graft  09/05/2011    Procedure: CORONARY ARTERY BYPASS GRAFTING (CABG);  Surgeon: Grace Isaac, MD;  Location: [facility];  Service: Open Heart Surgery;  Laterality: N/A;  CABG times three using left internal mammary artery and left leg greater saphenous vein harvested endoscopically  . Cardiac catheterization  2008    NON-STEMI/TAXUS STENTING 100% PROXIMAL LAD (X2) JANUARY 2008  . Cardiac catheterization  2012    occluded LAD stents, Om1 : 70%, RCA: 80%, Ef 40-45%  . Lumbar laminectomy/decompression microdiscectomy Left 06/04/2014    Procedure: LUMBAR LAMINECTOMY/DECOMPRESSION MICRODISCECTOMY LUMBAR FOUR-FIVE;  Surgeon: Elaina Hoops, MD;  Location: [facility];  Service: Neurosurgery;  Laterality: Left;   Family History  Problem Relation Age of Onset  . Heart attack Sister 53  . Heart attack Brother 38  . Anesthesia problems Neg Hx   . Hypotension Neg Hx   . Malignant hyperthermia Neg Hx   . Pseudochol deficiency Neg Hx    History  Substance Use Topics  . Smoking status: Never Smoker   . Smokeless tobacco: Never Used  . Alcohol Use: No   OB History   Grav Para Term  Preterm Abortions TAB SAB Ect Mult Living                 Review of Systems  Negative except as stated in HPI  Allergies  Review of patient's allergies indicates no known allergies.  Home Medications   Prior to Admission medications   Medication Sig Start Date End Date Taking? Authorizing Provider  allopurinol (ZYLOPRIM) 100 MG tablet Take 100 mg by mouth daily.      Historical Provider, MD  amLODipine (NORVASC) 5 MG tablet Take 1 tablet (5 mg total) by mouth daily. 10/23/11   Wellington Hampshire, MD  aspirin 81 MG tablet Take 81 mg by mouth daily.    Historical Provider, MD  lisinopril (PRINIVIL,ZESTRIL) 40 MG tablet Take 1 tablet (40 mg total) by mouth daily. 10/23/11    Wellington Hampshire, MD  metFORMIN (GLUCOPHAGE) 500 MG tablet Take 500-1,000 mg by mouth 2 (two) times daily with a meal. Takes 500mg  in the morning and 1000mg  in the evening    Historical Provider, MD  metoprolol tartrate (LOPRESSOR) 25 MG tablet Take 1 tablet (25 mg total) by mouth 2 (two) times daily. 07/07/13   Minus Breeding, MD  Multiple Vitamin (MULTIVITAMIN WITH MINERALS) TABS tablet Take 1 tablet by mouth daily.    Historical Provider, MD  nitroGLYCERIN (NITROSTAT) 0.4 MG SL tablet Place 1 tablet (0.4 mg total) under the tongue every 5 (five) minutes as needed for chest pain. 07/14/13   Arnoldo Lenis, MD  oxyCODONE-acetaminophen (PERCOCET/ROXICET) 5-325 MG per tablet Take 1-2 tablets by mouth every 4 (four) hours as needed for moderate pain. 06/04/14   Elaina Hoops, MD  pravastatin (PRAVACHOL) 40 MG tablet Take 40 mg by mouth daily.  07/25/11   Historical Provider, MD   BP 125/73  Pulse 80  Temp(Src) 98.3 F (36.8 C) (Oral)  Resp 20  Ht 5\' 3"  (1.6 m)  Wt 208 lb (94.348 kg)  BMI 36.85 kg/m2  SpO2 100% Physical Exam  Nursing note and vitals reviewed. Constitutional: She is oriented to person, place, and time. She appears well-developed and well-nourished.  HENT:  Head: Normocephalic.  Eyes: EOM are normal.  Neck: Neck supple.  Cardiovascular: Normal rate.   Pulmonary/Chest: Effort normal.  Abdominal: Soft.  Musculoskeletal:       Left wrist: She exhibits tenderness and swelling. She exhibits no deformity and no laceration. Decreased range of motion: due to pain.  Left wrist with pain on palpation, warm, mild swelling, radial pulse strong, adequate circulation.  Neurological: She is alert and oriented to person, place, and time. No cranial nerve deficit.  Skin: Skin is warm and dry.  Psychiatric: She has a normal mood and affect. Her behavior is normal.    ED Course  Procedures (including critical care time) Dr. Lacinda Axon in to examine the patient. Will treat for gout.   Labs  Review  Labs Reviewed  CBG MONITORING, ED - Abnormal; Notable for the following:    Glucose-Capillary 127 (*)    All other components within normal limits     MDM  68 y.o. female with left wrist pain that feels like her gout episodes. Will treat for inflammation. She will follow up with her PCP. She will return here as needed for worsening symptoms. Discussed with the patient and all questioned fully answered.    Medication List    TAKE these medications       colchicine 0.6 MG tablet  Take one tablet PO and may repeat  in one hour if no improvement. Then take one tablet every 3 hours until pain is relieved.     predniSONE 10 MG tablet  Commonly known as:  STERAPRED UNI-PAK  Take by mouth daily. Take 6 tablets PO day one, then 5, 4, 3, 2,1      ASK your doctor about these medications       allopurinol 100 MG tablet  Commonly known as:  ZYLOPRIM  Take 100 mg by mouth at bedtime.     amLODipine 5 MG tablet  Commonly known as:  NORVASC  Take 1 tablet (5 mg total) by mouth daily.     aspirin 81 MG tablet  Take 81 mg by mouth at bedtime.     lisinopril 40 MG tablet  Commonly known as:  PRINIVIL,ZESTRIL  Take 1 tablet (40 mg total) by mouth daily.     metFORMIN 500 MG tablet  Commonly known as:  GLUCOPHAGE  Take 500-1,000 mg by mouth 2 (two) times daily with a meal. Takes 500mg  in the morning and 1000mg  in the evening     metoprolol tartrate 25 MG tablet  Commonly known as:  LOPRESSOR  Take 1 tablet (25 mg total) by mouth 2 (two) times daily.     multivitamin with minerals Tabs tablet  Take 1 tablet by mouth daily.     nitroGLYCERIN 0.4 MG SL tablet  Commonly known as:  NITROSTAT  Place 1 tablet (0.4 mg total) under the tongue every 5 (five) minutes as needed for chest pain.     oxyCODONE-acetaminophen 5-325 MG per tablet  Commonly known as:  PERCOCET/ROXICET  Take 1-2 tablets by mouth every 4 (four) hours as needed for moderate pain.     pravastatin 40 MG tablet   Commonly known as:  PRAVACHOL  Take 40 mg by mouth at bedtime.          [facility] Bunnie Pion, [place] 06/22/14 [phone]

## 2014-06-24 NOTE — ED Provider Notes (Signed)
Medical screening examination/treatment/procedure(s) were conducted as a shared visit with non-physician practitioner(s) and myself.  I personally evaluated the patient during the encounter.   EKG Interpretation None     Left wrist pain. Most likely gouty arthritis. Doubt septic joint.  Nat Christen, MD 06/24/14 [phone]

## 2014-07-05 DIAGNOSIS — M25532 Pain in left wrist: Secondary | ICD-10-CM | POA: Diagnosis not present

## 2014-07-05 DIAGNOSIS — M79641 Pain in right hand: Secondary | ICD-10-CM | POA: Diagnosis not present

## 2014-07-05 DIAGNOSIS — M109 Gout, unspecified: Secondary | ICD-10-CM | POA: Diagnosis not present

## 2014-08-03 DIAGNOSIS — N182 Chronic kidney disease, stage 2 (mild): Secondary | ICD-10-CM | POA: Diagnosis not present

## 2014-08-03 DIAGNOSIS — M79641 Pain in right hand: Secondary | ICD-10-CM | POA: Diagnosis not present

## 2014-08-03 DIAGNOSIS — R300 Dysuria: Secondary | ICD-10-CM | POA: Diagnosis not present

## 2014-08-03 DIAGNOSIS — E1165 Type 2 diabetes mellitus with hyperglycemia: Secondary | ICD-10-CM | POA: Diagnosis not present

## 2014-08-03 DIAGNOSIS — E559 Vitamin D deficiency, unspecified: Secondary | ICD-10-CM | POA: Diagnosis not present

## 2014-08-03 DIAGNOSIS — I10 Essential (primary) hypertension: Secondary | ICD-10-CM | POA: Diagnosis not present

## 2014-08-03 DIAGNOSIS — M109 Gout, unspecified: Secondary | ICD-10-CM | POA: Diagnosis not present

## 2014-08-03 DIAGNOSIS — M25532 Pain in left wrist: Secondary | ICD-10-CM | POA: Diagnosis not present

## 2014-08-03 DIAGNOSIS — E782 Mixed hyperlipidemia: Secondary | ICD-10-CM | POA: Diagnosis not present

## 2014-08-12 ENCOUNTER — Other Ambulatory Visit: Payer: Self-pay | Admitting: Cardiology

## 2014-08-17 DIAGNOSIS — M1000 Idiopathic gout, unspecified site: Secondary | ICD-10-CM | POA: Diagnosis not present

## 2014-08-17 DIAGNOSIS — M109 Gout, unspecified: Secondary | ICD-10-CM | POA: Diagnosis not present

## 2014-08-17 DIAGNOSIS — E1165 Type 2 diabetes mellitus with hyperglycemia: Secondary | ICD-10-CM | POA: Diagnosis not present

## 2014-08-23 DIAGNOSIS — Z1231 Encounter for screening mammogram for malignant neoplasm of breast: Secondary | ICD-10-CM | POA: Diagnosis not present

## 2014-10-05 DIAGNOSIS — E1165 Type 2 diabetes mellitus with hyperglycemia: Secondary | ICD-10-CM | POA: Diagnosis not present

## 2014-10-05 DIAGNOSIS — N182 Chronic kidney disease, stage 2 (mild): Secondary | ICD-10-CM | POA: Diagnosis not present

## 2014-10-05 DIAGNOSIS — I10 Essential (primary) hypertension: Secondary | ICD-10-CM | POA: Diagnosis not present

## 2014-10-05 DIAGNOSIS — E559 Vitamin D deficiency, unspecified: Secondary | ICD-10-CM | POA: Diagnosis not present

## 2014-10-05 DIAGNOSIS — E782 Mixed hyperlipidemia: Secondary | ICD-10-CM | POA: Diagnosis not present

## 2014-10-14 DIAGNOSIS — M5137 Other intervertebral disc degeneration, lumbosacral region: Secondary | ICD-10-CM | POA: Diagnosis not present

## 2014-10-14 DIAGNOSIS — Z6836 Body mass index (BMI) 36.0-36.9, adult: Secondary | ICD-10-CM | POA: Diagnosis not present

## 2014-10-14 DIAGNOSIS — I10 Essential (primary) hypertension: Secondary | ICD-10-CM | POA: Diagnosis not present

## 2014-10-14 DIAGNOSIS — M5416 Radiculopathy, lumbar region: Secondary | ICD-10-CM | POA: Diagnosis not present

## 2014-10-14 DIAGNOSIS — M4316 Spondylolisthesis, lumbar region: Secondary | ICD-10-CM | POA: Diagnosis not present

## 2014-10-21 DIAGNOSIS — M5416 Radiculopathy, lumbar region: Secondary | ICD-10-CM | POA: Diagnosis not present

## 2014-10-21 DIAGNOSIS — M4806 Spinal stenosis, lumbar region: Secondary | ICD-10-CM | POA: Diagnosis not present

## 2014-10-21 DIAGNOSIS — Z6837 Body mass index (BMI) 37.0-37.9, adult: Secondary | ICD-10-CM | POA: Diagnosis not present

## 2014-10-21 DIAGNOSIS — M5126 Other intervertebral disc displacement, lumbar region: Secondary | ICD-10-CM | POA: Diagnosis not present

## 2014-11-10 DIAGNOSIS — I10 Essential (primary) hypertension: Secondary | ICD-10-CM | POA: Diagnosis not present

## 2014-11-10 DIAGNOSIS — N182 Chronic kidney disease, stage 2 (mild): Secondary | ICD-10-CM | POA: Diagnosis not present

## 2014-11-10 DIAGNOSIS — Z0001 Encounter for general adult medical examination with abnormal findings: Secondary | ICD-10-CM | POA: Diagnosis not present

## 2014-11-10 DIAGNOSIS — E559 Vitamin D deficiency, unspecified: Secondary | ICD-10-CM | POA: Diagnosis not present

## 2014-11-10 DIAGNOSIS — E782 Mixed hyperlipidemia: Secondary | ICD-10-CM | POA: Diagnosis not present

## 2014-11-10 DIAGNOSIS — D2271 Melanocytic nevi of right lower limb, including hip: Secondary | ICD-10-CM | POA: Diagnosis not present

## 2014-11-10 DIAGNOSIS — E1165 Type 2 diabetes mellitus with hyperglycemia: Secondary | ICD-10-CM | POA: Diagnosis not present

## 2014-11-10 DIAGNOSIS — M1000 Idiopathic gout, unspecified site: Secondary | ICD-10-CM | POA: Diagnosis not present

## 2014-11-10 DIAGNOSIS — I248 Other forms of acute ischemic heart disease: Secondary | ICD-10-CM | POA: Diagnosis not present

## 2014-11-10 DIAGNOSIS — D2371 Other benign neoplasm of skin of right lower limb, including hip: Secondary | ICD-10-CM | POA: Diagnosis not present

## 2014-11-15 DIAGNOSIS — D2271 Melanocytic nevi of right lower limb, including hip: Secondary | ICD-10-CM | POA: Diagnosis not present

## 2015-01-18 ENCOUNTER — Encounter: Payer: Self-pay | Admitting: *Deleted

## 2015-01-18 ENCOUNTER — Ambulatory Visit (INDEPENDENT_AMBULATORY_CARE_PROVIDER_SITE_OTHER): Payer: Medicare Other | Admitting: Cardiology

## 2015-01-18 ENCOUNTER — Encounter: Payer: Self-pay | Admitting: Cardiology

## 2015-01-18 VITALS — BP 137/83 | HR 67 | Ht 63.0 in | Wt 210.0 lb

## 2015-01-18 DIAGNOSIS — E785 Hyperlipidemia, unspecified: Secondary | ICD-10-CM | POA: Diagnosis not present

## 2015-01-18 DIAGNOSIS — I10 Essential (primary) hypertension: Secondary | ICD-10-CM

## 2015-01-18 DIAGNOSIS — I2510 Atherosclerotic heart disease of native coronary artery without angina pectoris: Secondary | ICD-10-CM

## 2015-01-18 MED ORDER — AMLODIPINE BESYLATE 10 MG PO TABS: 10.0000 mg | ORAL_TABLET | Freq: Every day | ORAL | Status: DC

## 2015-01-18 NOTE — Progress Notes (Signed)
Clinical Summary [name] is a 69 y.o.female seen today for follow up of the following medical problems.  1. CAD  - CABG Jan 2013: 3 vessel (LIMA to LAD, SVG to OM, SVG to acute marginal).She had prior stenting as described below  - Jan 2013 echo LVEF 55-60%  - denies any chest pain. No SOB or DOE - compliant with meds  2. Hyperlipidemia  - on pravastatin, compliant  - 09/2011 TC 119 HDL 29 LDL 63 TG 137  - reports was on lipitor but was changed, she is not sure why    3. HTN  - does not check at home  - compliant with meds   Past Medical History  Diagnosis Date  . CAD (coronary artery disease)     Non-STEMI/Taxus stenting 100% left anterior descending (2), January 2008. Residual 70% circumflex;80% right coronary artery. Mild LVD (EF 45%);improved to 55-60% by 2-D echocardiogram,January 2009.   [name] Dyslipidemia     takes Pravastatin daily  . Obesity   . Lumbar herniated disc   . Gout     takes Allopurinol daily  . Unspecified essential hypertension     takes Metoprolol,Lisinopril,and Amlodipine  . Myocardial infarction 2008    pt has 2 stents  . Aortic stenosis   . AV block, 1st degree   . Arthritis   . Back pain     occasionally with weather changes  . Herniated disc     left  . Urinary frequency   . Nocturia   . DM (diabetes mellitus)     takes Metformin 1000mg  BID  . Early cataracts, bilateral      No Known Allergies   Current Outpatient Prescriptions  Medication Sig Dispense Refill  . allopurinol (ZYLOPRIM) 100 MG tablet Take 100 mg by mouth at bedtime.     [name] amLODipine (NORVASC) 5 MG tablet Take 1 tablet (5 mg total) by mouth daily. 90 tablet 3  . aspirin 81 MG tablet Take 81 mg by mouth at bedtime.     . colchicine 0.6 MG tablet Take one tablet PO and may repeat in one hour if no improvement. Then take one tablet every 3 hours until pain is relieved. 30 tablet 0  . lisinopril (PRINIVIL,ZESTRIL) 40 MG tablet Take 1 tablet (40 mg total) by  mouth daily. 90 tablet 3  . metFORMIN (GLUCOPHAGE) 500 MG tablet Take 500-1,000 mg by mouth 2 (two) times daily with a meal. Takes 500mg  in the morning and 1000mg  in the evening    . metoprolol tartrate (LOPRESSOR) 25 MG tablet TAKE 1 TABLET TWICE DAILY 180 tablet 3  . Multiple Vitamin (MULTIVITAMIN WITH MINERALS) TABS tablet Take 1 tablet by mouth daily.    . nitroGLYCERIN (NITROSTAT) 0.4 MG SL tablet Place 1 tablet (0.4 mg total) under the tongue every 5 (five) minutes as needed for chest pain. 25 tablet 3  . oxyCODONE-acetaminophen (PERCOCET/ROXICET) 5-325 MG per tablet Take 1-2 tablets by mouth every 4 (four) hours as needed for moderate pain. 60 tablet 0  . pravastatin (PRAVACHOL) 40 MG tablet Take 40 mg by mouth at bedtime.     . predniSONE (STERAPRED UNI-PAK) 10 MG tablet Take by mouth daily. Take 6 tablets PO day one, then 5, 4, 3, 2,1 21 tablet 0   No current facility-administered medications for this visit.     Past Surgical History  Procedure Laterality Date  . Back surgery  2009/2010/2011  . Carpal tunnel release  bilateral  . Cholecystectomy  not sure  . Breast biopsy  unknown    left  . Leg tendon surgery  unknown    left   . Abdominal hysterectomy    . Tonsillectomy    . Colonoscopy    . Coronary artery bypass graft  09/05/2011    Procedure: CORONARY ARTERY BYPASS GRAFTING (CABG);  Surgeon: Grace Isaac, MD;  Location: [facility];  Service: Open Heart Surgery;  Laterality: N/A;  CABG times three using left internal mammary artery and left leg greater saphenous vein harvested endoscopically  . Cardiac catheterization  2008    NON-STEMI/TAXUS STENTING 100% PROXIMAL LAD (X2) JANUARY 2008  . Cardiac catheterization  2012    occluded LAD stents, Om1 : 70%, RCA: 80%, Ef 40-45%  . Lumbar laminectomy/decompression microdiscectomy Left 06/04/2014    Procedure: LUMBAR LAMINECTOMY/DECOMPRESSION MICRODISCECTOMY LUMBAR FOUR-FIVE;  Surgeon: Elaina Hoops, MD;  Location: [facility];   Service: Neurosurgery;  Laterality: Left;     No Known Allergies    Family History  Problem Relation Age of Onset  . Heart attack Sister 27  . Heart attack Brother 28  . Anesthesia problems Neg Hx   . Hypotension Neg Hx   . Malignant hyperthermia Neg Hx   . Pseudochol deficiency Neg Hx      Social History Ms. Sheils reports that she has never smoked. She has never used smokeless tobacco. Ms. Falletta reports that she does not drink alcohol.   Review of Systems CONSTITUTIONAL: No weight loss, fever, chills, weakness or fatigue.  HEENT: Eyes: No visual loss, blurred vision, double vision or yellow sclerae.No hearing loss, sneezing, congestion, runny nose or sore throat.  SKIN: No rash or itching.  CARDIOVASCULAR: per HPI RESPIRATORY: No shortness of breath, cough or sputum.  GASTROINTESTINAL: No anorexia, nausea, vomiting or diarrhea. No abdominal pain or blood.  GENITOURINARY: No burning on urination, no polyuria NEUROLOGICAL: No headache, dizziness, syncope, paralysis, ataxia, numbness or tingling in the extremities. No change in bowel or bladder control.  MUSCULOSKELETAL: No muscle, back pain, joint pain or stiffness.  LYMPHATICS: No enlarged nodes. No history of splenectomy.  PSYCHIATRIC: No history of depression or anxiety.  ENDOCRINOLOGIC: No reports of sweating, cold or heat intolerance. No polyuria or polydipsia.  [name]   Physical Examination Filed Vitals:   01/18/15 0950  BP: 137/83  Pulse: 67   Filed Vitals:   01/18/15 0950  Height: 5\' 3"  (1.6 m)  Weight: 210 lb (95.255 kg)    Gen: resting comfortably, no acute distress HEENT: no scleral icterus, pupils equal round and reactive, no palptable cervical adenopathy,  CV: RRR, no m/r/g, no JVD Resp: Clear to auscultation bilaterally GI: abdomen is soft, non-tender, non-distended, normal bowel sounds, no hepatosplenomegaly MSK: extremities are warm, no edema.  Skin: warm, no rash Neuro:  no focal  deficits Psych: appropriate affect   Diagnostic Studies Jan 2013  LVEF 82-95%, diastolic dysfunction,      Assessment and Plan  1. CAD  - status post CABG, denies any current symptoms  - continue secondary prevention and risk factor modification  2. HTN  - not at goal given her hx of DM2, will increase norvasc to 10mg  daily.   3. Hyperlipidemia  - continue pravastatin, reports being switched from lipitor in the past, unclear if she had reaction to more potent statin.  - will request pcp notes and labs  F/u 1 year      Arnoldo Lenis, M.D.

## 2015-01-18 NOTE — Patient Instructions (Signed)
Your physician wants you to follow-up in:  [place] DR. BRANCH. You will receive a reminder letter in the mail two months in advance. If you don't receive a letter, please call our office to schedule the follow-up appointment. ALL   Your physician has recommended you make the following change in your medication:    INCREASE AMLODIPINE 10MG  DAILY  CONTINUE ALL OTHER MEDICATIONS AS DIRECTED   WE WILL REQUEST LABS & NOTES FROM  PCP   Thank you for choosing [facility]!!

## 2015-03-08 DIAGNOSIS — Z6837 Body mass index (BMI) 37.0-37.9, adult: Secondary | ICD-10-CM | POA: Diagnosis not present

## 2015-03-08 DIAGNOSIS — M5126 Other intervertebral disc displacement, lumbar region: Secondary | ICD-10-CM | POA: Diagnosis not present

## 2015-04-07 DIAGNOSIS — M5126 Other intervertebral disc displacement, lumbar region: Secondary | ICD-10-CM | POA: Diagnosis not present

## 2015-04-07 DIAGNOSIS — M5416 Radiculopathy, lumbar region: Secondary | ICD-10-CM | POA: Diagnosis not present

## 2015-04-28 DIAGNOSIS — M5137 Other intervertebral disc degeneration, lumbosacral region: Secondary | ICD-10-CM | POA: Diagnosis not present

## 2015-05-11 DIAGNOSIS — I10 Essential (primary) hypertension: Secondary | ICD-10-CM | POA: Diagnosis not present

## 2015-05-11 DIAGNOSIS — E1165 Type 2 diabetes mellitus with hyperglycemia: Secondary | ICD-10-CM | POA: Diagnosis not present

## 2015-05-11 DIAGNOSIS — E782 Mixed hyperlipidemia: Secondary | ICD-10-CM | POA: Diagnosis not present

## 2015-05-11 DIAGNOSIS — N182 Chronic kidney disease, stage 2 (mild): Secondary | ICD-10-CM | POA: Diagnosis not present

## 2015-05-17 DIAGNOSIS — I10 Essential (primary) hypertension: Secondary | ICD-10-CM | POA: Diagnosis not present

## 2015-05-17 DIAGNOSIS — M109 Gout, unspecified: Secondary | ICD-10-CM | POA: Diagnosis not present

## 2015-05-17 DIAGNOSIS — E1165 Type 2 diabetes mellitus with hyperglycemia: Secondary | ICD-10-CM | POA: Diagnosis not present

## 2015-05-17 DIAGNOSIS — N182 Chronic kidney disease, stage 2 (mild): Secondary | ICD-10-CM | POA: Diagnosis not present

## 2015-05-17 DIAGNOSIS — Z23 Encounter for immunization: Secondary | ICD-10-CM | POA: Diagnosis not present

## 2015-05-17 DIAGNOSIS — Z955 Presence of coronary angioplasty implant and graft: Secondary | ICD-10-CM | POA: Diagnosis not present

## 2015-05-17 DIAGNOSIS — E782 Mixed hyperlipidemia: Secondary | ICD-10-CM | POA: Diagnosis not present

## 2015-05-17 DIAGNOSIS — I2510 Atherosclerotic heart disease of native coronary artery without angina pectoris: Secondary | ICD-10-CM | POA: Diagnosis not present

## 2015-05-17 DIAGNOSIS — Z6837 Body mass index (BMI) 37.0-37.9, adult: Secondary | ICD-10-CM | POA: Diagnosis not present

## 2015-05-17 DIAGNOSIS — D2371 Other benign neoplasm of skin of right lower limb, including hip: Secondary | ICD-10-CM | POA: Diagnosis not present

## 2015-05-26 DIAGNOSIS — M5416 Radiculopathy, lumbar region: Secondary | ICD-10-CM | POA: Diagnosis not present

## 2015-05-26 DIAGNOSIS — M5137 Other intervertebral disc degeneration, lumbosacral region: Secondary | ICD-10-CM | POA: Diagnosis not present

## 2015-06-28 ENCOUNTER — Other Ambulatory Visit: Payer: Self-pay | Admitting: Cardiology

## 2015-07-12 DIAGNOSIS — M5137 Other intervertebral disc degeneration, lumbosacral region: Secondary | ICD-10-CM | POA: Diagnosis not present

## 2015-07-26 DIAGNOSIS — R300 Dysuria: Secondary | ICD-10-CM | POA: Diagnosis not present

## 2015-08-04 DIAGNOSIS — M5137 Other intervertebral disc degeneration, lumbosacral region: Secondary | ICD-10-CM | POA: Diagnosis not present

## 2015-08-04 DIAGNOSIS — M5416 Radiculopathy, lumbar region: Secondary | ICD-10-CM | POA: Diagnosis not present

## 2015-08-12 ENCOUNTER — Other Ambulatory Visit: Payer: Self-pay | Admitting: Cardiology

## 2015-08-25 DIAGNOSIS — Z1231 Encounter for screening mammogram for malignant neoplasm of breast: Secondary | ICD-10-CM | POA: Diagnosis not present

## 2015-08-30 DIAGNOSIS — N816 Rectocele: Secondary | ICD-10-CM | POA: Diagnosis not present

## 2015-08-30 DIAGNOSIS — N8110 Cystocele, unspecified: Secondary | ICD-10-CM | POA: Diagnosis not present

## 2015-09-27 DIAGNOSIS — N8111 Cystocele, midline: Secondary | ICD-10-CM | POA: Diagnosis not present

## 2015-10-05 DIAGNOSIS — N8110 Cystocele, unspecified: Secondary | ICD-10-CM | POA: Diagnosis not present

## 2015-10-05 DIAGNOSIS — J019 Acute sinusitis, unspecified: Secondary | ICD-10-CM | POA: Diagnosis not present

## 2015-10-05 DIAGNOSIS — E1165 Type 2 diabetes mellitus with hyperglycemia: Secondary | ICD-10-CM | POA: Diagnosis not present

## 2015-10-05 DIAGNOSIS — I2510 Atherosclerotic heart disease of native coronary artery without angina pectoris: Secondary | ICD-10-CM | POA: Diagnosis not present

## 2015-10-05 DIAGNOSIS — I248 Other forms of acute ischemic heart disease: Secondary | ICD-10-CM | POA: Diagnosis not present

## 2015-10-05 DIAGNOSIS — N182 Chronic kidney disease, stage 2 (mild): Secondary | ICD-10-CM | POA: Diagnosis not present

## 2015-10-05 DIAGNOSIS — M1000 Idiopathic gout, unspecified site: Secondary | ICD-10-CM | POA: Diagnosis not present

## 2015-10-05 DIAGNOSIS — I10 Essential (primary) hypertension: Secondary | ICD-10-CM | POA: Diagnosis not present

## 2015-10-05 DIAGNOSIS — E782 Mixed hyperlipidemia: Secondary | ICD-10-CM | POA: Diagnosis not present

## 2015-10-18 ENCOUNTER — Telehealth: Payer: Self-pay | Admitting: Cardiology

## 2015-10-18 NOTE — Telephone Encounter (Signed)
Mrs. Mamo called stating that she is scheduled for surgery on 11/14/15 with Dr. Adah Perl.

## 2015-10-18 NOTE — Telephone Encounter (Signed)
Pt says that Dr. Adah Perl requesting cardiac clearance for "bladder surgery" 11/14/15. Called Dr Sande Rives office for clarification ([place])  pt is having anterior repair with vault suspension 11/14/15. Will forward to Dr. Harl Bowie

## 2015-10-19 NOTE — Telephone Encounter (Signed)
Our last visit was several months ago, can we get her an appointment this week to talk about her surgery. Looks like some openings are on Friday   Zandra Abts MD

## 2015-10-21 ENCOUNTER — Ambulatory Visit (INDEPENDENT_AMBULATORY_CARE_PROVIDER_SITE_OTHER): Payer: Medicare Other | Admitting: Cardiology

## 2015-10-21 ENCOUNTER — Encounter: Payer: Self-pay | Admitting: Cardiology

## 2015-10-21 VITALS — BP 140/83 | HR 70 | Ht 63.0 in | Wt 211.8 lb

## 2015-10-21 DIAGNOSIS — I2510 Atherosclerotic heart disease of native coronary artery without angina pectoris: Secondary | ICD-10-CM | POA: Diagnosis not present

## 2015-10-21 DIAGNOSIS — I10 Essential (primary) hypertension: Secondary | ICD-10-CM

## 2015-10-21 DIAGNOSIS — E785 Hyperlipidemia, unspecified: Secondary | ICD-10-CM | POA: Diagnosis not present

## 2015-10-21 DIAGNOSIS — Z01818 Encounter for other preprocedural examination: Secondary | ICD-10-CM | POA: Diagnosis not present

## 2015-10-21 NOTE — Progress Notes (Addendum)
Patient ID: [name], female   DOB: 1945-09-16, 70 y.o.   MRN: [id number]     Clinical Summary [name] is a 70 y.o.female seen today for follow up of the following medical problems.   1. CAD  - CABG Jan 2013: 3 vessel (LIMA to LAD, SVG to OM, SVG to acute marginal).She had prior stenting as described below  - Jan 2013 echo LVEF 55-60%   - denies any chest pain or SOB. Walks up a flight of stairs regularly at home without troubles.  - compliant with meds.   2. Hyperlipidemia  - on pravastatin, compliant  - 09/2014: TC 156 TG 158 HDL 40 LDL 84 - she reports she was on lipitor in the past, however it was changed for unclear reasons.    3. HTN  - does not check at home  - compliant with meds   4. Preoperative evaluation - upcoming bladder surgery  Past Medical History  Diagnosis Date  . CAD (coronary artery disease)     Non-STEMI/Taxus stenting 100% left anterior descending (2), January 2008. Residual 70% circumflex;80% right coronary artery. Mild LVD (EF 45%);improved to 55-60% by 2-D echocardiogram,January 2009.   [name] Dyslipidemia     takes Pravastatin daily  . Obesity   . Lumbar herniated disc   . Gout     takes Allopurinol daily  . Unspecified essential hypertension     takes Metoprolol,Lisinopril,and Amlodipine  . Myocardial infarction [facility]) 2008    pt has 2 stents  . Aortic stenosis   . AV block, 1st degree   . Arthritis   . Back pain     occasionally with weather changes  . Herniated disc     left  . Urinary frequency   . Nocturia   . DM (diabetes mellitus)     takes Metformin 1000mg  BID  . Early cataracts, bilateral      No Known Allergies   Current Outpatient Prescriptions  Medication Sig Dispense Refill  . allopurinol (ZYLOPRIM) 100 MG tablet Take 100 mg by mouth at bedtime.     [name] amLODipine (NORVASC) 10 MG tablet Take 1 tablet (10 mg total) by mouth daily. 90 tablet 3  . aspirin 81 MG tablet Take 81 mg by mouth at bedtime.     .  colchicine 0.6 MG tablet Take one tablet PO and may repeat in one hour if no improvement. Then take one tablet every 3 hours until pain is relieved. 30 tablet 0  . lisinopril (PRINIVIL,ZESTRIL) 40 MG tablet Take 1 tablet (40 mg total) by mouth daily. 90 tablet 3  . metFORMIN (GLUCOPHAGE) 500 MG tablet Take 1,000 mg by mouth 2 (two) times daily with a meal.     . metoprolol tartrate (LOPRESSOR) 25 MG tablet TAKE 1 TABLET TWICE DAILY 180 tablet 3  . Multiple Vitamin (MULTIVITAMIN WITH MINERALS) TABS tablet Take 1 tablet by mouth daily.    . nitroGLYCERIN (NITROSTAT) 0.4 MG SL tablet PLACE 1 TABLET  UNDER THE TONGUE EVERY 5 MINUTES AS NEEDED FOR CHEST PAIN. 25 tablet 3  . oxyCODONE-acetaminophen (PERCOCET/ROXICET) 5-325 MG per tablet Take 1-2 tablets by mouth every 4 (four) hours as needed for moderate pain. 60 tablet 0  . pravastatin (PRAVACHOL) 40 MG tablet Take 40 mg by mouth at bedtime.      No current facility-administered medications for this visit.     Past Surgical History  Procedure Laterality Date  . Back surgery  2009/2010/2011  . Carpal tunnel release  bilateral  . Cholecystectomy  not sure  . Breast biopsy  unknown    left  . Leg tendon surgery  unknown    left   . Abdominal hysterectomy    . Tonsillectomy    . Colonoscopy    . Coronary artery bypass graft  09/05/2011    Procedure: CORONARY ARTERY BYPASS GRAFTING (CABG);  Surgeon: Grace Isaac, MD;  Location: [facility];  Service: Open Heart Surgery;  Laterality: N/A;  CABG times three using left internal mammary artery and left leg greater saphenous vein harvested endoscopically  . Cardiac catheterization  2008    NON-STEMI/TAXUS STENTING 100% PROXIMAL LAD (X2) JANUARY 2008  . Cardiac catheterization  2012    occluded LAD stents, Om1 : 70%, RCA: 80%, Ef 40-45%  . Lumbar laminectomy/decompression microdiscectomy Left 06/04/2014    Procedure: LUMBAR LAMINECTOMY/DECOMPRESSION MICRODISCECTOMY LUMBAR FOUR-FIVE;  Surgeon: Elaina Hoops, MD;  Location: [facility];  Service: Neurosurgery;  Laterality: Left;     No Known Allergies    Family History  Problem Relation Age of Onset  . Heart attack Sister 35  . Heart attack Brother 59  . Anesthesia problems Neg Hx   . Hypotension Neg Hx   . Malignant hyperthermia Neg Hx   . Pseudochol deficiency Neg Hx      Social History [name] reports that she has never smoked. She has never used smokeless tobacco. [name] reports that she does not drink alcohol.   Review of Systems CONSTITUTIONAL: No weight loss, fever, chills, weakness or fatigue.  HEENT: Eyes: No visual loss, blurred vision, double vision or yellow sclerae.No hearing loss, sneezing, congestion, runny nose or sore throat.  SKIN: No rash or itching.  CARDIOVASCULAR: per hpi RESPIRATORY: No shortness of breath, cough or sputum.  GASTROINTESTINAL: No anorexia, nausea, vomiting or diarrhea. No abdominal pain or blood.  GENITOURINARY: No burning on urination, no polyuria NEUROLOGICAL: No headache, dizziness, syncope, paralysis, ataxia, numbness or tingling in the extremities. No change in bowel or bladder control.  MUSCULOSKELETAL: No muscle, back pain, joint pain or stiffness.  LYMPHATICS: No enlarged nodes. No history of splenectomy.  PSYCHIATRIC: No history of depression or anxiety.  ENDOCRINOLOGIC: No reports of sweating, cold or heat intolerance. No polyuria or polydipsia.  [name]   Physical Examination Filed Vitals:   10/21/15 0822  BP: 140/83  Pulse: 70   Filed Vitals:   10/21/15 0822  Height: 5\' 3"  (1.6 m)  Weight: 211 lb 12.8 oz (96.072 kg)    Gen: resting comfortably, no acute distress HEENT: no scleral icterus, pupils equal round and reactive, no palptable cervical adenopathy,  CV: RRR, no m/r/g, no jvd Resp: Clear to auscultation bilaterally GI: abdomen is soft, non-tender, non-distended, normal bowel sounds, no hepatosplenomegaly MSK: extremities are warm, no edema.  Skin:  warm, no rash Neuro:  no focal deficits Psych: appropriate affect   Diagnostic Studies Jan 2013  LVEF 0000000, diastolic dysfunction,   [id number] EKG (performed and reviewed in clinic): NSR  Assessment and Plan  1. CAD  - status post CABG, denies any current symptoms  - continue current meds  2. HTN  - at goal, continue current meds   3. Hyperlipidemia  - continue pravastatin, reports being switched from lipitor in the past, unclear if she had reaction to more potent statin.  - will current current meds  4. Preoperative evaluation - she is being considered for bladder surgery. She has not active acute cardiac conditions. She tolerates greater  than 4 METs regularly without limitation. Recommend proceeding with surgery as planned.   F/u 1 year      Arnoldo Lenis, M.D.

## 2015-10-21 NOTE — Patient Instructions (Addendum)
Your physician wants you to follow-up in: 1 year with Dr. Branch  You will receive a reminder letter in the mail two months in advance. If you don't receive a letter, please call our office to schedule the follow-up appointment.  Your physician recommends that you continue on your current medications as directed. Please refer to the Current Medication list given to you today.  Thank you for choosing [facility]!!   

## 2015-11-07 ENCOUNTER — Ambulatory Visit: Payer: No Typology Code available for payment source | Admitting: Cardiology

## 2015-11-11 DIAGNOSIS — I119 Hypertensive heart disease without heart failure: Secondary | ICD-10-CM | POA: Diagnosis not present

## 2015-11-11 DIAGNOSIS — E119 Type 2 diabetes mellitus without complications: Secondary | ICD-10-CM | POA: Diagnosis not present

## 2015-11-11 DIAGNOSIS — Z79899 Other long term (current) drug therapy: Secondary | ICD-10-CM | POA: Diagnosis not present

## 2015-11-11 DIAGNOSIS — N8110 Cystocele, unspecified: Secondary | ICD-10-CM | POA: Diagnosis not present

## 2015-11-14 DIAGNOSIS — I10 Essential (primary) hypertension: Secondary | ICD-10-CM | POA: Diagnosis not present

## 2015-11-14 DIAGNOSIS — I119 Hypertensive heart disease without heart failure: Secondary | ICD-10-CM | POA: Diagnosis not present

## 2015-11-14 DIAGNOSIS — N8111 Cystocele, midline: Secondary | ICD-10-CM | POA: Diagnosis not present

## 2015-11-14 DIAGNOSIS — E119 Type 2 diabetes mellitus without complications: Secondary | ICD-10-CM | POA: Diagnosis not present

## 2015-11-14 DIAGNOSIS — E785 Hyperlipidemia, unspecified: Secondary | ICD-10-CM | POA: Diagnosis not present

## 2015-11-14 DIAGNOSIS — N8110 Cystocele, unspecified: Secondary | ICD-10-CM | POA: Diagnosis not present

## 2015-11-14 DIAGNOSIS — Z79899 Other long term (current) drug therapy: Secondary | ICD-10-CM | POA: Diagnosis not present

## 2015-11-15 DIAGNOSIS — E119 Type 2 diabetes mellitus without complications: Secondary | ICD-10-CM | POA: Diagnosis not present

## 2015-11-15 DIAGNOSIS — N8110 Cystocele, unspecified: Secondary | ICD-10-CM | POA: Diagnosis not present

## 2015-11-15 DIAGNOSIS — Z79899 Other long term (current) drug therapy: Secondary | ICD-10-CM | POA: Diagnosis not present

## 2015-11-15 DIAGNOSIS — I119 Hypertensive heart disease without heart failure: Secondary | ICD-10-CM | POA: Diagnosis not present

## 2015-12-12 DIAGNOSIS — I10 Essential (primary) hypertension: Secondary | ICD-10-CM | POA: Diagnosis not present

## 2015-12-12 DIAGNOSIS — E782 Mixed hyperlipidemia: Secondary | ICD-10-CM | POA: Diagnosis not present

## 2015-12-12 DIAGNOSIS — E1165 Type 2 diabetes mellitus with hyperglycemia: Secondary | ICD-10-CM | POA: Diagnosis not present

## 2015-12-12 DIAGNOSIS — N182 Chronic kidney disease, stage 2 (mild): Secondary | ICD-10-CM | POA: Diagnosis not present

## 2015-12-15 ENCOUNTER — Other Ambulatory Visit: Payer: Self-pay | Admitting: Cardiology

## 2015-12-15 DIAGNOSIS — M545 Low back pain: Secondary | ICD-10-CM | POA: Diagnosis not present

## 2015-12-15 DIAGNOSIS — Z0001 Encounter for general adult medical examination with abnormal findings: Secondary | ICD-10-CM | POA: Diagnosis not present

## 2015-12-15 DIAGNOSIS — M5417 Radiculopathy, lumbosacral region: Secondary | ICD-10-CM | POA: Diagnosis not present

## 2015-12-15 DIAGNOSIS — Z23 Encounter for immunization: Secondary | ICD-10-CM | POA: Diagnosis not present

## 2015-12-15 DIAGNOSIS — Z1389 Encounter for screening for other disorder: Secondary | ICD-10-CM | POA: Diagnosis not present

## 2015-12-15 DIAGNOSIS — I10 Essential (primary) hypertension: Secondary | ICD-10-CM | POA: Diagnosis not present

## 2015-12-15 DIAGNOSIS — E782 Mixed hyperlipidemia: Secondary | ICD-10-CM | POA: Diagnosis not present

## 2015-12-15 DIAGNOSIS — E1165 Type 2 diabetes mellitus with hyperglycemia: Secondary | ICD-10-CM | POA: Diagnosis not present

## 2015-12-20 DIAGNOSIS — M6281 Muscle weakness (generalized): Secondary | ICD-10-CM | POA: Diagnosis not present

## 2015-12-20 DIAGNOSIS — M5441 Lumbago with sciatica, right side: Secondary | ICD-10-CM | POA: Diagnosis not present

## 2015-12-22 DIAGNOSIS — M5441 Lumbago with sciatica, right side: Secondary | ICD-10-CM | POA: Diagnosis not present

## 2015-12-22 DIAGNOSIS — M6281 Muscle weakness (generalized): Secondary | ICD-10-CM | POA: Diagnosis not present

## 2015-12-26 DIAGNOSIS — M6281 Muscle weakness (generalized): Secondary | ICD-10-CM | POA: Diagnosis not present

## 2015-12-26 DIAGNOSIS — M5431 Sciatica, right side: Secondary | ICD-10-CM | POA: Diagnosis not present

## 2015-12-29 DIAGNOSIS — M6281 Muscle weakness (generalized): Secondary | ICD-10-CM | POA: Diagnosis not present

## 2015-12-29 DIAGNOSIS — M5431 Sciatica, right side: Secondary | ICD-10-CM | POA: Diagnosis not present

## 2016-01-12 DIAGNOSIS — M5431 Sciatica, right side: Secondary | ICD-10-CM | POA: Diagnosis not present

## 2016-01-12 DIAGNOSIS — M6281 Muscle weakness (generalized): Secondary | ICD-10-CM | POA: Diagnosis not present

## 2016-01-17 DIAGNOSIS — M5431 Sciatica, right side: Secondary | ICD-10-CM | POA: Diagnosis not present

## 2016-01-17 DIAGNOSIS — H0012 Chalazion right lower eyelid: Secondary | ICD-10-CM | POA: Diagnosis not present

## 2016-01-17 DIAGNOSIS — M6281 Muscle weakness (generalized): Secondary | ICD-10-CM | POA: Diagnosis not present

## 2016-01-18 ENCOUNTER — Ambulatory Visit: Payer: No Typology Code available for payment source | Admitting: Cardiology

## 2016-01-19 DIAGNOSIS — M5431 Sciatica, right side: Secondary | ICD-10-CM | POA: Diagnosis not present

## 2016-01-19 DIAGNOSIS — M6281 Muscle weakness (generalized): Secondary | ICD-10-CM | POA: Diagnosis not present

## 2016-01-24 DIAGNOSIS — M6281 Muscle weakness (generalized): Secondary | ICD-10-CM | POA: Diagnosis not present

## 2016-01-24 DIAGNOSIS — M5431 Sciatica, right side: Secondary | ICD-10-CM | POA: Diagnosis not present

## 2016-01-26 DIAGNOSIS — M6281 Muscle weakness (generalized): Secondary | ICD-10-CM | POA: Diagnosis not present

## 2016-01-26 DIAGNOSIS — M5441 Lumbago with sciatica, right side: Secondary | ICD-10-CM | POA: Diagnosis not present

## 2016-01-31 DIAGNOSIS — M6281 Muscle weakness (generalized): Secondary | ICD-10-CM | POA: Diagnosis not present

## 2016-01-31 DIAGNOSIS — M5441 Lumbago with sciatica, right side: Secondary | ICD-10-CM | POA: Diagnosis not present

## 2016-02-07 DIAGNOSIS — M5441 Lumbago with sciatica, right side: Secondary | ICD-10-CM | POA: Diagnosis not present

## 2016-02-07 DIAGNOSIS — M6281 Muscle weakness (generalized): Secondary | ICD-10-CM | POA: Diagnosis not present

## 2016-02-09 DIAGNOSIS — M6281 Muscle weakness (generalized): Secondary | ICD-10-CM | POA: Diagnosis not present

## 2016-02-09 DIAGNOSIS — M5441 Lumbago with sciatica, right side: Secondary | ICD-10-CM | POA: Diagnosis not present

## 2016-02-14 DIAGNOSIS — Z6837 Body mass index (BMI) 37.0-37.9, adult: Secondary | ICD-10-CM | POA: Diagnosis not present

## 2016-02-14 DIAGNOSIS — M5137 Other intervertebral disc degeneration, lumbosacral region: Secondary | ICD-10-CM | POA: Diagnosis not present

## 2016-02-14 DIAGNOSIS — I10 Essential (primary) hypertension: Secondary | ICD-10-CM | POA: Diagnosis not present

## 2016-02-15 DIAGNOSIS — M6281 Muscle weakness (generalized): Secondary | ICD-10-CM | POA: Diagnosis not present

## 2016-02-15 DIAGNOSIS — M5441 Lumbago with sciatica, right side: Secondary | ICD-10-CM | POA: Diagnosis not present

## 2016-02-16 DIAGNOSIS — M5441 Lumbago with sciatica, right side: Secondary | ICD-10-CM | POA: Diagnosis not present

## 2016-02-16 DIAGNOSIS — M6281 Muscle weakness (generalized): Secondary | ICD-10-CM | POA: Diagnosis not present

## 2016-02-21 DIAGNOSIS — M6281 Muscle weakness (generalized): Secondary | ICD-10-CM | POA: Diagnosis not present

## 2016-02-21 DIAGNOSIS — M5441 Lumbago with sciatica, right side: Secondary | ICD-10-CM | POA: Diagnosis not present

## 2016-02-23 DIAGNOSIS — M6281 Muscle weakness (generalized): Secondary | ICD-10-CM | POA: Diagnosis not present

## 2016-02-23 DIAGNOSIS — M5441 Lumbago with sciatica, right side: Secondary | ICD-10-CM | POA: Diagnosis not present

## 2016-03-01 DIAGNOSIS — M5431 Sciatica, right side: Secondary | ICD-10-CM | POA: Diagnosis not present

## 2016-03-01 DIAGNOSIS — M6281 Muscle weakness (generalized): Secondary | ICD-10-CM | POA: Diagnosis not present

## 2016-03-02 DIAGNOSIS — M5431 Sciatica, right side: Secondary | ICD-10-CM | POA: Diagnosis not present

## 2016-03-02 DIAGNOSIS — M6281 Muscle weakness (generalized): Secondary | ICD-10-CM | POA: Diagnosis not present

## 2016-03-07 DIAGNOSIS — M6281 Muscle weakness (generalized): Secondary | ICD-10-CM | POA: Diagnosis not present

## 2016-03-07 DIAGNOSIS — M5431 Sciatica, right side: Secondary | ICD-10-CM | POA: Diagnosis not present

## 2016-03-08 DIAGNOSIS — M5431 Sciatica, right side: Secondary | ICD-10-CM | POA: Diagnosis not present

## 2016-03-08 DIAGNOSIS — M6281 Muscle weakness (generalized): Secondary | ICD-10-CM | POA: Diagnosis not present

## 2016-03-12 DIAGNOSIS — M5137 Other intervertebral disc degeneration, lumbosacral region: Secondary | ICD-10-CM | POA: Diagnosis not present

## 2016-03-12 DIAGNOSIS — M5416 Radiculopathy, lumbar region: Secondary | ICD-10-CM | POA: Diagnosis not present

## 2016-04-09 DIAGNOSIS — M5137 Other intervertebral disc degeneration, lumbosacral region: Secondary | ICD-10-CM | POA: Diagnosis not present

## 2016-04-09 DIAGNOSIS — Z6837 Body mass index (BMI) 37.0-37.9, adult: Secondary | ICD-10-CM | POA: Diagnosis not present

## 2016-04-09 DIAGNOSIS — M5416 Radiculopathy, lumbar region: Secondary | ICD-10-CM | POA: Diagnosis not present

## 2016-05-01 ENCOUNTER — Other Ambulatory Visit: Payer: Self-pay

## 2016-06-11 DIAGNOSIS — I10 Essential (primary) hypertension: Secondary | ICD-10-CM | POA: Diagnosis not present

## 2016-06-11 DIAGNOSIS — E782 Mixed hyperlipidemia: Secondary | ICD-10-CM | POA: Diagnosis not present

## 2016-06-11 DIAGNOSIS — E1165 Type 2 diabetes mellitus with hyperglycemia: Secondary | ICD-10-CM | POA: Diagnosis not present

## 2016-06-13 DIAGNOSIS — E782 Mixed hyperlipidemia: Secondary | ICD-10-CM | POA: Diagnosis not present

## 2016-06-13 DIAGNOSIS — E119 Type 2 diabetes mellitus without complications: Secondary | ICD-10-CM | POA: Diagnosis not present

## 2016-06-13 DIAGNOSIS — E1122 Type 2 diabetes mellitus with diabetic chronic kidney disease: Secondary | ICD-10-CM | POA: Diagnosis not present

## 2016-06-13 DIAGNOSIS — Z6838 Body mass index (BMI) 38.0-38.9, adult: Secondary | ICD-10-CM | POA: Diagnosis not present

## 2016-06-13 DIAGNOSIS — E1165 Type 2 diabetes mellitus with hyperglycemia: Secondary | ICD-10-CM | POA: Diagnosis not present

## 2016-06-13 DIAGNOSIS — M1000 Idiopathic gout, unspecified site: Secondary | ICD-10-CM | POA: Diagnosis not present

## 2016-06-13 DIAGNOSIS — E559 Vitamin D deficiency, unspecified: Secondary | ICD-10-CM | POA: Diagnosis not present

## 2016-06-13 DIAGNOSIS — H25013 Cortical age-related cataract, bilateral: Secondary | ICD-10-CM | POA: Diagnosis not present

## 2016-06-13 DIAGNOSIS — I2510 Atherosclerotic heart disease of native coronary artery without angina pectoris: Secondary | ICD-10-CM | POA: Diagnosis not present

## 2016-06-13 DIAGNOSIS — I10 Essential (primary) hypertension: Secondary | ICD-10-CM | POA: Diagnosis not present

## 2016-06-13 DIAGNOSIS — H538 Other visual disturbances: Secondary | ICD-10-CM | POA: Diagnosis not present

## 2016-06-16 ENCOUNTER — Other Ambulatory Visit: Payer: Self-pay | Admitting: Cardiology

## 2016-06-20 DIAGNOSIS — Z6838 Body mass index (BMI) 38.0-38.9, adult: Secondary | ICD-10-CM | POA: Diagnosis not present

## 2016-06-20 DIAGNOSIS — M5126 Other intervertebral disc displacement, lumbar region: Secondary | ICD-10-CM | POA: Diagnosis not present

## 2016-06-20 DIAGNOSIS — M5416 Radiculopathy, lumbar region: Secondary | ICD-10-CM | POA: Diagnosis not present

## 2016-06-20 DIAGNOSIS — M5137 Other intervertebral disc degeneration, lumbosacral region: Secondary | ICD-10-CM | POA: Diagnosis not present

## 2016-06-20 DIAGNOSIS — I10 Essential (primary) hypertension: Secondary | ICD-10-CM | POA: Diagnosis not present

## 2016-08-22 ENCOUNTER — Other Ambulatory Visit: Payer: Self-pay | Admitting: Cardiology

## 2016-08-28 DIAGNOSIS — Z1231 Encounter for screening mammogram for malignant neoplasm of breast: Secondary | ICD-10-CM | POA: Diagnosis not present

## 2016-10-23 ENCOUNTER — Encounter: Payer: Self-pay | Admitting: *Deleted

## 2016-10-24 ENCOUNTER — Ambulatory Visit (INDEPENDENT_AMBULATORY_CARE_PROVIDER_SITE_OTHER): Payer: Medicare HMO | Admitting: Cardiology

## 2016-10-24 ENCOUNTER — Encounter: Payer: Self-pay | Admitting: Cardiology

## 2016-10-24 VITALS — BP 118/74 | HR 76 | Ht 63.0 in | Wt 214.0 lb

## 2016-10-24 DIAGNOSIS — I10 Essential (primary) hypertension: Secondary | ICD-10-CM | POA: Diagnosis not present

## 2016-10-24 DIAGNOSIS — I2510 Atherosclerotic heart disease of native coronary artery without angina pectoris: Secondary | ICD-10-CM | POA: Diagnosis not present

## 2016-10-24 DIAGNOSIS — E782 Mixed hyperlipidemia: Secondary | ICD-10-CM

## 2016-10-24 NOTE — Patient Instructions (Signed)

## 2016-10-24 NOTE — Progress Notes (Signed)
Clinical Summary [name] is a 71 y.o.female seen today for follow up of the following medical problems.   1. CAD  - CABG Jan 2013: 3 vessel (LIMA to LAD, SVG to OM, SVG to acute marginal).She had prior stenting as described below  - Jan 2013 echo LVEF 55-60%     - occasional chest pain. Sharp pain unde rright breast to epigastric area, 2-3/10. Occurs mainly with activity. No other associated symptoms. Lasts few minutes. Not positional. Started 3-4 months ago. No relation to food.  -no SOB or DOE.  - compliant with meds  2. Hyperlipidemia  - on pravastatin, compliant  - 09/2014: TC 156 TG 158 HDL 40 LDL 84 - she reports she was on lipitor in the past, however it was changed for unclear reasons.   - upcoming labs with pcp 11/2016.    3. HTN  - does not check bp at home  - compliant with meds    Past Medical History:  Diagnosis Date  . Aortic stenosis   . Arthritis   . AV block, 1st degree   . Back pain    occasionally with weather changes  . CAD (coronary artery disease)    Non-STEMI/Taxus stenting 100% left anterior descending (2), January 2008. Residual 70% circumflex;80% right coronary artery. Mild LVD (EF 45%);improved to 55-60% by 2-D echocardiogram,January 2009.   . DM (diabetes mellitus) ([place])    takes Metformin 1000mg  BID  . Dyslipidemia    takes Pravastatin daily  . Early cataracts, bilateral   . Gout    takes Allopurinol daily  . Herniated disc    left  . Lumbar herniated disc   . Myocardial infarction 2008   pt has 2 stents  . Nocturia   . Obesity   . Unspecified essential hypertension    takes Metoprolol,Lisinopril,and Amlodipine  . Urinary frequency      No Known Allergies   Current Outpatient Prescriptions  Medication Sig Dispense Refill  . allopurinol (ZYLOPRIM) 100 MG tablet Take 100 mg by mouth at bedtime.     [name] amLODipine (NORVASC) 10 MG tablet TAKE 1 TABLET EVERY DAY  *DOSE  INCREASE* 90 tablet 3  . aspirin 81 MG  tablet Take 81 mg by mouth at bedtime.     . colchicine 0.6 MG tablet Take one tablet PO and may repeat in one hour if no improvement. Then take one tablet every 3 hours until pain is relieved. 30 tablet 0  . lisinopril (PRINIVIL,ZESTRIL) 40 MG tablet Take 1 tablet (40 mg total) by mouth daily. 90 tablet 3  . metFORMIN (GLUCOPHAGE) 500 MG tablet Take 1,000 mg by mouth 2 (two) times daily with a meal.     . metoprolol tartrate (LOPRESSOR) 25 MG tablet TAKE 1 TABLET TWICE DAILY 180 tablet 3  . Multiple Vitamin (MULTIVITAMIN WITH MINERALS) TABS tablet Take 1 tablet by mouth daily.    . nitroGLYCERIN (NITROSTAT) 0.4 MG SL tablet PLACE 1 TABLET  UNDER THE TONGUE EVERY 5 MINUTES AS NEEDED FOR CHEST PAIN. 25 tablet 3  . oxyCODONE-acetaminophen (PERCOCET/ROXICET) 5-325 MG per tablet Take 1-2 tablets by mouth every 4 (four) hours as needed for moderate pain. 60 tablet 0  . pravastatin (PRAVACHOL) 40 MG tablet Take 40 mg by mouth at bedtime.      No current facility-administered medications for this visit.      Past Surgical History:  Procedure Laterality Date  . ABDOMINAL HYSTERECTOMY    . BACK SURGERY  2009/2010/2011  .  BREAST BIOPSY  unknown   left  . CARDIAC CATHETERIZATION  2008   NON-STEMI/TAXUS STENTING 100% PROXIMAL LAD (X2) JANUARY 2008  . CARDIAC CATHETERIZATION  2012   occluded LAD stents, Om1 : 70%, RCA: 80%, Ef 40-45%  . CARPAL TUNNEL RELEASE     bilateral  . CHOLECYSTECTOMY  not sure  . COLONOSCOPY    . CORONARY ARTERY BYPASS GRAFT  09/05/2011   Procedure: CORONARY ARTERY BYPASS GRAFTING (CABG);  Surgeon: Grace Isaac, MD;  Location: [facility];  Service: Open Heart Surgery;  Laterality: N/A;  CABG times three using left internal mammary artery and left leg greater saphenous vein harvested endoscopically  . LEG TENDON SURGERY  unknown   left   . LUMBAR LAMINECTOMY/DECOMPRESSION MICRODISCECTOMY Left 06/04/2014   Procedure: LUMBAR LAMINECTOMY/DECOMPRESSION MICRODISCECTOMY LUMBAR  FOUR-FIVE;  Surgeon: Elaina Hoops, MD;  Location: [facility];  Service: Neurosurgery;  Laterality: Left;  . TONSILLECTOMY       No Known Allergies    Family History  Problem Relation Age of Onset  . Heart attack Sister 57  . Heart attack Brother 8  . Anesthesia problems Neg Hx   . Hypotension Neg Hx   . Malignant hyperthermia Neg Hx   . Pseudochol deficiency Neg Hx      Social History Ms. Lococo reports that she has never smoked. She has never used smokeless tobacco. Ms. Ohrt reports that she does not drink alcohol.   Review of Systems CONSTITUTIONAL: No weight loss, fever, chills, weakness or fatigue.  HEENT: Eyes: No visual loss, blurred vision, double vision or yellow sclerae.No hearing loss, sneezing, congestion, runny nose or sore throat.  SKIN: No rash or itching.  CARDIOVASCULAR: per HPI RESPIRATORY: No shortness of breath, cough or sputum.  GASTROINTESTINAL: No anorexia, nausea, vomiting or diarrhea. No abdominal pain or blood.  GENITOURINARY: No burning on urination, no polyuria NEUROLOGICAL: No headache, dizziness, syncope, paralysis, ataxia, numbness or tingling in the extremities. No change in bowel or bladder control.  MUSCULOSKELETAL: No muscle, back pain, joint pain or stiffness.  LYMPHATICS: No enlarged nodes. No history of splenectomy.  PSYCHIATRIC: No history of depression or anxiety.  ENDOCRINOLOGIC: No reports of sweating, cold or heat intolerance. No polyuria or polydipsia.  [name]   Physical Examination Vitals:   10/24/16 1305  BP: 118/74  Pulse: 76   Vitals:   10/24/16 1305  Weight: 214 lb (97.1 kg)  Height: 5\' 3"  (1.6 m)    Gen: resting comfortably, no acute distress HEENT: no scleral icterus, pupils equal round and reactive, no palptable cervical adenopathy,  CV: RRR, 2/6 systolic murmur RUSB, no jvd Resp: Clear to auscultation bilaterally GI: abdomen is soft, non-tender, non-distended, normal bowel sounds, no hepatosplenomegaly MSK:  extremities are warm, no edema.  Skin: warm, no rash Neuro:  no focal deficits Psych: appropriate affect   Diagnostic Studies Jan 2013  LVEF 0000000, diastolic dysfunction,     Assessment and Plan   1. CAD  - status post CABG.  - recent atypical chest pain symptoms. EKG in clinic SR without acute ischemic changes. Continue to monitor at this time - continue current meds  2. HTN  - bps is at goal, continue current meds   3. Hyperlipidemia  - continue pravastatin, reports being switched from lipitor in the past, unclear if she had reaction to more potent statin.  - f/u upclming labs.     F/u 1 year     Arnoldo Lenis, M.D.

## 2016-12-13 DIAGNOSIS — I2510 Atherosclerotic heart disease of native coronary artery without angina pectoris: Secondary | ICD-10-CM | POA: Diagnosis not present

## 2016-12-13 DIAGNOSIS — N182 Chronic kidney disease, stage 2 (mild): Secondary | ICD-10-CM | POA: Diagnosis not present

## 2016-12-13 DIAGNOSIS — E559 Vitamin D deficiency, unspecified: Secondary | ICD-10-CM | POA: Diagnosis not present

## 2016-12-13 DIAGNOSIS — I10 Essential (primary) hypertension: Secondary | ICD-10-CM | POA: Diagnosis not present

## 2016-12-13 DIAGNOSIS — E782 Mixed hyperlipidemia: Secondary | ICD-10-CM | POA: Diagnosis not present

## 2016-12-13 DIAGNOSIS — E1165 Type 2 diabetes mellitus with hyperglycemia: Secondary | ICD-10-CM | POA: Diagnosis not present

## 2016-12-13 DIAGNOSIS — E1122 Type 2 diabetes mellitus with diabetic chronic kidney disease: Secondary | ICD-10-CM | POA: Diagnosis not present

## 2016-12-17 DIAGNOSIS — E1122 Type 2 diabetes mellitus with diabetic chronic kidney disease: Secondary | ICD-10-CM | POA: Diagnosis not present

## 2016-12-17 DIAGNOSIS — Z6837 Body mass index (BMI) 37.0-37.9, adult: Secondary | ICD-10-CM | POA: Diagnosis not present

## 2016-12-17 DIAGNOSIS — E1165 Type 2 diabetes mellitus with hyperglycemia: Secondary | ICD-10-CM | POA: Diagnosis not present

## 2016-12-17 DIAGNOSIS — E782 Mixed hyperlipidemia: Secondary | ICD-10-CM | POA: Diagnosis not present

## 2016-12-17 DIAGNOSIS — I10 Essential (primary) hypertension: Secondary | ICD-10-CM | POA: Diagnosis not present

## 2016-12-17 DIAGNOSIS — Z0001 Encounter for general adult medical examination with abnormal findings: Secondary | ICD-10-CM | POA: Diagnosis not present

## 2016-12-17 DIAGNOSIS — M1000 Idiopathic gout, unspecified site: Secondary | ICD-10-CM | POA: Diagnosis not present

## 2016-12-17 DIAGNOSIS — Z23 Encounter for immunization: Secondary | ICD-10-CM | POA: Diagnosis not present

## 2017-01-03 DIAGNOSIS — Z6837 Body mass index (BMI) 37.0-37.9, adult: Secondary | ICD-10-CM | POA: Diagnosis not present

## 2017-01-03 DIAGNOSIS — I739 Peripheral vascular disease, unspecified: Secondary | ICD-10-CM | POA: Diagnosis not present

## 2017-01-07 ENCOUNTER — Other Ambulatory Visit: Payer: Self-pay | Admitting: Vascular Surgery

## 2017-01-17 ENCOUNTER — Other Ambulatory Visit: Payer: Self-pay | Admitting: *Deleted

## 2017-01-17 DIAGNOSIS — Z6838 Body mass index (BMI) 38.0-38.9, adult: Secondary | ICD-10-CM | POA: Diagnosis not present

## 2017-01-17 DIAGNOSIS — I739 Peripheral vascular disease, unspecified: Secondary | ICD-10-CM | POA: Diagnosis not present

## 2017-01-17 DIAGNOSIS — I10 Essential (primary) hypertension: Secondary | ICD-10-CM | POA: Diagnosis not present

## 2017-01-17 DIAGNOSIS — I6523 Occlusion and stenosis of bilateral carotid arteries: Secondary | ICD-10-CM | POA: Diagnosis not present

## 2017-01-17 DIAGNOSIS — R202 Paresthesia of skin: Secondary | ICD-10-CM

## 2017-01-17 DIAGNOSIS — M19071 Primary osteoarthritis, right ankle and foot: Secondary | ICD-10-CM | POA: Diagnosis not present

## 2017-02-14 ENCOUNTER — Encounter: Payer: Self-pay | Admitting: Vascular Surgery

## 2017-02-28 ENCOUNTER — Ambulatory Visit (HOSPITAL_COMMUNITY)
Admission: RE | Admit: 2017-02-28 | Discharge: 2017-02-28 | Disposition: A | Discharge status: Home / Self Care | Payer: Medicare HMO | Source: Ambulatory Visit | Attending: Vascular Surgery | Admitting: Vascular Surgery

## 2017-02-28 ENCOUNTER — Ambulatory Visit (INDEPENDENT_AMBULATORY_CARE_PROVIDER_SITE_OTHER): Payer: Medicare HMO | Admitting: Vascular Surgery

## 2017-02-28 ENCOUNTER — Encounter: Payer: Self-pay | Admitting: Vascular Surgery

## 2017-02-28 VITALS — BP 137/79 | HR 70 | Temp 97.1°F | Resp 20 | Ht 63.0 in | Wt 216.0 lb

## 2017-02-28 DIAGNOSIS — I739 Peripheral vascular disease, unspecified: Secondary | ICD-10-CM

## 2017-02-28 DIAGNOSIS — R202 Paresthesia of skin: Secondary | ICD-10-CM | POA: Insufficient documentation

## 2017-02-28 DIAGNOSIS — I6523 Occlusion and stenosis of bilateral carotid arteries: Secondary | ICD-10-CM | POA: Diagnosis not present

## 2017-02-28 DIAGNOSIS — R0989 Other specified symptoms and signs involving the circulatory and respiratory systems: Secondary | ICD-10-CM | POA: Insufficient documentation

## 2017-02-28 LAB — VAS US LOWER EXTREMITY ARTERIAL DUPLEX
LEFT PERO DIST SYS: -23 cm/s
Left ant tibial distal sys: 70 cm/s
Left super femoral dist sys PSV: -79 cm/s
Left super femoral mid sys PSV: -114 cm/s
Left super femoral prox sys PSV: 141 cm/s
RIGHT ANT DIST TIBAL SYS PSV: 101 cm/s
Right peroneal sys PSV: 51 cm/s
Right super femoral dist sys PSV: -84 cm/s
Right super femoral mid sys PSV: -135 cm/s
Right super femoral prox sys PSV: -109 cm/s

## 2017-02-28 NOTE — Progress Notes (Signed)
Referring Physician: Dr Dalia Heading  Patient name: [name] MRN: [id number] DOB: 1946-07-08 Sex: female  REASON FOR CONSULT: abnormal ABIs with leg pain  HPI: [name] is a 71 y.o. female, referred by Dr. Pleas Koch for evaluation of leg pain with abnormal ABIs. The patient states she gets burning and numbness in the anterior portion of each calf after walking about a quarter mile. She does not really describe calf pain. She denies rest pain in the foot. She does not have a history of nonhealing wounds. She does have a history of coronary artery disease and had coronary bypass grafting in 2013. Bilateral greater saphenous vein was harvested for that procedure. Of note in review of her records she also had a moderate carotid stenosis at the time of her evaluation for coronary bypass grafting. She has not had a follow-up carotid duplex scan since then. She does deny any symptoms of TIA amaurosis or stroke. She is on aspirin and a statin. Other chronic medical problems include chronic back pain, arthritis, diabetes, hyperlipidemia, obesity, hypertension or which have been stable.  Past Medical History:  Diagnosis Date  . Aortic stenosis   . Arthritis   . AV block, 1st degree   . Back pain    occasionally with weather changes  . CAD (coronary artery disease)    Non-STEMI/Taxus stenting 100% left anterior descending (2), January 2008. Residual 70% circumflex;80% right coronary artery. Mild LVD (EF 45%);improved to 55-60% by 2-D echocardiogram,January 2009.   . DM (diabetes mellitus) ([place])    takes Metformin 1000mg  BID  . Dyslipidemia    takes Pravastatin daily  . Early cataracts, bilateral   . Gout    takes Allopurinol daily  . Herniated disc    left  . Lumbar herniated disc   . Myocardial infarction [facility]) 2008   pt has 2 stents  . Nocturia   . Obesity   . Unspecified essential hypertension    takes Metoprolol,Lisinopril,and Amlodipine  . Urinary frequency    Past  Surgical History:  Procedure Laterality Date  . ABDOMINAL HYSTERECTOMY    . BACK SURGERY  2009/2010/2011  . BREAST BIOPSY  unknown   left  . CARDIAC CATHETERIZATION  2008   NON-STEMI/TAXUS STENTING 100% PROXIMAL LAD (X2) JANUARY 2008  . CARDIAC CATHETERIZATION  2012   occluded LAD stents, Om1 : 70%, RCA: 80%, Ef 40-45%  . CARPAL TUNNEL RELEASE     bilateral  . CHOLECYSTECTOMY  not sure  . COLONOSCOPY    . CORONARY ARTERY BYPASS GRAFT  09/05/2011   Procedure: CORONARY ARTERY BYPASS GRAFTING (CABG);  Surgeon: Grace Isaac, MD;  Location: [place];  Service: Open Heart Surgery;  Laterality: N/A;  CABG times three using left internal mammary artery and left leg greater saphenous vein harvested endoscopically  . LEG TENDON SURGERY  unknown   left   . LUMBAR LAMINECTOMY/DECOMPRESSION MICRODISCECTOMY Left 06/04/2014   Procedure: LUMBAR LAMINECTOMY/DECOMPRESSION MICRODISCECTOMY LUMBAR FOUR-FIVE;  Surgeon: Elaina Hoops, MD;  Location: [place] [facility];  Service: Neurosurgery;  Laterality: Left;  . TONSILLECTOMY      Family History  Problem Relation Age of Onset  . Heart attack Sister 72  . Heart attack Brother 36  . Anesthesia problems Neg Hx   . Hypotension Neg Hx   . Malignant hyperthermia Neg Hx   . Pseudochol deficiency Neg Hx     SOCIAL HISTORY: Social History   Social History  . Marital status: Widowed    Spouse  name: N/A  . Number of children: N/A  . Years of education: N/A   Occupational History  . Not on file.   Social History Main Topics  . Smoking status: Never Smoker  . Smokeless tobacco: Never Used  . Alcohol use No  . Drug use: No  . Sexual activity: Yes   Other Topics Concern  . Not on file   Social History Narrative  . No narrative on file    No Known Allergies  Current Outpatient Prescriptions  Medication Sig Dispense Refill  . allopurinol (ZYLOPRIM) 100 MG tablet Take 100 mg by mouth at bedtime.     [name] amLODipine (NORVASC) 10 MG tablet TAKE 1 TABLET  EVERY DAY  *DOSE  INCREASE* 90 tablet 3  . aspirin 81 MG tablet Take 81 mg by mouth at bedtime.     . colchicine 0.6 MG tablet Take one tablet PO and may repeat in one hour if no improvement. Then take one tablet every 3 hours until pain is relieved. 30 tablet 0  . lisinopril (PRINIVIL,ZESTRIL) 40 MG tablet Take 1 tablet (40 mg total) by mouth daily. 90 tablet 3  . metFORMIN (GLUCOPHAGE) 500 MG tablet Take 500 mg by mouth 2 (two) times daily with a meal.     . metoprolol tartrate (LOPRESSOR) 25 MG tablet TAKE 1 TABLET TWICE DAILY 180 tablet 3  . Multiple Vitamin (MULTIVITAMIN WITH MINERALS) TABS tablet Take 1 tablet by mouth daily.    . nitroGLYCERIN (NITROSTAT) 0.4 MG SL tablet PLACE 1 TABLET  UNDER THE TONGUE EVERY 5 MINUTES AS NEEDED FOR CHEST PAIN. 25 tablet 3  . pravastatin (PRAVACHOL) 40 MG tablet Take 40 mg by mouth at bedtime.     [name] oxyCODONE-acetaminophen (PERCOCET/ROXICET) 5-325 MG per tablet Take 1-2 tablets by mouth every 4 (four) hours as needed for moderate pain. (Patient not taking: Reported on 02/28/2017) 60 tablet 0   No current facility-administered medications for this visit.     ROS:   General:  No weight loss, Fever, chills  HEENT: No recent headaches, no nasal bleeding, no visual changes, no sore throat  Neurologic: No dizziness, blackouts, seizures. No recent symptoms of stroke or mini- stroke. No recent episodes of slurred speech, or temporary blindness.  Cardiac: No recent episodes of chest pain/pressure, no shortness of breath at rest.  +shortness of breath with exertion.  Denies history of atrial fibrillation or irregular heartbeat  Vascular: No history of rest pain in feet.  No history of claudication.  No history of non-healing ulcer, No history of DVT   Pulmonary: No home oxygen, no productive cough, no hemoptysis,  No asthma or wheezing  Musculoskeletal:  [X]  Arthritis, [X]  Low back pain,  [X]  Joint pain  Hematologic:No history of hypercoagulable state.  No  history of easy bleeding.  No history of anemia  Gastrointestinal: No hematochezia or melena,  No gastroesophageal reflux, no trouble swallowing  Urinary: [ ]  chronic Kidney disease, [ ]  on HD - [ ]  MWF or [ ]  TTHS, [ ]  Burning with urination, [ ]  Frequent urination, [ ]  Difficulty urinating;   Skin: No rashes  Psychological: No history of anxiety,  No history of depression   Physical Examination  Vitals:   02/28/17 1330  BP: 137/79  Pulse: 70  Resp: 20  Temp: (!) 97.1 F (36.2 C)  TempSrc: Oral  SpO2: 99%  Weight: 216 lb (98 kg)  Height: 5\' 3"  (1.6 m)    Body mass index is 38.26 kg/m.  General:  Alert and oriented, no acute distress HEENT: Normal Neck: No bruit or JVD Pulmonary: Clear to auscultation bilaterally Cardiac: Regular Rate and Rhythm without murmur Abdomen: Soft, non-tender, non-distended, no mass, Obese  Skin: No rash Extremity Pulses:  2+ radial, brachial, difficult to palpate femoral possibly secondary to obesity, absent dorsalis pedis, posterior tibial pulses bilaterally Musculoskeletal: No deformity or edema  Neurologic: Upper and lower extremity motor 5/5 and symmetric  DATA:  I reviewed the patient's bilateral ABIs from no on 12/29/2016 which were 0.88 on the right 0.71 on the left. Patient had a arterial duplex exam in our office today which showed triphasic waveforms on the right side and triphasic waveforms above the knee on the left side. She did have biphasic waveforms of the left popliteal level and monophasic at the tibial level suggesting some element of popliteal artery stenosis on the left side. She also had occlusion of the posterior tibial artery bilaterally by duplex.  ASSESSMENT:  #1 patient was some element of peripheral arterial disease with decreased ABIs bilaterally. She does have some difficulty walking but some of her symptoms could be secondary to her back pain rather than peripheral arterial disease. She may also have an element of  neuropathy. Overall she does not seem very debilitated by this. I discussed with her the possibility of an arteriogram for further evaluation. However she does not really feel that limited by her symptoms and is okay for right now with continued conservative management and observation. We will repeat her ABIs in one year. If she develops worsening leg pain symptoms or nonhealing wound she will return sooner. She will see our nurse practitioner at that office visit.  #2 carotid occlusive disease patient had a moderate carotid stenosis by duplex ultrasound 2013. Although she has remained asymptomatic she probably warrants at least a repeat carotid duplex exam to make sure that this has not progressed. We will obtain this at her next office visit in 6 months as well. She will continue to take her aspirin and statin.   PLAN:  See above   Ruta Hinds, MD [facility] Office: [phone] Pager: [phone]

## 2017-03-22 NOTE — Addendum Note (Signed)
Addended by: Lianne Cure A on: 03/22/2017 04:03 PM   Modules accepted: Orders

## 2017-03-26 ENCOUNTER — Other Ambulatory Visit: Payer: Self-pay | Admitting: Cardiology

## 2017-03-26 MED ORDER — METOPROLOL TARTRATE 25 MG PO TABS: 25.0000 mg | ORAL_TABLET | Freq: Two times a day (BID) | ORAL | 1 refills | Status: DC

## 2017-03-26 NOTE — Telephone Encounter (Signed)
Patient needs refill on metoprolol tartrate (LOPRESSOR) 25 MG tablet [[id number]]  She has had to [place], [place] (home)

## 2017-03-26 NOTE — Telephone Encounter (Signed)
Medication sent to [facility].

## 2017-06-11 DIAGNOSIS — M1000 Idiopathic gout, unspecified site: Secondary | ICD-10-CM | POA: Diagnosis not present

## 2017-06-11 DIAGNOSIS — E782 Mixed hyperlipidemia: Secondary | ICD-10-CM | POA: Diagnosis not present

## 2017-06-11 DIAGNOSIS — E1122 Type 2 diabetes mellitus with diabetic chronic kidney disease: Secondary | ICD-10-CM | POA: Diagnosis not present

## 2017-06-11 DIAGNOSIS — I10 Essential (primary) hypertension: Secondary | ICD-10-CM | POA: Diagnosis not present

## 2017-06-11 DIAGNOSIS — E559 Vitamin D deficiency, unspecified: Secondary | ICD-10-CM | POA: Diagnosis not present

## 2017-06-11 DIAGNOSIS — E1165 Type 2 diabetes mellitus with hyperglycemia: Secondary | ICD-10-CM | POA: Diagnosis not present

## 2017-06-11 DIAGNOSIS — N182 Chronic kidney disease, stage 2 (mild): Secondary | ICD-10-CM | POA: Diagnosis not present

## 2017-06-14 DIAGNOSIS — E782 Mixed hyperlipidemia: Secondary | ICD-10-CM | POA: Diagnosis not present

## 2017-06-14 DIAGNOSIS — Z6838 Body mass index (BMI) 38.0-38.9, adult: Secondary | ICD-10-CM | POA: Diagnosis not present

## 2017-06-14 DIAGNOSIS — Z1389 Encounter for screening for other disorder: Secondary | ICD-10-CM | POA: Diagnosis not present

## 2017-06-14 DIAGNOSIS — I10 Essential (primary) hypertension: Secondary | ICD-10-CM | POA: Diagnosis not present

## 2017-06-14 DIAGNOSIS — E559 Vitamin D deficiency, unspecified: Secondary | ICD-10-CM | POA: Diagnosis not present

## 2017-06-14 DIAGNOSIS — E1122 Type 2 diabetes mellitus with diabetic chronic kidney disease: Secondary | ICD-10-CM | POA: Diagnosis not present

## 2017-06-14 DIAGNOSIS — Z23 Encounter for immunization: Secondary | ICD-10-CM | POA: Diagnosis not present

## 2017-06-14 DIAGNOSIS — E1165 Type 2 diabetes mellitus with hyperglycemia: Secondary | ICD-10-CM | POA: Diagnosis not present

## 2017-07-09 DIAGNOSIS — E2839 Other primary ovarian failure: Secondary | ICD-10-CM | POA: Diagnosis not present

## 2017-07-09 DIAGNOSIS — M810 Age-related osteoporosis without current pathological fracture: Secondary | ICD-10-CM | POA: Diagnosis not present

## 2017-07-11 DIAGNOSIS — H18413 Arcus senilis, bilateral: Secondary | ICD-10-CM | POA: Diagnosis not present

## 2017-07-11 DIAGNOSIS — E119 Type 2 diabetes mellitus without complications: Secondary | ICD-10-CM | POA: Diagnosis not present

## 2017-07-11 DIAGNOSIS — H11133 Conjunctival pigmentations, bilateral: Secondary | ICD-10-CM | POA: Diagnosis not present

## 2017-07-11 DIAGNOSIS — Z7984 Long term (current) use of oral hypoglycemic drugs: Secondary | ICD-10-CM | POA: Diagnosis not present

## 2017-07-11 DIAGNOSIS — H52223 Regular astigmatism, bilateral: Secondary | ICD-10-CM | POA: Diagnosis not present

## 2017-07-11 DIAGNOSIS — H524 Presbyopia: Secondary | ICD-10-CM | POA: Diagnosis not present

## 2017-07-11 DIAGNOSIS — H11153 Pinguecula, bilateral: Secondary | ICD-10-CM | POA: Diagnosis not present

## 2017-07-11 DIAGNOSIS — H3540 Unspecified peripheral retinal degeneration: Secondary | ICD-10-CM | POA: Diagnosis not present

## 2017-07-11 DIAGNOSIS — H35033 Hypertensive retinopathy, bilateral: Secondary | ICD-10-CM | POA: Diagnosis not present

## 2017-07-11 DIAGNOSIS — I10 Essential (primary) hypertension: Secondary | ICD-10-CM | POA: Diagnosis not present

## 2017-08-14 DIAGNOSIS — K08409 Partial loss of teeth, unspecified cause, unspecified class: Secondary | ICD-10-CM | POA: Diagnosis not present

## 2017-08-14 DIAGNOSIS — E7800 Pure hypercholesterolemia, unspecified: Secondary | ICD-10-CM | POA: Diagnosis not present

## 2017-08-14 DIAGNOSIS — Z0000 Encounter for general adult medical examination without abnormal findings: Secondary | ICD-10-CM | POA: Diagnosis not present

## 2017-08-14 DIAGNOSIS — I10 Essential (primary) hypertension: Secondary | ICD-10-CM | POA: Diagnosis not present

## 2017-08-14 DIAGNOSIS — Z7982 Long term (current) use of aspirin: Secondary | ICD-10-CM | POA: Diagnosis not present

## 2017-08-14 DIAGNOSIS — I209 Angina pectoris, unspecified: Secondary | ICD-10-CM | POA: Diagnosis not present

## 2017-08-14 DIAGNOSIS — M1A9XX0 Chronic gout, unspecified, without tophus (tophi): Secondary | ICD-10-CM | POA: Diagnosis not present

## 2017-08-14 DIAGNOSIS — Z7984 Long term (current) use of oral hypoglycemic drugs: Secondary | ICD-10-CM | POA: Diagnosis not present

## 2017-08-14 DIAGNOSIS — E119 Type 2 diabetes mellitus without complications: Secondary | ICD-10-CM | POA: Diagnosis not present

## 2017-08-28 ENCOUNTER — Emergency Department (HOSPITAL_COMMUNITY): Payer: Medicare HMO

## 2017-08-28 ENCOUNTER — Encounter (HOSPITAL_COMMUNITY): Payer: Self-pay | Admitting: Cardiology

## 2017-08-28 ENCOUNTER — Emergency Department (HOSPITAL_COMMUNITY)
Admission: EM | Admit: 2017-08-28 | Discharge: 2017-08-28 | Disposition: A | Discharge status: Home / Self Care | Payer: Medicare HMO | Attending: Emergency Medicine | Admitting: Emergency Medicine

## 2017-08-28 DIAGNOSIS — S4992XA Unspecified injury of left shoulder and upper arm, initial encounter: Secondary | ICD-10-CM | POA: Diagnosis not present

## 2017-08-28 DIAGNOSIS — E119 Type 2 diabetes mellitus without complications: Secondary | ICD-10-CM | POA: Diagnosis not present

## 2017-08-28 DIAGNOSIS — M79602 Pain in left arm: Secondary | ICD-10-CM | POA: Insufficient documentation

## 2017-08-28 DIAGNOSIS — S4991XA Unspecified injury of right shoulder and upper arm, initial encounter: Secondary | ICD-10-CM | POA: Diagnosis not present

## 2017-08-28 DIAGNOSIS — I2510 Atherosclerotic heart disease of native coronary artery without angina pectoris: Secondary | ICD-10-CM | POA: Insufficient documentation

## 2017-08-28 DIAGNOSIS — Z7984 Long term (current) use of oral hypoglycemic drugs: Secondary | ICD-10-CM | POA: Insufficient documentation

## 2017-08-28 DIAGNOSIS — S098XXA Other specified injuries of head, initial encounter: Secondary | ICD-10-CM | POA: Insufficient documentation

## 2017-08-28 DIAGNOSIS — Z79899 Other long term (current) drug therapy: Secondary | ICD-10-CM | POA: Diagnosis not present

## 2017-08-28 DIAGNOSIS — Y998 Other external cause status: Secondary | ICD-10-CM | POA: Diagnosis not present

## 2017-08-28 DIAGNOSIS — S199XXA Unspecified injury of neck, initial encounter: Secondary | ICD-10-CM | POA: Diagnosis not present

## 2017-08-28 DIAGNOSIS — I10 Essential (primary) hypertension: Secondary | ICD-10-CM | POA: Diagnosis not present

## 2017-08-28 DIAGNOSIS — S59912A Unspecified injury of left forearm, initial encounter: Secondary | ICD-10-CM | POA: Diagnosis not present

## 2017-08-28 DIAGNOSIS — M79605 Pain in left leg: Secondary | ICD-10-CM | POA: Insufficient documentation

## 2017-08-28 DIAGNOSIS — S8992XA Unspecified injury of left lower leg, initial encounter: Secondary | ICD-10-CM | POA: Diagnosis not present

## 2017-08-28 DIAGNOSIS — Y9389 Activity, other specified: Secondary | ICD-10-CM | POA: Diagnosis not present

## 2017-08-28 DIAGNOSIS — M79601 Pain in right arm: Secondary | ICD-10-CM | POA: Diagnosis not present

## 2017-08-28 DIAGNOSIS — Y9289 Other specified places as the place of occurrence of the external cause: Secondary | ICD-10-CM | POA: Diagnosis not present

## 2017-08-28 DIAGNOSIS — S0990XA Unspecified injury of head, initial encounter: Secondary | ICD-10-CM

## 2017-08-28 DIAGNOSIS — S59911A Unspecified injury of right forearm, initial encounter: Secondary | ICD-10-CM | POA: Diagnosis not present

## 2017-08-28 DIAGNOSIS — S0081XA Abrasion of other part of head, initial encounter: Secondary | ICD-10-CM | POA: Insufficient documentation

## 2017-08-28 DIAGNOSIS — W01198A Fall on same level from slipping, tripping and stumbling with subsequent striking against other object, initial encounter: Secondary | ICD-10-CM | POA: Diagnosis not present

## 2017-08-28 DIAGNOSIS — M79662 Pain in left lower leg: Secondary | ICD-10-CM | POA: Diagnosis not present

## 2017-08-28 DIAGNOSIS — S0083XA Contusion of other part of head, initial encounter: Secondary | ICD-10-CM | POA: Diagnosis not present

## 2017-08-28 DIAGNOSIS — W19XXXA Unspecified fall, initial encounter: Secondary | ICD-10-CM

## 2017-08-28 MED ORDER — HYDROCODONE-ACETAMINOPHEN 5-325 MG PO TABS
2.0000 | ORAL_TABLET | Freq: Once | ORAL | Status: AC
  Administered 2017-08-28: 2 via ORAL
  Filled 2017-08-28: qty 2

## 2017-08-28 NOTE — Discharge Instructions (Signed)
Wash the scrapes on your face each day with soap and water.  Watch for signs of infection signs of infection include more pain, drainage from the wound, redness around the wounds or fever.  If you think you might be developing an infection, see your doctor or an urgent care center or return if concern for any reason.  Take Tylenol as every 4 hours as needed for pain.  You can hold an ice pack over painful areas 4 times daily for 30 minutes at a time which will help with pain as well.  If your pain is not well controlled, see your primary care physician.

## 2017-08-28 NOTE — ED Triage Notes (Signed)
[place] and tripped over a box in her bedroom this morning.  C/o head, bilateral arm pain, and right lower leg pain.  Pt states she landed face first.  Abrasions and swelling  to nose and  forehead.

## 2017-08-28 NOTE — ED Provider Notes (Signed)
[facility] EMERGENCY DEPARTMENT Provider Note   CSN: [id number] Arrival date & time: 08/28/17  4696     History   Chief Complaint No chief complaint on file. Chief complaint fall, bilateral arm pain and left leg pain  HPI JAYLEY HUSTEAD is a 72 y.o. female.  HPI Patient tripped and fell 7 AM today she was not using her cane.  She landed on her face.  She complains of bilateral upper arm pain and bilateral forearm pain and left shin pain since the event.  She denies neck pain or back pain denies shortness of breath chest pain or abdominal pain.  No treatment prior to coming here.  She has been ambulatory since the event.  Her son was able to pick her up off the floor after she lay on the floor for 10-20 minutes.  No treatment prior to coming here.  Pain is constant.  No other associated symptoms.  She did strike her face causing facial abrasions as result of the event.  Last tetanus immunization less than 10 years ago Past Medical History:  Diagnosis Date  . Aortic stenosis   . Arthritis   . AV block, 1st degree   . Back pain    occasionally with weather changes  . CAD (coronary artery disease)    Non-STEMI/Taxus stenting 100% left anterior descending (2), January 2008. Residual 70% circumflex;80% right coronary artery. Mild LVD (EF 45%);improved to 55-60% by 2-D echocardiogram,January 2009.   . DM (diabetes mellitus) ([place])    takes Metformin 1000mg  BID  . Dyslipidemia    takes Pravastatin daily  . Early cataracts, bilateral   . Gout    takes Allopurinol daily  . Herniated disc    left  . Lumbar herniated disc   . Myocardial infarction [facility]) 2008   pt has 2 stents  . Nocturia   . Obesity   . Unspecified essential hypertension    takes Metoprolol,Lisinopril,and Amlodipine  . Urinary frequency     Patient Active Problem List   Diagnosis Date Noted  . HNP (herniated nucleus pulposus), lumbar 06/04/2014  . Overweight(278.02) 12/03/2011  . S/P CABG (coronary artery  bypass graft) 09/05/2011  . Essential hypertension   . DM 08/24/2009  . DYSLIPIDEMIA 08/24/2009  . CORONARY ARTERY DISEASE, S/P PTCA 08/24/2009  . LAMINECTOMY, HX OF 08/24/2009    Past Surgical History:  Procedure Laterality Date  . ABDOMINAL HYSTERECTOMY    . BACK SURGERY  2009/2010/2011  . BREAST BIOPSY  unknown   left  . CARDIAC CATHETERIZATION  2008   NON-STEMI/TAXUS STENTING 100% PROXIMAL LAD (X2) JANUARY 2008  . CARDIAC CATHETERIZATION  2012   occluded LAD stents, Om1 : 70%, RCA: 80%, Ef 40-45%  . CARPAL TUNNEL RELEASE     bilateral  . CHOLECYSTECTOMY  not sure  . COLONOSCOPY    . CORONARY ARTERY BYPASS GRAFT  09/05/2011   Procedure: CORONARY ARTERY BYPASS GRAFTING (CABG);  Surgeon: Grace Isaac, MD;  Location: [place];  Service: Open Heart Surgery;  Laterality: N/A;  CABG times three using left internal mammary artery and left leg greater saphenous vein harvested endoscopically  . LEG TENDON SURGERY  unknown   left   . LUMBAR LAMINECTOMY/DECOMPRESSION MICRODISCECTOMY Left 06/04/2014   Procedure: LUMBAR LAMINECTOMY/DECOMPRESSION MICRODISCECTOMY LUMBAR FOUR-FIVE;  Surgeon: Elaina Hoops, MD;  Location: [place] [facility];  Service: Neurosurgery;  Laterality: Left;  . TONSILLECTOMY      OB History    No data available  Home Medications    Prior to Admission medications   Medication Sig Start Date End Date Taking? Authorizing Provider  allopurinol (ZYLOPRIM) 100 MG tablet Take 100 mg by mouth at bedtime.     [provider]  amLODipine (NORVASC) 10 MG tablet TAKE 1 TABLET EVERY DAY  *DOSE  INCREASE* 12/15/15   Arnoldo Lenis, MD  aspirin 81 MG tablet Take 81 mg by mouth at bedtime.     [provider]  colchicine 0.6 MG tablet Take one tablet PO and may repeat in one hour if no improvement. Then take one tablet every 3 hours until pain is relieved. 06/20/14   Ashley Murrain, NP  lisinopril (PRINIVIL,ZESTRIL) 40 MG tablet Take 1 tablet (40 mg total) by  mouth daily. 10/23/11   Wellington Hampshire, MD  metFORMIN (GLUCOPHAGE) 500 MG tablet Take 500 mg by mouth 2 (two) times daily with a meal.     [provider]  metoprolol tartrate (LOPRESSOR) 25 MG tablet Take 1 tablet (25 mg total) by mouth 2 (two) times daily. 03/26/17   Arnoldo Lenis, MD  Multiple Vitamin (MULTIVITAMIN WITH MINERALS) TABS tablet Take 1 tablet by mouth daily.    [provider]  nitroGLYCERIN (NITROSTAT) 0.4 MG SL tablet PLACE 1 TABLET  UNDER THE TONGUE EVERY 5 MINUTES AS NEEDED FOR CHEST PAIN. 06/28/15   Arnoldo Lenis, MD  oxyCODONE-acetaminophen (PERCOCET/ROXICET) 5-325 MG per tablet Take 1-2 tablets by mouth every 4 (four) hours as needed for moderate pain. Patient not taking: Reported on 02/28/2017 06/04/14   Kary Kos, MD  pravastatin (PRAVACHOL) 40 MG tablet Take 40 mg by mouth at bedtime.  07/25/11   [provider]    Family History Family History  Problem Relation Age of Onset  . Heart attack Sister 38  . Heart attack Brother 65  . Anesthesia problems Neg Hx   . Hypotension Neg Hx   . Malignant hyperthermia Neg Hx   . Pseudochol deficiency Neg Hx     Social History Social History   Tobacco Use  . Smoking status: Never Smoker  . Smokeless tobacco: Never Used  Substance Use Topics  . Alcohol use: No    Alcohol/week: 0.0 oz  . Drug use: No     Allergies   Patient has no known allergies.   Review of Systems Review of Systems  Musculoskeletal: Positive for gait problem and myalgias.       Walks with cane  Skin: Positive for wound.       Facial abrasions  All other systems reviewed and are negative.    Physical Exam Updated Vital Signs BP (!) 157/76   Pulse 72   Temp 97.9 F (36.6 C) (Oral)   Resp 16   Ht 5\' 3"  (1.6 m)   Wt 97.1 kg (214 lb)   SpO2 100%   BMI 37.91 kg/m   Physical Exam  Constitutional: She is oriented to person, place, and time. She appears well-developed and well-nourished. She appears  distressed.  Appears uncomfortable awake alert Glasgow Coma Score 15  HENT:  Right Ear: External ear normal.  Left Ear: External ear normal.  Small sized hematoma to forehead with overlying abrasion.  Tiny abrasion to bridge of nose no septal hematoma.  Otherwise normocephalic atraumatic.  Bilateral tympanic membranes normal  Eyes: Conjunctivae are normal. Pupils are equal, round, and reactive to light.  Neck: Neck supple. No tracheal deviation present. No thyromegaly present.  No tenderness  Cardiovascular: Normal  rate and regular rhythm.  No murmur heard. Pulmonary/Chest: Effort normal and breath sounds normal.  Abdominal: Soft. Bowel sounds are normal. She exhibits no distension. There is no tenderness.  Musculoskeletal: Normal range of motion. She exhibits no edema or tenderness.  Right upper extremity tender at forearm and upper arm.  Radial pulse 2+.  Hand is nontender.  Good capillary refill she is nontender over the clavicle.  Left upper extremity tender over upper arm and forearm.  Radial pulse 2+.  Good capillary refill.  Nontender over clavicle.  Left lower extremity tender over shin no swelling no deformity.  DP pulse 2+.  Good capillary refill.  Right lower extremity without contusion abrasion or tenderness neurovascular intact.  DP pulse 2+.  Good capillary refill.  Pelvis stable nontender.  Entire spine nontender.  Neurological: She is alert and oriented to person, place, and time. No cranial nerve deficit or sensory deficit. She exhibits normal muscle tone. Coordination normal.  Skin: Skin is warm and dry. No rash noted.  Psychiatric: She has a normal mood and affect.  Nursing note and vitals reviewed.    ED Treatments / Results  Labs (all labs ordered are listed, but only abnormal results are displayed) Labs Reviewed - No data to display  EKG  EKG Interpretation None       Radiology No results found.  Procedures Procedures (including critical care  time)  Medications Ordered in ED Medications  HYDROcodone-acetaminophen (NORCO/VICODIN) 5-325 MG per tablet 2 tablet (2 tablets Oral Given 08/28/17 0847)    X-rays viewed by me Results for orders placed or performed during the hospital encounter of 02/28/17  VAS [place] LOWER EXTREMITY ARTERIAL DUPLEX  Result Value Ref Range   Left super femoral prox sys PSV 141 cm/s   Left super femoral mid sys PSV -114 cm/s   Left super femoral dist sys PSV -79 cm/s   Right super femoral prox sys PSV -109 cm/s   Right super femoral mid sys PSV -135 cm/s   Right super femoral dist sys PSV -84 cm/s   Right peroneal sys PSV 51 cm/s   Left ant tibial distal sys 70 cm/s   LEFT PERO DIST SYS -23.00 cm/s   RIGHT ANT DIST TIBAL SYS PSV 101 cm/s   Dg Forearm Left  Result Date: 08/28/2017 CLINICAL DATA:  [place] today.  Left arm pain. EXAM: LEFT FOREARM - 2 VIEW COMPARISON:  None. FINDINGS: There is no evidence of fracture or other focal bone lesions. Soft tissues are unremarkable. IMPRESSION: Negative. Electronically Signed   By: Nelson Chimes M.D.   On: 08/28/2017 09:22   Dg Forearm Right  Result Date: 08/28/2017 CLINICAL DATA:  [place] today.  Right arm pain. EXAM: RIGHT FOREARM - 2 VIEW COMPARISON:  None. FINDINGS: There is no evidence of fracture or other focal bone lesions. Soft tissues are unremarkable. IMPRESSION: Negative. Electronically Signed   By: Nelson Chimes M.D.   On: 08/28/2017 09:22   Dg Tibia/fibula Left  Result Date: 08/28/2017 CLINICAL DATA:  [place] today.  Left lower extremity pain. EXAM: LEFT TIBIA AND FIBULA - 2 VIEW COMPARISON:  None. FINDINGS: There is no evidence of fracture or other focal bone lesions. Soft tissues are unremarkable except for soft tissue vascular clips. There is degenerative osteoarthritis of the knee joint. IMPRESSION: No acute or traumatic finding.  Osteoarthritis of the knee. Electronically Signed   By: Nelson Chimes M.D.   On: 08/28/2017 09:23   Ct Head Wo Contrast  Result  Date:  08/28/2017 CLINICAL DATA:  Fall.  Injury EXAM: CT HEAD WITHOUT CONTRAST CT CERVICAL SPINE WITHOUT CONTRAST TECHNIQUE: Multidetector CT imaging of the head and cervical spine was performed following the standard protocol without intravenous contrast. Multiplanar CT image reconstructions of the cervical spine were also generated. COMPARISON:  None. FINDINGS: CT HEAD FINDINGS Brain: Negative for acute hemorrhage, mass, or infarction. Mild chronic microvascular ischemia in the white matter Vascular: Negative for hyperdense vessel Skull: Negative Sinuses/Orbits: Air-fluid level right maxillary sinus with mucosal edema in the paranasal sinuses. Air-fluid level right sphenoid sinus. Normal orbit. Other: None CT CERVICAL SPINE FINDINGS Alignment: Normal Skull base and vertebrae: Negative for fracture Soft tissues and spinal canal: Negative Disc levels: Multilevel disc degeneration and spurring. Moderate disc protrusion and spurring at C3-4 with moderate spinal stenosis. Mild stenosis at C4-5 and C5-6 and C6-7. Upper chest: Negative Other: None IMPRESSION: 1. No acute intracranial abnormality 2. Cervical spinal stenosis and spondylosis.  Negative for fracture Electronically Signed   By: Franchot Gallo M.D.   On: 08/28/2017 09:44   Ct Cervical Spine Wo Contrast  Result Date: 08/28/2017 CLINICAL DATA:  Fall.  Injury EXAM: CT HEAD WITHOUT CONTRAST CT CERVICAL SPINE WITHOUT CONTRAST TECHNIQUE: Multidetector CT imaging of the head and cervical spine was performed following the standard protocol without intravenous contrast. Multiplanar CT image reconstructions of the cervical spine were also generated. COMPARISON:  None. FINDINGS: CT HEAD FINDINGS Brain: Negative for acute hemorrhage, mass, or infarction. Mild chronic microvascular ischemia in the white matter Vascular: Negative for hyperdense vessel Skull: Negative Sinuses/Orbits: Air-fluid level right maxillary sinus with mucosal edema in the paranasal sinuses.  Air-fluid level right sphenoid sinus. Normal orbit. Other: None CT CERVICAL SPINE FINDINGS Alignment: Normal Skull base and vertebrae: Negative for fracture Soft tissues and spinal canal: Negative Disc levels: Multilevel disc degeneration and spurring. Moderate disc protrusion and spurring at C3-4 with moderate spinal stenosis. Mild stenosis at C4-5 and C5-6 and C6-7. Upper chest: Negative Other: None IMPRESSION: 1. No acute intracranial abnormality 2. Cervical spinal stenosis and spondylosis.  Negative for fracture Electronically Signed   By: Franchot Gallo M.D.   On: 08/28/2017 09:44   Dg Humerus Left  Result Date: 08/28/2017 CLINICAL DATA:  [place] today with left arm pain. EXAM: LEFT HUMERUS - 2+ VIEW COMPARISON:  None. FINDINGS: No abnormality of the humerus. IMPRESSION: Negative. Electronically Signed   By: Nelson Chimes M.D.   On: 08/28/2017 09:21   Dg Humerus Right  Result Date: 08/28/2017 CLINICAL DATA:  [place] today.  Right arm pain. EXAM: RIGHT HUMERUS - 2+ VIEW COMPARISON:  None. FINDINGS: No abnormality of the humerus. IMPRESSION: Negative. Electronically Signed   By: Nelson Chimes M.D.   On: 08/28/2017 09:21   Initial Impression / Assessment and Plan / ED Course  I have reviewed the triage vital signs and the nursing notes.  Pertinent labs & imaging results that were available during my care of the patient were reviewed by me and considered in my medical decision making (see chart for details).     10:15 AM patient is alert ambulates without difficulty not lightheaded on standing.  Pain is improved after treatment with Norco plan Tylenol for pain local wound care for abrasions.  Follow-up with PMD as needed.  Final Clinical Impressions(s) / ED Diagnoses  Diagnoses #1 fall #2 Minor head injury #3 contusions multiple sites #4 facial abrasions Final diagnoses:  None    ED Discharge Orders    None  Orlie Dakin, MD 08/28/17 1549

## 2017-09-04 DIAGNOSIS — M25511 Pain in right shoulder: Secondary | ICD-10-CM | POA: Diagnosis not present

## 2017-09-04 DIAGNOSIS — Z6836 Body mass index (BMI) 36.0-36.9, adult: Secondary | ICD-10-CM | POA: Diagnosis not present

## 2017-09-16 ENCOUNTER — Other Ambulatory Visit: Payer: Self-pay | Admitting: *Deleted

## 2017-09-16 MED ORDER — METOPROLOL TARTRATE 25 MG PO TABS: 25.0000 mg | ORAL_TABLET | Freq: Two times a day (BID) | ORAL | 1 refills | Status: DC

## 2017-09-17 DIAGNOSIS — I739 Peripheral vascular disease, unspecified: Secondary | ICD-10-CM | POA: Diagnosis not present

## 2017-09-17 DIAGNOSIS — M25511 Pain in right shoulder: Secondary | ICD-10-CM | POA: Diagnosis not present

## 2017-09-17 DIAGNOSIS — I10 Essential (primary) hypertension: Secondary | ICD-10-CM | POA: Diagnosis not present

## 2017-09-17 DIAGNOSIS — I2510 Atherosclerotic heart disease of native coronary artery without angina pectoris: Secondary | ICD-10-CM | POA: Diagnosis not present

## 2017-09-17 DIAGNOSIS — M545 Low back pain: Secondary | ICD-10-CM | POA: Diagnosis not present

## 2017-09-17 DIAGNOSIS — Z6837 Body mass index (BMI) 37.0-37.9, adult: Secondary | ICD-10-CM | POA: Diagnosis not present

## 2017-09-17 DIAGNOSIS — E1122 Type 2 diabetes mellitus with diabetic chronic kidney disease: Secondary | ICD-10-CM | POA: Diagnosis not present

## 2017-09-17 DIAGNOSIS — N182 Chronic kidney disease, stage 2 (mild): Secondary | ICD-10-CM | POA: Diagnosis not present

## 2017-09-19 DIAGNOSIS — H25813 Combined forms of age-related cataract, bilateral: Secondary | ICD-10-CM | POA: Diagnosis not present

## 2017-09-20 DIAGNOSIS — Z1231 Encounter for screening mammogram for malignant neoplasm of breast: Secondary | ICD-10-CM | POA: Diagnosis not present

## 2017-10-02 NOTE — Patient Instructions (Signed)
Your procedure is scheduled on: 10/11/2017  Report to [facility] at  83   AM.  Call this number if you have problems the morning of surgery: [phone]   Do not eat food or drink liquids :After Midnight.      Take these medicines the morning of surgery with A SIP OF WATER: norvasc, colchicine, lisinopril, metoprolol.   Do not wear jewelry, make-up or nail polish.  Do not wear lotions, powders, or perfumes. You may wear deodorant.  Do not shave 48 hours prior to surgery.  Do not bring valuables to the hospital.  Contacts, dentures or bridgework may not be worn into surgery.  Leave suitcase in the car. After surgery it may be brought to your room.  For patients admitted to the hospital, checkout time is 11:00 AM the day of discharge.   Patients discharged the day of surgery will not be allowed to drive home.  :     Please read over the following fact sheets that you were given: Coughing and Deep Breathing, Surgical Site Infection Prevention, Anesthesia Post-op Instructions and Care and Recovery After Surgery    Cataract A cataract is a clouding of the lens of the eye. When a lens becomes cloudy, vision is reduced based on the degree and nature of the clouding. Many cataracts reduce vision to some degree. Some cataracts make people more near-sighted as they develop. Other cataracts increase glare. Cataracts that are ignored and become worse can sometimes look white. The white color can be seen through the pupil. CAUSES   Aging. However, cataracts may occur at any age, even in newborns.   Certain drugs.   Trauma to the eye.   Certain diseases such as diabetes.   Specific eye diseases such as chronic inflammation inside the eye or a sudden attack of a rare form of glaucoma.   Inherited or acquired medical problems.  SYMPTOMS   Gradual, progressive drop in vision in the affected eye.   Severe, rapid visual loss. This most often happens when trauma is the cause.  DIAGNOSIS  To  detect a cataract, an eye doctor examines the lens. Cataracts are best diagnosed with an exam of the eyes with the pupils enlarged (dilated) by drops.  TREATMENT  For an early cataract, vision may improve by using different eyeglasses or stronger lighting. If that does not help your vision, surgery is the only effective treatment. A cataract needs to be surgically removed when vision loss interferes with your everyday activities, such as driving, reading, or watching TV. A cataract may also have to be removed if it prevents examination or treatment of another eye problem. Surgery removes the cloudy lens and usually replaces it with a substitute lens (intraocular lens, IOL).  At a time when both you and your doctor agree, the cataract will be surgically removed. If you have cataracts in both eyes, only one is usually removed at a time. This allows the operated eye to heal and be out of danger from any possible problems after surgery (such as infection or poor wound healing). In rare cases, a cataract may be doing damage to your eye. In these cases, your caregiver may advise surgical removal right away. The vast majority of people who have cataract surgery have better vision afterward. HOME CARE INSTRUCTIONS  If you are not planning surgery, you may be asked to do the following:  Use different eyeglasses.   Use stronger or brighter lighting.   Ask your eye doctor about reducing  your medicine dose or changing medicines if it is thought that a medicine caused your cataract. Changing medicines does not make the cataract go away on its own.   Become familiar with your surroundings. Poor vision can lead to injury. Avoid bumping into things on the affected side. You are at a higher risk for tripping or falling.   Exercise extreme care when driving or operating machinery.   Wear sunglasses if you are sensitive to bright light or experiencing problems with glare.  SEEK IMMEDIATE MEDICAL CARE IF:   You have  a worsening or sudden vision loss.   You notice redness, swelling, or increasing pain in the eye.   You have a fever.  Document Released: 08/13/2005 Document Revised: 08/02/2011 Document Reviewed: 04/06/2011 [facility] Patient Information 2012 [place].PATIENT INSTRUCTIONS POST-ANESTHESIA  IMMEDIATELY FOLLOWING SURGERY:  Do not drive or operate machinery for the first twenty four hours after surgery.  Do not make any important decisions for twenty four hours after surgery or while taking narcotic pain medications or sedatives.  If you develop intractable nausea and vomiting or a severe headache please notify your doctor immediately.  FOLLOW-UP:  Please make an appointment with your surgeon as instructed. You do not need to follow up with anesthesia unless specifically instructed to do so.  WOUND CARE INSTRUCTIONS (if applicable):  Keep a dry clean dressing on the anesthesia/puncture wound site if there is drainage.  Once the wound has quit draining you may leave it open to air.  Generally you should leave the bandage intact for twenty four hours unless there is drainage.  If the epidural site drains for more than 36-48 hours please call the anesthesia department.  QUESTIONS?:  Please feel free to call your physician or the hospital operator if you have any questions, and they will be happy to assist you.

## 2017-10-07 ENCOUNTER — Encounter (HOSPITAL_COMMUNITY)
Admission: RE | Admit: 2017-10-07 | Discharge: 2017-10-07 | Disposition: A | Discharge status: Home / Self Care | Payer: Medicare HMO | Source: Ambulatory Visit | Attending: Ophthalmology | Admitting: Ophthalmology

## 2017-10-07 ENCOUNTER — Encounter (HOSPITAL_COMMUNITY): Payer: Self-pay

## 2017-10-07 ENCOUNTER — Other Ambulatory Visit: Payer: Self-pay

## 2017-10-07 DIAGNOSIS — Z01818 Encounter for other preprocedural examination: Secondary | ICD-10-CM | POA: Diagnosis not present

## 2017-10-07 DIAGNOSIS — H25811 Combined forms of age-related cataract, right eye: Secondary | ICD-10-CM | POA: Diagnosis not present

## 2017-10-07 LAB — BASIC METABOLIC PANEL
Anion gap: 14 (ref 5–15)
BUN: 21 mg/dL — ABNORMAL HIGH (ref 6–20)
CO2: 21 mmol/L — ABNORMAL LOW (ref 22–32)
Calcium: 8.9 mg/dL (ref 8.9–10.3)
Chloride: 104 mmol/L (ref 101–111)
Creatinine, Ser: 1.17 mg/dL — ABNORMAL HIGH (ref 0.44–1.00)
GFR calc Af Amer: 53 mL/min — ABNORMAL LOW (ref >60)
GFR calc non Af Amer: 45 mL/min — ABNORMAL LOW (ref >60)
Glucose, Bld: 224 mg/dL — ABNORMAL HIGH (ref 65–99)
Potassium: 3.7 mmol/L (ref 3.5–5.1)
Sodium: 139 mmol/L (ref 135–145)

## 2017-10-07 LAB — CBC WITH DIFFERENTIAL/PLATELET
Basophils Absolute: 0 10*3/uL (ref 0.0–0.1)
Basophils Relative: 1 %
Eosinophils Absolute: 0.2 10*3/uL (ref 0.0–0.7)
Eosinophils Relative: 3 %
HCT: 40 % (ref 36.0–46.0)
Hemoglobin: 13 g/dL (ref 12.0–15.0)
Lymphocytes Relative: 33 %
Lymphs Abs: 1.9 10*3/uL (ref 0.7–4.0)
MCH: 29.5 pg (ref 26.0–34.0)
MCHC: 32.5 g/dL (ref 30.0–36.0)
MCV: 90.9 fL (ref 78.0–100.0)
Monocytes Absolute: 0.3 10*3/uL (ref 0.1–1.0)
Monocytes Relative: 4 %
Neutro Abs: 3.5 10*3/uL (ref 1.7–7.7)
Neutrophils Relative %: 59 %
Platelets: 246 10*3/uL (ref 150–400)
RBC: 4.4 MIL/uL (ref 3.87–5.11)
RDW: 14.2 % (ref 11.5–15.5)
WBC: 5.9 10*3/uL (ref 4.0–10.5)

## 2017-10-07 LAB — HEMOGLOBIN A1C
Hgb A1c MFr Bld: 7.2 % — ABNORMAL HIGH (ref 4.8–5.6)
Mean Plasma Glucose: 159.94 mg/dL

## 2017-10-07 LAB — GLUCOSE, CAPILLARY: Glucose-Capillary: 222 mg/dL — ABNORMAL HIGH (ref 65–99)

## 2017-10-07 NOTE — Pre-Procedure Instructions (Signed)
HmbA1C routed to PCP.

## 2017-10-10 MED ORDER — CYCLOPENTOLATE-PHENYLEPHRINE 0.2-1 % OP SOLN
OPHTHALMIC | Status: AC
  Filled 2017-10-10: qty 2

## 2017-10-10 MED ORDER — LIDOCAINE HCL 3.5 % OP GEL
OPHTHALMIC | Status: AC
  Filled 2017-10-10: qty 1

## 2017-10-10 MED ORDER — TETRACAINE HCL 0.5 % OP SOLN
OPHTHALMIC | Status: AC
  Filled 2017-10-10: qty 4

## 2017-10-10 MED ORDER — NEOMYCIN-POLYMYXIN-DEXAMETH 3.5-10000-0.1 OP SUSP
OPHTHALMIC | Status: AC
  Filled 2017-10-10: qty 5

## 2017-10-10 MED ORDER — LIDOCAINE HCL (PF) 1 % IJ SOLN
INTRAMUSCULAR | Status: AC
  Filled 2017-10-10: qty 2

## 2017-10-10 MED ORDER — PHENYLEPHRINE HCL 2.5 % OP SOLN
OPHTHALMIC | Status: AC
  Filled 2017-10-10: qty 15

## 2017-10-11 ENCOUNTER — Encounter (HOSPITAL_COMMUNITY): Payer: Self-pay | Admitting: Anesthesiology

## 2017-10-11 ENCOUNTER — Ambulatory Visit (HOSPITAL_COMMUNITY): Payer: Medicare HMO | Admitting: Anesthesiology

## 2017-10-11 ENCOUNTER — Ambulatory Visit (HOSPITAL_COMMUNITY)
Admission: RE | Admit: 2017-10-11 | Discharge: 2017-10-11 | Disposition: A | Discharge status: Home / Self Care | Payer: Medicare HMO | Source: Ambulatory Visit | Attending: Ophthalmology | Admitting: Ophthalmology

## 2017-10-11 ENCOUNTER — Encounter (HOSPITAL_COMMUNITY)
Admission: RE | Disposition: A | Discharge status: Home / Self Care | Payer: Self-pay | Source: Ambulatory Visit | Attending: Ophthalmology

## 2017-10-11 DIAGNOSIS — H25811 Combined forms of age-related cataract, right eye: Secondary | ICD-10-CM | POA: Diagnosis not present

## 2017-10-11 DIAGNOSIS — Z7982 Long term (current) use of aspirin: Secondary | ICD-10-CM | POA: Insufficient documentation

## 2017-10-11 DIAGNOSIS — Z7984 Long term (current) use of oral hypoglycemic drugs: Secondary | ICD-10-CM | POA: Insufficient documentation

## 2017-10-11 DIAGNOSIS — I10 Essential (primary) hypertension: Secondary | ICD-10-CM | POA: Diagnosis not present

## 2017-10-11 DIAGNOSIS — H269 Unspecified cataract: Secondary | ICD-10-CM | POA: Diagnosis not present

## 2017-10-11 DIAGNOSIS — Z79899 Other long term (current) drug therapy: Secondary | ICD-10-CM | POA: Insufficient documentation

## 2017-10-11 DIAGNOSIS — H2511 Age-related nuclear cataract, right eye: Secondary | ICD-10-CM | POA: Diagnosis not present

## 2017-10-11 DIAGNOSIS — E119 Type 2 diabetes mellitus without complications: Secondary | ICD-10-CM | POA: Diagnosis not present

## 2017-10-11 HISTORY — PX: CATARACT EXTRACTION W/PHACO: SHX586

## 2017-10-11 LAB — GLUCOSE, CAPILLARY: Glucose-Capillary: 128 mg/dL — ABNORMAL HIGH (ref 65–99)

## 2017-10-11 SURGERY — PHACOEMULSIFICATION, CATARACT, WITH IOL INSERTION
Anesthesia: Monitor Anesthesia Care | Site: Eye | Laterality: Right

## 2017-10-11 MED ORDER — PROVISC 10 MG/ML IO SOLN
INTRAOCULAR | Status: DC | PRN
  Administered 2017-10-11: 0.85 mL via INTRAOCULAR

## 2017-10-11 MED ORDER — NEOMYCIN-POLYMYXIN-DEXAMETH 3.5-10000-0.1 OP SUSP
OPHTHALMIC | Status: DC | PRN
  Administered 2017-10-11: 2 [drp] via OPHTHALMIC

## 2017-10-11 MED ORDER — CYCLOPENTOLATE-PHENYLEPHRINE 0.2-1 % OP SOLN
1.0000 [drp] | OPHTHALMIC | Status: AC
  Administered 2017-10-11 (×3): 1 [drp] via OPHTHALMIC

## 2017-10-11 MED ORDER — LACTATED RINGERS IV SOLN
INTRAVENOUS | Status: DC
  Administered 2017-10-11: 1000 mL via INTRAVENOUS

## 2017-10-11 MED ORDER — MIDAZOLAM HCL 2 MG/2ML IJ SOLN
1.0000 mg | INTRAMUSCULAR | Status: AC
  Administered 2017-10-11 (×2): 2 mg via INTRAVENOUS
  Filled 2017-10-11: qty 2

## 2017-10-11 MED ORDER — EPINEPHRINE PF 1 MG/ML IJ SOLN
INTRAMUSCULAR | Status: AC
  Filled 2017-10-11: qty 1

## 2017-10-11 MED ORDER — EPINEPHRINE PF 1 MG/ML IJ SOLN
INTRAOCULAR | Status: DC | PRN
  Administered 2017-10-11: 500 mL

## 2017-10-11 MED ORDER — LIDOCAINE HCL 3.5 % OP GEL
1.0000 "application " | Freq: Once | OPHTHALMIC | Status: AC
  Administered 2017-10-11: 1 via OPHTHALMIC

## 2017-10-11 MED ORDER — TETRACAINE HCL 0.5 % OP SOLN
1.0000 [drp] | OPHTHALMIC | Status: AC
  Administered 2017-10-11 (×3): 1 [drp] via OPHTHALMIC

## 2017-10-11 MED ORDER — BSS IO SOLN
INTRAOCULAR | Status: DC | PRN
  Administered 2017-10-11: 15 mL via INTRAOCULAR

## 2017-10-11 MED ORDER — POVIDONE-IODINE 5 % OP SOLN
OPHTHALMIC | Status: DC | PRN
  Administered 2017-10-11: 1 via OPHTHALMIC

## 2017-10-11 MED ORDER — FENTANYL CITRATE (PF) 100 MCG/2ML IJ SOLN
INTRAMUSCULAR | Status: AC
  Filled 2017-10-11: qty 2

## 2017-10-11 MED ORDER — SODIUM HYALURONATE 23 MG/ML IO SOLN
INTRAOCULAR | Status: DC | PRN
  Administered 2017-10-11: 0.6 mL via INTRAOCULAR

## 2017-10-11 MED ORDER — MIDAZOLAM HCL 2 MG/2ML IJ SOLN
INTRAMUSCULAR | Status: AC
  Filled 2017-10-11: qty 2

## 2017-10-11 MED ORDER — LIDOCAINE HCL (PF) 1 % IJ SOLN
INTRAOCULAR | Status: DC | PRN
  Administered 2017-10-11: 1 mL

## 2017-10-11 MED ORDER — PHENYLEPHRINE HCL 2.5 % OP SOLN
1.0000 [drp] | OPHTHALMIC | Status: AC
  Administered 2017-10-11 (×3): 1 [drp] via OPHTHALMIC

## 2017-10-11 MED ORDER — FENTANYL CITRATE (PF) 100 MCG/2ML IJ SOLN
25.0000 ug | Freq: Once | INTRAMUSCULAR | Status: AC
  Administered 2017-10-11: 25 ug via INTRAVENOUS

## 2017-10-11 SURGICAL SUPPLY — 16 items
CLOTH BEACON ORANGE TIMEOUT ST (SAFETY) ×1 IMPLANT
EYE SHIELD UNIVERSAL CLEAR (GAUZE/BANDAGES/DRESSINGS) ×1 IMPLANT
GLOVE BIOGEL PI IND STRL 6.5 (GLOVE) IMPLANT
GLOVE BIOGEL PI IND STRL 7.0 (GLOVE) IMPLANT
GLOVE BIOGEL PI INDICATOR 6.5 (GLOVE) ×1
GLOVE BIOGEL PI INDICATOR 7.0 (GLOVE) ×1
NDL HYPO 18GX1.5 BLUNT FILL (NEEDLE) IMPLANT
NEEDLE HYPO 18GX1.5 BLUNT FILL (NEEDLE) ×2 IMPLANT
PAD ARMBOARD 7.5X6 YLW CONV (MISCELLANEOUS) ×1 IMPLANT
RING MALYGIN (MISCELLANEOUS) IMPLANT
SIGHTPATH CAT PROC W REG LENS (Ophthalmic Related) ×1 IMPLANT
SYR TB 1ML LL NO SAFETY (SYRINGE) ×1 IMPLANT
TAPE SURG TRANSPORE 1 IN (GAUZE/BANDAGES/DRESSINGS) IMPLANT
TAPE SURGICAL TRANSPORE 1 IN (GAUZE/BANDAGES/DRESSINGS) ×1
VISCOELASTIC ADDITIONAL (OPHTHALMIC RELATED) ×1 IMPLANT
WATER STERILE IRR 250ML POUR (IV SOLUTION) ×1 IMPLANT

## 2017-10-11 NOTE — Anesthesia Postprocedure Evaluation (Signed)
Anesthesia Post Note  Patient: [name]  Procedure(s) Performed: CATARACT EXTRACTION PHACO AND INTRAOCULAR LENS PLACEMENT RIGHT EYE (Right Eye)  Patient location during evaluation: Short Stay Anesthesia Type: MAC Level of consciousness: awake and alert, oriented and patient cooperative Pain management: pain level controlled Vital Signs Assessment: post-procedure vital signs reviewed and stable Respiratory status: spontaneous breathing and respiratory function stable Cardiovascular status: blood pressure returned to baseline and stable Postop Assessment: no headache, no backache and no apparent nausea or vomiting Anesthetic complications: no     Last Vitals:  Vitals:   10/11/17 0730 10/11/17 0759  BP: 133/63 (!) 142/57  Pulse:  65  Resp: 17 18  Temp:  36.4 C  SpO2: 100% 98%    Last Pain:  Vitals:   10/11/17 0759  TempSrc: Oral                 Emari Demmer

## 2017-10-11 NOTE — Discharge Instructions (Signed)
Please discharge patient when stable, will follow up today with Dr. Wrzosek at the [facility] office immediately following discharge.  Leave shield in place until visit.  All paperwork with discharge instructions will be given at the office. ° ° °Moderate Conscious Sedation, Adult, Care After °These instructions provide you with information about caring for yourself after your procedure. Your health care provider may also give you more specific instructions. Your treatment has been planned according to current medical practices, but problems sometimes occur. Call your health care provider if you have any problems or questions after your procedure. °What can I expect after the procedure? °After your procedure, it is common: °· To feel sleepy for several hours. °· To feel clumsy and have poor balance for several hours. °· To have poor judgment for several hours. °· To vomit if you eat too soon. ° °Follow these instructions at home: °For at least 24 hours after the procedure: ° °· Do not: °? Participate in activities where you could fall or become injured. °? Drive. °? Use heavy machinery. °? Drink alcohol. °? Take sleeping pills or medicines that cause drowsiness. °? Make important decisions or sign legal documents. °? Take care of children on your own. °· Rest. °Eating and drinking °· Follow the diet recommended by your health care provider. °· If you vomit: °? Drink water, juice, or soup when you can drink without vomiting. °? Make sure you have little or no nausea before eating solid foods. °General instructions °· Have a responsible adult stay with you until you are awake and alert. °· Take over-the-counter and prescription medicines only as told by your health care provider. °· If you smoke, do not smoke without supervision. °· Keep all follow-up visits as told by your health care provider. This is important. °Contact a health care provider if: °· You keep feeling nauseous or you keep vomiting. °· You  feel light-headed. °· You develop a rash. °· You have a fever. °Get help right away if: °· You have trouble breathing. °This information is not intended to replace advice given to you by your health care provider. Make sure you discuss any questions you have with your health care provider. °Document Released: 06/03/2013 Document Revised: 01/16/2016 Document Reviewed: 12/03/2015 °Elsevier Interactive Patient Education © 2018 [employer] ° ° °

## 2017-10-11 NOTE — Transfer of Care (Signed)
Immediate Anesthesia Transfer of Care Note  Patient: [name]  Procedure(s) Performed: CATARACT EXTRACTION PHACO AND INTRAOCULAR LENS PLACEMENT RIGHT EYE (Right Eye)  Patient Location: Short Stay  Anesthesia Type:MAC  Level of Consciousness: awake, alert , oriented and patient cooperative  Airway & Oxygen Therapy: Patient Spontanous Breathing and Patient connected to nasal cannula oxygen  Post-op Assessment: Report given to RN and Post -op Vital signs reviewed and stable  Post vital signs: Reviewed and stable  Last Vitals:  Vitals:   10/11/17 0730 10/11/17 0759  BP: 133/63 (!) 142/57  Pulse:  65  Resp: 17 18  Temp:  36.4 C  SpO2: 100% 98%    Last Pain:  Vitals:   10/11/17 0759  TempSrc: Oral         Complications: No apparent anesthesia complications

## 2017-10-11 NOTE — Anesthesia Preprocedure Evaluation (Signed)
Anesthesia Evaluation  Patient identified by MRN, date of birth, ID band Patient awake    Reviewed: Allergy & Precautions, H&P , NPO status , Patient's Chart, lab work & pertinent test results, reviewed documented beta blocker date and time   Airway Mallampati: I  TM Distance: >3 FB Neck ROM: Full    Dental  (+) Teeth Intact, Dental Advisory Given   Pulmonary neg pulmonary ROS,    breath sounds clear to auscultation       Cardiovascular hypertension, Pt. on medications and Pt. on home beta blockers + CAD and + Past MI (CABG 2013)  + dysrhythmias  Rhythm:Regular Rate:Normal     Neuro/Psych    GI/Hepatic   Endo/Other  diabetes, Well Controlled, Type 2, Oral Hypoglycemic AgentsMorbid obesity  Renal/GU      Musculoskeletal   Abdominal   Peds  Hematology   Anesthesia Other Findings   Reproductive/Obstetrics                             Anesthesia Physical Anesthesia Plan  ASA: III  Anesthesia Plan: MAC   Post-op Pain Management:    Induction: Intravenous  PONV Risk Score and Plan:   Airway Management Planned: Nasal Cannula  Additional Equipment:   Intra-op Plan:   Post-operative Plan:   Informed Consent: I have reviewed the patients History and Physical, chart, labs and discussed the procedure including the risks, benefits and alternatives for the proposed anesthesia with the patient or authorized representative who has indicated his/her understanding and acceptance.     Plan Discussed with:   Anesthesia Plan Comments:         Anesthesia Quick Evaluation

## 2017-10-11 NOTE — H&P (Signed)
The H and P was reviewed and updated. The patient was examined.  No changes were found after exam.  The surgical eye was marked.  

## 2017-10-11 NOTE — Op Note (Signed)
Date of procedure: 10/11/17  Pre-operative diagnosis: Visually significant cataract, Right Eye  Post-operative diagnosis: Visually significant cataract, Right Eye  Procedure: Removal of cataract via phacoemulsification and insertion of intra-ocular lens [employer] PCB00  +20.0D into the capsular bag of the Right Eye  Attending surgeon: Gerda Diss. Shatasha Lambing, MD, MA  Anesthesia: MAC, Topical Akten  Complications: None  Estimated Blood Loss: <61m (minimal)  Specimens: None  Implants: As above  Indications:  Visually significant cataract, Right Eye  Procedure:  The patient was seen and identified in the pre-operative area. The operative eye was identified and dilated.  The operative eye was marked.  Topical anesthesia was administered to the operative eye.     The patient was then to the operative suite and placed in the supine position.  A timeout was performed confirming the patient, procedure to be performed, and all other relevant information.   The patient's face was prepped and draped in the usual fashion for intra-ocular surgery.  A lid speculum was placed into the operative eye and the surgical microscope moved into place and focused.  A superotemporal paracentesis was created using a 20 gauge paracentesis blade.  Shugarcaine was injected into the anterior chamber.  Viscoelastic was injected into the anterior chamber.  A temporal clear-corneal main wound incision was created using a 2.465mmicrokeratome.  A continuous curvilinear capsulorrhexis was initiated using an irrigating cystitome and completed using capsulorrhexis forceps.  Hydrodissection and hydrodeliniation were performed.  Viscoelastic was injected into the anterior chamber.  A phacoemulsification handpiece and a chopper as a second instrument were used to remove the nucleus and epinucleus. The irrigation/aspiration handpiece was used to remove any remaining cortical material.   The capsular bag was reinflated  with viscoelastic, checked, and found to be intact.  The intraocular lens was inserted into the capsular bag and dialed into place using a Kuglen hook.  The irrigation/aspiration handpiece was used to remove any remaining viscoelastic.  The clear corneal wound and paracentesis wounds were then hydrated and checked with Weck-Cels to be watertight.  The lid-speculum and drape was removed, and the patient's face was cleaned with a wet and dry 4x4.  Maxitrol was instilled in the eye before a clear shield was taped over the eye. The patient was taken to the post-operative care unit in good condition, having tolerated the procedure well.  Post-Op Instructions: [facility] and will receive all other orders and instructions.

## 2017-10-14 ENCOUNTER — Encounter (HOSPITAL_COMMUNITY): Payer: Self-pay | Admitting: Ophthalmology

## 2017-10-24 ENCOUNTER — Other Ambulatory Visit: Payer: Self-pay

## 2017-10-24 ENCOUNTER — Ambulatory Visit: Payer: Medicare HMO | Admitting: Cardiology

## 2017-10-24 ENCOUNTER — Encounter: Payer: Self-pay | Admitting: *Deleted

## 2017-10-24 ENCOUNTER — Encounter: Payer: Self-pay | Admitting: Cardiology

## 2017-10-24 VITALS — BP 141/82 | HR 74 | Ht 62.0 in | Wt 213.0 lb

## 2017-10-24 DIAGNOSIS — I10 Essential (primary) hypertension: Secondary | ICD-10-CM | POA: Diagnosis not present

## 2017-10-24 DIAGNOSIS — E782 Mixed hyperlipidemia: Secondary | ICD-10-CM

## 2017-10-24 DIAGNOSIS — I2510 Atherosclerotic heart disease of native coronary artery without angina pectoris: Secondary | ICD-10-CM

## 2017-10-24 NOTE — Patient Instructions (Signed)

## 2017-10-24 NOTE — Progress Notes (Signed)
Clinical Summary [name] is a 72 y.o.female seen today for follow up of the following medical problems.  1. CAD  - CABG Jan 2013: 3 vessel (LIMA to LAD, SVG to OM, SVG to acute marginal).She had prior stenting as described below  - Jan 2013 echo LVEF 55-60%     - no recent chest pain. No SOB/DOE - compliant with meds  2. Hyperlipidemia  - on pravastatin, compliant  - she reports she was on lipitor in the past, however it was changed for unclear reasons.   - compliant with statin. She reports labs coming up with pcp 11/2017   3. HTN  - compliant with meds    Past Medical History:  Diagnosis Date  . Aortic stenosis   . Arthritis   . AV block, 1st degree   . Back pain    occasionally with weather changes  . CAD (coronary artery disease)    Non-STEMI/Taxus stenting 100% left anterior descending (2), January 2008. Residual 70% circumflex;80% right coronary artery. Mild LVD (EF 45%);improved to 55-60% by 2-D echocardiogram,January 2009.   . DM (diabetes mellitus) ([place])    takes Metformin 1000mg  BID  . Dyslipidemia    takes Pravastatin daily  . Early cataracts, bilateral   . Gout    takes Allopurinol daily  . Herniated disc    left  . Lumbar herniated disc   . Myocardial infarction [facility]) 2008   pt has 2 stents  . Nocturia   . Obesity   . Unspecified essential hypertension    takes Metoprolol,Lisinopril,and Amlodipine  . Urinary frequency      No Known Allergies   Current Outpatient Medications  Medication Sig Dispense Refill  . acetaminophen (TYLENOL) 500 MG tablet Take 1,000 mg by mouth 2 (two) times daily as needed for moderate pain or headache.    . allopurinol (ZYLOPRIM) 100 MG tablet Take 100 mg by mouth at bedtime.     [name] amLODipine (NORVASC) 10 MG tablet TAKE 1 TABLET EVERY DAY  *DOSE  INCREASE* 90 tablet 3  . aspirin 81 MG tablet Take 81 mg by mouth at bedtime.     . colchicine 0.6 MG tablet Take one tablet PO and may repeat in one  hour if no improvement. Then take one tablet every 3 hours until pain is relieved. 30 tablet 0  . lisinopril (PRINIVIL,ZESTRIL) 40 MG tablet Take 1 tablet (40 mg total) by mouth daily. 90 tablet 3  . Menthol, Topical Analgesic, (ICY HOT EX) Apply 1 application topically daily as needed (muscle pain).    . Menthol, Topical Analgesic, (ZIMS MAX-FREEZE EX) Apply 1 application topically daily as needed (muscle pain).    . metFORMIN (GLUCOPHAGE) 500 MG tablet Take 500 mg by mouth 2 (two) times daily with a meal.     . metoprolol tartrate (LOPRESSOR) 25 MG tablet Take 1 tablet (25 mg total) by mouth 2 (two) times daily. 180 tablet 1  . Multiple Vitamin (MULTIVITAMIN WITH MINERALS) TABS tablet Take 1 tablet by mouth daily.    . nitroGLYCERIN (NITROSTAT) 0.4 MG SL tablet PLACE 1 TABLET  UNDER THE TONGUE EVERY 5 MINUTES AS NEEDED FOR CHEST PAIN. 25 tablet 3  . pravastatin (PRAVACHOL) 40 MG tablet Take 40 mg by mouth at bedtime.     [name] tiZANidine (ZANAFLEX) 4 MG tablet Take 4 mg by mouth at bedtime as needed for muscle spasms.  0   No current facility-administered medications for this visit.  Past Surgical History:  Procedure Laterality Date  . ABDOMINAL HYSTERECTOMY    . BACK SURGERY  2009/2010/2011  . BREAST BIOPSY  unknown   left  . CARDIAC CATHETERIZATION  2008   NON-STEMI/TAXUS STENTING 100% PROXIMAL LAD (X2) JANUARY 2008  . CARDIAC CATHETERIZATION  2012   occluded LAD stents, Om1 : 70%, RCA: 80%, Ef 40-45%  . CARPAL TUNNEL RELEASE     bilateral  . CATARACT EXTRACTION W/PHACO Right 10/11/2017   Procedure: CATARACT EXTRACTION PHACO AND INTRAOCULAR LENS PLACEMENT RIGHT EYE;  Surgeon: Baruch Goldmann, MD;  Location: [facility];  Service: Ophthalmology;  Laterality: Right;  CDE: 5.17  . CHOLECYSTECTOMY  not sure  . COLONOSCOPY    . CORONARY ARTERY BYPASS GRAFT  09/05/2011   Procedure: CORONARY ARTERY BYPASS GRAFTING (CABG);  Surgeon: Grace Isaac, MD;  Location: [place];  Service: Open Heart  Surgery;  Laterality: N/A;  CABG times three using left internal mammary artery and left leg greater saphenous vein harvested endoscopically  . LEG TENDON SURGERY  unknown   left   . LUMBAR LAMINECTOMY/DECOMPRESSION MICRODISCECTOMY Left 06/04/2014   Procedure: LUMBAR LAMINECTOMY/DECOMPRESSION MICRODISCECTOMY LUMBAR FOUR-FIVE;  Surgeon: Elaina Hoops, MD;  Location: [place] [facility];  Service: Neurosurgery;  Laterality: Left;  . TONSILLECTOMY       No Known Allergies    Family History  Problem Relation Age of Onset  . Heart attack Sister 93  . Heart attack Brother 49  . Anesthesia problems Neg Hx   . Hypotension Neg Hx   . Malignant hyperthermia Neg Hx   . Pseudochol deficiency Neg Hx      Social History Ms. Basic reports that  has never smoked. she has never used smokeless tobacco. Ms. Platter reports that she does not drink alcohol.   Review of Systems CONSTITUTIONAL: No weight loss, fever, chills, weakness or fatigue.  HEENT: Eyes: No visual loss, blurred vision, double vision or yellow sclerae.No hearing loss, sneezing, congestion, runny nose or sore throat.  SKIN: No rash or itching.  CARDIOVASCULAR: per hpi RESPIRATORY: per hpi GASTROINTESTINAL: No anorexia, nausea, vomiting or diarrhea. No abdominal pain or blood.  GENITOURINARY: No burning on urination, no polyuria NEUROLOGICAL: No headache, dizziness, syncope, paralysis, ataxia, numbness or tingling in the extremities. No change in bowel or bladder control.  MUSCULOSKELETAL: No muscle, back pain, joint pain or stiffness.  LYMPHATICS: No enlarged nodes. No history of splenectomy.  PSYCHIATRIC: No history of depression or anxiety.  ENDOCRINOLOGIC: No reports of sweating, cold or heat intolerance. No polyuria or polydipsia.  [name]   Physical Examination Vitals:   10/24/17 1034  BP: (!) 141/82  Pulse: 74  SpO2: 100%   Vitals:   10/24/17 1034  Weight: 213 lb (96.6 kg)  Height: 5\' 2"  (1.575 m)    Gen: resting  comfortably, no acute distress HEENT: no scleral icterus, pupils equal round and reactive, no palptable cervical adenopathy,  CV: RRR, no m/r/g no jvd Resp: Clear to auscultation bilaterally GI: abdomen is soft, non-tender, non-distended, normal bowel sounds, no hepatosplenomegaly MSK: extremities are warm, no edema.  Skin: warm, no rash Neuro:  no focal deficits Psych: appropriate affect   Diagnostic Studies Jan 2013 LVEF 33-29%, diastolic dysfunction,        Assessment and Plan  1. CAD  - status post CABG.  - no recent symptoms - continue current meds - EKG in clinic SR, no ischemic changes  2. HTN  - manula bp 130/80 and at goal, continue current meds  3. Hyperlipidemia  - continue pravastatin, reports being switched from lipitor in the past, unclear if she had reaction to more potent statin.  - has labs with pcp next   F/u 1 year        Arnoldo Lenis, M.D

## 2017-10-29 ENCOUNTER — Encounter: Payer: Self-pay | Admitting: Cardiology

## 2017-12-18 DIAGNOSIS — N182 Chronic kidney disease, stage 2 (mild): Secondary | ICD-10-CM | POA: Diagnosis not present

## 2017-12-18 DIAGNOSIS — E559 Vitamin D deficiency, unspecified: Secondary | ICD-10-CM | POA: Diagnosis not present

## 2017-12-18 DIAGNOSIS — E782 Mixed hyperlipidemia: Secondary | ICD-10-CM | POA: Diagnosis not present

## 2017-12-18 DIAGNOSIS — I10 Essential (primary) hypertension: Secondary | ICD-10-CM | POA: Diagnosis not present

## 2017-12-18 DIAGNOSIS — E1122 Type 2 diabetes mellitus with diabetic chronic kidney disease: Secondary | ICD-10-CM | POA: Diagnosis not present

## 2017-12-18 DIAGNOSIS — M47812 Spondylosis without myelopathy or radiculopathy, cervical region: Secondary | ICD-10-CM | POA: Diagnosis not present

## 2017-12-20 DIAGNOSIS — H25811 Combined forms of age-related cataract, right eye: Secondary | ICD-10-CM | POA: Diagnosis not present

## 2017-12-24 ENCOUNTER — Encounter (HOSPITAL_COMMUNITY): Payer: Self-pay

## 2017-12-24 ENCOUNTER — Encounter (HOSPITAL_COMMUNITY)
Admission: RE | Admit: 2017-12-24 | Discharge: 2017-12-24 | Disposition: A | Discharge status: Home / Self Care | Payer: Medicare HMO | Source: Ambulatory Visit | Attending: Ophthalmology | Admitting: Ophthalmology

## 2017-12-25 DIAGNOSIS — M4802 Spinal stenosis, cervical region: Secondary | ICD-10-CM | POA: Diagnosis not present

## 2017-12-25 DIAGNOSIS — Z0001 Encounter for general adult medical examination with abnormal findings: Secondary | ICD-10-CM | POA: Diagnosis not present

## 2017-12-25 DIAGNOSIS — I10 Essential (primary) hypertension: Secondary | ICD-10-CM | POA: Diagnosis not present

## 2017-12-25 DIAGNOSIS — I6523 Occlusion and stenosis of bilateral carotid arteries: Secondary | ICD-10-CM | POA: Diagnosis not present

## 2017-12-25 DIAGNOSIS — I2510 Atherosclerotic heart disease of native coronary artery without angina pectoris: Secondary | ICD-10-CM | POA: Diagnosis not present

## 2017-12-25 DIAGNOSIS — I739 Peripheral vascular disease, unspecified: Secondary | ICD-10-CM | POA: Diagnosis not present

## 2017-12-25 DIAGNOSIS — M545 Low back pain: Secondary | ICD-10-CM | POA: Diagnosis not present

## 2017-12-25 DIAGNOSIS — M47812 Spondylosis without myelopathy or radiculopathy, cervical region: Secondary | ICD-10-CM | POA: Diagnosis not present

## 2017-12-25 DIAGNOSIS — N182 Chronic kidney disease, stage 2 (mild): Secondary | ICD-10-CM | POA: Diagnosis not present

## 2017-12-25 DIAGNOSIS — E1122 Type 2 diabetes mellitus with diabetic chronic kidney disease: Secondary | ICD-10-CM | POA: Diagnosis not present

## 2017-12-25 DIAGNOSIS — Z6841 Body Mass Index (BMI) 40.0 and over, adult: Secondary | ICD-10-CM | POA: Diagnosis not present

## 2017-12-27 ENCOUNTER — Ambulatory Visit (HOSPITAL_COMMUNITY): Payer: Medicare HMO | Admitting: Anesthesiology

## 2017-12-27 ENCOUNTER — Encounter (HOSPITAL_COMMUNITY)
Admission: RE | Disposition: A | Discharge status: Home / Self Care | Payer: Self-pay | Source: Ambulatory Visit | Attending: Ophthalmology

## 2017-12-27 ENCOUNTER — Ambulatory Visit (HOSPITAL_COMMUNITY)
Admission: RE | Admit: 2017-12-27 | Discharge: 2017-12-27 | Disposition: A | Discharge status: Home / Self Care | Payer: Medicare HMO | Source: Ambulatory Visit | Attending: Ophthalmology | Admitting: Ophthalmology

## 2017-12-27 ENCOUNTER — Encounter (HOSPITAL_COMMUNITY): Payer: Self-pay

## 2017-12-27 DIAGNOSIS — H2512 Age-related nuclear cataract, left eye: Secondary | ICD-10-CM | POA: Diagnosis not present

## 2017-12-27 DIAGNOSIS — I10 Essential (primary) hypertension: Secondary | ICD-10-CM | POA: Insufficient documentation

## 2017-12-27 DIAGNOSIS — E119 Type 2 diabetes mellitus without complications: Secondary | ICD-10-CM | POA: Diagnosis not present

## 2017-12-27 DIAGNOSIS — Z7984 Long term (current) use of oral hypoglycemic drugs: Secondary | ICD-10-CM | POA: Diagnosis not present

## 2017-12-27 DIAGNOSIS — Z79899 Other long term (current) drug therapy: Secondary | ICD-10-CM | POA: Insufficient documentation

## 2017-12-27 DIAGNOSIS — H25812 Combined forms of age-related cataract, left eye: Secondary | ICD-10-CM | POA: Insufficient documentation

## 2017-12-27 DIAGNOSIS — Z7982 Long term (current) use of aspirin: Secondary | ICD-10-CM | POA: Diagnosis not present

## 2017-12-27 HISTORY — PX: CATARACT EXTRACTION W/PHACO: SHX586

## 2017-12-27 LAB — GLUCOSE, CAPILLARY: Glucose-Capillary: 120 mg/dL — ABNORMAL HIGH (ref 65–99)

## 2017-12-27 SURGERY — PHACOEMULSIFICATION, CATARACT, WITH IOL INSERTION
Anesthesia: Monitor Anesthesia Care | Site: Eye | Laterality: Left

## 2017-12-27 MED ORDER — LIDOCAINE HCL 3.5 % OP GEL
1.0000 "application " | Freq: Once | OPHTHALMIC | Status: AC
  Administered 2017-12-27: 1 via OPHTHALMIC

## 2017-12-27 MED ORDER — TETRACAINE HCL 0.5 % OP SOLN
1.0000 [drp] | OPHTHALMIC | Status: AC
  Administered 2017-12-27 (×3): 1 [drp] via OPHTHALMIC

## 2017-12-27 MED ORDER — BSS IO SOLN
INTRAOCULAR | Status: DC | PRN
  Administered 2017-12-27: 15 mL

## 2017-12-27 MED ORDER — EPINEPHRINE PF 1 MG/ML IJ SOLN
INTRAOCULAR | Status: DC | PRN
  Administered 2017-12-27: 1 mL via OPHTHALMIC

## 2017-12-27 MED ORDER — MIDAZOLAM HCL 5 MG/5ML IJ SOLN
INTRAMUSCULAR | Status: DC | PRN
  Administered 2017-12-27 (×2): 1 mg via INTRAVENOUS

## 2017-12-27 MED ORDER — MIDAZOLAM HCL 2 MG/2ML IJ SOLN
INTRAMUSCULAR | Status: AC
  Filled 2017-12-27: qty 2

## 2017-12-27 MED ORDER — PHENYLEPHRINE HCL 2.5 % OP SOLN
1.0000 [drp] | OPHTHALMIC | Status: AC
  Administered 2017-12-27 (×3): 1 [drp] via OPHTHALMIC

## 2017-12-27 MED ORDER — PROVISC 10 MG/ML IO SOLN
INTRAOCULAR | Status: DC | PRN
  Administered 2017-12-27: 0.85 mL via INTRAOCULAR

## 2017-12-27 MED ORDER — POVIDONE-IODINE 5 % OP SOLN
OPHTHALMIC | Status: DC | PRN
  Administered 2017-12-27: 1 via OPHTHALMIC

## 2017-12-27 MED ORDER — LACTATED RINGERS IV SOLN
INTRAVENOUS | Status: DC
  Administered 2017-12-27: 10:00:00 via INTRAVENOUS

## 2017-12-27 MED ORDER — NEOMYCIN-POLYMYXIN-DEXAMETH 3.5-10000-0.1 OP SUSP
OPHTHALMIC | Status: DC | PRN
  Administered 2017-12-27: 2 [drp] via OPHTHALMIC

## 2017-12-27 MED ORDER — EPINEPHRINE PF 1 MG/ML IJ SOLN
INTRAOCULAR | Status: DC | PRN
  Administered 2017-12-27: 500 mL

## 2017-12-27 MED ORDER — SODIUM HYALURONATE 23 MG/ML IO SOLN
INTRAOCULAR | Status: DC | PRN
  Administered 2017-12-27: 0.6 mL via INTRAOCULAR

## 2017-12-27 MED ORDER — CYCLOPENTOLATE-PHENYLEPHRINE 0.2-1 % OP SOLN
1.0000 [drp] | OPHTHALMIC | Status: AC
  Administered 2017-12-27 (×3): 1 [drp] via OPHTHALMIC

## 2017-12-27 SURGICAL SUPPLY — 15 items
CLOTH BEACON ORANGE TIMEOUT ST (SAFETY) ×1 IMPLANT
EYE SHIELD UNIVERSAL CLEAR (GAUZE/BANDAGES/DRESSINGS) ×1 IMPLANT
GLOVE BIOGEL PI IND STRL 6.5 (GLOVE) IMPLANT
GLOVE BIOGEL PI INDICATOR 6.5 (GLOVE) ×1
GLOVE SS BIOGEL STRL SZ 6.5 (GLOVE) IMPLANT
GLOVE SUPERSENSE BIOGEL SZ 6.5 (GLOVE) ×1
LENS ALC ACRYL/TECN (Ophthalmic Related) ×1 IMPLANT
NDL HYPO 18GX1.5 BLUNT FILL (NEEDLE) IMPLANT
NEEDLE HYPO 18GX1.5 BLUNT FILL (NEEDLE) ×2 IMPLANT
PAD ARMBOARD 7.5X6 YLW CONV (MISCELLANEOUS) ×1 IMPLANT
SYR TB 1ML LL NO SAFETY (SYRINGE) ×1 IMPLANT
TAPE SURG TRANSPORE 1 IN (GAUZE/BANDAGES/DRESSINGS) IMPLANT
TAPE SURGICAL TRANSPORE 1 IN (GAUZE/BANDAGES/DRESSINGS) ×1
VISCOELASTIC ADDITIONAL (OPHTHALMIC RELATED) ×1 IMPLANT
WATER STERILE IRR 250ML POUR (IV SOLUTION) ×1 IMPLANT

## 2017-12-27 NOTE — Op Note (Signed)
Date of procedure: 12/27/17  Pre-operative diagnosis: Visually significant cataract, Left Eye (H25.812)  Post-operative diagnosis: Visually significant cataract, Left Eye  Procedure: Removal of cataract via phacoemulsification and insertion of intra-ocular lens [employer] PCB00  +21.0D into the capsular bag of the Left Eye  Attending surgeon: Gerda Diss. Demetrius Mahler, MD, MA  Anesthesia: MAC, Topical Akten  Complications: None  Estimated Blood Loss: <62m (minimal)  Specimens: None  Implants: As above  Indications:  Visually significant cataract, Left Eye  Procedure:  The patient was seen and identified in the pre-operative area. The operative eye was identified and dilated.  The operative eye was marked.  Topical anesthesia was administered to the operative eye.     The patient was then to the operative suite and placed in the supine position.  A timeout was performed confirming the patient, procedure to be performed, and all other relevant information.   The patient's face was prepped and draped in the usual fashion for intra-ocular surgery.  A lid speculum was placed into the operative eye and the surgical microscope moved into place and focused.  An inferotemporal paracentesis was created using a 20 gauge paracentesis blade.  Shugarcaine was injected into the anterior chamber.  Viscoelastic was injected into the anterior chamber.  A temporal clear-corneal main wound incision was created using a 2.488mmicrokeratome.  A continuous curvilinear capsulorrhexis was initiated using an irrigating cystitome and completed using capsulorrhexis forceps.  Hydrodissection and hydrodeliniation were performed.  Viscoelastic was injected into the anterior chamber.  A phacoemulsification handpiece and a chopper as a second instrument were used to remove the nucleus and epinucleus. The irrigation/aspiration handpiece was used to remove any remaining cortical material.   The capsular bag was  reinflated with viscoelastic, checked, and found to be intact.  The intraocular lens was inserted into the capsular bag and dialed into place using a Kuglen hook.  The irrigation/aspiration handpiece was used to remove any remaining viscoelastic.  The clear corneal wound and paracentesis wounds were then hydrated and checked with Weck-Cels to be watertight.  The lid-speculum and drape was removed, and the patient's face was cleaned with a wet and dry 4x4.  Maxitrol was instilled in the eye before a clear shield was taped over the eye. The patient was taken to the post-operative care unit in good condition, having tolerated the procedure well.  Post-Op Instructions: [facility] and will receive all other orders and instructions.

## 2017-12-27 NOTE — Anesthesia Preprocedure Evaluation (Signed)
Anesthesia Evaluation  Patient identified by MRN, date of birth, ID band Patient awake    Reviewed: Allergy & Precautions, H&P , NPO status , Patient's Chart, lab work & pertinent test results, reviewed documented beta blocker date and time   Airway Mallampati: II  TM Distance: >3 FB Neck ROM: full    Dental no notable dental hx. (+) Teeth Intact   Pulmonary neg pulmonary ROS,    Pulmonary exam normal breath sounds clear to auscultation       Cardiovascular Exercise Tolerance: Good hypertension,  Rhythm:regular Rate:Normal     Neuro/Psych negative neurological ROS  negative psych ROS   GI/Hepatic negative GI ROS, Neg liver ROS,   Endo/Other  negative endocrine ROSdiabetes  Renal/GU negative Renal ROS  negative genitourinary   Musculoskeletal   Abdominal   Peds  Hematology negative hematology ROS (+)   Anesthesia Other Findings No clinical complaints EKG... NSR with no acute changes BS= 120  Reproductive/Obstetrics negative OB ROS                             Anesthesia Physical Anesthesia Plan  ASA: II  Anesthesia Plan: MAC   Post-op Pain Management:    Induction:   PONV Risk Score and Plan:   Airway Management Planned:   Additional Equipment:   Intra-op Plan:   Post-operative Plan:   Informed Consent: I have reviewed the patients History and Physical, chart, labs and discussed the procedure including the risks, benefits and alternatives for the proposed anesthesia with the patient or authorized representative who has indicated his/her understanding and acceptance.   Dental Advisory Given  Plan Discussed with: CRNA  Anesthesia Plan Comments:         Anesthesia Quick Evaluation

## 2017-12-27 NOTE — H&P (Signed)
The H and P was reviewed and updated. The patient was examined.  No changes were found after exam.  The surgical eye was marked.  

## 2017-12-27 NOTE — Discharge Instructions (Signed)
Please discharge patient when stable, will follow up today with Dr. Ivaan Liddy at the [facility] office immediately following discharge.  Leave shield in place until visit.  All paperwork with discharge instructions will be given at the office. ° ° °Monitored Anesthesia Care, Care After °These instructions provide you with information about caring for yourself after your procedure. Your health care provider may also give you more specific instructions. Your treatment has been planned according to current medical practices, but problems sometimes occur. Call your health care provider if you have any problems or questions after your procedure. °What can I expect after the procedure? °After your procedure, it is common to: °· Feel sleepy for several hours. °· Feel clumsy and have poor balance for several hours. °· Feel forgetful about what happened after the procedure. °· Have poor judgment for several hours. °· Feel nauseous or vomit. °· Have a sore throat if you had a breathing tube during the procedure. ° °Follow these instructions at home: °For at least 24 hours after the procedure: ° °· Do not: °? Participate in activities in which you could fall or become injured. °? Drive. °? Use heavy machinery. °? Drink alcohol. °? Take sleeping pills or medicines that cause drowsiness. °? Make important decisions or sign legal documents. °? Take care of children on your own. °· Rest. °Eating and drinking °· Follow the diet that is recommended by your health care provider. °· If you vomit, drink water, juice, or soup when you can drink without vomiting. °· Make sure you have little or no nausea before eating solid foods. °General instructions °· Have a responsible adult stay with you until you are awake and alert. °· Take over-the-counter and prescription medicines only as told by your health care provider. °· If you smoke, do not smoke without supervision. °· Keep all follow-up visits as told by your health care  provider. This is important. °Contact a health care provider if: °· You keep feeling nauseous or you keep vomiting. °· You feel light-headed. °· You develop a rash. °· You have a fever. °Get help right away if: °· You have trouble breathing. °This information is not intended to replace advice given to you by your health care provider. Make sure you discuss any questions you have with your health care provider. °Document Released: 12/04/2015 Document Revised: 04/04/2016 Document Reviewed: 12/04/2015 °Elsevier Interactive Patient Education © 2018 Elsevier Inc. ° °

## 2017-12-27 NOTE — Transfer of Care (Signed)
Immediate Anesthesia Transfer of Care Note  Patient: [name]  Procedure(s) Performed: CATARACT EXTRACTION PHACO AND INTRAOCULAR LENS PLACEMENT (IOC) (Left Eye)  Patient Location: Short Stay  Anesthesia Type:MAC  Level of Consciousness: awake  Airway & Oxygen Therapy: Patient Spontanous Breathing  Post-op Assessment: Report given to RN  Post vital signs: Reviewed and stable  Last Vitals:  Vitals Value Taken Time  BP    Temp    Pulse    Resp    SpO2      Last Pain:  Vitals:   12/27/17 0945  TempSrc: Oral  PainSc: 0-No pain         Complications: No apparent anesthesia complications

## 2017-12-27 NOTE — Anesthesia Postprocedure Evaluation (Signed)
Anesthesia Post Note  Patient: [name]  Procedure(s) Performed: CATARACT EXTRACTION PHACO AND INTRAOCULAR LENS PLACEMENT (IOC) (Left Eye)  Patient location during evaluation: Short Stay Anesthesia Type: MAC Level of consciousness: awake and alert and oriented Pain management: pain level controlled Vital Signs Assessment: post-procedure vital signs reviewed and stable Respiratory status: spontaneous breathing Cardiovascular status: blood pressure returned to baseline and stable Postop Assessment: no apparent nausea or vomiting Anesthetic complications: no     Last Vitals:  Vitals:   12/27/17 1015 12/27/17 1020  BP: (!) 142/61 139/62  Pulse:    Resp: 16 16  Temp:    SpO2: 100% 99%    Last Pain:  Vitals:   12/27/17 0945  TempSrc: Oral  PainSc: 0-No pain                 Jeimy Bickert

## 2017-12-30 ENCOUNTER — Encounter (HOSPITAL_COMMUNITY): Payer: Self-pay | Admitting: Ophthalmology

## 2018-03-06 ENCOUNTER — Ambulatory Visit: Payer: Medicare HMO | Admitting: Family

## 2018-03-06 ENCOUNTER — Encounter (HOSPITAL_COMMUNITY): Payer: Medicare HMO

## 2018-03-13 ENCOUNTER — Other Ambulatory Visit: Payer: Self-pay | Admitting: Cardiovascular Disease

## 2018-04-25 DIAGNOSIS — Z961 Presence of intraocular lens: Secondary | ICD-10-CM | POA: Diagnosis not present

## 2018-04-25 DIAGNOSIS — H40023 Open angle with borderline findings, high risk, bilateral: Secondary | ICD-10-CM | POA: Diagnosis not present

## 2018-04-25 DIAGNOSIS — H11153 Pinguecula, bilateral: Secondary | ICD-10-CM | POA: Diagnosis not present

## 2018-04-25 DIAGNOSIS — H11133 Conjunctival pigmentations, bilateral: Secondary | ICD-10-CM | POA: Diagnosis not present

## 2018-04-25 DIAGNOSIS — H26493 Other secondary cataract, bilateral: Secondary | ICD-10-CM | POA: Diagnosis not present

## 2018-04-25 DIAGNOSIS — H18413 Arcus senilis, bilateral: Secondary | ICD-10-CM | POA: Diagnosis not present

## 2018-05-01 ENCOUNTER — Encounter (HOSPITAL_COMMUNITY): Payer: Medicare HMO

## 2018-05-01 ENCOUNTER — Ambulatory Visit: Payer: Medicare HMO | Admitting: Family

## 2018-05-07 DIAGNOSIS — D171 Benign lipomatous neoplasm of skin and subcutaneous tissue of trunk: Secondary | ICD-10-CM | POA: Diagnosis not present

## 2018-05-07 DIAGNOSIS — Z6837 Body mass index (BMI) 37.0-37.9, adult: Secondary | ICD-10-CM | POA: Diagnosis not present

## 2018-05-28 DIAGNOSIS — R69 Illness, unspecified: Secondary | ICD-10-CM | POA: Diagnosis not present

## 2018-06-13 ENCOUNTER — Encounter (HOSPITAL_COMMUNITY): Payer: Medicare HMO

## 2018-06-13 ENCOUNTER — Inpatient Hospital Stay (HOSPITAL_COMMUNITY): Admission: RE | Admit: 2018-06-13 | Payer: Medicare HMO | Source: Ambulatory Visit

## 2018-06-13 ENCOUNTER — Ambulatory Visit: Payer: Medicare HMO | Admitting: Family

## 2018-06-13 ENCOUNTER — Other Ambulatory Visit: Payer: Self-pay | Admitting: Cardiovascular Disease

## 2018-06-26 ENCOUNTER — Encounter: Payer: Self-pay | Admitting: Family

## 2018-06-26 ENCOUNTER — Ambulatory Visit (INDEPENDENT_AMBULATORY_CARE_PROVIDER_SITE_OTHER)
Admission: RE | Admit: 2018-06-26 | Discharge: 2018-06-26 | Disposition: A | Discharge status: Home / Self Care | Payer: Medicare HMO | Source: Ambulatory Visit | Attending: Vascular Surgery | Admitting: Vascular Surgery

## 2018-06-26 ENCOUNTER — Other Ambulatory Visit: Payer: Self-pay

## 2018-06-26 ENCOUNTER — Ambulatory Visit: Payer: Medicare HMO | Admitting: Family

## 2018-06-26 ENCOUNTER — Ambulatory Visit (HOSPITAL_COMMUNITY)
Admission: RE | Admit: 2018-06-26 | Discharge: 2018-06-26 | Disposition: A | Discharge status: Home / Self Care | Payer: Medicare HMO | Source: Ambulatory Visit | Attending: Vascular Surgery | Admitting: Vascular Surgery

## 2018-06-26 VITALS — BP 156/76 | HR 67 | Temp 97.6°F | Resp 14 | Ht 62.5 in | Wt 208.0 lb

## 2018-06-26 DIAGNOSIS — I779 Disorder of arteries and arterioles, unspecified: Secondary | ICD-10-CM | POA: Diagnosis not present

## 2018-06-26 DIAGNOSIS — I739 Peripheral vascular disease, unspecified: Secondary | ICD-10-CM | POA: Diagnosis present

## 2018-06-26 DIAGNOSIS — I6523 Occlusion and stenosis of bilateral carotid arteries: Secondary | ICD-10-CM

## 2018-06-26 NOTE — Patient Instructions (Signed)
Peripheral Vascular Disease Peripheral vascular disease (PVD) is a disease of the blood vessels that are not part of your heart and brain. A simple term for PVD is poor circulation. In most cases, PVD narrows the blood vessels that carry blood from your heart to the rest of your body. This can result in a decreased supply of blood to your arms, legs, and internal organs, like your stomach or kidneys. However, it most often affects a person's lower legs and feet. There are two types of PVD.  Organic PVD. This is the more common type. It is caused by damage to the structure of blood vessels.  Functional PVD. This is caused by conditions that make blood vessels contract and tighten (spasm).  Without treatment, PVD tends to get worse over time. PVD can also lead to acute ischemic limb. This is when an arm or limb suddenly has trouble getting enough blood. This is a medical emergency. Follow these instructions at home:  Take medicines only as told by your doctor.  Do not use any tobacco products, including cigarettes, chewing tobacco, or electronic cigarettes. If you need help quitting, ask your doctor.  Lose weight if you are overweight, and maintain a healthy weight as told by your doctor.  Eat a diet that is low in fat and cholesterol. If you need help, ask your doctor.  Exercise regularly. Ask your doctor for some good activities for you.  Take good care of your feet. ? Wear comfortable shoes that fit well. ? Check your feet often for any cuts or sores. Contact a doctor if:  You have cramps in your legs while walking.  You have leg pain when you are at rest.  You have coldness in a leg or foot.  Your skin changes.  You are unable to get or have an erection (erectile dysfunction).  You have cuts or sores on your feet that are not healing. Get help right away if:  Your arm or leg turns cold and blue.  Your arms or legs become red, warm, swollen, painful, or numb.  You have  chest pain or trouble breathing.  You suddenly have weakness in your face, arm, or leg.  You become very confused or you cannot speak.  You suddenly have a very bad headache.  You suddenly cannot see. This information is not intended to replace advice given to you by your health care provider. Make sure you discuss any questions you have with your health care provider. Document Released: 11/07/2009 Document Revised: 01/19/2016 Document Reviewed: 01/21/2014 Elsevier Interactive Patient Education  2017 Elsevier Inc.       Stroke Prevention Some health problems and behaviors may make it more likely for you to have a stroke. Below are ways to lessen your risk of having a stroke.  Be active for at least 30 minutes on most or all days.  Do not smoke. Try not to be around others who smoke.  Do not drink too much alcohol. ? Do not have more than 2 drinks a day if you are a man. ? Do not have more than 1 drink a day if you are a woman and are not pregnant.  Eat healthy foods, such as fruits and vegetables. If you were put on a specific diet, follow the diet as told.  Keep your cholesterol levels under control through diet and medicines. Look for foods that are low in saturated fat, trans fat, cholesterol, and are high in fiber.  If you have diabetes, follow   all diet plans and take your medicine as told.  Ask your doctor if you need treatment to lower your blood pressure. If you have high blood pressure (hypertension), follow all diet plans and take your medicine as told by your doctor.  If you are 18-39 years old, have your blood pressure checked every 3-5 years. If you are age 40 or older, have your blood pressure checked every year.  Keep a healthy weight. Eat foods that are low in calories, salt, saturated fat, trans fat, and cholesterol.  Do not take drugs.  Avoid birth control pills, if this applies. Talk to your doctor about the risks of taking birth control pills.  Talk to  your doctor if you have sleep problems (sleep apnea).  Take all medicine as told by your doctor. ? You may be told to take aspirin or blood thinner medicine. Take this medicine as told by your doctor. ? Understand your medicine instructions.  Make sure any other conditions you have are being taken care of.  Get help right away if:  You suddenly lose feeling (you feel numb) or have weakness in your face, arm, or leg.  Your face or eyelid hangs down to one side.  You suddenly feel confused.  You have trouble talking (aphasia) or understanding what people are saying.  You suddenly have trouble seeing in one or both eyes.  You suddenly have trouble walking.  You are dizzy.  You lose your balance or your movements are clumsy (uncoordinated).  You suddenly have a very bad headache and you do not know the cause.  You have new chest pain.  Your heart feels like it is fluttering or skipping a beat (irregular heartbeat). Do not wait to see if the symptoms above go away. Get help right away. Call your local emergency services (911 in [place] Do not drive yourself to the hospital. This information is not intended to replace advice given to you by your health care provider. Make sure you discuss any questions you have with your health care provider. Document Released: 02/12/2012 Document Revised: 01/19/2016 Document Reviewed: 02/13/2013 Elsevier Interactive Patient Education  2018 Elsevier Inc.  

## 2018-06-26 NOTE — Progress Notes (Signed)
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 female who was referred by Dr. Pleas Koch for evaluation of leg pain with abnormal ABIs.   She does not really describe calf pain. She denies rest pain in the foot. She does not have a history of nonhealing wounds. She does have a history of coronary artery disease and had coronary bypass grafting in 2013. Bilateral greater saphenous vein was harvested for that procedure. Of note in review of her records she also had a moderate carotid stenosis at the time of her evaluation for coronary bypass grafting. She does deny any symptoms of TIA amaurosis or stroke. She is on aspirin and a statin. Other chronic medical problems include chronic back pain, arthritis, diabetes, hyperlipidemia, obesity, hypertension or which have been stable.  Dr. Oneida Alar last evaluated pt on 02-28-17. At that time bilateral ABIs from 12/29/2016 were 0.88 on the right 0.71 on the left. Patient had a arterial duplex exam that day which showed triphasic waveforms on the right side and triphasic waveforms above the knee on the left side. She did have biphasic waveforms of the left popliteal level and monophasic at the tibial level suggesting some element of popliteal artery stenosis on the left side. She also had occlusion of the posterior tibial artery bilaterally by duplex. Patient had some element of peripheral arterial disease with decreased ABIs bilaterally. She does had some difficulty walking but some of her symptoms could be secondary to her back pain rather than peripheral arterial disease. She may also have an element of neuropathy. Overall she does not seem very debilitated by this. Dr. Oneida Alar discussed with her the possibility of an arteriogram for further evaluation. However she did not really feel that limited by her symptoms and was okay at that time with continued  conservative management and observation. ABIs in one year. If she develops worsening leg pain symptoms or nonhealing wound she was to return sooner, see our nurse practitioner at that office visit. Carotid occlusive disease: patient had a moderate carotid stenosis by duplex ultrasound 2013. Although she has remained asymptomatic she probably warrants at least a repeat carotid duplex exam to make sure that this has not progressed. This was to be  obtained at her next office visit in 6 months as well. She was to continue to take her aspirin and statin.  Her blood pressure was 141/82 at her February 2019 visit with Dr. Harl Bowie, is elevated now. Pt denies headache, denies chest pain, denies dyspnea.  She denies any known history of stroke or TIA. Specifically she denies a history of amaurosis fugax or monocular blindness, unilateral facial drooping, hemiplegia, or receptive or expressive aphasia.    She walks 10 minutes daily. She denies pain or weakness in her calves, thighs, or hips with walking. She does have right foot tingling, that is relieved by a topical preparation.   Diabetic: yes, pt states last A1C was 6.? Tobacco use: non-smoker  Pt meds include: Statin : yes ASA: yes Other anticoagulants/antiplatelets: no   Past Medical History:  Diagnosis Date  . Aortic stenosis   . Arthritis   . AV block, 1st degree   . Back pain    occasionally with weather changes  . CAD (coronary artery disease)    Non-STEMI/[facility]us stenting 100% left anterior descending (2), January 2008. Residual 70% circumflex;80% right coronary artery. Mild LVD (EF 45%);improved to 55-60% by 2-D  echocardiogram,January 2009.   . DM (diabetes mellitus) ([place])    takes Metformin 1000mg  BID  . Dyslipidemia    takes Pravastatin daily  . Early cataracts, bilateral   . Gout    takes Allopurinol daily  . Herniated disc    left  . Lumbar herniated disc   . Myocardial infarction [facility]) 2008   pt has 2 stents  . Nocturia   .  Obesity   . Unspecified essential hypertension    takes Metoprolol,Lisinopril,and Amlodipine  . Urinary frequency     Social History Social History   Tobacco Use  . Smoking status: Never Smoker  . Smokeless tobacco: Never Used  Substance Use Topics  . Alcohol use: No    Alcohol/week: 0.0 standard drinks  . Drug use: No    Family History Family History  Problem Relation Age of Onset  . Heart attack Sister 51  . Heart attack Brother 57  . Anesthesia problems Neg Hx   . Hypotension Neg Hx   . Malignant hyperthermia Neg Hx   . Pseudochol deficiency Neg Hx     Surgical History Past Surgical History:  Procedure Laterality Date  . ABDOMINAL HYSTERECTOMY    . BACK SURGERY  2009/2010/2011  . BREAST BIOPSY  unknown   left  . CARDIAC CATHETERIZATION  2008   NON-STEMI/TAXUS STENTING 100% PROXIMAL LAD (X2) JANUARY 2008  . CARDIAC CATHETERIZATION  2012   occluded LAD stents, Om1 : 70%, RCA: 80%, Ef 40-45%  . CARPAL TUNNEL RELEASE     bilateral  . CATARACT EXTRACTION W/PHACO Right 10/11/2017   Procedure: CATARACT EXTRACTION PHACO AND INTRAOCULAR LENS PLACEMENT RIGHT EYE;  Surgeon: Baruch Goldmann, MD;  Location: [facility];  Service: Ophthalmology;  Laterality: Right;  CDE: 5.17  . CATARACT EXTRACTION W/PHACO Left 12/27/2017   Procedure: CATARACT EXTRACTION PHACO AND INTRAOCULAR LENS PLACEMENT (IOC);  Surgeon: Baruch Goldmann, MD;  Location: [facility];  Service: Ophthalmology;  Laterality: Left;  CDE: 2.99  . CHOLECYSTECTOMY  not sure  . COLONOSCOPY    . CORONARY ARTERY BYPASS GRAFT  09/05/2011   Procedure: CORONARY ARTERY BYPASS GRAFTING (CABG);  Surgeon: Grace Isaac, MD;  Location: [place];  Service: Open Heart Surgery;  Laterality: N/A;  CABG times three using left internal mammary artery and left leg greater saphenous vein harvested endoscopically  . LEG TENDON SURGERY  unknown   left   . LUMBAR LAMINECTOMY/DECOMPRESSION MICRODISCECTOMY Left 06/04/2014   Procedure: LUMBAR  LAMINECTOMY/DECOMPRESSION MICRODISCECTOMY LUMBAR FOUR-FIVE;  Surgeon: Elaina Hoops, MD;  Location: [facility];  Service: Neurosurgery;  Laterality: Left;  . TONSILLECTOMY      No Known Allergies  Current Outpatient Medications  Medication Sig Dispense Refill  . acetaminophen (TYLENOL) 500 MG tablet Take 1,000 mg by mouth 2 (two) times daily as needed for moderate pain or headache.    . allopurinol (ZYLOPRIM) 100 MG tablet Take 100 mg by mouth at bedtime.     [name] amLODipine (NORVASC) 10 MG tablet TAKE 1 TABLET EVERY DAY  *DOSE  INCREASE* 90 tablet 3  . aspirin 81 MG tablet Take 81 mg by mouth at bedtime.     . cholecalciferol (VITAMIN D) 1000 units tablet Take 1,000 Units by mouth daily.    . colchicine 0.6 MG tablet Take one tablet PO and may repeat in one hour if no improvement. Then take one tablet every 3 hours until pain is relieved. 30 tablet 0  . diclofenac sodium (VOLTAREN) 1 % GEL APPLY 4 GRAMS  TOPICALLY EVERY 6 HOURS AS NEEDED  1  . lisinopril (PRINIVIL,ZESTRIL) 40 MG tablet Take 1 tablet (40 mg total) by mouth daily. 90 tablet 3  . Menthol, Topical Analgesic, (ICY HOT EX) Apply 1 application topically daily as needed (muscle pain).    . metFORMIN (GLUCOPHAGE) 500 MG tablet Take 500 mg by mouth 2 (two) times daily with a meal.     . metoprolol tartrate (LOPRESSOR) 25 MG tablet TAKE 1 TABLET BY MOUTH TWICE A DAY 180 tablet 1  . nitroGLYCERIN (NITROSTAT) 0.4 MG SL tablet PLACE 1 TABLET  UNDER THE TONGUE EVERY 5 MINUTES AS NEEDED FOR CHEST PAIN. 25 tablet 3  . Omega-3 Fatty Acids (OMEGA-3 FISH OIL) 300 MG CAPS Take 300 mg by mouth daily.    . pravastatin (PRAVACHOL) 40 MG tablet Take 40 mg by mouth at bedtime.     [name] tiZANidine (ZANAFLEX) 4 MG tablet Take 4 mg by mouth at bedtime as needed for muscle spasms.  0  . vitamin B-12 (CYANOCOBALAMIN) 500 MCG tablet Take 500 mcg by mouth daily.     No current facility-administered medications for this visit.     Review of Systems : See HPI  for pertinent positives and negatives.  Physical Examination  Vitals:   06/26/18 1122 06/26/18 1124  BP: (!) 172/79 (!) 156/76  Pulse: 68 67  Resp: 14   Temp: 97.6 F (36.4 C)   TempSrc: Oral   SpO2: 100%   Weight: 208 lb (94.3 kg)   Height: 5' 2.5" (1.588 m)    Body mass index is 37.44 kg/m.  General: WDWN obese female in NAD GAIT: normal Eyes: PERRLA HENT: No gross abnormalities.  Pulmonary:  Respirations are non-labored, good air movement in all fields, CTAB, rales, rhonchi, or wheezes. Cardiac: regular rhythm, no detected murmur.  VASCULAR EXAM Carotid Bruits Right Left   Positive Negative     Abdominal aortic pulse is not palpable. Radial pulses are 2+ palpable.                                                                                                                            LE Pulses Right Left       FEMORAL  2+ palpable  1+ palpable        POPLITEAL  not palpable   not palpable       POSTERIOR TIBIAL  1+ palpable   not palpable        DORSALIS PEDIS      ANTERIOR TIBIAL 2+ palpable  not palpable     Gastrointestinal: soft, nontender, BS WNL, no r/g, no palpable masses. Musculoskeletal: no muscle atrophy/wasting. M/S 5/5 throughout, extremities without ischemic changes. Skin: No rashes, no ulcers, no cellulitis.   Neurologic:  A&O X 3; appropriate affect, sensation is normal; speech is normal, CN 2-12 intact, pain and light touch intact in extremities, motor exam as listed above. Psychiatric: Normal thought content, mood appropriate to clinical situation.  Assessment: [name] is a 72 y.o. female who returns for evaluation of asymptomatic carotid artery stenosis and minimally symptomatic peripheral artery occlusive disease. She has no history of stroke or TIA. There are no signs of ischemia in her feet or legs.  The tingling and numbness in her right foot is relieved by a topical preparation.   Fortunately she has never used tobacco  and her DM seems to be in good control.  Her atherosclerotic risk factors include CAD, obesity, dyslipidemia, and hypertension. She takes a daily statin and ASA.     DATA  Carotid Duplex (06-26-18); Right ICA: 40-59% stenosis Left ICA: 1-39% stenosis Bilateral vertebral artery flow is antegrade.  Bilateral subclavian artery waveforms are normal.  No significant change from the exam in 2013.   ABI (Date: 06/26/2018):  R:   ABI: 0.92 (was 0.88 on 12-29-16),   PT: bi  DP: bi  TBI:  0.66, toe pressure 113  L:   ABI: 0.69 (was 0.71),   PT: mono  DP: bi  TBI: 0.51, toe pressure 87 Stable bilateral ABI with mild disease in the right, moderate disease in the left; biphasic waveforms in the right, bi and monophasic in the left.    Plan: Follow-up in 1 year with Carotid Duplex scan and ABI's.  Walk at least 30 minutes total daily in a safe environment.    I discussed in depth with the patient the nature of atherosclerosis, and emphasized the importance of maximal medical management including strict control of blood pressure, blood glucose, and lipid levels, obtaining regular exercise, and continued cessation of smoking.  The patient is aware that without maximal medical management the underlying atherosclerotic disease process will progress, limiting the benefit of any interventions. The patient was given information about stroke prevention and what symptoms should prompt the patient to seek immediate medical care. Thank you for allowing [place] to participate in this patient's care.  Clemon Chambers, RN, MSN, FNP-C [facility] Office: [phone]  Clinic Physician: Scot Dock in vein clinic, Carlis Abbott on call  06/26/18 11:45 AM

## 2018-06-30 DIAGNOSIS — E1122 Type 2 diabetes mellitus with diabetic chronic kidney disease: Secondary | ICD-10-CM | POA: Diagnosis not present

## 2018-06-30 DIAGNOSIS — E559 Vitamin D deficiency, unspecified: Secondary | ICD-10-CM | POA: Diagnosis not present

## 2018-06-30 DIAGNOSIS — I10 Essential (primary) hypertension: Secondary | ICD-10-CM | POA: Diagnosis not present

## 2018-06-30 DIAGNOSIS — E1165 Type 2 diabetes mellitus with hyperglycemia: Secondary | ICD-10-CM | POA: Diagnosis not present

## 2018-06-30 DIAGNOSIS — E782 Mixed hyperlipidemia: Secondary | ICD-10-CM | POA: Diagnosis not present

## 2018-06-30 DIAGNOSIS — N182 Chronic kidney disease, stage 2 (mild): Secondary | ICD-10-CM | POA: Diagnosis not present

## 2018-07-01 ENCOUNTER — Ambulatory Visit: Payer: Medicare HMO | Admitting: General Surgery

## 2018-07-04 DIAGNOSIS — E1122 Type 2 diabetes mellitus with diabetic chronic kidney disease: Secondary | ICD-10-CM | POA: Diagnosis not present

## 2018-07-04 DIAGNOSIS — Z1331 Encounter for screening for depression: Secondary | ICD-10-CM | POA: Diagnosis not present

## 2018-07-04 DIAGNOSIS — E1151 Type 2 diabetes mellitus with diabetic peripheral angiopathy without gangrene: Secondary | ICD-10-CM | POA: Diagnosis not present

## 2018-07-04 DIAGNOSIS — N182 Chronic kidney disease, stage 2 (mild): Secondary | ICD-10-CM | POA: Diagnosis not present

## 2018-07-04 DIAGNOSIS — Z1389 Encounter for screening for other disorder: Secondary | ICD-10-CM | POA: Diagnosis not present

## 2018-07-04 DIAGNOSIS — I2510 Atherosclerotic heart disease of native coronary artery without angina pectoris: Secondary | ICD-10-CM | POA: Diagnosis not present

## 2018-07-04 DIAGNOSIS — I10 Essential (primary) hypertension: Secondary | ICD-10-CM | POA: Diagnosis not present

## 2018-07-04 DIAGNOSIS — E782 Mixed hyperlipidemia: Secondary | ICD-10-CM | POA: Diagnosis not present

## 2018-07-08 ENCOUNTER — Encounter: Payer: Self-pay | Admitting: General Surgery

## 2018-07-08 ENCOUNTER — Ambulatory Visit: Payer: Medicare HMO | Admitting: General Surgery

## 2018-07-08 VITALS — BP 181/83 | HR 75 | Temp 96.9°F | Resp 18 | Wt 211.8 lb

## 2018-07-08 DIAGNOSIS — D171 Benign lipomatous neoplasm of skin and subcutaneous tissue of trunk: Secondary | ICD-10-CM | POA: Diagnosis not present

## 2018-07-08 NOTE — Patient Instructions (Signed)
Lipoma Removal Lipoma removal is a surgical procedure to remove a noncancerous (benign) tumor that is made up of fat cells (lipoma). Most lipomas are small and painless and do not require treatment. They can form in many areas of the body but are most common under the skin of the back, shoulders, arms, and thighs. You may need lipoma removal if you have a lipoma that is large, growing, or causing discomfort. Lipoma removal may also be done for cosmetic reasons. Tell a health care provider about:  Any allergies you have.  All medicines you are taking, including vitamins, herbs, eye drops, creams, and over-the-counter medicines.  Any problems you or family members have had with anesthetic medicines.  Any blood disorders you have.  Any surgeries you have had.  Any medical conditions you have.  Whether you are pregnant or may be pregnant. What are the risks? Generally, this is a safe procedure. However, problems may occur, including:  Infection.  Bleeding.  Allergic reactions to medicines.  Damage to nerves or blood vessels near the lipoma.  Scarring.  Medicines  Ask your health care provider about: ? Changing or stopping your regular medicines. This is especially important if you are taking diabetes medicines or blood thinners. ? Taking medicines such as aspirin and ibuprofen. These medicines can thin your blood. Do not take these medicines before your procedure if your health care provider instructs you not to.  You may be given antibiotic medicine to help prevent infection. General instructions  Ask your health care provider how your surgical site will be marked or identified.  You will have a physical exam. Your health care provider will check the size of the lipoma and whether it can be moved easily.  You may have imaging tests, such as: ? X-rays. ? CT scan. ? MRI.  Plan to have someone take you home from the hospital or clinic. What happens during the  procedure?  To reduce your risk of infection: ? Your health care team will wash or sanitize their hands. ? Your skin will be washed with soap.  You will be given one or more of the following: ? A medicine to help you relax (sedative). ? A medicine to numb the area (local anesthetic). ? A medicine to make you fall asleep (general anesthetic). ? A medicine that is injected into an area of your body to numb everything below the injection site (regional anesthetic).  An incision will be made over the lipoma or very near the lipoma. The incision may be made in a natural skin line or crease.  Tissues, nerves, and blood vessels near the lipoma will be moved out of the way.  The lipoma and the capsule that surrounds it will be separated from the surrounding tissues.  The lipoma will be removed.  The incision may be closed with stitches (sutures).  A bandage (dressing) will be placed over the incision. What happens after the procedure?  Do not drive for 24 hours if you received a sedative.  Your blood pressure, heart rate, breathing rate, and blood oxygen level will be monitored until the medicines you were given have worn off. This information is not intended to replace advice given to you by your health care provider. Make sure you discuss any questions you have with your health care provider. Document Released: 10/27/2015 Document Revised: 01/19/2016 Document Reviewed: 10/27/2015 Elsevier Interactive Patient Education  2018 [employer]. Lipoma A lipoma is a noncancerous (benign) tumor that is made up of fat cells.  This is a very common type of soft-tissue growth. Lipomas are usually found under the skin (subcutaneous). They may occur in any tissue of the body that contains fat. Common areas for lipomas to appear include the back, shoulders, buttocks, and thighs. Lipomas grow slowly, and they are usually painless. Most lipomas do not cause problems and do not require treatment. What are  the causes? The cause of this condition is not known. What increases the risk? This condition is more likely to develop in:  People who are 10-80 years old.  People who have a family history of lipomas.  What are the signs or symptoms? A lipoma usually appears as a small, round bump under the skin. It may feel soft or rubbery, but the firmness can vary. Most lipomas are not painful. However, a lipoma may become painful if it is located in an area where it pushes on nerves. How is this diagnosed? A lipoma can usually be diagnosed with a physical exam. You may also have tests to confirm the diagnosis and to rule out other conditions. Tests may include:  Imaging tests, such as a CT scan or MRI.  Removal of a tissue sample to be looked at under a microscope (biopsy).  How is this treated? Treatment is not needed for small lipomas that are not causing problems. If a lipoma continues to get bigger or it causes problems, removal is often the best option. Lipomas can also be removed to improve appearance. Removal of a lipoma is usually done with a surgery in which the fatty cells and the surrounding capsule are removed. Most often, a medicine that numbs the area (local anesthetic) is used for this procedure. Follow these instructions at home:  Keep all follow-up visits as directed by your health care provider. This is important. Contact a health care provider if:  Your lipoma becomes larger or hard.  Your lipoma becomes painful, red, or increasingly swollen. These could be signs of infection or a more serious condition. This information is not intended to replace advice given to you by your health care provider. Make sure you discuss any questions you have with your health care provider. Document Released: 08/03/2002 Document Revised: 01/19/2016 Document Reviewed: 08/09/2014 Elsevier Interactive Patient Education  [employer].

## 2018-07-08 NOTE — Progress Notes (Signed)
[facility] History and Physical  Reason for Referral: Lipoma  Referring Physician: Dr. Pleas Koch   Chief Complaint    Lipoma      [name] is a 72 y.o. female.  HPI: [name] is a very pleasant 72 yo who has noticed an area on her left lower back after losing weight. She says the area has been there for months and that she has been intentionally losing weight.  She has no complaints of any growth or major discomfort, but she has been noticing it more since her weight loss.  She has no other history of any lumps or bumps elsewhere.  She was recently seen by Vascular for PVD and carotid stenosis with repeat imaging indicating no need for intervention and follow up with repeat imaging in 1 year. She denies any symptoms of TIA, CP, SOB.   Past Medical History:  Diagnosis Date  . Aortic stenosis   . Arthritis   . AV block, 1st degree   . Back pain    occasionally with weather changes  . CAD (coronary artery disease)    Non-STEMI/Taxus stenting 100% left anterior descending (2), January 2008. Residual 70% circumflex;80% right coronary artery. Mild LVD (EF 45%);improved to 55-60% by 2-D echocardiogram,January 2009.   . DM (diabetes mellitus) ([place])    takes Metformin 1000mg  BID  . Dyslipidemia    takes Pravastatin daily  . Early cataracts, bilateral   . Gout    takes Allopurinol daily  . Herniated disc    left  . Lumbar herniated disc   . Myocardial infarction [facility]) 2008   pt has 2 stents  . Nocturia   . Obesity   . Unspecified essential hypertension    takes Metoprolol,Lisinopril,and Amlodipine  . Urinary frequency     Past Surgical History:  Procedure Laterality Date  . ABDOMINAL HYSTERECTOMY    . BACK SURGERY  2009/2010/2011  . BREAST BIOPSY  unknown   left  . CARDIAC CATHETERIZATION  2008   NON-STEMI/TAXUS STENTING 100% PROXIMAL LAD (X2) JANUARY 2008  . CARDIAC CATHETERIZATION  2012   occluded LAD stents, Om1 : 70%, RCA: 80%, Ef 40-45%  .  CARPAL TUNNEL RELEASE     bilateral  . CATARACT EXTRACTION W/PHACO Right 10/11/2017   Procedure: CATARACT EXTRACTION PHACO AND INTRAOCULAR LENS PLACEMENT RIGHT EYE;  Surgeon: Baruch Goldmann, MD;  Location: [facility];  Service: Ophthalmology;  Laterality: Right;  CDE: 5.17  . CATARACT EXTRACTION W/PHACO Left 12/27/2017   Procedure: CATARACT EXTRACTION PHACO AND INTRAOCULAR LENS PLACEMENT (IOC);  Surgeon: Baruch Goldmann, MD;  Location: [facility];  Service: Ophthalmology;  Laterality: Left;  CDE: 2.99  . CHOLECYSTECTOMY  not sure  . COLONOSCOPY    . CORONARY ARTERY BYPASS GRAFT  09/05/2011   Procedure: CORONARY ARTERY BYPASS GRAFTING (CABG);  Surgeon: Grace Isaac, MD;  Location: [place];  Service: Open Heart Surgery;  Laterality: N/A;  CABG times three using left internal mammary artery and left leg greater saphenous vein harvested endoscopically  . LEG TENDON SURGERY  unknown   left   . LUMBAR LAMINECTOMY/DECOMPRESSION MICRODISCECTOMY Left 06/04/2014   Procedure: LUMBAR LAMINECTOMY/DECOMPRESSION MICRODISCECTOMY LUMBAR FOUR-FIVE;  Surgeon: Elaina Hoops, MD;  Location: [place] [facility];  Service: Neurosurgery;  Laterality: Left;  . TONSILLECTOMY      Family History  Problem Relation Age of Onset  . Heart attack Sister 80  . Heart attack Brother 67  . Anesthesia problems Neg Hx   . Hypotension Neg Hx   .  Malignant hyperthermia Neg Hx   . Pseudochol deficiency Neg Hx     Social History   Tobacco Use  . Smoking status: Never Smoker  . Smokeless tobacco: Never Used  Substance Use Topics  . Alcohol use: No    Alcohol/week: 0.0 standard drinks  . Drug use: No    Medications: I have reviewed the patient's current medications. Allergies as of 07/08/2018   No Known Allergies     Medication List        Accurate as of 07/08/18 11:59 PM. Always use your most recent med list.          acetaminophen 500 MG tablet Commonly known as:  TYLENOL Take 1,000 mg by mouth 2 (two) times daily as needed  for moderate pain or headache.   allopurinol 100 MG tablet Commonly known as:  ZYLOPRIM Take 100 mg by mouth at bedtime.   amLODipine 10 MG tablet Commonly known as:  NORVASC TAKE 1 TABLET EVERY DAY  *DOSE  INCREASE*   aspirin 81 MG tablet Take 81 mg by mouth at bedtime.   cholecalciferol 1000 units tablet Commonly known as:  VITAMIN D Take 1,000 Units by mouth daily.   colchicine 0.6 MG tablet Take one tablet PO and may repeat in one hour if no improvement. Then take one tablet every 3 hours until pain is relieved.   diclofenac sodium 1 % Gel Commonly known as:  VOLTAREN APPLY 4 GRAMS TOPICALLY EVERY 6 HOURS AS NEEDED   ICY HOT EX Apply 1 application topically daily as needed (muscle pain).   lisinopril 40 MG tablet Commonly known as:  PRINIVIL,ZESTRIL Take 1 tablet (40 mg total) by mouth daily.   metFORMIN 500 MG tablet Commonly known as:  GLUCOPHAGE Take 500 mg by mouth 2 (two) times daily with a meal.   metoprolol tartrate 25 MG tablet Commonly known as:  LOPRESSOR TAKE 1 TABLET BY MOUTH TWICE A DAY   nitroGLYCERIN 0.4 MG SL tablet Commonly known as:  NITROSTAT PLACE 1 TABLET  UNDER THE TONGUE EVERY 5 MINUTES AS NEEDED FOR CHEST PAIN.   Omega-3 Fish Oil 300 MG Caps Take 300 mg by mouth daily.   pravastatin 40 MG tablet Commonly known as:  PRAVACHOL Take 40 mg by mouth at bedtime.   tiZANidine 4 MG tablet Commonly known as:  ZANAFLEX Take 4 mg by mouth at bedtime as needed for muscle spasms.   vitamin B-12 500 MCG tablet Commonly known as:  CYANOCOBALAMIN Take 500 mcg by mouth daily.        ROS:  A comprehensive review of systems was negative except for: Cardiovascular: positive for HTN Musculoskeletal: positive for back pain and stiff joints  Blood pressure (!) 181/83, pulse 75, temperature (!) 96.9 F (36.1 C), temperature source Temporal, resp. rate 18, weight 211 lb 12.8 oz (96.1 kg). Physical Exam  Constitutional: She is oriented to person,  place, and time. She appears well-developed and well-nourished.  HENT:  Head: Normocephalic.  Eyes: Pupils are equal, round, and reactive to light.  Neck: Normal range of motion.  Cardiovascular: Normal rate.  Pulmonary/Chest: Effort normal and breath sounds normal.  Abdominal: Soft. She exhibits no distension. There is no tenderness.  Lower left back with 5cm mobile area, nontender, feels possibly under muscle  Musculoskeletal: Normal range of motion.  Neurological: She is alert and oriented to person, place, and time.  Skin: Skin is warm and dry.  Psychiatric: She has a normal mood and affect. Her behavior is  normal. Judgment and thought content normal.  Vitals reviewed.   Results: None    Assessment & Plan:  ESHIKA RECKART is a 72 y.o. female with a 5cm lipoma on her left lower back. She has noticed this since losing weight. She is otherwise doing well. Given the size, it is reasonable to remove.  She is not having any significant pain from the area but it is likely under some muscle tissue and could start causing some back pain if it continues to grow. -Excision of lipoma   All questions were answered to the satisfaction of the patient.  The risk and benefits of excision of the mass were discussed including but not limited to bleeding, infection,  Recurrence.  After careful consideration, [name] has decided to proceed.    [name] 07/09/2018, 1:15 PM

## 2018-07-09 NOTE — H&P (Signed)
[facility] History and Physical  Reason for Referral: Lipoma  Referring Physician: Dr. Pleas Koch      Chief Complaint    Lipoma      [name] is a 72 y.o. female.  HPI: [name] is a very pleasant 72 yo who has noticed an area on her left lower back after losing weight. She says the area has been there for months and that she has been intentionally losing weight.  She has no complaints of any growth or major discomfort, but she has been noticing it more since her weight loss.  She has no other history of any lumps or bumps elsewhere.  She was recently seen by Vascular for PVD and carotid stenosis with repeat imaging indicating no need for intervention and follow up with repeat imaging in 1 year. She denies any symptoms of TIA, CP, SOB.       Past Medical History:  Diagnosis Date  . Aortic stenosis   . Arthritis   . AV block, 1st degree   . Back pain    occasionally with weather changes  . CAD (coronary artery disease)    Non-STEMI/Taxus stenting 100% left anterior descending (2), January 2008. Residual 70% circumflex;80% right coronary artery. Mild LVD (EF 45%);improved to 55-60% by 2-D echocardiogram,January 2009.   . DM (diabetes mellitus) ([place])    takes Metformin 1000mg  BID  . Dyslipidemia    takes Pravastatin daily  . Early cataracts, bilateral   . Gout    takes Allopurinol daily  . Herniated disc    left  . Lumbar herniated disc   . Myocardial infarction [facility]) 2008   pt has 2 stents  . Nocturia   . Obesity   . Unspecified essential hypertension    takes Metoprolol,Lisinopril,and Amlodipine  . Urinary frequency          Past Surgical History:  Procedure Laterality Date  . ABDOMINAL HYSTERECTOMY    . BACK SURGERY  2009/2010/2011  . BREAST BIOPSY  unknown   left  . CARDIAC CATHETERIZATION  2008   NON-STEMI/TAXUS STENTING 100% PROXIMAL LAD (X2) JANUARY 2008  . CARDIAC CATHETERIZATION  2012    occluded LAD stents, Om1 : 70%, RCA: 80%, Ef 40-45%  . CARPAL TUNNEL RELEASE     bilateral  . CATARACT EXTRACTION W/PHACO Right 10/11/2017   Procedure: CATARACT EXTRACTION PHACO AND INTRAOCULAR LENS PLACEMENT RIGHT EYE;  Surgeon: Baruch Goldmann, MD;  [facility]: [facility];  Service: Ophthalmology;  Laterality: Right;  CDE: 5.17  . CATARACT EXTRACTION W/PHACO Left 12/27/2017   Procedure: CATARACT EXTRACTION PHACO AND INTRAOCULAR LENS PLACEMENT (IOC);  Surgeon: Baruch Goldmann, MD;  Location: [facility];  Service: Ophthalmology;  Laterality: Left;  CDE: 2.99  . CHOLECYSTECTOMY  not sure  . COLONOSCOPY    . CORONARY ARTERY BYPASS GRAFT  09/05/2011   Procedure: CORONARY ARTERY BYPASS GRAFTING (CABG);  Surgeon: Grace Isaac, MD;  Location: [place];  Service: Open Heart Surgery;  Laterality: N/A;  CABG times three using left internal mammary artery and left leg greater saphenous vein harvested endoscopically  . LEG TENDON SURGERY  unknown   left   . LUMBAR LAMINECTOMY/DECOMPRESSION MICRODISCECTOMY Left 06/04/2014   Procedure: LUMBAR LAMINECTOMY/DECOMPRESSION MICRODISCECTOMY LUMBAR FOUR-FIVE;  Surgeon: Elaina Hoops, MD;  Location: [place];  Service: Neurosurgery;  Laterality: Left;  . TONSILLECTOMY           Family History  Problem Relation Age of Onset  . Heart attack Sister 35  .  Heart attack Brother 79  . Anesthesia problems Neg Hx   . Hypotension Neg Hx   . Malignant hyperthermia Neg Hx   . Pseudochol deficiency Neg Hx     Social History        Tobacco Use  . Smoking status: Never Smoker  . Smokeless tobacco: Never Used  Substance Use Topics  . Alcohol use: No    Alcohol/week: 0.0 standard drinks  . Drug use: No    Medications: I have reviewed the patient's current medications. Allergies as of 07/08/2018   No Known Allergies              Medication List            Accurate as of 07/08/18 11:59 PM. Always use your most recent med list.            acetaminophen 500 MG tablet Commonly known as:  TYLENOL Take 1,000 mg by mouth 2 (two) times daily as needed for moderate pain or headache.   allopurinol 100 MG tablet Commonly known as:  ZYLOPRIM Take 100 mg by mouth at bedtime.   amLODipine 10 MG tablet Commonly known as:  NORVASC TAKE 1 TABLET EVERY DAY  *DOSE  INCREASE*   aspirin 81 MG tablet Take 81 mg by mouth at bedtime.   cholecalciferol 1000 units tablet Commonly known as:  VITAMIN D Take 1,000 Units by mouth daily.   colchicine 0.6 MG tablet Take one tablet PO and may repeat in one hour if no improvement. Then take one tablet every 3 hours until pain is relieved.   diclofenac sodium 1 % Gel Commonly known as:  VOLTAREN APPLY 4 GRAMS TOPICALLY EVERY 6 HOURS AS NEEDED   ICY HOT EX Apply 1 application topically daily as needed (muscle pain).   lisinopril 40 MG tablet Commonly known as:  PRINIVIL,ZESTRIL Take 1 tablet (40 mg total) by mouth daily.   metFORMIN 500 MG tablet Commonly known as:  GLUCOPHAGE Take 500 mg by mouth 2 (two) times daily with a meal.   metoprolol tartrate 25 MG tablet Commonly known as:  LOPRESSOR TAKE 1 TABLET BY MOUTH TWICE A DAY   nitroGLYCERIN 0.4 MG SL tablet Commonly known as:  NITROSTAT PLACE 1 TABLET  UNDER THE TONGUE EVERY 5 MINUTES AS NEEDED FOR CHEST PAIN.   Omega-3 Fish Oil 300 MG Caps Take 300 mg by mouth daily.   pravastatin 40 MG tablet Commonly known as:  PRAVACHOL Take 40 mg by mouth at bedtime.   tiZANidine 4 MG tablet Commonly known as:  ZANAFLEX Take 4 mg by mouth at bedtime as needed for muscle spasms.   vitamin B-12 500 MCG tablet Commonly known as:  CYANOCOBALAMIN Take 500 mcg by mouth daily.        ROS:  A comprehensive review of systems was negative except for: Cardiovascular: positive for HTN Musculoskeletal: positive for back pain and stiff joints  Blood pressure (!) 181/83, pulse 75, temperature (!) 96.9 F (36.1  C), temperature source Temporal, resp. rate 18, weight 211 lb 12.8 oz (96.1 kg). Physical Exam  Constitutional: She is oriented to person, place, and time. She appears well-developed and well-nourished.  HENT:  Head: Normocephalic.  Eyes: Pupils are equal, round, and reactive to light.  Neck: Normal range of motion.  Cardiovascular: Normal rate.  Pulmonary/Chest: Effort normal and breath sounds normal.  Abdominal: Soft. She exhibits no distension. There is no tenderness.  Lower left back with 5cm mobile area, nontender, feels possibly under  muscle  Musculoskeletal: Normal range of motion.  Neurological: She is alert and oriented to person, place, and time.  Skin: Skin is warm and dry.  Psychiatric: She has a normal mood and affect. Her behavior is normal. Judgment and thought content normal.  Vitals reviewed.   Results: None    Assessment & Plan:  [name] is a 72 y.o. female with a 5cm lipoma on her left lower back. She has noticed this since losing weight. She is otherwise doing well. Given the size, it is reasonable to remove.  She is not having any significant pain from the area but it is likely under some muscle tissue and could start causing some back pain if it continues to grow. -Excision of lipoma   All questions were answered to the satisfaction of the patient.  The risk and benefits of excision of the mass were discussed including but not limited to bleeding, infection,  Recurrence.  After careful consideration, [name] has decided to proceed.    Virl Cagey 07/09/2018, 1:15 PM

## 2018-07-22 ENCOUNTER — Other Ambulatory Visit (HOSPITAL_COMMUNITY): Payer: Medicare HMO

## 2018-07-28 NOTE — Patient Instructions (Signed)
[name]  07/28/2018     @PREFPERIOPPHARMACY @   Your procedure is scheduled on  08/08/2018 .  Report to [facility] at  [place].M.  Call this number if you have problems the morning of surgery:  [phone]   Remember:  Do not eat or drink after midnight.                       Take these medicines the morning of surgery with A SIP OF WATER  Allopurinol or colchicine, amlodipine, lisinopril, metoprolol.    Do not wear jewelry, make-up or nail polish.  Do not wear lotions, powders, or perfumes, or deodorant.  Do not shave 48 hours prior to surgery.  Men may shave face and neck.  Do not bring valuables to the hospital.  [facility] is not responsible for any belongings or valuables.  Contacts, dentures or bridgework may not be worn into surgery.  Leave your suitcase in the car.  After surgery it may be brought to your room.  For patients admitted to the hospital, discharge time will be determined by your treatment team.  Patients discharged the day of surgery will not be allowed to drive home.   Name and phone number of your driver:   family Special instructions:  None  Please read over the following fact sheets that you were given. Anesthesia Post-op Instructions and Care and Recovery After Surgery       Lipoma Removal, Care After Refer to this sheet in the next few weeks. These instructions provide you with information about caring for yourself after your procedure. Your health care provider may also give you more specific instructions. Your treatment has been planned according to current medical practices, but problems sometimes occur. Call your health care provider if you have any problems or questions after your procedure. What can I expect after the procedure? After the procedure, it is common to have:  Mild pain.  Swelling.  Bruising.  Follow these instructions at home:  Bathing  Do not take baths, swim, or use a hot tub until your  health care provider approves. Ask your health care provider if you can take showers. You may only be allowed to take sponge baths for bathing.  Keep your bandage (dressing) dry until your health care provider says it can be removed. Incision care   Follow instructions from your health care provider about how to take care of your incision. Make sure you: ? Wash your hands with soap and water before you change your bandage (dressing). If soap and water are not available, use hand sanitizer. ? Change your dressing as told by your health care provider. ? Leave stitches (sutures), skin glue, or adhesive strips in place. These skin closures may need to stay in place for 2 weeks or longer. If adhesive strip edges start to loosen and curl up, you may trim the loose edges. Do not remove adhesive strips completely unless your health care provider tells you to do that.  Check your incision area every day for signs of infection. Check for: ? More redness, swelling, or pain. ? Fluid or blood. ? Warmth. ? Pus or a bad smell. Driving  Do not drive or operate heavy machinery while taking prescription pain medicine.  Do not drive for 24 hours if you received a medicine to help you relax (sedative) during your procedure.  Ask your health care provider  when it is safe for you to drive. General instructions  Take over-the-counter and prescription medicines only as told by your health care provider.  Do not use any tobacco products, such as cigarettes, chewing tobacco, and e-cigarettes. These can delay healing. If you need help quitting, ask your health care provider.  Return to your normal activities as told by your health care provider. Ask your health care provider what activities are safe for you.  Keep all follow-up visits as told by your health care provider. This is important. Contact a health care provider if:  You have more redness, swelling, or pain around your incision.  You have fluid or  blood coming from your incision.  Your incision feels warm to the touch.  You have pus or a bad smell coming from your incision.  You have pain that does not get better with medicine. Get help right away if:  You have chills or a fever.  You have severe pain. This information is not intended to replace advice given to you by your health care provider. Make sure you discuss any questions you have with your health care provider. Document Released: 10/27/2015 Document Revised: 01/24/2016 Document Reviewed: 10/27/2015 Elsevier Interactive Patient Education  2018 [place]. Lipoma A lipoma is a noncancerous (benign) tumor that is made up of fat cells. This is a very common type of soft-tissue growth. Lipomas are usually found under the skin (subcutaneous). They may occur in any tissue of the body that contains fat. Common areas for lipomas to appear include the back, shoulders, buttocks, and thighs. Lipomas grow slowly, and they are usually painless. Most lipomas do not cause problems and do not require treatment. What are the causes? The cause of this condition is not known. What increases the risk? This condition is more likely to develop in:  People who are 60-89 years old.  People who have a family history of lipomas.  What are the signs or symptoms? A lipoma usually appears as a small, round bump under the skin. It may feel soft or rubbery, but the firmness can vary. Most lipomas are not painful. However, a lipoma may become painful if it is located in an area where it pushes on nerves. How is this diagnosed? A lipoma can usually be diagnosed with a physical exam. You may also have tests to confirm the diagnosis and to rule out other conditions. Tests may include:  Imaging tests, such as a CT scan or MRI.  Removal of a tissue sample to be looked at under a microscope (biopsy).  How is this treated? Treatment is not needed for small lipomas that are not causing problems. If a  lipoma continues to get bigger or it causes problems, removal is often the best option. Lipomas can also be removed to improve appearance. Removal of a lipoma is usually done with a surgery in which the fatty cells and the surrounding capsule are removed. Most often, a medicine that numbs the area (local anesthetic) is used for this procedure. Follow these instructions at home:  Keep all follow-up visits as directed by your health care provider. This is important. Contact a health care provider if:  Your lipoma becomes larger or hard.  Your lipoma becomes painful, red, or increasingly swollen. These could be signs of infection or a more serious condition. This information is not intended to replace advice given to you by your health care provider. Make sure you discuss any questions you have with your health care provider. Document  Released: 08/03/2002 Document Revised: 01/19/2016 Document Reviewed: 08/09/2014 Elsevier Interactive Patient Education  2018 [place] Anesthesia is a term that refers to techniques, procedures, and medicines that help a person stay safe and comfortable during a medical procedure. Monitored anesthesia care, or sedation, is one type of anesthesia. Your anesthesia specialist may recommend sedation if you will be having a procedure that does not require you to be unconscious, such as:  Cataract surgery.  A dental procedure.  A biopsy.  A colonoscopy.  During the procedure, you may receive a medicine to help you relax (sedative). There are three levels of sedation:  Mild sedation. At this level, you may feel awake and relaxed. You will be able to follow directions.  Moderate sedation. At this level, you will be sleepy. You may not remember the procedure.  Deep sedation. At this level, you will be asleep. You will not remember the procedure.  The more medicine you are given, the deeper your level of sedation will be. Depending on  how you respond to the procedure, the anesthesia specialist may change your level of sedation or the type of anesthesia to fit your needs. An anesthesia specialist will monitor you closely during the procedure. Let your health care provider know about:  Any allergies you have.  All medicines you are taking, including vitamins, herbs, eye drops, creams, and over-the-counter medicines.  Any use of steroids (by mouth or as a cream).  Any problems you or family members have had with sedatives and anesthetic medicines.  Any blood disorders you have.  Any surgeries you have had.  Any medical conditions you have, such as sleep apnea.  Whether you are pregnant or may be pregnant.  Any use of cigarettes, alcohol, or street drugs. What are the risks? Generally, this is a safe procedure. However, problems may occur, including:  Getting too much medicine (oversedation).  Nausea.  Allergic reaction to medicines.  Trouble breathing. If this happens, a breathing tube may be used to help with breathing. It will be removed when you are awake and breathing on your own.  Heart trouble.  Lung trouble.  Before the procedure Staying hydrated Follow instructions from your health care provider about hydration, which may include:  Up to 2 hours before the procedure - you may continue to drink clear liquids, such as water, clear fruit juice, black coffee, and plain tea.  Eating and drinking restrictions Follow instructions from your health care provider about eating and drinking, which may include:  8 hours before the procedure - stop eating heavy meals or foods such as meat, fried foods, or fatty foods.  6 hours before the procedure - stop eating light meals or foods, such as toast or cereal.  6 hours before the procedure - stop drinking milk or drinks that contain milk.  2 hours before the procedure - stop drinking clear liquids.  Medicines Ask your health care provider  about:  Changing or stopping your regular medicines. This is especially important if you are taking diabetes medicines or blood thinners.  Taking medicines such as aspirin and ibuprofen. These medicines can thin your blood. Do not take these medicines before your procedure if your health care provider instructs you not to.  Tests and exams  You will have a physical exam.  You may have blood tests done to show: ? How well your kidneys and liver are working. ? How well your blood can clot.  General instructions  Plan to have someone  take you home from the hospital or clinic.  If you will be going home right after the procedure, plan to have someone with you for 24 hours.  What happens during the procedure?  Your blood pressure, heart rate, breathing, level of pain and overall condition will be monitored.  An IV tube will be inserted into one of your veins.  Your anesthesia specialist will give you medicines as needed to keep you comfortable during the procedure. This may mean changing the level of sedation.  The procedure will be performed. After the procedure  Your blood pressure, heart rate, breathing rate, and blood oxygen level will be monitored until the medicines you were given have worn off.  Do not drive for 24 hours if you received a sedative.  You may: ? Feel sleepy, clumsy, or nauseous. ? Feel forgetful about what happened after the procedure. ? Have a sore throat if you had a breathing tube during the procedure. ? Vomit. This information is not intended to replace advice given to you by your health care provider. Make sure you discuss any questions you have with your health care provider. Document Released: 05/09/2005 Document Revised: 01/20/2016 Document Reviewed: 12/04/2015 Elsevier Interactive Patient Education  2018 [place], Care After These instructions provide you with information about caring for yourself after your  procedure. Your health care provider may also give you more specific instructions. Your treatment has been planned according to current medical practices, but problems sometimes occur. Call your health care provider if you have any problems or questions after your procedure. What can I expect after the procedure? After your procedure, it is common to:  Feel sleepy for several hours.  Feel clumsy and have poor balance for several hours.  Feel forgetful about what happened after the procedure.  Have poor judgment for several hours.  Feel nauseous or vomit.  Have a sore throat if you had a breathing tube during the procedure.  Follow these instructions at home: For at least 24 hours after the procedure:   Do not: ? Participate in activities in which you could fall or become injured. ? Drive. ? Use heavy machinery. ? Drink alcohol. ? Take sleeping pills or medicines that cause drowsiness. ? Make important decisions or sign legal documents. ? Take care of children on your own.  Rest. Eating and drinking  Follow the diet that is recommended by your health care provider.  If you vomit, drink water, juice, or soup when you can drink without vomiting.  Make sure you have little or no nausea before eating solid foods. General instructions  Have a responsible adult stay with you until you are awake and alert.  Take over-the-counter and prescription medicines only as told by your health care provider.  If you smoke, do not smoke without supervision.  Keep all follow-up visits as told by your health care provider. This is important. Contact a health care provider if:  You keep feeling nauseous or you keep vomiting.  You feel light-headed.  You develop a rash.  You have a fever. Get help right away if:  You have trouble breathing. This information is not intended to replace advice given to you by your health care provider. Make sure you discuss any questions you have with  your health care provider. Document Released: 12/04/2015 Document Revised: 04/04/2016 Document Reviewed: 12/04/2015 Elsevier Interactive Patient Education  [employer].

## 2018-08-04 ENCOUNTER — Encounter (HOSPITAL_COMMUNITY)
Admission: RE | Admit: 2018-08-04 | Discharge: 2018-08-04 | Disposition: A | Discharge status: Home / Self Care | Payer: Medicare HMO | Source: Ambulatory Visit | Attending: General Surgery | Admitting: General Surgery

## 2018-08-04 ENCOUNTER — Encounter (HOSPITAL_COMMUNITY): Payer: Self-pay

## 2018-08-04 ENCOUNTER — Other Ambulatory Visit: Payer: Self-pay

## 2018-08-04 DIAGNOSIS — Z7984 Long term (current) use of oral hypoglycemic drugs: Secondary | ICD-10-CM | POA: Diagnosis not present

## 2018-08-04 DIAGNOSIS — Z79899 Other long term (current) drug therapy: Secondary | ICD-10-CM | POA: Insufficient documentation

## 2018-08-04 DIAGNOSIS — Z01818 Encounter for other preprocedural examination: Secondary | ICD-10-CM | POA: Insufficient documentation

## 2018-08-04 DIAGNOSIS — D171 Benign lipomatous neoplasm of skin and subcutaneous tissue of trunk: Secondary | ICD-10-CM | POA: Insufficient documentation

## 2018-08-04 DIAGNOSIS — E119 Type 2 diabetes mellitus without complications: Secondary | ICD-10-CM | POA: Diagnosis not present

## 2018-08-04 LAB — BASIC METABOLIC PANEL
Anion gap: 9 (ref 5–15)
BUN: 13 mg/dL (ref 8–23)
CO2: 25 mmol/L (ref 22–32)
Calcium: 8.8 mg/dL — ABNORMAL LOW (ref 8.9–10.3)
Chloride: 107 mmol/L (ref 98–111)
Creatinine, Ser: 1.07 mg/dL — ABNORMAL HIGH (ref 0.44–1.00)
GFR calc Af Amer: 60 mL/min — ABNORMAL LOW (ref >60)
GFR calc non Af Amer: 51 mL/min — ABNORMAL LOW (ref >60)
Glucose, Bld: 133 mg/dL — ABNORMAL HIGH (ref 70–99)
Potassium: 3.4 mmol/L — ABNORMAL LOW (ref 3.5–5.1)
Sodium: 141 mmol/L (ref 135–145)

## 2018-08-04 LAB — HEMOGLOBIN A1C
Hgb A1c MFr Bld: 6.1 % — ABNORMAL HIGH (ref 4.8–5.6)
Mean Plasma Glucose: 128.37 mg/dL

## 2018-08-04 LAB — GLUCOSE, CAPILLARY: Glucose-Capillary: 112 mg/dL — ABNORMAL HIGH (ref 70–99)

## 2018-08-08 ENCOUNTER — Encounter (HOSPITAL_COMMUNITY): Payer: Self-pay | Admitting: *Deleted

## 2018-08-08 ENCOUNTER — Ambulatory Visit (HOSPITAL_COMMUNITY): Payer: Medicare HMO | Admitting: Anesthesiology

## 2018-08-08 ENCOUNTER — Ambulatory Visit (HOSPITAL_COMMUNITY)
Admission: RE | Admit: 2018-08-08 | Discharge: 2018-08-08 | Disposition: A | Discharge status: Home / Self Care | Payer: Medicare HMO | Attending: General Surgery | Admitting: General Surgery

## 2018-08-08 ENCOUNTER — Encounter (HOSPITAL_COMMUNITY)
Admission: RE | Disposition: A | Discharge status: Home / Self Care | Payer: Self-pay | Source: Home / Self Care | Attending: General Surgery

## 2018-08-08 ENCOUNTER — Other Ambulatory Visit: Payer: Self-pay

## 2018-08-08 DIAGNOSIS — I10 Essential (primary) hypertension: Secondary | ICD-10-CM | POA: Diagnosis not present

## 2018-08-08 DIAGNOSIS — E119 Type 2 diabetes mellitus without complications: Secondary | ICD-10-CM | POA: Insufficient documentation

## 2018-08-08 DIAGNOSIS — Z79899 Other long term (current) drug therapy: Secondary | ICD-10-CM | POA: Diagnosis not present

## 2018-08-08 DIAGNOSIS — M109 Gout, unspecified: Secondary | ICD-10-CM | POA: Diagnosis not present

## 2018-08-08 DIAGNOSIS — K219 Gastro-esophageal reflux disease without esophagitis: Secondary | ICD-10-CM | POA: Insufficient documentation

## 2018-08-08 DIAGNOSIS — I2510 Atherosclerotic heart disease of native coronary artery without angina pectoris: Secondary | ICD-10-CM | POA: Insufficient documentation

## 2018-08-08 DIAGNOSIS — D171 Benign lipomatous neoplasm of skin and subcutaneous tissue of trunk: Secondary | ICD-10-CM | POA: Diagnosis not present

## 2018-08-08 DIAGNOSIS — I252 Old myocardial infarction: Secondary | ICD-10-CM | POA: Insufficient documentation

## 2018-08-08 DIAGNOSIS — Z951 Presence of aortocoronary bypass graft: Secondary | ICD-10-CM | POA: Diagnosis not present

## 2018-08-08 DIAGNOSIS — Z7982 Long term (current) use of aspirin: Secondary | ICD-10-CM | POA: Diagnosis not present

## 2018-08-08 DIAGNOSIS — E785 Hyperlipidemia, unspecified: Secondary | ICD-10-CM | POA: Diagnosis not present

## 2018-08-08 DIAGNOSIS — Z7984 Long term (current) use of oral hypoglycemic drugs: Secondary | ICD-10-CM | POA: Diagnosis not present

## 2018-08-08 HISTORY — PX: MASS EXCISION: SHX2000

## 2018-08-08 LAB — GLUCOSE, CAPILLARY: Glucose-Capillary: 125 mg/dL — ABNORMAL HIGH (ref 70–99)

## 2018-08-08 SURGERY — EXCISION MASS
Anesthesia: General | Site: Back | Laterality: Left

## 2018-08-08 MED ORDER — BUPIVACAINE HCL (PF) 0.5 % IJ SOLN
INTRAMUSCULAR | Status: DC | PRN
  Administered 2018-08-08: 10 mL

## 2018-08-08 MED ORDER — CEFAZOLIN SODIUM-DEXTROSE 2-4 GM/100ML-% IV SOLN
2.0000 g | INTRAVENOUS | Status: AC
  Administered 2018-08-08: 2 g via INTRAVENOUS

## 2018-08-08 MED ORDER — FENTANYL CITRATE (PF) 100 MCG/2ML IJ SOLN
INTRAMUSCULAR | Status: DC | PRN
  Administered 2018-08-08 (×2): 50 ug via INTRAVENOUS

## 2018-08-08 MED ORDER — DOCUSATE SODIUM 100 MG PO CAPS: 100.0000 mg | ORAL_CAPSULE | Freq: Two times a day (BID) | ORAL | 2 refills | Status: DC

## 2018-08-08 MED ORDER — PROPOFOL 10 MG/ML IV BOLUS
INTRAVENOUS | Status: AC
  Filled 2018-08-08: qty 40

## 2018-08-08 MED ORDER — BUPIVACAINE HCL (PF) 0.5 % IJ SOLN
INTRAMUSCULAR | Status: AC
  Filled 2018-08-08: qty 30

## 2018-08-08 MED ORDER — LACTATED RINGERS IV SOLN: INTRAVENOUS | Status: DC

## 2018-08-08 MED ORDER — MIDAZOLAM HCL 5 MG/5ML IJ SOLN
INTRAMUSCULAR | Status: DC | PRN
  Administered 2018-08-08 (×2): 1 mg via INTRAVENOUS

## 2018-08-08 MED ORDER — CHLORHEXIDINE GLUCONATE CLOTH 2 % EX PADS: 6.0000 | MEDICATED_PAD | Freq: Once | CUTANEOUS | Status: DC

## 2018-08-08 MED ORDER — EPHEDRINE SULFATE 50 MG/ML IJ SOLN
INTRAMUSCULAR | Status: DC | PRN
  Administered 2018-08-08: 10 mg via INTRAVENOUS

## 2018-08-08 MED ORDER — CEFAZOLIN SODIUM-DEXTROSE 2-4 GM/100ML-% IV SOLN
INTRAVENOUS | Status: AC
  Filled 2018-08-08: qty 100

## 2018-08-08 MED ORDER — 0.9 % SODIUM CHLORIDE (POUR BTL) OPTIME
TOPICAL | Status: DC | PRN
  Administered 2018-08-08: 1000 mL

## 2018-08-08 MED ORDER — OXYCODONE HCL 5 MG PO TABS: 5.0000 mg | ORAL_TABLET | ORAL | 0 refills | Status: DC | PRN

## 2018-08-08 MED ORDER — HYDROCODONE-ACETAMINOPHEN 7.5-325 MG PO TABS
ORAL_TABLET | ORAL | Status: AC
  Filled 2018-08-08: qty 1

## 2018-08-08 MED ORDER — LACTATED RINGERS IV SOLN
INTRAVENOUS | Status: DC | PRN
  Administered 2018-08-08: 08:00:00 via INTRAVENOUS

## 2018-08-08 MED ORDER — PROMETHAZINE HCL 25 MG/ML IJ SOLN: 6.2500 mg | INTRAMUSCULAR | Status: DC | PRN

## 2018-08-08 MED ORDER — EPHEDRINE 5 MG/ML INJ
INTRAVENOUS | Status: AC
  Filled 2018-08-08: qty 10

## 2018-08-08 MED ORDER — MIDAZOLAM HCL 2 MG/2ML IJ SOLN
INTRAMUSCULAR | Status: AC
  Filled 2018-08-08: qty 2

## 2018-08-08 MED ORDER — LIDOCAINE HCL 1 % IJ SOLN
INTRAMUSCULAR | Status: DC | PRN
  Administered 2018-08-08: 50 mg via INTRADERMAL

## 2018-08-08 MED ORDER — HYDROMORPHONE HCL 1 MG/ML IJ SOLN: 0.2500 mg | INTRAMUSCULAR | Status: DC | PRN

## 2018-08-08 MED ORDER — PROPOFOL 10 MG/ML IV BOLUS
INTRAVENOUS | Status: DC | PRN
  Administered 2018-08-08: 20 mg via INTRAVENOUS
  Administered 2018-08-08: 150 mg via INTRAVENOUS

## 2018-08-08 MED ORDER — HYDROCODONE-ACETAMINOPHEN 7.5-325 MG PO TABS
1.0000 | ORAL_TABLET | Freq: Once | ORAL | Status: AC | PRN
  Administered 2018-08-08: 1 via ORAL

## 2018-08-08 MED ORDER — LIDOCAINE 2% (20 MG/ML) 5 ML SYRINGE
INTRAMUSCULAR | Status: AC
  Filled 2018-08-08: qty 5

## 2018-08-08 MED ORDER — FENTANYL CITRATE (PF) 100 MCG/2ML IJ SOLN
INTRAMUSCULAR | Status: AC
  Filled 2018-08-08: qty 2

## 2018-08-08 MED ORDER — MIDAZOLAM HCL 2 MG/2ML IJ SOLN: 0.5000 mg | Freq: Once | INTRAMUSCULAR | Status: DC | PRN

## 2018-08-08 SURGICAL SUPPLY — 34 items
ADH SKN CLS APL DERMABOND .7 (GAUZE/BANDAGES/DRESSINGS) ×2
CHLORAPREP W/TINT 10.5 ML (MISCELLANEOUS) ×2 IMPLANT
CLOTH BEACON ORANGE TIMEOUT ST (SAFETY) ×2 IMPLANT
COVER LIGHT HANDLE STERIS (MISCELLANEOUS) ×4 IMPLANT
COVER WAND RF STERILE (DRAPES) ×1 IMPLANT
DECANTER SPIKE VIAL GLASS SM (MISCELLANEOUS) ×2 IMPLANT
DERMABOND ADVANCED (GAUZE/BANDAGES/DRESSINGS) ×2
DERMABOND ADVANCED .7 DNX12 (GAUZE/BANDAGES/DRESSINGS) IMPLANT
ELECT NDL TIP 2.8 STRL (NEEDLE) IMPLANT
ELECT NEEDLE TIP 2.8 STRL (NEEDLE) ×2 IMPLANT
ELECT REM PT RETURN 9FT ADLT (ELECTROSURGICAL) ×2
ELECTRODE REM PT RTRN 9FT ADLT (ELECTROSURGICAL) ×1 IMPLANT
GLOVE BIO SURGEON STRL SZ 6.5 (GLOVE) ×2 IMPLANT
GLOVE BIOGEL PI IND STRL 6.5 (GLOVE) ×1 IMPLANT
GLOVE BIOGEL PI IND STRL 7.0 (GLOVE) ×1 IMPLANT
GLOVE BIOGEL PI INDICATOR 6.5 (GLOVE) ×1
GLOVE BIOGEL PI INDICATOR 7.0 (GLOVE) ×2
GLOVE ECLIPSE 6.5 STRL STRAW (GLOVE) ×1 IMPLANT
GOWN STRL REUS W/ TWL XL LVL3 (GOWN DISPOSABLE) ×1 IMPLANT
GOWN STRL REUS W/TWL LRG LVL3 (GOWN DISPOSABLE) ×2 IMPLANT
GOWN STRL REUS W/TWL XL LVL3 (GOWN DISPOSABLE) ×2
KIT TURNOVER KIT A (KITS) ×2 IMPLANT
MANIFOLD NEPTUNE II (INSTRUMENTS) ×2 IMPLANT
NDL HYPO 25X1 1.5 SAFETY (NEEDLE) ×1 IMPLANT
NEEDLE HYPO 25X1 1.5 SAFETY (NEEDLE) ×2 IMPLANT
NS IRRIG 1000ML POUR BTL (IV SOLUTION) ×2 IMPLANT
PACK MINOR (CUSTOM PROCEDURE TRAY) ×1 IMPLANT
PAD ARMBOARD 7.5X6 YLW CONV (MISCELLANEOUS) ×2 IMPLANT
SET BASIN LINEN APH (SET/KITS/TRAYS/PACK) ×2 IMPLANT
SUT MNCRL AB 4-0 PS2 18 (SUTURE) ×2 IMPLANT
SUT PROLENE 4 0 PS 2 18 (SUTURE) IMPLANT
SUT VIC AB 3-0 SH 27 (SUTURE) ×2
SUT VIC AB 3-0 SH 27X BRD (SUTURE) IMPLANT
SYR CONTROL 10ML LL (SYRINGE) ×2 IMPLANT

## 2018-08-08 NOTE — Discharge Instructions (Signed)
Discharge Open Abdominal Surgery Instructions:  Common Complaints: Pain at the incision site is common. This will improve with time. Take your pain medications as described below.  Diet/ Activity: Diet as tolerated. Shower per your regular routine daily.  Do not take hot showers. Take warm showers that are less than 10 minutes. Path the incision dry. Rest and listen to your body.  Do not lift > 10 lbs, perform excessive bending, stretching, pushing, pulling until seen by Dr. Constance Haw.  Do not place lotions or balms on your incision unless instructed to specifically by Dr. Constance Haw.     General Anesthesia, Adult, Care After These instructions provide you with information about caring for yourself after your procedure. Your health care provider may also give you more specific instructions. Your treatment has been planned according to current medical practices, but problems sometimes occur. Call your health care provider if you have any problems or questions after your procedure. What can I expect after the procedure? After the procedure, it is common to have:  Vomiting.  A sore throat.  Mental slowness.  It is common to feel:  Nauseous.  Cold or shivery.  Sleepy.  Tired.  Sore or achy, even in parts of your body where you did not have surgery.  Follow these instructions at home: For at least 24 hours after the procedure:  Do not: ? Participate in activities where you could fall or become injured. ? Drive. ? Use heavy machinery. ? Drink alcohol. ? Take sleeping pills or medicines that cause drowsiness. ? Make important decisions or sign legal documents. ? Take care of children on your own.  Rest. Eating and drinking  If you vomit, drink water, juice, or soup when you can drink without vomiting.  Drink enough fluid to keep your urine clear or pale yellow.  Make sure you have little or no nausea before eating solid foods.  Follow the diet recommended by your health  care provider. General instructions  Have a responsible adult stay with you until you are awake and alert.  Return to your normal activities as told by your health care provider. Ask your health care provider what activities are safe for you.  Take over-the-counter and prescription medicines only as told by your health care provider.  If you smoke, do not smoke without supervision.  Keep all follow-up visits as told by your health care provider. This is important. Contact a health care provider if:  You continue to have nausea or vomiting at home, and medicines are not helpful.  You cannot drink fluids or start eating again.  You cannot urinate after 8-12 hours.  You develop a skin rash.  You have fever.  You have increasing redness at the site of your procedure. Get help right away if:  You have difficulty breathing.  You have chest pain.  You have unexpected bleeding.  You feel that you are having a life-threatening or urgent problem. This information is not intended to replace advice given to you by your health care provider. Make sure you discuss any questions you have with your health care provider. Document Released: 11/19/2000 Document Revised: 01/16/2016 Document Reviewed: 07/28/2015 Elsevier Interactive Patient Education  2018 [employer].   Medication: Take tylenol and ibuprofen as needed for pain control, alternating every 4-6 hours.  Example:  Tylenol 1000mg  @ 6am, 12noon, 6pm, 20midnight (Do not exceed 4000mg  of tylenol a day). Ibuprofen 800mg  @ 9am, 3pm, 9pm, 3am (Do not exceed 3600mg  of ibuprofen a day).  Take  Roxicodone for breakthrough pain every 4 hours.  Take Colace for constipation related to narcotic pain medication. If you do not have a bowel movement in 2 days, take Miralax over the counter.  Drink plenty of water to also prevent constipation.   Contact Information: If you have questions or concerns, please call our office, [phone],  Monday- Thursday 8AM-5PM and Friday 8AM-12Noon.  If it is after hours or on the weekend, please call [facility] Main Number, [phone], and ask to speak to the surgeon on call for Dr. Constance Haw at [facility].   Lipoma Removal, Care After Refer to this sheet in the next few weeks. These instructions provide you with information about caring for yourself after your procedure. Your health care provider may also give you more specific instructions. Your treatment has been planned according to current medical practices, but problems sometimes occur. Call your health care provider if you have any problems or questions after your procedure. What can I expect after the procedure? After the procedure, it is common to have:  Mild pain.  Swelling.  Bruising.  Follow these instructions at home:  Bathing  Do not take baths, swim, or use a hot tub until your health care provider approves.   You may Shower.  Incision care   Follow instructions from your health care provider about how to take care of your incision. Make sure you: ? Wash your hands with soap and water before you change your bandage (dressing). If soap and water are not available, use hand sanitizer. ? Change your dressing as told by your health care provider. ? Leave stitches (sutures), skin glue, or adhesive strips in place. These skin closures may need to stay in place for 2 weeks or longer. If adhesive strip edges start to loosen and curl up, you may trim the loose edges. Do not remove adhesive strips completely unless your health care provider tells you to do that.  Check your incision area every day for signs of infection. Check for: ? More redness, swelling, or pain. ? Fluid or blood. ? Warmth. ? Pus or a bad smell. Driving  Do not drive or operate heavy machinery while taking prescription pain medicine.  Do not drive for 24 hours if you received a medicine to help you relax (sedative) during your procedure.  Ask your  health care provider when it is safe for you to drive. General instructions  Take over-the-counter and prescription medicines only as told by your health care provider.  Do not use any tobacco products, such as cigarettes, chewing tobacco, and e-cigarettes. These can delay healing. If you need help quitting, ask your health care provider.  Return to your normal activities as told by your health care provider. Ask your health care provider what activities are safe for you.  Keep all follow-up visits as told by your health care provider. This is important. Contact a health care provider if:  You have more redness, swelling, or pain around your incision.  You have fluid or blood coming from your incision.  Your incision feels warm to the touch.  You have pus or a bad smell coming from your incision.  You have pain that does not get better with medicine. Get help right away if:  You have chills or a fever.  You have severe pain. This information is not intended to replace advice given to you by your health care provider. Make sure you discuss any questions you have with your health care provider. Document Released: 10/27/2015  Document Revised: 01/24/2016 Document Reviewed: 10/27/2015 Elsevier Interactive Patient Education  [employer].

## 2018-08-08 NOTE — Op Note (Signed)
[facility] Operative Note  08/08/18  Preoperative Diagnosis:  Left lower back lipoma    Postoperative Diagnosis: Same   Procedure(s) Performed:  Excision of lipoma 10cm    Surgeon: Ria Comment C. Constance Haw, MD   Assistants: No qualified resident was available    Anesthesia: General endotracheal   Anesthesiologist:  Dr. Hilaria Ota    Specimens: Lipoma    Estimated Blood Loss: Minimal   Blood Replacement: None    Complications: None   Wound Class: Clean    Operative Indications: [name] is a 72 yo with a history of a lower left back lipoma she noted in the last few months after losing some weight. This area is worrisome to her but does not cause much discomfort. Due to the size, we opted to remove the lipoma surgically. We discussed the risk and benefits of surgery including but not limited to bleeding, infection, risk of recurrence.   Findings: Large lipoma 10cm X 9cm    Procedure: The patient was taken to the operating room and placed supine. General endotracheal anesthesia was induced. Intravenous antibiotics were administered per protocol.  The patient was then turned to the right lateral decubitus position and the left lower back was prepped and draped in the normal sterile fashion.    The area was palpated and an incision was made along the dermatomal lines of the back.  The incision was carried down through the subcutaneous tissue with electrocautery.  The lipoma was grasped with a an Alis and eviscerated from the cavity. Electrocautery was used to excise it in its entirety.  The cavity was hemostatic. The lipoma was superficial to the muscle.  The deep layer was closed with a 3-0 Vicryl and the skin was closed with 4-0 Monocryl and dermabond.   All counts were correct at the end of the case. The patient was awakened from anesthesia and extubate without complication.  The patient went to the PACU in stable condition.   [name], MD [facility] [address] [place], [place] [id number] [phone] (office)

## 2018-08-08 NOTE — Anesthesia Procedure Notes (Signed)
Procedure Name: LMA Insertion Date/Time: 08/08/2018 8:49 AM Performed by: Charmaine Downs, CRNA Pre-anesthesia Checklist: Patient identified, Patient being monitored, Emergency Drugs available, Timeout performed and Suction available Patient Re-evaluated:Patient Re-evaluated prior to induction Oxygen Delivery Method: Circle System Utilized Preoxygenation: Pre-oxygenation with 100% oxygen Induction Type: IV induction Ventilation: Mask ventilation without difficulty LMA: LMA inserted LMA Size: 4.0 Number of attempts: 1 Placement Confirmation: positive ETCO2 and breath sounds checked- equal and bilateral Tube secured with: Tape Dental Injury: Teeth and Oropharynx as per pre-operative assessment

## 2018-08-08 NOTE — Anesthesia Preprocedure Evaluation (Signed)
Anesthesia Evaluation  Patient identified by MRN, date of birth, ID band Patient awake    Reviewed: Allergy & Precautions, NPO status , Patient's Chart, lab work & pertinent test results, reviewed documented beta blocker date and time   Airway Mallampati: I  TM Distance: >3 FB Neck ROM: Full    Dental no notable dental hx.    Pulmonary neg pulmonary ROS,    Pulmonary exam normal breath sounds clear to auscultation       Cardiovascular Exercise Tolerance: Poor hypertension, Pt. on medications + CAD and + Past MI  Normal cardiovascular exam+ dysrhythmias II Rhythm:Regular Rate:Normal  Reports MI 2008, CABG 2013  States No SL NTG use in 8 months  States limited ET because afraid to fall  Chart has AS listed -I cant find any studies which show this    Neuro/Psych negative neurological ROS  negative psych ROS   GI/Hepatic Neg liver ROS, GERD  Medicated and Controlled,  Endo/Other  negative endocrine ROSdiabetes, Well Controlled, Type 2, Oral Hypoglycemic Agents  Renal/GU negative Renal ROS  negative genitourinary   Musculoskeletal  (+) Arthritis , Osteoarthritis,    Abdominal (+) + obese,   Peds negative pediatric ROS (+)  Hematology negative hematology ROS (+)   Anesthesia Other Findings   Reproductive/Obstetrics negative OB ROS                             Anesthesia Physical Anesthesia Plan  ASA: III  Anesthesia Plan: General   Post-op Pain Management:    Induction: Intravenous  PONV Risk Score and Plan:   Airway Management Planned: LMA and Oral ETT  Additional Equipment:   Intra-op Plan:   Post-operative Plan: Extubation in OR  Informed Consent: I have reviewed the patients History and Physical, chart, labs and discussed the procedure including the risks, benefits and alternatives for the proposed anesthesia with the patient or authorized representative who has indicated  his/her understanding and acceptance.   Dental advisory given  Plan Discussed with: CRNA  Anesthesia Plan Comments: (LMA vs ETT as needed -WTP with GA(LMA) vs GETA)        Anesthesia Quick Evaluation

## 2018-08-08 NOTE — Interval H&P Note (Signed)
History and Physical Interval Note:  08/08/2018 7:33 AM  [name]  has presented today for surgery, with the diagnosis of 5cm lipoma on back  The various methods of treatment have been discussed with the patient and family. After consideration of risks, benefits and other options for treatment, the patient has consented to  Procedure(s): EXCISION 5CM LIPOMA ON BACK (N/A) as a surgical intervention .  The patient's history has been reviewed, patient examined, no change in status, stable for surgery.  I have reviewed the patient's chart and labs.  Questions were answered to the patient's satisfaction.    No major changes. No issues. Marked.  Virl Cagey

## 2018-08-08 NOTE — Transfer of Care (Signed)
Immediate Anesthesia Transfer of Care Note  Patient: [name]  Procedure(s) Performed: EXCISION 5CM LIPOMA ON BACK (Left Back)  Patient Location: PACU  Anesthesia Type:General  Level of Consciousness: awake and patient cooperative  Airway & Oxygen Therapy: Patient Spontanous Breathing  Post-op Assessment: Report given to RN and Post -op Vital signs reviewed and stable  Post vital signs: Reviewed and stable  Last Vitals:  Vitals Value Taken Time  BP    Temp    Pulse 74 08/08/2018  9:45 AM  Resp    SpO2 98 % 08/08/2018  9:45 AM  Vitals shown include unvalidated device data.  Last Pain:  Vitals:   08/08/18 0731  TempSrc: Oral  PainSc: 0-No pain      Patients Stated Pain Goal: 5 (61/96/94 0982)  Complications: No apparent anesthesia complications

## 2018-08-11 ENCOUNTER — Encounter (HOSPITAL_COMMUNITY): Payer: Self-pay | Admitting: General Surgery

## 2018-08-11 NOTE — Anesthesia Postprocedure Evaluation (Signed)
Anesthesia Post Note  Patient: [name]  Procedure(s) Performed: EXCISION 5CM LIPOMA ON BACK (Left Back)  Patient location during evaluation: PACU Anesthesia Type: General Level of consciousness: awake, awake and alert and oriented Pain management: pain level controlled Vital Signs Assessment: post-procedure vital signs reviewed and stable Respiratory status: spontaneous breathing, nonlabored ventilation and respiratory function stable Cardiovascular status: blood pressure returned to baseline Postop Assessment: no apparent nausea or vomiting and adequate PO intake Anesthetic complications: no     Last Vitals:  Vitals:   08/08/18 0945 08/08/18 1000  BP: 123/63 (!) 137/50  Pulse: 75 66  Resp: 16 15  Temp: 37 C   SpO2: 96% 96%    Last Pain:  Vitals:   08/08/18 1000  TempSrc:   PainSc: 0-No pain                 Osiel Stick J

## 2018-08-19 ENCOUNTER — Ambulatory Visit (INDEPENDENT_AMBULATORY_CARE_PROVIDER_SITE_OTHER): Payer: Self-pay | Admitting: General Surgery

## 2018-08-19 ENCOUNTER — Encounter: Payer: Self-pay | Admitting: General Surgery

## 2018-08-19 ENCOUNTER — Ambulatory Visit: Payer: Self-pay | Admitting: General Surgery

## 2018-08-19 VITALS — BP 153/73 | HR 71 | Temp 97.1°F | Resp 18 | Wt 211.0 lb

## 2018-08-19 DIAGNOSIS — D171 Benign lipomatous neoplasm of skin and subcutaneous tissue of trunk: Secondary | ICD-10-CM

## 2018-08-19 NOTE — Patient Instructions (Signed)
Activity as tolerated

## 2018-08-19 NOTE — Progress Notes (Signed)
[facility] Note   HPI:  72 y.o. Female presents to clinic for post-op follow-up evaluation after lipoma excision. Patient reports  She is doing well.  Review of Systems:  No pain No fevers or chills All other review of systems: otherwise negative   Pathology: Diagnosis Soft tissue, lipoma, back - MATURE ADIPOSE TISSUE CONSISTENT WITH LIPOMA  Vital Signs:  BP (!) 153/73 (BP Location: Left Arm, Patient Position: Sitting, Cuff Size: Large)   Pulse 71   Temp (!) 97.1 F (36.2 C) (Temporal)   Resp 18   Wt 211 lb (95.7 kg)   BMI 38.59 kg/m    Physical Exam:  Physical Exam Vitals signs reviewed.  Constitutional:      Appearance: Normal appearance.  Cardiovascular:     Rate and Rhythm: Normal rate.  Pulmonary:     Effort: Pulmonary effort is normal.  Chest:     Comments: Back incision on lower left, healing, no erythema or drainage, some minor induration Neurological:     Mental Status: She is alert.      Assessment:  72 y.o. yo Female s/p lipoma excision. Doing well.  Plan:  - Induration will continue to improve  - Activity as tolerated  - Follow up PRN   All of the above recommendations were discussed with the patient and patient's family, and all of patient's and family's questions were answered to their expressed satisfaction.  [name], MD [facility] [address] [place], [place] [address] [phone] (office)

## 2018-09-22 DIAGNOSIS — Z1231 Encounter for screening mammogram for malignant neoplasm of breast: Secondary | ICD-10-CM | POA: Diagnosis not present

## 2018-10-02 DIAGNOSIS — G8929 Other chronic pain: Secondary | ICD-10-CM | POA: Diagnosis not present

## 2018-10-02 DIAGNOSIS — M1990 Unspecified osteoarthritis, unspecified site: Secondary | ICD-10-CM | POA: Diagnosis not present

## 2018-10-02 DIAGNOSIS — I252 Old myocardial infarction: Secondary | ICD-10-CM | POA: Diagnosis not present

## 2018-10-02 DIAGNOSIS — I25119 Atherosclerotic heart disease of native coronary artery with unspecified angina pectoris: Secondary | ICD-10-CM | POA: Diagnosis not present

## 2018-10-02 DIAGNOSIS — M109 Gout, unspecified: Secondary | ICD-10-CM | POA: Diagnosis not present

## 2018-10-02 DIAGNOSIS — I10 Essential (primary) hypertension: Secondary | ICD-10-CM | POA: Diagnosis not present

## 2018-10-02 DIAGNOSIS — G3184 Mild cognitive impairment, so stated: Secondary | ICD-10-CM | POA: Diagnosis not present

## 2018-10-02 DIAGNOSIS — R269 Unspecified abnormalities of gait and mobility: Secondary | ICD-10-CM | POA: Diagnosis not present

## 2018-10-02 DIAGNOSIS — E1159 Type 2 diabetes mellitus with other circulatory complications: Secondary | ICD-10-CM | POA: Diagnosis not present

## 2018-10-24 ENCOUNTER — Encounter: Payer: Self-pay | Admitting: *Deleted

## 2018-10-24 ENCOUNTER — Ambulatory Visit (INDEPENDENT_AMBULATORY_CARE_PROVIDER_SITE_OTHER): Payer: Medicare HMO | Admitting: Cardiology

## 2018-10-24 ENCOUNTER — Encounter: Payer: Self-pay | Admitting: Cardiology

## 2018-10-24 VITALS — BP 145/79 | HR 61 | Ht 62.0 in | Wt 207.0 lb

## 2018-10-24 DIAGNOSIS — E782 Mixed hyperlipidemia: Secondary | ICD-10-CM

## 2018-10-24 DIAGNOSIS — I2510 Atherosclerotic heart disease of native coronary artery without angina pectoris: Secondary | ICD-10-CM | POA: Diagnosis not present

## 2018-10-24 DIAGNOSIS — I10 Essential (primary) hypertension: Secondary | ICD-10-CM

## 2018-10-24 MED ORDER — CHLORTHALIDONE 25 MG PO TABS: 12.5000 mg | ORAL_TABLET | Freq: Every day | ORAL | 3 refills | Status: DC

## 2018-10-24 NOTE — Patient Instructions (Signed)
Medication Instructions:  START CHLORTHALIDONE 12.5 MG DAILY (1/2 TABLET)  Labwork: 1 WEEK  BMET MAGNESIUM   Testing/Procedures: NONE  Follow-Up: Your physician wants you to follow-up in: 1 YEAR. You will receive a reminder letter in the mail two months in advance. If you don't receive a letter, please call our office to schedule the follow-up appointment.   Any Other Special Instructions Will Be Listed Below (If Applicable).     If you need a refill on your cardiac medications before your next appointment, please call your pharmacy.

## 2018-10-24 NOTE — Addendum Note (Signed)
Addended by: Debbora Lacrosse R on: 10/24/2018 12:47 PM   Modules accepted: Orders

## 2018-10-24 NOTE — Progress Notes (Signed)
Clinical Summary [name] is a 73 y.o.female seen today for follow up of the following medical problems.    1. CAD  - CABG Jan 2013: 3 vessel (LIMA to LAD, SVG to OM, SVG to acute marginal).She had prior stenting as described below  - Jan 2013 echo LVEF 55-60%    - denies any chest pain. No SOB/DOE - compliant with meds  2. Hyperlipidemia  - on pravastatin, compliant  - she reports she was on lipitor in the past, however it was changed for unclear reasons.  - compliant with statin   3. HTN  - from review of last several provider visits bp's have been elevated Compliant with meds    4. PAD - followed by vascular.  Past Medical History:  Diagnosis Date  . Aortic stenosis   . Arthritis   . AV block, 1st degree   . Back pain    occasionally with weather changes  . CAD (coronary artery disease)    Non-STEMI/[facility]us stenting 100% left anterior descending (2), January 2008. Residual 70% circumflex;80% right coronary artery. Mild LVD (EF 45%);improved to 55-60% by 2-D echocardiogram,January 2009.   . DM (diabetes mellitus) ([place])    takes Metformin 1000mg  BID  . Dyslipidemia    takes Pravastatin daily  . Early cataracts, bilateral   . Gout    takes Allopurinol daily  . Herniated disc    left  . Lumbar herniated disc   . Myocardial infarction [facility]) 2008   pt has 2 stents  . Nocturia   . Obesity   . Unspecified essential hypertension    takes Metoprolol,Lisinopril,and Amlodipine  . Urinary frequency      No Known Allergies   Current Outpatient Medications  Medication Sig Dispense Refill  . acetaminophen (TYLENOL) 500 MG tablet Take 500 mg by mouth 2 (two) times daily as needed for moderate pain or headache.     . allopurinol (ZYLOPRIM) 100 MG tablet Take 100 mg by mouth at bedtime.     [name] amLODipine (NORVASC) 10 MG tablet TAKE 1 TABLET EVERY DAY  *DOSE  INCREASE* (Patient taking differently: Take 10 mg by mouth daily. ) 90 tablet 3  . aspirin 81  MG tablet Take 81 mg by mouth at bedtime.     . Cholecalciferol (VITAMIN D) 50 MCG (2000 UT) tablet Take 2,000 Units by mouth daily.    . colchicine 0.6 MG tablet Take one tablet PO and may repeat in one hour if no improvement. Then take one tablet every 3 hours until pain is relieved. (Patient taking differently: Take 0.6 mg by mouth daily as needed (gout flare). ) 30 tablet 0  . diclofenac sodium (VOLTAREN) 1 % GEL Apply 4 g topically 4 (four) times daily as needed (leg pain).   1  . lisinopril (PRINIVIL,ZESTRIL) 40 MG tablet Take 1 tablet (40 mg total) by mouth daily. 90 tablet 3  . metFORMIN (GLUCOPHAGE) 500 MG tablet Take 500 mg by mouth 2 (two) times daily with a meal.     . metoprolol tartrate (LOPRESSOR) 25 MG tablet TAKE 1 TABLET BY MOUTH TWICE A DAY 180 tablet 1  . nitroGLYCERIN (NITROSTAT) 0.4 MG SL tablet PLACE 1 TABLET  UNDER THE TONGUE EVERY 5 MINUTES AS NEEDED FOR CHEST PAIN. (Patient taking differently: Place 0.4 mg under the tongue every 5 (five) minutes as needed for chest pain. ) 25 tablet 3  . Omega-3 Fatty Acids (OMEGA-3 FISH OIL) 300 MG CAPS Take 300 mg by  mouth daily.    . pravastatin (PRAVACHOL) 40 MG tablet Take 40 mg by mouth at bedtime.     [name] tiZANidine (ZANAFLEX) 4 MG tablet Take 4 mg by mouth at bedtime as needed for muscle spasms.  0  . vitamin B-12 (CYANOCOBALAMIN) 500 MCG tablet Take 500 mcg by mouth daily.     No current facility-administered medications for this visit.      Past Surgical History:  Procedure Laterality Date  . ABDOMINAL HYSTERECTOMY    . BACK SURGERY  2009/2010/2011  . BREAST BIOPSY  unknown   left  . CARDIAC CATHETERIZATION  2008   NON-STEMI/TAXUS STENTING 100% PROXIMAL LAD (X2) JANUARY 2008  . CARDIAC CATHETERIZATION  2012   occluded LAD stents, Om1 : 70%, RCA: 80%, Ef 40-45%  . CARPAL TUNNEL RELEASE     bilateral  . CATARACT EXTRACTION W/PHACO Right 10/11/2017   Procedure: CATARACT EXTRACTION PHACO AND INTRAOCULAR LENS PLACEMENT RIGHT  EYE;  Surgeon: Baruch Goldmann, MD;  Location: [facility];  Service: Ophthalmology;  Laterality: Right;  CDE: 5.17  . CATARACT EXTRACTION W/PHACO Left 12/27/2017   Procedure: CATARACT EXTRACTION PHACO AND INTRAOCULAR LENS PLACEMENT (IOC);  Surgeon: Baruch Goldmann, MD;  Location: [facility];  Service: Ophthalmology;  Laterality: Left;  CDE: 2.99  . CHOLECYSTECTOMY  not sure  . COLONOSCOPY    . CORONARY ARTERY BYPASS GRAFT  09/05/2011   Procedure: CORONARY ARTERY BYPASS GRAFTING (CABG);  Surgeon: Grace Isaac, MD;  Location: [facility];  Service: Open Heart Surgery;  Laterality: N/A;  CABG times three using left internal mammary artery and left leg greater saphenous vein harvested endoscopically  . LEG TENDON SURGERY  unknown   left   . LUMBAR LAMINECTOMY/DECOMPRESSION MICRODISCECTOMY Left 06/04/2014   Procedure: LUMBAR LAMINECTOMY/DECOMPRESSION MICRODISCECTOMY LUMBAR FOUR-FIVE;  Surgeon: Elaina Hoops, MD;  Location: [facility];  Service: Neurosurgery;  Laterality: Left;  [name] MASS EXCISION Left 08/08/2018   Procedure: EXCISION 5CM LIPOMA ON BACK;  Surgeon: Virl Cagey, MD;  Location: [facility];  Service: General;  Laterality: Left;  . TONSILLECTOMY       No Known Allergies    Family History  Problem Relation Age of Onset  . Heart attack Sister 32  . Heart attack Brother 63  . Anesthesia problems Neg Hx   . Hypotension Neg Hx   . Malignant hyperthermia Neg Hx   . Pseudochol deficiency Neg Hx      Social History [name] reports that she has never smoked. She has never used smokeless tobacco. [name] reports no history of alcohol use.   Review of Systems CONSTITUTIONAL: No weight loss, fever, chills, weakness or fatigue.  HEENT: Eyes: No visual loss, blurred vision, double vision or yellow sclerae.No hearing loss, sneezing, congestion, runny nose or sore throat.  SKIN: No rash or itching.  CARDIOVASCULAR: per hpi RESPIRATORY: No shortness of breath, cough or sputum.    GASTROINTESTINAL: No anorexia, nausea, vomiting or diarrhea. No abdominal pain or blood.  GENITOURINARY: No burning on urination, no polyuria NEUROLOGICAL: No headache, dizziness, syncope, paralysis, ataxia, numbness or tingling in the extremities. No change in bowel or bladder control.  MUSCULOSKELETAL: No muscle, back pain, joint pain or stiffness.  LYMPHATICS: No enlarged nodes. No history of splenectomy.  PSYCHIATRIC: No history of depression or anxiety.  ENDOCRINOLOGIC: No reports of sweating, cold or heat intolerance. No polyuria or polydipsia.  [name]   Physical Examination Vitals:   10/24/18 1105  BP: (!) 145/79  Pulse: 61  SpO2: 99%   Vitals:   10/24/18 1105  Weight: 207 lb (93.9 kg)  Height: 5\' 2"  (1.575 m)    Gen: resting comfortably, no acute distress HEENT: no scleral icterus, pupils equal round and reactive, no palptable cervical adenopathy,  CV: RRR, no m/r/g, no jvd Resp: Clear to auscultation bilaterally GI: abdomen is soft, non-tender, non-distended, normal bowel sounds, no hepatosplenomegaly MSK: extremities are warm, no edema.  Skin: warm, no rash Neuro:  no focal deficits Psych: appropriate affect   Diagnostic Studies Jan 2013 LVEF 03-21%, diastolic dysfunction,     Assessment and Plan  1. CAD  - no recent symptoms, continue current meds - EKG shows SR, no acute ischemic changes  2. HTN  - above goal. We will start chlorthalidone 12.5mg  daily, check BMET/Mg in 1 week  3. Hyperlipidemia  - request labs from pcp, continue statin  F/u 1 year    Arnoldo Lenis, M.D.

## 2018-10-29 ENCOUNTER — Other Ambulatory Visit: Payer: Self-pay | Admitting: Family Medicine

## 2018-10-29 DIAGNOSIS — N6489 Other specified disorders of breast: Secondary | ICD-10-CM

## 2018-10-29 DIAGNOSIS — R928 Other abnormal and inconclusive findings on diagnostic imaging of breast: Secondary | ICD-10-CM | POA: Diagnosis not present

## 2018-11-04 ENCOUNTER — Ambulatory Visit
Admission: RE | Admit: 2018-11-04 | Discharge: 2018-11-04 | Disposition: A | Discharge status: Home / Self Care | Payer: Medicare HMO | Source: Ambulatory Visit | Attending: Family Medicine | Admitting: Family Medicine

## 2018-11-04 DIAGNOSIS — D242 Benign neoplasm of left breast: Secondary | ICD-10-CM | POA: Diagnosis not present

## 2018-11-04 DIAGNOSIS — R928 Other abnormal and inconclusive findings on diagnostic imaging of breast: Secondary | ICD-10-CM | POA: Diagnosis not present

## 2018-11-04 DIAGNOSIS — N6489 Other specified disorders of breast: Secondary | ICD-10-CM

## 2018-11-20 ENCOUNTER — Ambulatory Visit: Payer: Self-pay | Admitting: General Surgery

## 2018-11-20 DIAGNOSIS — D242 Benign neoplasm of left breast: Secondary | ICD-10-CM

## 2018-12-11 ENCOUNTER — Other Ambulatory Visit: Payer: Self-pay | Admitting: Cardiology

## 2018-12-26 DIAGNOSIS — E1122 Type 2 diabetes mellitus with diabetic chronic kidney disease: Secondary | ICD-10-CM | POA: Diagnosis not present

## 2018-12-26 DIAGNOSIS — I10 Essential (primary) hypertension: Secondary | ICD-10-CM | POA: Diagnosis not present

## 2018-12-26 DIAGNOSIS — E1151 Type 2 diabetes mellitus with diabetic peripheral angiopathy without gangrene: Secondary | ICD-10-CM | POA: Diagnosis not present

## 2018-12-26 DIAGNOSIS — N182 Chronic kidney disease, stage 2 (mild): Secondary | ICD-10-CM | POA: Diagnosis not present

## 2018-12-26 DIAGNOSIS — E782 Mixed hyperlipidemia: Secondary | ICD-10-CM | POA: Diagnosis not present

## 2018-12-26 DIAGNOSIS — M1000 Idiopathic gout, unspecified site: Secondary | ICD-10-CM | POA: Diagnosis not present

## 2018-12-26 DIAGNOSIS — E1165 Type 2 diabetes mellitus with hyperglycemia: Secondary | ICD-10-CM | POA: Diagnosis not present

## 2018-12-26 DIAGNOSIS — M109 Gout, unspecified: Secondary | ICD-10-CM | POA: Diagnosis not present

## 2018-12-30 DIAGNOSIS — Z6837 Body mass index (BMI) 37.0-37.9, adult: Secondary | ICD-10-CM | POA: Diagnosis not present

## 2018-12-30 DIAGNOSIS — I6523 Occlusion and stenosis of bilateral carotid arteries: Secondary | ICD-10-CM | POA: Diagnosis not present

## 2018-12-30 DIAGNOSIS — M1000 Idiopathic gout, unspecified site: Secondary | ICD-10-CM | POA: Diagnosis not present

## 2018-12-30 DIAGNOSIS — I10 Essential (primary) hypertension: Secondary | ICD-10-CM | POA: Diagnosis not present

## 2018-12-30 DIAGNOSIS — E1122 Type 2 diabetes mellitus with diabetic chronic kidney disease: Secondary | ICD-10-CM | POA: Diagnosis not present

## 2018-12-30 DIAGNOSIS — Z0001 Encounter for general adult medical examination with abnormal findings: Secondary | ICD-10-CM | POA: Diagnosis not present

## 2018-12-30 DIAGNOSIS — E782 Mixed hyperlipidemia: Secondary | ICD-10-CM | POA: Diagnosis not present

## 2018-12-30 DIAGNOSIS — I2510 Atherosclerotic heart disease of native coronary artery without angina pectoris: Secondary | ICD-10-CM | POA: Diagnosis not present

## 2019-01-09 ENCOUNTER — Telehealth: Payer: Self-pay | Admitting: Cardiology

## 2019-01-09 NOTE — Telephone Encounter (Signed)
Received request from [facility] for pre op clearance -request emailed to Dr. Harl Bowie.

## 2019-01-26 ENCOUNTER — Other Ambulatory Visit: Payer: Self-pay | Admitting: General Surgery

## 2019-01-26 DIAGNOSIS — D242 Benign neoplasm of left breast: Secondary | ICD-10-CM

## 2019-02-18 DIAGNOSIS — D242 Benign neoplasm of left breast: Secondary | ICD-10-CM | POA: Diagnosis not present

## 2019-02-24 ENCOUNTER — Telehealth: Payer: Self-pay | Admitting: Cardiology

## 2019-02-24 NOTE — Telephone Encounter (Signed)
[name] w/ [facility] called and pt is  needing surgical clearance for Breast surgery w/ Dr. Donne Hazel, breast lumpectomy w/ radioactive seed localization - surgery date is 03/03/2019.   Please put note in Epic if pt is cleared.

## 2019-02-24 NOTE — Progress Notes (Signed)
Pt will need cards clearance before surgery. I have called and spoke with her cards MD- Carlyle Dolly, MD at [phone] and spoke with Jarrett Soho who was informed of the need for this clearance and she was putting in a phone note for his nurse/surgery scheduler to have this clearance entered into Epic note. Will follow up on this clearance.

## 2019-02-24 NOTE — Telephone Encounter (Signed)
Telephone note placed, ok to proceed with surgery as planned   J BrancH MD

## 2019-02-24 NOTE — Telephone Encounter (Signed)
Ok to proceed with surgery from a cardiac standpoint   Zandra Abts MD

## 2019-02-25 ENCOUNTER — Other Ambulatory Visit: Payer: Self-pay | Admitting: General Surgery

## 2019-02-25 ENCOUNTER — Encounter (HOSPITAL_BASED_OUTPATIENT_CLINIC_OR_DEPARTMENT_OTHER): Payer: Self-pay | Admitting: *Deleted

## 2019-02-25 ENCOUNTER — Other Ambulatory Visit: Payer: Self-pay

## 2019-02-27 ENCOUNTER — Other Ambulatory Visit (HOSPITAL_COMMUNITY)
Admission: RE | Admit: 2019-02-27 | Discharge: 2019-02-27 | Disposition: A | Discharge status: Home / Self Care | Payer: Medicare HMO | Source: Ambulatory Visit | Attending: General Surgery | Admitting: General Surgery

## 2019-02-27 DIAGNOSIS — Z01812 Encounter for preprocedural laboratory examination: Secondary | ICD-10-CM | POA: Insufficient documentation

## 2019-02-27 DIAGNOSIS — Z1159 Encounter for screening for other viral diseases: Secondary | ICD-10-CM | POA: Insufficient documentation

## 2019-02-27 LAB — SARS CORONAVIRUS 2 (TAT 6-24 HRS): SARS Coronavirus 2: NEGATIVE

## 2019-03-02 ENCOUNTER — Other Ambulatory Visit: Payer: Self-pay

## 2019-03-02 ENCOUNTER — Encounter (HOSPITAL_BASED_OUTPATIENT_CLINIC_OR_DEPARTMENT_OTHER)
Admission: RE | Admit: 2019-03-02 | Discharge: 2019-03-02 | Disposition: A | Discharge status: Home / Self Care | Payer: Medicare HMO | Source: Ambulatory Visit | Attending: General Surgery | Admitting: General Surgery

## 2019-03-02 ENCOUNTER — Ambulatory Visit
Admission: RE | Admit: 2019-03-02 | Discharge: 2019-03-02 | Disposition: A | Discharge status: Home / Self Care | Payer: Medicare HMO | Source: Ambulatory Visit | Attending: General Surgery | Admitting: General Surgery

## 2019-03-02 DIAGNOSIS — R928 Other abnormal and inconclusive findings on diagnostic imaging of breast: Secondary | ICD-10-CM | POA: Diagnosis not present

## 2019-03-02 DIAGNOSIS — M1990 Unspecified osteoarthritis, unspecified site: Secondary | ICD-10-CM | POA: Diagnosis not present

## 2019-03-02 DIAGNOSIS — D242 Benign neoplasm of left breast: Secondary | ICD-10-CM

## 2019-03-02 DIAGNOSIS — I350 Nonrheumatic aortic (valve) stenosis: Secondary | ICD-10-CM | POA: Diagnosis not present

## 2019-03-02 DIAGNOSIS — Z9841 Cataract extraction status, right eye: Secondary | ICD-10-CM | POA: Diagnosis not present

## 2019-03-02 DIAGNOSIS — I440 Atrioventricular block, first degree: Secondary | ICD-10-CM | POA: Diagnosis not present

## 2019-03-02 DIAGNOSIS — I2510 Atherosclerotic heart disease of native coronary artery without angina pectoris: Secondary | ICD-10-CM | POA: Diagnosis not present

## 2019-03-02 DIAGNOSIS — Z8249 Family history of ischemic heart disease and other diseases of the circulatory system: Secondary | ICD-10-CM | POA: Diagnosis not present

## 2019-03-02 DIAGNOSIS — Z9842 Cataract extraction status, left eye: Secondary | ICD-10-CM | POA: Diagnosis not present

## 2019-03-02 DIAGNOSIS — E669 Obesity, unspecified: Secondary | ICD-10-CM | POA: Diagnosis not present

## 2019-03-02 DIAGNOSIS — M109 Gout, unspecified: Secondary | ICD-10-CM | POA: Diagnosis not present

## 2019-03-02 DIAGNOSIS — Z90710 Acquired absence of both cervix and uterus: Secondary | ICD-10-CM | POA: Diagnosis not present

## 2019-03-02 DIAGNOSIS — Z9049 Acquired absence of other specified parts of digestive tract: Secondary | ICD-10-CM | POA: Diagnosis not present

## 2019-03-02 DIAGNOSIS — I252 Old myocardial infarction: Secondary | ICD-10-CM | POA: Diagnosis not present

## 2019-03-02 DIAGNOSIS — Z803 Family history of malignant neoplasm of breast: Secondary | ICD-10-CM | POA: Diagnosis not present

## 2019-03-02 DIAGNOSIS — D0512 Intraductal carcinoma in situ of left breast: Secondary | ICD-10-CM | POA: Diagnosis not present

## 2019-03-02 DIAGNOSIS — E119 Type 2 diabetes mellitus without complications: Secondary | ICD-10-CM | POA: Diagnosis not present

## 2019-03-02 DIAGNOSIS — I10 Essential (primary) hypertension: Secondary | ICD-10-CM | POA: Diagnosis not present

## 2019-03-02 DIAGNOSIS — E785 Hyperlipidemia, unspecified: Secondary | ICD-10-CM | POA: Diagnosis not present

## 2019-03-02 DIAGNOSIS — Z951 Presence of aortocoronary bypass graft: Secondary | ICD-10-CM | POA: Diagnosis not present

## 2019-03-02 DIAGNOSIS — R350 Frequency of micturition: Secondary | ICD-10-CM | POA: Diagnosis not present

## 2019-03-02 DIAGNOSIS — Z6839 Body mass index (BMI) 39.0-39.9, adult: Secondary | ICD-10-CM | POA: Diagnosis not present

## 2019-03-02 LAB — BASIC METABOLIC PANEL
Anion gap: 11 (ref 5–15)
BUN: 24 mg/dL — ABNORMAL HIGH (ref 8–23)
CO2: 25 mmol/L (ref 22–32)
Calcium: 9.3 mg/dL (ref 8.9–10.3)
Chloride: 105 mmol/L (ref 98–111)
Creatinine, Ser: 1.18 mg/dL — ABNORMAL HIGH (ref 0.44–1.00)
GFR calc Af Amer: 53 mL/min — ABNORMAL LOW (ref >60)
GFR calc non Af Amer: 45 mL/min — ABNORMAL LOW (ref >60)
Glucose, Bld: 133 mg/dL — ABNORMAL HIGH (ref 70–99)
Potassium: 3.7 mmol/L (ref 3.5–5.1)
Sodium: 141 mmol/L (ref 135–145)

## 2019-03-02 NOTE — Progress Notes (Signed)
Gatorade 2 drink and surgical soap given with instructions, pt verbalized understanding. 

## 2019-03-03 ENCOUNTER — Ambulatory Visit (HOSPITAL_BASED_OUTPATIENT_CLINIC_OR_DEPARTMENT_OTHER): Payer: Medicare HMO | Admitting: Anesthesiology

## 2019-03-03 ENCOUNTER — Ambulatory Visit (HOSPITAL_BASED_OUTPATIENT_CLINIC_OR_DEPARTMENT_OTHER)
Admission: RE | Admit: 2019-03-03 | Discharge: 2019-03-03 | Disposition: A | Discharge status: Home / Self Care | Payer: Medicare HMO | Source: Ambulatory Visit | Attending: General Surgery | Admitting: General Surgery

## 2019-03-03 ENCOUNTER — Other Ambulatory Visit: Payer: Self-pay | Admitting: General Surgery

## 2019-03-03 ENCOUNTER — Other Ambulatory Visit: Payer: Self-pay

## 2019-03-03 ENCOUNTER — Encounter (HOSPITAL_BASED_OUTPATIENT_CLINIC_OR_DEPARTMENT_OTHER): Payer: Self-pay | Admitting: Anesthesiology

## 2019-03-03 ENCOUNTER — Encounter (HOSPITAL_BASED_OUTPATIENT_CLINIC_OR_DEPARTMENT_OTHER)
Admission: RE | Disposition: A | Discharge status: Home / Self Care | Payer: Self-pay | Source: Ambulatory Visit | Attending: General Surgery

## 2019-03-03 ENCOUNTER — Ambulatory Visit
Admission: RE | Admit: 2019-03-03 | Discharge: 2019-03-03 | Disposition: A | Discharge status: Home / Self Care | Payer: Medicare HMO | Source: Ambulatory Visit | Attending: General Surgery | Admitting: General Surgery

## 2019-03-03 DIAGNOSIS — Z803 Family history of malignant neoplasm of breast: Secondary | ICD-10-CM | POA: Diagnosis not present

## 2019-03-03 DIAGNOSIS — Z6839 Body mass index (BMI) 39.0-39.9, adult: Secondary | ICD-10-CM | POA: Insufficient documentation

## 2019-03-03 DIAGNOSIS — M1990 Unspecified osteoarthritis, unspecified site: Secondary | ICD-10-CM | POA: Insufficient documentation

## 2019-03-03 DIAGNOSIS — E785 Hyperlipidemia, unspecified: Secondary | ICD-10-CM | POA: Diagnosis not present

## 2019-03-03 DIAGNOSIS — E119 Type 2 diabetes mellitus without complications: Secondary | ICD-10-CM | POA: Insufficient documentation

## 2019-03-03 DIAGNOSIS — I440 Atrioventricular block, first degree: Secondary | ICD-10-CM | POA: Insufficient documentation

## 2019-03-03 DIAGNOSIS — I350 Nonrheumatic aortic (valve) stenosis: Secondary | ICD-10-CM | POA: Diagnosis not present

## 2019-03-03 DIAGNOSIS — R928 Other abnormal and inconclusive findings on diagnostic imaging of breast: Secondary | ICD-10-CM | POA: Diagnosis not present

## 2019-03-03 DIAGNOSIS — Z9842 Cataract extraction status, left eye: Secondary | ICD-10-CM | POA: Insufficient documentation

## 2019-03-03 DIAGNOSIS — I2510 Atherosclerotic heart disease of native coronary artery without angina pectoris: Secondary | ICD-10-CM | POA: Diagnosis not present

## 2019-03-03 DIAGNOSIS — D242 Benign neoplasm of left breast: Secondary | ICD-10-CM

## 2019-03-03 DIAGNOSIS — I10 Essential (primary) hypertension: Secondary | ICD-10-CM | POA: Insufficient documentation

## 2019-03-03 DIAGNOSIS — Z9841 Cataract extraction status, right eye: Secondary | ICD-10-CM | POA: Insufficient documentation

## 2019-03-03 DIAGNOSIS — M109 Gout, unspecified: Secondary | ICD-10-CM | POA: Insufficient documentation

## 2019-03-03 DIAGNOSIS — Z951 Presence of aortocoronary bypass graft: Secondary | ICD-10-CM | POA: Diagnosis not present

## 2019-03-03 DIAGNOSIS — D0512 Intraductal carcinoma in situ of left breast: Secondary | ICD-10-CM | POA: Insufficient documentation

## 2019-03-03 DIAGNOSIS — Z90710 Acquired absence of both cervix and uterus: Secondary | ICD-10-CM | POA: Insufficient documentation

## 2019-03-03 DIAGNOSIS — C50912 Malignant neoplasm of unspecified site of left female breast: Secondary | ICD-10-CM | POA: Diagnosis not present

## 2019-03-03 DIAGNOSIS — N6320 Unspecified lump in the left breast, unspecified quadrant: Secondary | ICD-10-CM | POA: Diagnosis not present

## 2019-03-03 DIAGNOSIS — I252 Old myocardial infarction: Secondary | ICD-10-CM | POA: Insufficient documentation

## 2019-03-03 DIAGNOSIS — R350 Frequency of micturition: Secondary | ICD-10-CM | POA: Insufficient documentation

## 2019-03-03 DIAGNOSIS — E669 Obesity, unspecified: Secondary | ICD-10-CM | POA: Insufficient documentation

## 2019-03-03 DIAGNOSIS — Z8249 Family history of ischemic heart disease and other diseases of the circulatory system: Secondary | ICD-10-CM | POA: Insufficient documentation

## 2019-03-03 DIAGNOSIS — Z9049 Acquired absence of other specified parts of digestive tract: Secondary | ICD-10-CM | POA: Insufficient documentation

## 2019-03-03 HISTORY — PX: BREAST LUMPECTOMY WITH RADIOACTIVE SEED LOCALIZATION: SHX6424

## 2019-03-03 LAB — GLUCOSE, CAPILLARY
Glucose-Capillary: 145 mg/dL — ABNORMAL HIGH (ref 70–99)
Glucose-Capillary: 132 mg/dL — ABNORMAL HIGH (ref 70–99)

## 2019-03-03 SURGERY — BREAST LUMPECTOMY WITH RADIOACTIVE SEED LOCALIZATION
Anesthesia: General | Site: Breast | Laterality: Left

## 2019-03-03 MED ORDER — DEXAMETHASONE SODIUM PHOSPHATE 10 MG/ML IJ SOLN
INTRAMUSCULAR | Status: DC | PRN
  Administered 2019-03-03: 10 mg via INTRAVENOUS

## 2019-03-03 MED ORDER — LACTATED RINGERS IV SOLN
INTRAVENOUS | Status: DC
  Administered 2019-03-03: 10:00:00 via INTRAVENOUS

## 2019-03-03 MED ORDER — GABAPENTIN 100 MG PO CAPS
ORAL_CAPSULE | ORAL | Status: AC
  Filled 2019-03-03: qty 1

## 2019-03-03 MED ORDER — CEFAZOLIN SODIUM-DEXTROSE 2-4 GM/100ML-% IV SOLN
INTRAVENOUS | Status: AC
  Filled 2019-03-03: qty 100

## 2019-03-03 MED ORDER — LIDOCAINE 2% (20 MG/ML) 5 ML SYRINGE
INTRAMUSCULAR | Status: AC
  Filled 2019-03-03: qty 5

## 2019-03-03 MED ORDER — BUPIVACAINE HCL (PF) 0.25 % IJ SOLN
INTRAMUSCULAR | Status: DC | PRN
  Administered 2019-03-03: 10 mL

## 2019-03-03 MED ORDER — FENTANYL CITRATE (PF) 100 MCG/2ML IJ SOLN
INTRAMUSCULAR | Status: AC
  Filled 2019-03-03: qty 2

## 2019-03-03 MED ORDER — SCOPOLAMINE 1 MG/3DAYS TD PT72: 1.0000 | MEDICATED_PATCH | Freq: Once | TRANSDERMAL | Status: DC

## 2019-03-03 MED ORDER — OXYCODONE HCL 5 MG/5ML PO SOLN: 5.0000 mg | Freq: Once | ORAL | Status: DC | PRN

## 2019-03-03 MED ORDER — CEFAZOLIN SODIUM-DEXTROSE 2-4 GM/100ML-% IV SOLN: 2.0000 g | INTRAVENOUS | Status: DC

## 2019-03-03 MED ORDER — PROPOFOL 500 MG/50ML IV EMUL
INTRAVENOUS | Status: DC | PRN
  Administered 2019-03-03: 25 ug/kg/min via INTRAVENOUS

## 2019-03-03 MED ORDER — ACETAMINOPHEN 500 MG PO TABS
ORAL_TABLET | ORAL | Status: AC
  Filled 2019-03-03: qty 2

## 2019-03-03 MED ORDER — PROPOFOL 10 MG/ML IV BOLUS
INTRAVENOUS | Status: AC
  Filled 2019-03-03: qty 40

## 2019-03-03 MED ORDER — FENTANYL CITRATE (PF) 100 MCG/2ML IJ SOLN
25.0000 ug | INTRAMUSCULAR | Status: DC | PRN
  Administered 2019-03-03: 50 ug via INTRAVENOUS

## 2019-03-03 MED ORDER — LIDOCAINE HCL (CARDIAC) PF 100 MG/5ML IV SOSY
PREFILLED_SYRINGE | INTRAVENOUS | Status: DC | PRN
  Administered 2019-03-03: 100 mg via INTRAVENOUS

## 2019-03-03 MED ORDER — ONDANSETRON HCL 4 MG/2ML IJ SOLN
INTRAMUSCULAR | Status: AC
  Filled 2019-03-03: qty 2

## 2019-03-03 MED ORDER — DEXAMETHASONE SODIUM PHOSPHATE 10 MG/ML IJ SOLN
INTRAMUSCULAR | Status: AC
  Filled 2019-03-03: qty 1

## 2019-03-03 MED ORDER — GABAPENTIN 100 MG PO CAPS
100.0000 mg | ORAL_CAPSULE | ORAL | Status: AC
  Administered 2019-03-03: 100 mg via ORAL

## 2019-03-03 MED ORDER — MIDAZOLAM HCL 2 MG/2ML IJ SOLN: 1.0000 mg | INTRAMUSCULAR | Status: DC | PRN

## 2019-03-03 MED ORDER — ACETAMINOPHEN 500 MG PO TABS
1000.0000 mg | ORAL_TABLET | ORAL | Status: AC
  Administered 2019-03-03: 1000 mg via ORAL

## 2019-03-03 MED ORDER — PROPOFOL 10 MG/ML IV BOLUS
INTRAVENOUS | Status: DC | PRN
  Administered 2019-03-03: 150 mg via INTRAVENOUS

## 2019-03-03 MED ORDER — METOCLOPRAMIDE HCL 5 MG/ML IJ SOLN: 10.0000 mg | Freq: Once | INTRAMUSCULAR | Status: DC | PRN

## 2019-03-03 MED ORDER — FENTANYL CITRATE (PF) 100 MCG/2ML IJ SOLN
50.0000 ug | INTRAMUSCULAR | Status: AC | PRN
  Administered 2019-03-03: 50 ug via INTRAVENOUS
  Administered 2019-03-03 (×2): 25 ug via INTRAVENOUS

## 2019-03-03 MED ORDER — OXYCODONE HCL 5 MG PO TABS: 5.0000 mg | ORAL_TABLET | Freq: Once | ORAL | Status: DC | PRN

## 2019-03-03 MED ORDER — CEFAZOLIN SODIUM-DEXTROSE 2-3 GM-%(50ML) IV SOLR
INTRAVENOUS | Status: DC | PRN
  Administered 2019-03-03: 2 g via INTRAVENOUS

## 2019-03-03 MED ORDER — ONDANSETRON HCL 4 MG/2ML IJ SOLN
INTRAMUSCULAR | Status: DC | PRN
  Administered 2019-03-03: 4 mg via INTRAVENOUS

## 2019-03-03 SURGICAL SUPPLY — 44 items
ADH SKN CLS APL DERMABOND .7 (GAUZE/BANDAGES/DRESSINGS) ×1
APL PRP STRL LF DISP 70% ISPRP (MISCELLANEOUS) ×1
BINDER BREAST XXLRG (GAUZE/BANDAGES/DRESSINGS) ×1 IMPLANT
BLADE SURG 15 STRL LF DISP TIS (BLADE) ×1 IMPLANT
BLADE SURG 15 STRL SS (BLADE) ×2
CHLORAPREP W/TINT 26 (MISCELLANEOUS) ×2 IMPLANT
CLIP VESOCCLUDE SM WIDE 6/CT (CLIP) IMPLANT
COVER BACK TABLE REUSABLE LG (DRAPES) ×2 IMPLANT
COVER MAYO STAND REUSABLE (DRAPES) ×2 IMPLANT
COVER PROBE W GEL 5X96 (DRAPES) ×2 IMPLANT
COVER WAND RF STERILE (DRAPES) IMPLANT
DERMABOND ADVANCED (GAUZE/BANDAGES/DRESSINGS) ×1
DERMABOND ADVANCED .7 DNX12 (GAUZE/BANDAGES/DRESSINGS) ×1 IMPLANT
DRAPE LAPAROSCOPIC ABDOMINAL (DRAPES) ×2 IMPLANT
DRAPE UTILITY XL STRL (DRAPES) ×2 IMPLANT
ELECT COATED BLADE 2.86 ST (ELECTRODE) ×2 IMPLANT
ELECT REM PT RETURN 9FT ADLT (ELECTROSURGICAL) ×2
ELECTRODE REM PT RTRN 9FT ADLT (ELECTROSURGICAL) ×1 IMPLANT
GLOVE BIO SURGEON STRL SZ7 (GLOVE) ×6 IMPLANT
GLOVE BIOGEL PI IND STRL 7.5 (GLOVE) ×1 IMPLANT
GLOVE BIOGEL PI INDICATOR 7.5 (GLOVE) ×3
GOWN STRL REUS W/ TWL LRG LVL3 (GOWN DISPOSABLE) ×2 IMPLANT
GOWN STRL REUS W/TWL LRG LVL3 (GOWN DISPOSABLE) ×4
KIT MARKER MARGIN INK (KITS) ×2 IMPLANT
NDL HYPO 25X1 1.5 SAFETY (NEEDLE) ×1 IMPLANT
NEEDLE HYPO 25X1 1.5 SAFETY (NEEDLE) ×2 IMPLANT
PACK BASIN DAY SURGERY FS (CUSTOM PROCEDURE TRAY) ×2 IMPLANT
PENCIL BUTTON HOLSTER BLD 10FT (ELECTRODE) ×2 IMPLANT
SLEEVE SCD COMPRESS KNEE MED (MISCELLANEOUS) ×2 IMPLANT
SPONGE LAP 4X18 RFD (DISPOSABLE) ×2 IMPLANT
STRIP CLOSURE SKIN 1/2X4 (GAUZE/BANDAGES/DRESSINGS) ×2 IMPLANT
SUT MNCRL AB 4-0 PS2 18 (SUTURE) ×2 IMPLANT
SUT MON AB 5-0 PS2 18 (SUTURE) IMPLANT
SUT SILK 2 0 SH (SUTURE) IMPLANT
SUT VIC AB 2-0 SH 27 (SUTURE) ×2
SUT VIC AB 2-0 SH 27XBRD (SUTURE) ×1 IMPLANT
SUT VIC AB 3-0 SH 27 (SUTURE) ×2
SUT VIC AB 3-0 SH 27X BRD (SUTURE) ×1 IMPLANT
SUT VIC AB 5-0 PS2 18 (SUTURE) IMPLANT
SYR CONTROL 10ML LL (SYRINGE) ×2 IMPLANT
TOWEL GREEN STERILE FF (TOWEL DISPOSABLE) ×2 IMPLANT
TRAY FAXITRON CT DISP (TRAY / TRAY PROCEDURE) ×2 IMPLANT
TUBE CONNECTING 20X1/4 (TUBING) IMPLANT
YANKAUER SUCT BULB TIP NO VENT (SUCTIONS) IMPLANT

## 2019-03-03 NOTE — Discharge Instructions (Signed)
[facility] °Office Phone Number [phone] ° °POST OP INSTRUCTIONS °Take 400 mg of ibuprofen every 8 hours or 650 mg tylenol every 6 hours for next 72 hours then as needed. Use ice several times daily also. °Always review your discharge instruction sheet given to you by the facility where your surgery was performed. ° °IF YOU HAVE DISABILITY OR FAMILY LEAVE FORMS, YOU MUST BRING THEM TO THE OFFICE FOR PROCESSING.  DO NOT GIVE THEM TO YOUR DOCTOR. ° °1. A prescription for pain medication may be given to you upon discharge.  Take your pain medication as prescribed, if needed.  If narcotic pain medicine is not needed, then you may take acetaminophen (Tylenol), naprosyn (Alleve) or ibuprofen (Advil) as needed. °2. Take your usually prescribed medications unless otherwise directed °3. If you need a refill on your pain medication, please contact your pharmacy.  They will contact our office to request authorization.  Prescriptions will not be filled after 5pm or on week-ends. °4. You should eat very light the first 24 hours after surgery, such as soup, crackers, pudding, etc.  Resume your normal diet the day after surgery. °5. Most patients will experience some swelling and bruising in the breast.  Ice packs and a good support bra will help.  Wear the breast binder provided or a sports bra for 72 hours day and night.  After that wear a sports bra during the day until you return to the office. Swelling and bruising can take several days to resolve.  °6. It is common to experience some constipation if taking pain medication after surgery.  Increasing fluid intake and taking a stool softener will usually help or prevent this problem from occurring.  A mild laxative (Milk of Magnesia or Miralax) should be taken according to package directions if there are no bowel movements after 48 hours. °7. Unless discharge instructions indicate otherwise, you may remove your bandages 48 hours after surgery and you may  shower at that time.  You may have steri-strips (small skin tapes) in place directly over the incision.  These strips should be left on the skin for 7-10 days and will come off on their own.  If your surgeon used skin glue on the incision, you may shower in 24 hours.  The glue will flake off over the next 2-3 weeks.  Any sutures or staples will be removed at the office during your follow-up visit. °8. ACTIVITIES:  You may resume regular daily activities (gradually increasing) beginning the next day.  Wearing a good support bra or sports bra minimizes pain and swelling.  You may have sexual intercourse when it is comfortable. °a. You may drive when you no longer are taking prescription pain medication, you can comfortably wear a seatbelt, and you can safely maneuver your car and apply brakes. °b. RETURN TO WORK:  ______________________________________________________________________________________ °9. You should see your doctor in the office for a follow-up appointment approximately two weeks after your surgery.  Your doctor’s nurse will typically make your follow-up appointment when she calls you with your pathology report.  Expect your pathology report 3-4 business days after your surgery.  You may call to check if you do not hear from us after three days. °10. OTHER INSTRUCTIONS: _______________________________________________________________________________________________ _____________________________________________________________________________________________________________________________________ °_____________________________________________________________________________________________________________________________________ °_____________________________________________________________________________________________________________________________________ ° °WHEN TO CALL DR [place]KEFIELD: °1. Fever over 101.0 °2. Nausea and/or vomiting. °3. Extreme swelling or bruising. °4. Continued bleeding from  incision. °5. Increased pain, redness, or drainage from the incision. ° °The clinic staff is available to   answer your questions during regular business hours.  Please dont hesitate to call and ask to speak to one of the nurses for clinical concerns.  If you have a medical emergency, go to the nearest emergency room or call 911.  A surgeon from [facility] is always on call at the hospital.  For further questions, please visit [email or web address]     Post Anesthesia Home Care Instructions  Activity: Get plenty of rest for the remainder of the day. A responsible individual must stay with you for 24 hours following the procedure.  For the next 24 hours, DO NOT: -Drive a car -Paediatric nurse -Drink alcoholic beverages -Take any medication unless instructed by your physician -Make any legal decisions or sign important papers.  Meals: Start with liquid foods such as gelatin or soup. Progress to regular foods as tolerated. Avoid greasy, spicy, heavy foods. If nausea and/or vomiting occur, drink only clear liquids until the nausea and/or vomiting subsides. Call your physician if vomiting continues.  Special Instructions/Symptoms: Your throat may feel dry or sore from the anesthesia or the breathing tube placed in your throat during surgery. If this causes discomfort, gargle with warm salt water. The discomfort should disappear within 24 hours.  If you had a scopolamine patch placed behind your ear for the management of post- operative nausea and/or vomiting:  1. The medication in the patch is effective for 72 hours, after which it should be removed.  Wrap patch in a tissue and discard in the trash. Wash hands thoroughly with soap and water. 2. You may remove the patch earlier than 72 hours if you experience unpleasant side effects which may include dry mouth, dizziness or visual disturbances. 3. Avoid touching the patch. Wash your hands with soap and water after contact  with the patch.

## 2019-03-03 NOTE — Anesthesia Procedure Notes (Signed)
Procedure Name: LMA Insertion Performed by: Verita Lamb, CRNA Pre-anesthesia Checklist: Patient identified, Emergency Drugs available, Suction available, Patient being monitored and Timeout performed Patient Re-evaluated:Patient Re-evaluated prior to induction Oxygen Delivery Method: Circle system utilized Preoxygenation: Pre-oxygenation with 100% oxygen Induction Type: IV induction LMA: LMA inserted LMA Size: 4.0 Tube type: Oral Number of attempts: 1 Dental Injury: Teeth and Oropharynx as per pre-operative assessment  Comments: Poor dentition with multiple loose and missing teeth. Risks discussed. Atraumatic lma insertion, teeth as preop

## 2019-03-03 NOTE — Transfer of Care (Signed)
Immediate Anesthesia Transfer of Care Note  Patient: [name]  Procedure(s) Performed: LEFT BREAST LUMPECTOMY WITH RADIOACTIVE SEED LOCALIZATION (Left Breast)  Patient Location: PACU  Anesthesia Type:General  Level of Consciousness: awake, alert  and oriented  Airway & Oxygen Therapy: Patient Spontanous Breathing and Patient connected to nasal cannula oxygen  Post-op Assessment: Report given to RN and Post -op Vital signs reviewed and stable  Post vital signs: Reviewed and stable  Last Vitals:  Vitals Value Taken Time  BP    Temp    Pulse    Resp    SpO2      Last Pain:  Vitals:   03/03/19 1011  TempSrc: Oral  PainSc: 0-No pain      Patients Stated Pain Goal: 3 (96/11/64 3539)  Complications: No apparent anesthesia complications

## 2019-03-03 NOTE — Anesthesia Postprocedure Evaluation (Signed)
Anesthesia Post Note  Patient: [name]  Procedure(s) Performed: LEFT BREAST LUMPECTOMY WITH RADIOACTIVE SEED LOCALIZATION (Left Breast)     Patient location during evaluation: PACU Anesthesia Type: General Level of consciousness: awake and alert and oriented Pain management: pain level controlled Vital Signs Assessment: post-procedure vital signs reviewed and stable Respiratory status: spontaneous breathing, nonlabored ventilation and respiratory function stable Cardiovascular status: blood pressure returned to baseline and stable Postop Assessment: no apparent nausea or vomiting Anesthetic complications: no    Last Vitals:  Vitals:   03/03/19 1300 03/03/19 1315  BP: 138/64 135/62  Pulse: 72 69  Resp: 18 18  Temp:    SpO2: 100% 100%    Last Pain:  Vitals:   03/03/19 1315  TempSrc:   PainSc: 0-No pain                 Cass Edinger A.

## 2019-03-03 NOTE — Op Note (Signed)
Preoperative diagnosis:left breast mass on mammography Postoperative diagnosis: Same as above Procedure:Left breast seed guided excisional biopsy Surgeon: Dr. Serita Grammes Anesthesia: General Estimated blood loss: 10 cc Specimens: Left breast tissue marked with paint containing seed and clip Complications: None Drains: None Sponge and needle count was correct at completion Disposition to recovery in stable condition  Indications: This is a 22 yof who had abnormal left mammogram. This underwent a core biopsy and is a papilloma but read as discordant. She is recommended for excision. She was delayed due to covid.    Procedure: She had a radioactive seed placed prior to beginning.  I had these mammograms in the operating room.  After informed consent was obtained she was then taken to the operating room.  She was given antibiotics.  SCDs were in place.  She was placed under general anesthesia without complication.  She was prepped and draped in the standard sterile surgical fashion.  A surgical timeout was then performed.  I infiltrated Marcaine around the lower inner portion of the areola.  I then made a areolar incision in order to hide the scar later.  I used the neoprobe to guide excision of the radioactive seed and the surrounding tissue.  This was marked with paint.  Mammogram confirmed removal of the seed and the clip.  Hemostasis was observed.  I then closed the tissue with a 2-0 Vicryl.  The dermis was closed with 3-0 Vicryl and the skin was closed with 5-0 Monocryl.  Glue and Steri-Strips were applied. She tolerated this well was extubated and transferred to recovery stable.

## 2019-03-03 NOTE — Anesthesia Preprocedure Evaluation (Signed)
Anesthesia Evaluation  Patient identified by MRN, date of birth, ID band Patient awake    Reviewed: Allergy & Precautions, NPO status , Patient's Chart, lab work & pertinent test results  Airway Mallampati: II  TM Distance: >3 FB Neck ROM: Full    Dental no notable dental hx. (+) Missing, Dental Advisory Given   Pulmonary neg pulmonary ROS,    Pulmonary exam normal breath sounds clear to auscultation       Cardiovascular hypertension, Pt. on medications and Pt. on home beta blockers + CAD, + Past MI, + Cardiac Stents and + CABG  + dysrhythmias  Rhythm:Regular Rate:Bradycardia  EKG 09/2018 SB, 1st deg AV Block, old anterior wall MI  Echo 09/03/2011 Left ventricle: The cavity size was normal. Systolic  function was normal. The estimated ejection fraction was in the range of 55% to 60%. There was an increased relative ontribution of atrial contraction to ventricular filling.    Neuro/Psych negative neurological ROS  negative psych ROS   GI/Hepatic negative GI ROS, Neg liver ROS,   Endo/Other  diabetes, Well Controlled, Type 2, Oral Hypoglycemic AgentsMorbid obesityPapilloma left breast Hyperlipidemia Gout  Renal/GU negative Renal ROS  negative genitourinary   Musculoskeletal  (+) Arthritis , Osteoarthritis,    Abdominal (+) + obese,   Peds  Hematology negative hematology ROS (+)   Anesthesia Other Findings   Reproductive/Obstetrics                             Anesthesia Physical Anesthesia Plan  ASA: III  Anesthesia Plan: General   Post-op Pain Management:    Induction: Intravenous  PONV Risk Score and Plan: 4 or greater and Ondansetron, Treatment may vary due to age or medical condition and Dexamethasone  Airway Management Planned: LMA  Additional Equipment:   Intra-op Plan:   Post-operative Plan: Extubation in OR  Informed Consent: I have reviewed the patients History and  Physical, chart, labs and discussed the procedure including the risks, benefits and alternatives for the proposed anesthesia with the patient or authorized representative who has indicated his/her understanding and acceptance.     Dental advisory given  Plan Discussed with: CRNA  Anesthesia Plan Comments:         Anesthesia Quick Evaluation

## 2019-03-03 NOTE — Brief Op Note (Signed)
03/03/2019  12:47 PM  PATIENT:  [name]  73 y.o. female  PRE-OPERATIVE DIAGNOSIS:  PAPILLOMA LEFT BREAST  POST-OPERATIVE DIAGNOSIS:  PAPILLOMA LEFT BREAST  PROCEDURE:  Procedure(s): LEFT BREAST LUMPECTOMY WITH RADIOACTIVE SEED LOCALIZATION (Left)  SURGEON:  Surgeon(s) and Role:    Rolm Bookbinder, MD - Primary  PHYSICIAN ASSISTANT:   ASSISTANTS: none   ANESTHESIA:   general  EBL:  minimal  BLOOD ADMINISTERED:none  DRAINS: none   LOCAL MEDICATIONS USED:  MARCAINE     SPECIMEN:  Excision  DISPOSITION OF SPECIMEN:  PATHOLOGY  COUNTS:  YES  TOURNIQUET:  * No tourniquets in log *  DICTATION: .Dragon Dictation  PLAN OF CARE: Discharge to home after PACU  PATIENT DISPOSITION:  PACU - hemodynamically stable.   Delay start of Pharmacological VTE agent (>24hrs) due to surgical blood loss or risk of bleeding: not applicable

## 2019-03-03 NOTE — H&P (Signed)
[name] is an 73 y.o. female.   Chief Complaint: breast mass HPI: 73 year old female referred for recent abnormal mammogram and large core needle biopsy showing papilloma. she had been scheduled with Dr Excell Seltzer previously but was cancelled due to covid and Dr Excell Seltzer is out now. She has had routine screening mammograms for years. No personal history of breast problems. She does have 2 nieces, daughters of her sister who have had breast cancer. She had a routine screening mammogram showing possible distortion in the left breast. Diagnostic mammogram revealed a small area of persistent distortion in the central to slightly lower in her left breast. Questionable small area of hyperdense shadowing several millimeters on ultrasound but difficult to reproduce. Stereotactic core needle biopsy was recommended and performed showing a small intraductal papilloma associated with usual ductal hyperplasia. this is read as discordant No evidence of malignancy or sclerosing lesion. Patient was referred for consideration for excisional biopsy. She has no breast symptoms, specifically lump or pain, nipple discharge or skin changes. Has some significant chronic medical problems with diabetes, hypertension and coronary artery disease all seeming stable.   Past Medical History:  Diagnosis Date  . Aortic stenosis   . Arthritis   . AV block, 1st degree   . Back pain    occasionally with weather changes  . CAD (coronary artery disease)    Non-STEMI/Taxus stenting 100% left anterior descending (2), January 2008. Residual 70% circumflex;80% right coronary artery. Mild LVD (EF 45%);improved to 55-60% by 2-D echocardiogram,January 2009.   . DM (diabetes mellitus) ([place])    takes Metformin 102m BID  . Dyslipidemia    takes Pravastatin daily  . Early cataracts, bilateral   . Gout    takes Allopurinol daily  . Herniated disc    left  . Lumbar herniated disc   . Myocardial infarction ([facility] 2008   pt  has 2 stents  . Nocturia   . Obesity   . Unspecified essential hypertension    takes Metoprolol,Lisinopril,and Amlodipine  . Urinary frequency     Past Surgical History:  Procedure Laterality Date  . ABDOMINAL HYSTERECTOMY    . BACK SURGERY  2009/2010/2011  . BREAST BIOPSY  unknown   left  . CARDIAC CATHETERIZATION  2008   NON-STEMI/TAXUS STENTING 100% PROXIMAL LAD (X2) JANUARY 2008  . CARDIAC CATHETERIZATION  2012   occluded LAD stents, Om1 : 70%, RCA: 80%, Ef 40-45%  . CARPAL TUNNEL RELEASE     bilateral  . CATARACT EXTRACTION W/PHACO Right 10/11/2017   Procedure: CATARACT EXTRACTION PHACO AND INTRAOCULAR LENS PLACEMENT RIGHT EYE;  Surgeon: WBaruch Goldmann MD;  Location: [facility];  Service: Ophthalmology;  Laterality: Right;  CDE: 5.17  . CATARACT EXTRACTION W/PHACO Left 12/27/2017   Procedure: CATARACT EXTRACTION PHACO AND INTRAOCULAR LENS PLACEMENT (IOC);  Surgeon: WBaruch Goldmann MD;  Location: [facility];  Service: Ophthalmology;  Laterality: Left;  CDE: 2.99  . CHOLECYSTECTOMY  not sure  . COLONOSCOPY    . CORONARY ARTERY BYPASS GRAFT  09/05/2011   Procedure: CORONARY ARTERY BYPASS GRAFTING (CABG);  Surgeon: EGrace Isaac MD;  [facility]  Service: Open Heart Surgery;  Laterality: N/A;  CABG times three using left internal mammary artery and left leg greater saphenous vein harvested endoscopically  . LEG TENDON SURGERY  unknown   left   . LUMBAR LAMINECTOMY/DECOMPRESSION MICRODISCECTOMY Left 06/04/2014   Procedure: LUMBAR LAMINECTOMY/DECOMPRESSION MICRODISCECTOMY LUMBAR FOUR-FIVE;  Surgeon: GElaina Hoops MD;  [facility];  Service: Neurosurgery;  Laterality: Left;  [name] MASS EXCISION Left 08/08/2018   Procedure: EXCISION 5CM LIPOMA ON BACK;  Surgeon: Virl Cagey, MD;  Location: [facility];  Service: General;  Laterality: Left;  . TONSILLECTOMY      Family History  Problem Relation Age of Onset  . Heart attack Sister 30  . Heart attack Brother 9  . Anesthesia  problems Neg Hx   . Hypotension Neg Hx   . Malignant hyperthermia Neg Hx   . Pseudochol deficiency Neg Hx    Social History:  reports that she has never smoked. She has never used smokeless tobacco. She reports that she does not drink alcohol or use drugs.  Allergies: No Known Allergies  No medications prior to admission.    Results for orders placed or performed during the hospital encounter of 03/03/19 (from the past 48 hour(s))  Basic metabolic panel     Status: Abnormal   Collection Time: 03/02/19 12:15 PM  Result Value Ref Range   Sodium 141 135 - 145 mmol/L   Potassium 3.7 3.5 - 5.1 mmol/L   Chloride 105 98 - 111 mmol/L   CO2 25 22 - 32 mmol/L   Glucose, Bld 133 (H) 70 - 99 mg/dL   BUN 24 (H) 8 - 23 mg/dL   Creatinine, Ser 1.18 (H) 0.44 - 1.00 mg/dL   Calcium 9.3 8.9 - 10.3 mg/dL   GFR calc non Af Amer 45 (L) >60 mL/min   GFR calc Af Amer 53 (L) >60 mL/min   Anion gap 11 5 - 15    Comment: Performed at [facility], [address] [place], [place] [address]   Mm Lt Radioactive Seed Loc Mammo Guide  Result Date: 03/02/2019 CLINICAL DATA:  73 year old female presenting for radioactive seed localization of the left breast prior to excisional biopsy. EXAM: MAMMOGRAPHIC GUIDED RADIOACTIVE SEED LOCALIZATION OF THE LEFT BREAST COMPARISON:  Previous exam(s). FINDINGS: Patient presents for radioactive seed localization prior to excisional biopsy of the left breast. I met with the patient and we discussed the procedure of seed localization including benefits and alternatives. We discussed the high likelihood of a successful procedure. We discussed the risks of the procedure including infection, bleeding, tissue injury and further surgery. We discussed the low dose of radioactivity involved in the procedure. Informed, written consent was given. The usual time-out protocol was performed immediately prior to the procedure. Using mammographic guidance, sterile technique, 1% lidocaine and  an I-125 radioactive seed, the X shaped biopsy marking clip in the lower-inner quadrant of the left breast was localized using a medial approach. The follow-up mammogram images confirm the seed in the expected location and were marked for Dr. Excell Seltzer. Follow-up survey of the patient confirms presence of the radioactive seed. Order number of I-125 seed:  [id number]. Total activity:  0.272 millicuries reference Date: 01/29/2019 The patient tolerated the procedure well and was released from the [place]. She was given instructions regarding seed removal. IMPRESSION: Radioactive seed localization left breast. No apparent complications. Electronically Signed   By: Ammie Ferrier M.D.   On: 03/02/2019 14:47    Review of Systems  All other systems reviewed and are negative.   Height _0  (1.575 m), weight 94.5 kg. Physical Exam  Breast Nipples-No Discharge. Breast Lump-No Palpable Breast Mass. cv rrr pulm clear bilaterally  Assessment/Plan PAPILLOMA OF LEFT BREAST (D24.2) Story: left breast seed guided excisional biopsy Recent abnormal screening mammogram with distortion and stereotactic biopsy showing benign papilloma. read as discordant  We discussed that there is a small likely less than 10% chance of an underlying early or in situ malignancy. I discussed options with the patient of close follow-up with imaging versus excision. She very strongly wants the area removed. I don't think this is unreasonable. I discussed radioactive seed localized left breast lumpectomy under general anesthesia as an outpatient. Discussed possible findings. Discussed risks of anesthetic complications, bleeding, infection, possible need for further surgery based on final pathology. All her questions were answered. Will proceed with surgery now   Rolm Bookbinder, MD 03/03/2019, 8:28 AM

## 2019-03-04 ENCOUNTER — Encounter (HOSPITAL_BASED_OUTPATIENT_CLINIC_OR_DEPARTMENT_OTHER): Payer: Self-pay | Admitting: General Surgery

## 2019-03-11 ENCOUNTER — Encounter: Payer: Self-pay | Admitting: Adult Health

## 2019-03-11 ENCOUNTER — Encounter (HOSPITAL_COMMUNITY): Payer: Self-pay | Admitting: *Deleted

## 2019-03-11 DIAGNOSIS — Z170 Estrogen receptor positive status [ER+]: Secondary | ICD-10-CM | POA: Insufficient documentation

## 2019-03-11 DIAGNOSIS — C50312 Malignant neoplasm of lower-inner quadrant of left female breast: Secondary | ICD-10-CM | POA: Insufficient documentation

## 2019-03-12 ENCOUNTER — Encounter (HOSPITAL_COMMUNITY): Payer: Self-pay | Admitting: Hematology

## 2019-03-12 ENCOUNTER — Inpatient Hospital Stay (HOSPITAL_COMMUNITY): Payer: Medicare HMO | Attending: Hematology | Admitting: Hematology

## 2019-03-12 ENCOUNTER — Inpatient Hospital Stay (HOSPITAL_COMMUNITY): Payer: Medicare HMO

## 2019-03-12 ENCOUNTER — Other Ambulatory Visit: Payer: Self-pay

## 2019-03-12 VITALS — BP 150/65 | HR 84 | Temp 97.9°F | Resp 18 | Ht 62.0 in | Wt 211.6 lb

## 2019-03-12 DIAGNOSIS — I252 Old myocardial infarction: Secondary | ICD-10-CM | POA: Diagnosis not present

## 2019-03-12 DIAGNOSIS — C50312 Malignant neoplasm of lower-inner quadrant of left female breast: Secondary | ICD-10-CM | POA: Insufficient documentation

## 2019-03-12 DIAGNOSIS — Z8249 Family history of ischemic heart disease and other diseases of the circulatory system: Secondary | ICD-10-CM | POA: Diagnosis not present

## 2019-03-12 DIAGNOSIS — Z820 Family history of epilepsy and other diseases of the nervous system: Secondary | ICD-10-CM | POA: Diagnosis not present

## 2019-03-12 DIAGNOSIS — Z780 Asymptomatic menopausal state: Secondary | ICD-10-CM

## 2019-03-12 DIAGNOSIS — I10 Essential (primary) hypertension: Secondary | ICD-10-CM | POA: Diagnosis not present

## 2019-03-12 DIAGNOSIS — Z832 Family history of diseases of the blood and blood-forming organs and certain disorders involving the immune mechanism: Secondary | ICD-10-CM | POA: Insufficient documentation

## 2019-03-12 DIAGNOSIS — Z170 Estrogen receptor positive status [ER+]: Secondary | ICD-10-CM | POA: Diagnosis not present

## 2019-03-12 LAB — CBC WITH DIFFERENTIAL/PLATELET
Abs Immature Granulocytes: 0.05 10*3/uL (ref 0.00–0.07)
Basophils Absolute: 0.1 10*3/uL (ref 0.0–0.1)
Basophils Relative: 1 %
Eosinophils Absolute: 0.2 10*3/uL (ref 0.0–0.5)
Eosinophils Relative: 3 %
HCT: 38 % (ref 36.0–46.0)
Hemoglobin: 12.1 g/dL (ref 12.0–15.0)
Immature Granulocytes: 1 %
Lymphocytes Relative: 27 %
Lymphs Abs: 2.2 10*3/uL (ref 0.7–4.0)
MCH: 29.7 pg (ref 26.0–34.0)
MCHC: 31.8 g/dL (ref 30.0–36.0)
MCV: 93.1 fL (ref 80.0–100.0)
Monocytes Absolute: 0.6 10*3/uL (ref 0.1–1.0)
Monocytes Relative: 8 %
Neutro Abs: 4.8 10*3/uL (ref 1.7–7.7)
Neutrophils Relative %: 60 %
Platelets: 287 10*3/uL (ref 150–400)
RBC: 4.08 MIL/uL (ref 3.87–5.11)
RDW: 13.6 % (ref 11.5–15.5)
WBC: 7.9 10*3/uL (ref 4.0–10.5)
nRBC: 0 % (ref 0.0–0.2)

## 2019-03-12 LAB — COMPREHENSIVE METABOLIC PANEL
ALT: 23 U/L (ref 0–44)
AST: 19 U/L (ref 15–41)
Albumin: 4.1 g/dL (ref 3.5–5.0)
Alkaline Phosphatase: 43 U/L (ref 38–126)
Anion gap: 11 (ref 5–15)
BUN: 29 mg/dL — ABNORMAL HIGH (ref 8–23)
CO2: 24 mmol/L (ref 22–32)
Calcium: 9.1 mg/dL (ref 8.9–10.3)
Chloride: 104 mmol/L (ref 98–111)
Creatinine, Ser: 1.44 mg/dL — ABNORMAL HIGH (ref 0.44–1.00)
GFR calc Af Amer: 41 mL/min — ABNORMAL LOW (ref >60)
GFR calc non Af Amer: 36 mL/min — ABNORMAL LOW (ref >60)
Glucose, Bld: 144 mg/dL — ABNORMAL HIGH (ref 70–99)
Potassium: 3.7 mmol/L (ref 3.5–5.1)
Sodium: 139 mmol/L (ref 135–145)
Total Bilirubin: 0.9 mg/dL (ref 0.3–1.2)
Total Protein: 7 g/dL (ref 6.5–8.1)

## 2019-03-12 NOTE — Patient Instructions (Addendum)
[place] at [facility] Discharge Instructions  You were seen today by Dr. Delton Coombes. He went over your history, family history and how you've been feeling lately. He will have blood drawn today and schedule you for a bone density test. He will see you back in 3 weeks for follow up.   Thank you for choosing [place] at [facility] to provide your oncology and hematology care.  To afford each patient quality time with our provider, please arrive at least 15 minutes before your scheduled appointment time.   If you have a lab appointment with the [facility] please come in thru the  Main Entrance and check in at the main information desk  You need to re-schedule your appointment should you arrive 10 or more minutes late.  We strive to give you quality time with our providers, and arriving late affects you and other patients whose appointments are after yours.  Also, if you no show three or more times for appointments you may be dismissed from the clinic at the providers discretion.     Again, thank you for choosing [facility].  Our hope is that these requests will decrease the amount of time that you wait before being seen by our physicians.       _____________________________________________________________  Should you have questions after your visit to [facility], please contact our office at [phone] between the hours of 8:00 a.m. and 4:30 p.m.  Voicemails left after 4:00 p.m. will not be returned until the following business day.  For prescription refill requests, have your pharmacy contact our office and allow 72 hours.    Cancer Center Support Programs:   > Cancer Support Group  2nd Tuesday of the month 1pm-2pm, Journey Room

## 2019-03-12 NOTE — Assessment & Plan Note (Addendum)
1.  Stage Ia (PT1bPN0) left breast IDC: - She had a abnormal mammogram followed by a left breast biopsy on 11/04/2018 which showed small intraductal papilloma with usual ductal hyperplasia.  No evidence of malignancy. - She was scheduled with Dr. Hoxworth but was canceled due to COVID. - She was seen by Dr. Wakefield and underwent left lumpectomy on 03/03/2019.  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  ER was 100%, PR was 0% and HER-2 was negative.  Ki-67 was 5%.  PT1BNX. - Lumpectomy scar is healing well.  No palpable lymphadenopathy. - Given the low-grade and small size of the tumor, she does not require any adjuvant chemotherapy. -I have recommended antiestrogen therapy for at least 5 years.  We will obtain a baseline bone density test. -We will check comprehensive metabolic panel today. -We will make a referral to radiation therapy.  2.  Bone mineral density: -She is on vitamin D 2000 units daily.  We will check her vitamin D level. -We will obtain a baseline DEXA scan. 

## 2019-03-12 NOTE — Progress Notes (Signed)
[facility] CONSULT NOTE  Patient Care Team: [place], Virgina Evener, MD as PCP - [place], Alphonse Guild, MD as PCP - Cardiology (Cardiology) Kary Kos, MD (Neurosurgery) Grace Isaac, MD (Cardiothoracic Surgery) de Stanford Scotland, MD (Inactive) (Cardiology)  CHIEF COMPLAINTS/PURPOSE OF CONSULTATION:  Newly diagnosed left breast cancer  HISTORY OF PRESENTING ILLNESS:  [name] 73 y.o. female is seen in consultation today for further work-up and management of newly diagnosed left breast infiltrating ductal carcinoma.  She had a abnormal mammogram and needle biopsy on 11/04/2018 of the left breast showed small intraductal papilloma with usual ductal hyperplasia.  She was scheduled with Dr. Excell Seltzer previously for lumpectomy but was canceled due to COVID-19.  She was seen by Dr. Donne Hazel and a lumpectomy was done on 03/03/2019 which showed invasive ductal carcinoma arising in a complex sclerosing lesion, grade 1, spanning 0.8 cm.  Low-grade DCIS present.  Margins were free.  She has recovered very well from surgery.  She never had previous biopsies on the breast.  She lives by herself at home.  She worked in Psychologist, educational jobs prior to retirement.  She was never smoker.  She had a history of MI in 2008 and CABG in 2013.  She also has history of diabetes, hypertension and gout.  She reports slight numbness in her right leg.  Appetite is reported 75%.  Energy levels 25%.  I reviewed her records extensively and collaborated the history with the patient.  SUMMARY OF ONCOLOGIC HISTORY: Oncology History  Malignant neoplasm of lower-inner quadrant of left breast in female, estrogen receptor positive ([place])  03/03/2019 Cancer Staging   Staging form: Breast, AJCC 8th Edition - Pathologic stage from 03/03/2019: Stage IA (pT1b, pN0, cM0, G1, ER+, PR-, HER2-) - Signed by Gardenia Phlegm, NP on 03/11/2019   03/11/2019 Initial Diagnosis   Malignant neoplasm of lower-inner quadrant  of left breast in female, estrogen receptor positive ([place])     In terms of breast cancer risk profile:  She menarched at early age of 30 and went to menopause at age 38 when she had TAH and BSO. She had used estrogen as hormone replacement therapy after hysterectomy for many years.  She never had a breast biopsy.  However she has family history significant for breast cancer in 2 of her nieces (her sister's daughters) in their 61s.   MEDICAL HISTORY:  Past Medical History:  Diagnosis Date  . Aortic stenosis   . Arthritis   . AV block, 1st degree   . Back pain    occasionally with weather changes  . CAD (coronary artery disease)    Non-STEMI/[place] stenting 100% left anterior descending (2), January 2008. Residual 70% circumflex;80% right coronary artery. Mild LVD (EF 45%);improved to 55-60% by 2-D echocardiogram,January 2009.   . DM (diabetes mellitus) ([place])    takes Metformin 1019m BID  . Dyslipidemia    takes Pravastatin daily  . Early cataracts, bilateral   . Gout    takes Allopurinol daily  . Herniated disc    left  . Lumbar herniated disc   . Myocardial infarction ([facility] 2008   pt has 2 stents  . Nocturia   . Obesity   . Unspecified essential hypertension    takes Metoprolol,Lisinopril,and Amlodipine  . Urinary frequency     SURGICAL HISTORY: Past Surgical History:  Procedure Laterality Date  . ABDOMINAL HYSTERECTOMY    . BACK SURGERY  2009/2010/2011  . BREAST BIOPSY  unknown   left  .  BREAST LUMPECTOMY WITH RADIOACTIVE SEED LOCALIZATION Left 03/03/2019   Procedure: LEFT BREAST LUMPECTOMY WITH RADIOACTIVE SEED LOCALIZATION;  Surgeon: Rolm Bookbinder, MD;  Location: [place];  Service: General;  Laterality: Left;  . CARDIAC CATHETERIZATION  2008   NON-STEMI/TAXUS STENTING 100% PROXIMAL LAD (X2) JANUARY 2008  . CARDIAC CATHETERIZATION  2012   occluded LAD stents, Om1 : 70%, RCA: 80%, Ef 40-45%  . CARPAL TUNNEL RELEASE     bilateral  . CATARACT  EXTRACTION W/PHACO Right 10/11/2017   Procedure: CATARACT EXTRACTION PHACO AND INTRAOCULAR LENS PLACEMENT RIGHT EYE;  Surgeon: Baruch Goldmann, MD;  Location: [facility];  Service: Ophthalmology;  Laterality: Right;  CDE: 5.17  . CATARACT EXTRACTION W/PHACO Left 12/27/2017   Procedure: CATARACT EXTRACTION PHACO AND INTRAOCULAR LENS PLACEMENT (IOC);  Surgeon: Baruch Goldmann, MD;  Location: [facility];  Service: Ophthalmology;  Laterality: Left;  CDE: 2.99  . CHOLECYSTECTOMY  not sure  . COLONOSCOPY    . CORONARY ARTERY BYPASS GRAFT  09/05/2011   Procedure: CORONARY ARTERY BYPASS GRAFTING (CABG);  Surgeon: Grace Isaac, MD;  Location: [place];  Service: Open Heart Surgery;  Laterality: N/A;  CABG times three using left internal mammary artery and left leg greater saphenous vein harvested endoscopically  . LEG TENDON SURGERY  unknown   left   . LUMBAR LAMINECTOMY/DECOMPRESSION MICRODISCECTOMY Left 06/04/2014   Procedure: LUMBAR LAMINECTOMY/DECOMPRESSION MICRODISCECTOMY LUMBAR FOUR-FIVE;  Surgeon: Elaina Hoops, MD;  Location: [place] [facility];  Service: Neurosurgery;  Laterality: Left;  [name] MASS EXCISION Left 08/08/2018   Procedure: EXCISION 5CM LIPOMA ON BACK;  Surgeon: Virl Cagey, MD;  Location: [facility];  Service: General;  Laterality: Left;  . TONSILLECTOMY      SOCIAL HISTORY: Social History   Socioeconomic History  . Marital status: Widowed    Spouse name: Not on file  . Number of children: 2  . Years of education: Not on file  . Highest education level: Not on file  Occupational History  . Not on file  Social Needs  . Financial resource strain: Not hard at all  . Food insecurity    Worry: Never true    Inability: Never true  . Transportation needs    Medical: No    Non-medical: No  Tobacco Use  . Smoking status: Never Smoker  . Smokeless tobacco: Never Used  Substance and Sexual Activity  . Alcohol use: No    Alcohol/week: 0.0 standard drinks  . Drug use: No  . Sexual activity: Not  Currently    Birth control/protection: Surgical  Lifestyle  . Physical activity    Days per week: 0 days    Minutes per session: 0 min  . Stress: Only a little  Relationships  . Social connections    Talks on phone: More than three times a week    Gets together: More than three times a week    Attends religious service: More than 4 times per year    Active member of club or organization: Yes    Attends meetings of clubs or organizations: 1 to 4 times per year    Relationship status: Widowed  . Intimate partner violence    Fear of current or ex partner: No    Emotionally abused: No    Physically abused: No    Forced sexual activity: No  Other Topics Concern  . Not on file  Social History Narrative  . Not on file    FAMILY HISTORY: Family History  Problem  Relation Age of Onset  . Hypertension Father   . Heart attack Sister 20  . Heart attack Brother 49  . Dementia Mother   . Lupus Brother   . Heart attack Brother   . Hypertension Brother   . Anesthesia problems Neg Hx   . Hypotension Neg Hx   . Malignant hyperthermia Neg Hx   . Pseudochol deficiency Neg Hx     ALLERGIES:  has No Known Allergies.  MEDICATIONS:  Current Outpatient Medications  Medication Sig Dispense Refill  . acetaminophen (TYLENOL) 500 MG tablet Take 500 mg by mouth 2 (two) times daily as needed for moderate pain or headache.     . allopurinol (ZYLOPRIM) 100 MG tablet Take 100 mg by mouth at bedtime.     [name] amLODipine (NORVASC) 10 MG tablet TAKE 1 TABLET EVERY DAY  *DOSE  INCREASE* (Patient taking differently: Take 10 mg by mouth daily. ) 90 tablet 3  . aspirin 81 MG tablet Take 81 mg by mouth at bedtime.     . chlorthalidone (HYGROTON) 25 MG tablet Take 0.5 tablets (12.5 mg total) by mouth daily. 45 tablet 3  . Cholecalciferol (VITAMIN D) 50 MCG (2000 UT) tablet Take 2,000 Units by mouth daily.    . colchicine 0.6 MG tablet Take one tablet PO and may repeat in one hour if no improvement. Then take  one tablet every 3 hours until pain is relieved. (Patient taking differently: Take 0.6 mg by mouth daily as needed (gout flare). ) 30 tablet 0  . diclofenac sodium (VOLTAREN) 1 % GEL Apply 4 g topically 4 (four) times daily as needed (leg pain).   1  . lisinopril (PRINIVIL,ZESTRIL) 40 MG tablet Take 1 tablet (40 mg total) by mouth daily. 90 tablet 3  . metFORMIN (GLUCOPHAGE) 500 MG tablet Take 500 mg by mouth 2 (two) times daily with a meal.     . metoprolol tartrate (LOPRESSOR) 25 MG tablet TAKE 1 TABLET BY MOUTH TWICE A DAY 180 tablet 3  . nitroGLYCERIN (NITROSTAT) 0.4 MG SL tablet PLACE 1 TABLET  UNDER THE TONGUE EVERY 5 MINUTES AS NEEDED FOR CHEST PAIN. (Patient taking differently: Place 0.4 mg under the tongue every 5 (five) minutes as needed for chest pain. ) 25 tablet 3  . Omega-3 Fatty Acids (OMEGA-3 FISH OIL) 300 MG CAPS Take 300 mg by mouth daily.    . pravastatin (PRAVACHOL) 40 MG tablet Take 40 mg by mouth at bedtime.     [name] tiZANidine (ZANAFLEX) 4 MG tablet Take 4 mg by mouth at bedtime as needed for muscle spasms.  0  . vitamin B-12 (CYANOCOBALAMIN) 500 MCG tablet Take 500 mcg by mouth daily.     No current facility-administered medications for this visit.     REVIEW OF SYSTEMS:   Constitutional: Denies fevers, chills or abnormal night sweats Eyes: Denies blurriness of vision, double vision or watery eyes Ears, nose, mouth, throat, and face: Denies mucositis or sore throat Respiratory: Denies cough, dyspnea or wheezes Cardiovascular: Denies palpitation, chest discomfort or lower extremity swelling Gastrointestinal:  Denies nausea, heartburn or change in bowel habits Skin: Denies abnormal skin rashes Lymphatics: Denies new lymphadenopathy or easy bruising Neurological: Slight numbness in the right leg. Behavioral/Psych: Mood is stable, no new changes  Breast:  Denies any palpable lumps or discharge All other systems were reviewed with the patient and are negative.  PHYSICAL  EXAMINATION: ECOG PERFORMANCE STATUS: 1 - Symptomatic but completely ambulatory  Vitals:   03/12/19 1057  BP: (!) 150/65  Pulse: 84  Resp: 18  Temp: 97.9 F (36.6 C)  SpO2: 99%   Filed Weights   03/12/19 1057  Weight: 211 lb 9.6 oz (96 kg)    GENERAL:alert, no distress and comfortable SKIN: skin color, texture, turgor are normal, no rashes or significant lesions EYES: normal, conjunctiva are pink and non-injected, sclera clear OROPHARYNX:no exudate, no erythema and lips, buccal mucosa, and tongue normal  NECK: supple, thyroid normal size, non-tender, without nodularity LYMPH:  no palpable lymphadenopathy in the cervical, axillary or inguinal LUNGS: clear to auscultation and percussion with normal breathing effort HEART: regular rate & rhythm and no murmurs and no lower extremity edema ABDOMEN:abdomen soft, non-tender and normal bowel sounds Musculoskeletal:no cyanosis of digits and no clubbing  PSYCH: alert & oriented x 3 with fluent speech NEURO: no focal motor/sensory deficits BREAST: Left lumpectomy scar medial to the areola in the lower inner quadrant is well-healed.  No palpable masses.  No palpable axillary adenopathy.  LABORATORY DATA:  I have reviewed the data as listed Lab Results  Component Value Date   WBC 7.9 03/12/2019   HGB 12.1 03/12/2019   HCT 38.0 03/12/2019   MCV 93.1 03/12/2019   PLT 287 03/12/2019   Lab Results  Component Value Date   NA 139 03/12/2019   K 3.7 03/12/2019   CL 104 03/12/2019   CO2 24 03/12/2019    RADIOGRAPHIC STUDIES: I have personally reviewed the radiological reports and agreed with the findings in the report.  ASSESSMENT AND PLAN:  Malignant neoplasm of lower-inner quadrant of left breast in female, estrogen receptor positive ([place]) 1.  Stage Ia (PT1bPN0) left breast IDC: - She had a abnormal mammogram followed by a left breast biopsy on 11/04/2018 which showed small intraductal papilloma with usual ductal hyperplasia.  No  evidence of malignancy. - She was scheduled with Dr. Excell Seltzer but was canceled due to [place]. - She was seen by Dr. Donne Hazel and underwent left lumpectomy on 03/03/2019.  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  ER was 100%, PR was 0% and HER-2 was negative.  Ki-67 was 5%.  PT1BNX. - Lumpectomy scar is healing well.  No palpable lymphadenopathy. - Given the low-grade and small size of the tumor, she does not require any adjuvant chemotherapy. -I have recommended antiestrogen therapy for at least 5 years.  We will obtain a baseline bone density test. -We will check comprehensive metabolic panel today. -We will make a referral to radiation therapy.  2.  Bone mineral density: -She is on vitamin D 2000 units daily.  We will check her vitamin D level. -We will obtain a baseline DEXA scan.   All questions were answered. The patient knows to call the clinic with any problems, questions or concerns.    Derek Jack, MD 03/12/19

## 2019-03-13 ENCOUNTER — Encounter (HOSPITAL_COMMUNITY): Payer: Self-pay | Admitting: Lab

## 2019-03-13 LAB — VITAMIN D 25 HYDROXY (VIT D DEFICIENCY, FRACTURES): Vit D, 25-Hydroxy: 26.4 ng/mL — ABNORMAL LOW (ref 30.0–100.0)

## 2019-03-13 NOTE — Progress Notes (Unsigned)
Referral to [facility] .  Records faxed on 7/17

## 2019-03-16 DIAGNOSIS — Z9889 Other specified postprocedural states: Secondary | ICD-10-CM | POA: Diagnosis not present

## 2019-03-16 DIAGNOSIS — C50912 Malignant neoplasm of unspecified site of left female breast: Secondary | ICD-10-CM | POA: Diagnosis not present

## 2019-03-16 DIAGNOSIS — E119 Type 2 diabetes mellitus without complications: Secondary | ICD-10-CM | POA: Diagnosis not present

## 2019-03-16 DIAGNOSIS — I252 Old myocardial infarction: Secondary | ICD-10-CM | POA: Diagnosis not present

## 2019-03-16 DIAGNOSIS — Z170 Estrogen receptor positive status [ER+]: Secondary | ICD-10-CM | POA: Diagnosis not present

## 2019-03-16 DIAGNOSIS — C50312 Malignant neoplasm of lower-inner quadrant of left female breast: Secondary | ICD-10-CM | POA: Diagnosis not present

## 2019-03-16 DIAGNOSIS — Z951 Presence of aortocoronary bypass graft: Secondary | ICD-10-CM | POA: Diagnosis not present

## 2019-03-17 ENCOUNTER — Ambulatory Visit (HOSPITAL_COMMUNITY)
Admission: RE | Admit: 2019-03-17 | Discharge: 2019-03-17 | Disposition: A | Discharge status: Home / Self Care | Payer: Medicare HMO | Source: Ambulatory Visit | Attending: Hematology | Admitting: Hematology

## 2019-03-17 ENCOUNTER — Other Ambulatory Visit: Payer: Self-pay

## 2019-03-17 DIAGNOSIS — Z780 Asymptomatic menopausal state: Secondary | ICD-10-CM | POA: Diagnosis not present

## 2019-04-02 ENCOUNTER — Encounter (HOSPITAL_COMMUNITY): Payer: Self-pay | Admitting: Hematology

## 2019-04-02 ENCOUNTER — Other Ambulatory Visit: Payer: Self-pay

## 2019-04-02 ENCOUNTER — Inpatient Hospital Stay (HOSPITAL_COMMUNITY): Payer: Medicare HMO | Attending: Hematology | Admitting: Hematology

## 2019-04-02 VITALS — BP 129/55 | HR 74 | Temp 97.4°F | Resp 18 | Wt 212.2 lb

## 2019-04-02 DIAGNOSIS — Z170 Estrogen receptor positive status [ER+]: Secondary | ICD-10-CM | POA: Diagnosis not present

## 2019-04-02 DIAGNOSIS — C50312 Malignant neoplasm of lower-inner quadrant of left female breast: Secondary | ICD-10-CM | POA: Diagnosis not present

## 2019-04-02 DIAGNOSIS — Z7984 Long term (current) use of oral hypoglycemic drugs: Secondary | ICD-10-CM | POA: Insufficient documentation

## 2019-04-02 DIAGNOSIS — Z79899 Other long term (current) drug therapy: Secondary | ICD-10-CM | POA: Diagnosis not present

## 2019-04-02 DIAGNOSIS — I252 Old myocardial infarction: Secondary | ICD-10-CM | POA: Diagnosis not present

## 2019-04-02 DIAGNOSIS — Z79811 Long term (current) use of aromatase inhibitors: Secondary | ICD-10-CM | POA: Insufficient documentation

## 2019-04-02 DIAGNOSIS — E119 Type 2 diabetes mellitus without complications: Secondary | ICD-10-CM | POA: Insufficient documentation

## 2019-04-02 DIAGNOSIS — I10 Essential (primary) hypertension: Secondary | ICD-10-CM | POA: Insufficient documentation

## 2019-04-02 MED ORDER — ANASTROZOLE 1 MG PO TABS: 1.0000 mg | ORAL_TABLET | Freq: Every day | ORAL | 6 refills | Status: DC

## 2019-04-02 NOTE — Patient Instructions (Addendum)
[place] at [facility] Discharge Instructions  You were seen today by Dr. Delton Coombes. He went over your recent lab results. He will see you back in 3 months for labs and follow up.  Start taking 3000 units of Vit D daily, and a calcium supplement daily as well.   Thank you for choosing [place] [facility] to provide your oncology and hematology care.  To afford each patient quality time with our provider, please arrive at least 15 minutes before your scheduled appointment time.   If you have a lab appointment with the [place] please come in thru the  Main Entrance and check in at the main information desk  You need to re-schedule your appointment should you arrive 10 or more minutes late.  We strive to give you quality time with our providers, and arriving late affects you and other patients whose appointments are after yours.  Also, if you no show three or more times for appointments you may be dismissed from the clinic at the providers discretion.     Again, thank you for choosing [facility].  Our hope is that these requests will decrease the amount of time that you wait before being seen by our physicians.       _____________________________________________________________  Should you have questions after your visit to [facility], please contact our office at [phone] between the hours of 8:00 a.m. and 4:30 p.m.  Voicemails left after 4:00 p.m. will not be returned until the following business day.  For prescription refill requests, have your pharmacy contact our office and allow 72 hours.    Cancer Center Support Programs:   > Cancer Support Group  2nd Tuesday of the month 1pm-2pm, Journey Room

## 2019-04-02 NOTE — Progress Notes (Signed)
[place] [place], [place] [address]   CLINIC:  Medical Oncology/Hematology  PCP:  [name], MD [place] [id number] [phone]   REASON FOR VISIT:  Follow-up for left breast cancer.   BRIEF ONCOLOGIC HISTORY:  Oncology History  Malignant neoplasm of lower-inner quadrant of left breast in female, estrogen receptor positive ([place])  03/03/2019 Cancer Staging   Staging form: Breast, AJCC 8th Edition - Pathologic stage from 03/03/2019: Stage IA (pT1b, pN0, cM0, G1, ER+, PR-, HER2-) - Signed by Gardenia Phlegm, NP on 03/11/2019   03/11/2019 Initial Diagnosis   Malignant neoplasm of lower-inner quadrant of left breast in female, estrogen receptor positive ([place])      CANCER STAGING: Cancer Staging Malignant neoplasm of lower-inner quadrant of left breast in female, estrogen receptor positive ([place]) Staging form: Breast, AJCC 8th Edition - Pathologic stage from 03/03/2019: Stage IA (pT1b, pN0, cM0, G1, ER+, PR-, HER2-) - Signed by Gardenia Phlegm, NP on 03/11/2019    INTERVAL HISTORY:  Ms. Muralles 73 y.o. female seen for follow-up of stage I left breast cancer.  She was seen by Dr.Yanagihara.  She also underwent bone density test.  She is currently taking vitamin D 2000 units daily.  She is not taking any calcium supplements.  She has numbness in the right leg which has been stable.  Appetite is 100%.  Energy levels are 50%.  She walks with help of cane.  She is able to do all her ADLs and IADLs.  Denies any nausea, vomiting, diarrhea or constipation.  No hot flashes reported.    REVIEW OF SYSTEMS:  Review of Systems  Neurological: Positive for numbness.  All other systems reviewed and are negative.    PAST MEDICAL/SURGICAL HISTORY:  Past Medical History:  Diagnosis Date  . Aortic stenosis   . Arthritis   . AV block, 1st degree   . Back pain    occasionally with weather changes  . CAD (coronary artery disease)     Non-STEMI/[place] stenting 100% left anterior descending (2), January 2008. Residual 70% circumflex;80% right coronary artery. Mild LVD (EF 45%);improved to 55-60% by 2-D echocardiogram,January 2009.   . DM (diabetes mellitus) ([place])    takes Metformin 1045m BID  . Dyslipidemia    takes Pravastatin daily  . Early cataracts, bilateral   . Gout    takes Allopurinol daily  . Herniated disc    left  . Lumbar herniated disc   . Myocardial infarction ([facility] 2008   pt has 2 stents  . Nocturia   . Obesity   . Unspecified essential hypertension    takes Metoprolol,Lisinopril,and Amlodipine  . Urinary frequency    Past Surgical History:  Procedure Laterality Date  . ABDOMINAL HYSTERECTOMY    . BACK SURGERY  2009/2010/2011  . BREAST BIOPSY  unknown   left  . BREAST LUMPECTOMY WITH RADIOACTIVE SEED LOCALIZATION Left 03/03/2019   Procedure: LEFT BREAST LUMPECTOMY WITH RADIOACTIVE SEED LOCALIZATION;  Surgeon: WRolm Bookbinder MD;  Location: [facility]  Service: General;  Laterality: Left;  . CARDIAC CATHETERIZATION  2008   NON-STEMI/TAXUS STENTING 100% PROXIMAL LAD (X2) JANUARY 2008  . CARDIAC CATHETERIZATION  2012   occluded LAD stents, Om1 : 70%, RCA: 80%, Ef 40-45%  . CARPAL TUNNEL RELEASE     bilateral  . CATARACT EXTRACTION W/PHACO Right 10/11/2017   Procedure: CATARACT EXTRACTION PHACO AND INTRAOCULAR LENS PLACEMENT RIGHT EYE;  Surgeon: WBaruch Goldmann MD;  Location: [facility];  Service: Ophthalmology;  Laterality: Right;  CDE: 5.17  . CATARACT EXTRACTION W/PHACO Left 12/27/2017   Procedure: CATARACT EXTRACTION PHACO AND INTRAOCULAR LENS PLACEMENT (IOC);  Surgeon: Baruch Goldmann, MD;  Location: [facility];  Service: Ophthalmology;  Laterality: Left;  CDE: 2.99  . CHOLECYSTECTOMY  not sure  . COLONOSCOPY    . CORONARY ARTERY BYPASS GRAFT  09/05/2011   Procedure: CORONARY ARTERY BYPASS GRAFTING (CABG);  Surgeon: Grace Isaac, MD;  Location: [facility];  Service: Open Heart Surgery;   Laterality: N/A;  CABG times three using left internal mammary artery and left leg greater saphenous vein harvested endoscopically  . LEG TENDON SURGERY  unknown   left   . LUMBAR LAMINECTOMY/DECOMPRESSION MICRODISCECTOMY Left 06/04/2014   Procedure: LUMBAR LAMINECTOMY/DECOMPRESSION MICRODISCECTOMY LUMBAR FOUR-FIVE;  Surgeon: Elaina Hoops, MD;  Location: [facility];  Service: Neurosurgery;  Laterality: Left;  [name] MASS EXCISION Left 08/08/2018   Procedure: EXCISION 5CM LIPOMA ON BACK;  Surgeon: Virl Cagey, MD;  Location: [facility];  Service: General;  Laterality: Left;  . TONSILLECTOMY       SOCIAL HISTORY:  Social History   Socioeconomic History  . Marital status: Widowed    Spouse name: Not on file  . Number of children: 2  . Years of education: Not on file  . Highest education level: Not on file  Occupational History  . Not on file  Social Needs  . Financial resource strain: Not hard at all  . Food insecurity    Worry: Never true    Inability: Never true  . Transportation needs    Medical: No    Non-medical: No  Tobacco Use  . Smoking status: Never Smoker  . Smokeless tobacco: Never Used  Substance and Sexual Activity  . Alcohol use: No    Alcohol/week: 0.0 standard drinks  . Drug use: No  . Sexual activity: Not Currently    Birth control/protection: Surgical  Lifestyle  . Physical activity    Days per week: 0 days    Minutes per session: 0 min  . Stress: Only a little  Relationships  . Social connections    Talks on phone: More than three times a week    Gets together: More than three times a week    Attends religious service: More than 4 times per year    Active member of club or organization: Yes    Attends meetings of clubs or organizations: 1 to 4 times per year    Relationship status: Widowed  . Intimate partner violence    Fear of current or ex partner: No    Emotionally abused: No    Physically abused: No    Forced sexual activity: No  Other  Topics Concern  . Not on file  Social History Narrative  . Not on file    FAMILY HISTORY:  Family History  Problem Relation Age of Onset  . Hypertension Father   . Heart attack Sister 20  . Heart attack Brother 3  . Dementia Mother   . Lupus Brother   . Heart attack Brother   . Hypertension Brother   . Anesthesia problems Neg Hx   . Hypotension Neg Hx   . Malignant hyperthermia Neg Hx   . Pseudochol deficiency Neg Hx     CURRENT MEDICATIONS:  Outpatient Encounter Medications as of 04/02/2019  Medication Sig  . acetaminophen (TYLENOL) 500 MG tablet Take 500 mg by mouth 2 (two) times daily as  needed for moderate pain or headache.   . allopurinol (ZYLOPRIM) 100 MG tablet Take 100 mg by mouth at bedtime.   [name] amLODipine (NORVASC) 10 MG tablet TAKE 1 TABLET EVERY DAY  *DOSE  INCREASE* (Patient taking differently: Take 10 mg by mouth daily. )  . anastrozole (ARIMIDEX) 1 MG tablet Take 1 tablet (1 mg total) by mouth daily.  [name] aspirin 81 MG tablet Take 81 mg by mouth at bedtime.   . chlorthalidone (HYGROTON) 25 MG tablet Take 0.5 tablets (12.5 mg total) by mouth daily.  . Cholecalciferol (VITAMIN D) 50 MCG (2000 UT) tablet Take 2,000 Units by mouth daily.  . colchicine 0.6 MG tablet Take one tablet PO and may repeat in one hour if no improvement. Then take one tablet every 3 hours until pain is relieved. (Patient taking differently: Take 0.6 mg by mouth daily as needed (gout flare). )  . diclofenac sodium (VOLTAREN) 1 % GEL Apply 4 g topically 4 (four) times daily as needed (leg pain).   [name] lisinopril (PRINIVIL,ZESTRIL) 40 MG tablet Take 1 tablet (40 mg total) by mouth daily.  . metFORMIN (GLUCOPHAGE) 500 MG tablet Take 500 mg by mouth 2 (two) times daily with a meal.   . metoprolol tartrate (LOPRESSOR) 25 MG tablet TAKE 1 TABLET BY MOUTH TWICE A DAY  . nitroGLYCERIN (NITROSTAT) 0.4 MG SL tablet PLACE 1 TABLET  UNDER THE TONGUE EVERY 5 MINUTES AS NEEDED FOR CHEST PAIN. (Patient taking  differently: Place 0.4 mg under the tongue every 5 (five) minutes as needed for chest pain. )  . Omega-3 Fatty Acids (OMEGA-3 FISH OIL) 300 MG CAPS Take 300 mg by mouth daily.  . pravastatin (PRAVACHOL) 40 MG tablet Take 40 mg by mouth at bedtime.   [name] tiZANidine (ZANAFLEX) 4 MG tablet Take 4 mg by mouth at bedtime as needed for muscle spasms.  . vitamin B-12 (CYANOCOBALAMIN) 500 MCG tablet Take 500 mcg by mouth daily.  . [DISCONTINUED] Aspirin-Calcium Carbonate 81-777 MG TABS Take by mouth.   No facility-administered encounter medications on file as of 04/02/2019.     ALLERGIES:  No Known Allergies   PHYSICAL EXAM:  ECOG Performance status: 1  Vitals:   04/02/19 1044  BP: (!) 129/55  Pulse: 74  Resp: 18  Temp: (!) 97.4 F (36.3 C)  SpO2: 100%   Filed Weights   04/02/19 1044  Weight: 212 lb 3.2 oz (96.3 kg)    Physical Exam Vitals signs reviewed.  Constitutional:      Appearance: Normal appearance.  Cardiovascular:     Rate and Rhythm: Normal rate and regular rhythm.     Heart sounds: Normal heart sounds.  Pulmonary:     Effort: Pulmonary effort is normal.     Breath sounds: Normal breath sounds.  Abdominal:     General: There is no distension.     Palpations: Abdomen is soft. There is no mass.  Musculoskeletal:        General: No swelling.  Skin:    General: Skin is warm.  Neurological:     General: No focal deficit present.     Mental Status: She is alert and oriented to person, place, and time.  Psychiatric:        Mood and Affect: Mood normal.        Behavior: Behavior normal.      LABORATORY DATA:  I have reviewed the labs as listed.  CBC    Component Value Date/Time   WBC 7.9  03/12/2019 1202   RBC 4.08 03/12/2019 1202   HGB 12.1 03/12/2019 1202   HGB 13.0 09/19/2010 1152   HCT 38.0 03/12/2019 1202   HCT 39.3 09/19/2010 1152   PLT 287 03/12/2019 1202   PLT 276 09/19/2010 1152   MCV 93.1 03/12/2019 1202   MCV 89.2 09/19/2010 1152   MCH 29.7  03/12/2019 1202   MCHC 31.8 03/12/2019 1202   RDW 13.6 03/12/2019 1202   RDW 14.8 (H) 09/19/2010 1152   LYMPHSABS 2.2 03/12/2019 1202   LYMPHSABS 2.2 09/19/2010 1152   MONOABS 0.6 03/12/2019 1202   MONOABS 0.5 09/19/2010 1152   EOSABS 0.2 03/12/2019 1202   EOSABS 0.3 09/19/2010 1152   BASOSABS 0.1 03/12/2019 1202   BASOSABS 0.0 09/19/2010 1152   CMP Latest Ref Rng & Units 03/12/2019 03/02/2019 08/04/2018  Glucose 70 - 99 mg/dL 144(H) 133(H) 133(H)  BUN 8 - 23 mg/dL 29(H) 24(H) 13  Creatinine 0.44 - 1.00 mg/dL 1.44(H) 1.18(H) 1.07(H)  Sodium 135 - 145 mmol/L 139 141 141  Potassium 3.5 - 5.1 mmol/L 3.7 3.7 3.4(L)  Chloride 98 - 111 mmol/L 104 105 107  CO2 22 - 32 mmol/L _0 Calcium 8.9 - 10.3 mg/dL 9.1 9.3 8.8(L)  Total Protein 6.5 - 8.1 g/dL 7.0 - -  Total Bilirubin 0.3 - 1.2 mg/dL 0.9 - -  Alkaline Phos 38 - 126 U/L 43 - -  AST 15 - 41 U/L 19 - -  ALT 0 - 44 U/L 23 - -       DIAGNOSTIC IMAGING:  I have independently reviewed the scans and discussed with the patient.   I have reviewed Venita Lick LPN's note and agree with the documentation.  I personally performed a face-to-face visit, made revisions and my assessment and plan is as follows.    ASSESSMENT & PLAN:   Malignant neoplasm of lower-inner quadrant of left breast in female, estrogen receptor positive ([place]) 1.  Stage Ia (PT1BPNX) left breast IDC: - Abnormal mammogram followed by left breast biopsy on 11/04/2018 showed small intraductal papilloma with usual ductal hyperplasia, no evidence of malignancy. - Left lumpectomy by Dr. Donne Hazel on 03/03/2019, pathology showing 0.8 cm grade 1 invasive ductal carcinoma, arising in a complex sclerosing lesion, free margins.  ER-100%, PR-0%, HER-2 negative.  Ki-67 5%. - She was referred to [place].  No radiation therapy was recommended. - We talked about initiating her on aromatase inhibitor therapy for at least 5 years.  We talked about side effects including but not  limited to hot flashes, decreased bone mineral density, musculoskeletal symptoms among others.  She understands and gives [place] permission to proceed with it.  I have sent a prescription to her pharmacy. - I will see her back in 3 months to see how she is tolerating.  2.  Bone health: - DEXA scan on 03/17/2019 shows T score of -0.2 which is in the normal range. - I plan to repeat DEXA scan in 2 years. - Her vitamin D is low at 26.4.  She is taking vitamin D 2000 units daily.  I have told her to increase it to 3000 units.  We will repeat vitamin D level at next visit.  She was also told to take calcium tablet daily.  3.  Elevated creatinine: -Her creatinine today is 1.4.  I have reviewed her medications. -She is on chlorthalidone 12.5 mg and lisinopril 40 mg daily which can contribute to elevated creatinine. -I have counseled her to drink lots  of fluids and avoid NSAIDs.   Total time spent is 40 minutes with more than 50% of the time spent face-to-face discussing treatment plan, counseling and coordination of care.  Orders placed this encounter:  Orders Placed This Encounter  Procedures  . CBC with Differential/Platelet  . Comprehensive metabolic panel  . Vitamin D 25 hydroxy      Derek Jack, MD [place] [phone]

## 2019-04-02 NOTE — Assessment & Plan Note (Addendum)
1.  Stage Ia (PT1BPNX) left breast IDC: - Abnormal mammogram followed by left breast biopsy on 11/04/2018 showed small intraductal papilloma with usual ductal hyperplasia, no evidence of malignancy. - Left lumpectomy by Dr. Donne Hazel on 03/03/2019, pathology showing 0.8 cm grade 1 invasive ductal carcinoma, arising in a complex sclerosing lesion, free margins.  ER-100%, PR-0%, HER-2 negative.  Ki-67 5%. - She was referred to [facility].  No radiation therapy was recommended. - We talked about initiating her on aromatase inhibitor therapy for at least 5 years.  We talked about side effects including but not limited to hot flashes, decreased bone mineral density, musculoskeletal symptoms among others.  She understands and gives [place] permission to proceed with it.  I have sent a prescription to her pharmacy. - I will see her back in 3 months to see how she is tolerating.  2.  Bone health: - DEXA scan on 03/17/2019 shows T score of -0.2 which is in the normal range. - I plan to repeat DEXA scan in 2 years. - Her vitamin D is low at 26.4.  She is taking vitamin D 2000 units daily.  I have told her to increase it to 3000 units.  We will repeat vitamin D level at next visit.  She was also told to take calcium tablet daily.  3.  Elevated creatinine: -Her creatinine today is 1.4.  I have reviewed her medications. -She is on chlorthalidone 12.5 mg and lisinopril 40 mg daily which can contribute to elevated creatinine. -I have counseled her to drink lots of fluids and avoid NSAIDs.

## 2019-05-05 DIAGNOSIS — R69 Illness, unspecified: Secondary | ICD-10-CM | POA: Diagnosis not present

## 2019-05-12 DIAGNOSIS — H524 Presbyopia: Secondary | ICD-10-CM | POA: Diagnosis not present

## 2019-05-27 DIAGNOSIS — I10 Essential (primary) hypertension: Secondary | ICD-10-CM | POA: Diagnosis not present

## 2019-05-27 DIAGNOSIS — E785 Hyperlipidemia, unspecified: Secondary | ICD-10-CM | POA: Diagnosis not present

## 2019-06-10 DIAGNOSIS — Z951 Presence of aortocoronary bypass graft: Secondary | ICD-10-CM | POA: Diagnosis not present

## 2019-06-10 DIAGNOSIS — E119 Type 2 diabetes mellitus without complications: Secondary | ICD-10-CM | POA: Diagnosis not present

## 2019-06-10 DIAGNOSIS — C50312 Malignant neoplasm of lower-inner quadrant of left female breast: Secondary | ICD-10-CM | POA: Diagnosis not present

## 2019-06-10 DIAGNOSIS — C50912 Malignant neoplasm of unspecified site of left female breast: Secondary | ICD-10-CM | POA: Diagnosis not present

## 2019-06-10 DIAGNOSIS — Z170 Estrogen receptor positive status [ER+]: Secondary | ICD-10-CM | POA: Diagnosis not present

## 2019-06-10 DIAGNOSIS — I252 Old myocardial infarction: Secondary | ICD-10-CM | POA: Diagnosis not present

## 2019-06-10 DIAGNOSIS — Z9889 Other specified postprocedural states: Secondary | ICD-10-CM | POA: Diagnosis not present

## 2019-07-01 DIAGNOSIS — E1151 Type 2 diabetes mellitus with diabetic peripheral angiopathy without gangrene: Secondary | ICD-10-CM | POA: Diagnosis not present

## 2019-07-01 DIAGNOSIS — E782 Mixed hyperlipidemia: Secondary | ICD-10-CM | POA: Diagnosis not present

## 2019-07-01 DIAGNOSIS — N182 Chronic kidney disease, stage 2 (mild): Secondary | ICD-10-CM | POA: Diagnosis not present

## 2019-07-01 DIAGNOSIS — I10 Essential (primary) hypertension: Secondary | ICD-10-CM | POA: Diagnosis not present

## 2019-07-01 DIAGNOSIS — E1165 Type 2 diabetes mellitus with hyperglycemia: Secondary | ICD-10-CM | POA: Diagnosis not present

## 2019-07-01 DIAGNOSIS — E1122 Type 2 diabetes mellitus with diabetic chronic kidney disease: Secondary | ICD-10-CM | POA: Diagnosis not present

## 2019-07-02 ENCOUNTER — Inpatient Hospital Stay (HOSPITAL_COMMUNITY): Payer: Medicare HMO | Attending: Hematology

## 2019-07-02 ENCOUNTER — Other Ambulatory Visit: Payer: Self-pay

## 2019-07-02 DIAGNOSIS — R200 Anesthesia of skin: Secondary | ICD-10-CM | POA: Insufficient documentation

## 2019-07-02 DIAGNOSIS — E119 Type 2 diabetes mellitus without complications: Secondary | ICD-10-CM | POA: Insufficient documentation

## 2019-07-02 DIAGNOSIS — Z79811 Long term (current) use of aromatase inhibitors: Secondary | ICD-10-CM | POA: Insufficient documentation

## 2019-07-02 DIAGNOSIS — Z7982 Long term (current) use of aspirin: Secondary | ICD-10-CM | POA: Insufficient documentation

## 2019-07-02 DIAGNOSIS — Z7984 Long term (current) use of oral hypoglycemic drugs: Secondary | ICD-10-CM | POA: Diagnosis not present

## 2019-07-02 DIAGNOSIS — C50312 Malignant neoplasm of lower-inner quadrant of left female breast: Secondary | ICD-10-CM | POA: Insufficient documentation

## 2019-07-02 DIAGNOSIS — Z170 Estrogen receptor positive status [ER+]: Secondary | ICD-10-CM | POA: Insufficient documentation

## 2019-07-02 DIAGNOSIS — Z8249 Family history of ischemic heart disease and other diseases of the circulatory system: Secondary | ICD-10-CM | POA: Insufficient documentation

## 2019-07-02 DIAGNOSIS — I10 Essential (primary) hypertension: Secondary | ICD-10-CM | POA: Insufficient documentation

## 2019-07-02 LAB — COMPREHENSIVE METABOLIC PANEL
ALT: 22 U/L (ref 0–44)
AST: 25 U/L (ref 15–41)
Albumin: 4.3 g/dL (ref 3.5–5.0)
Alkaline Phosphatase: 43 U/L (ref 38–126)
Anion gap: 13 (ref 5–15)
BUN: 18 mg/dL (ref 8–23)
CO2: 23 mmol/L (ref 22–32)
Calcium: 9.3 mg/dL (ref 8.9–10.3)
Chloride: 105 mmol/L (ref 98–111)
Creatinine, Ser: 1.38 mg/dL — ABNORMAL HIGH (ref 0.44–1.00)
GFR calc Af Amer: 44 mL/min — ABNORMAL LOW (ref >60)
GFR calc non Af Amer: 38 mL/min — ABNORMAL LOW (ref >60)
Glucose, Bld: 169 mg/dL — ABNORMAL HIGH (ref 70–99)
Potassium: 3.8 mmol/L (ref 3.5–5.1)
Sodium: 141 mmol/L (ref 135–145)
Total Bilirubin: 0.7 mg/dL (ref 0.3–1.2)
Total Protein: 7.3 g/dL (ref 6.5–8.1)

## 2019-07-02 LAB — CBC WITH DIFFERENTIAL/PLATELET
Abs Immature Granulocytes: 0.02 10*3/uL (ref 0.00–0.07)
Basophils Absolute: 0 10*3/uL (ref 0.0–0.1)
Basophils Relative: 1 %
Eosinophils Absolute: 0.2 10*3/uL (ref 0.0–0.5)
Eosinophils Relative: 2 %
HCT: 39 % (ref 36.0–46.0)
Hemoglobin: 12.5 g/dL (ref 12.0–15.0)
Immature Granulocytes: 0 %
Lymphocytes Relative: 29 %
Lymphs Abs: 2.3 10*3/uL (ref 0.7–4.0)
MCH: 30 pg (ref 26.0–34.0)
MCHC: 32.1 g/dL (ref 30.0–36.0)
MCV: 93.5 fL (ref 80.0–100.0)
Monocytes Absolute: 0.5 10*3/uL (ref 0.1–1.0)
Monocytes Relative: 7 %
Neutro Abs: 4.9 10*3/uL (ref 1.7–7.7)
Neutrophils Relative %: 61 %
Platelets: 237 10*3/uL (ref 150–400)
RBC: 4.17 MIL/uL (ref 3.87–5.11)
RDW: 13.5 % (ref 11.5–15.5)
WBC: 8 10*3/uL (ref 4.0–10.5)
nRBC: 0 % (ref 0.0–0.2)

## 2019-07-02 LAB — VITAMIN D 25 HYDROXY (VIT D DEFICIENCY, FRACTURES): Vit D, 25-Hydroxy: 26.82 ng/mL — ABNORMAL LOW (ref 30–100)

## 2019-07-03 DIAGNOSIS — I2510 Atherosclerotic heart disease of native coronary artery without angina pectoris: Secondary | ICD-10-CM | POA: Diagnosis not present

## 2019-07-03 DIAGNOSIS — Z0001 Encounter for general adult medical examination with abnormal findings: Secondary | ICD-10-CM | POA: Diagnosis not present

## 2019-07-03 DIAGNOSIS — I739 Peripheral vascular disease, unspecified: Secondary | ICD-10-CM | POA: Diagnosis not present

## 2019-07-03 DIAGNOSIS — Z6837 Body mass index (BMI) 37.0-37.9, adult: Secondary | ICD-10-CM | POA: Diagnosis not present

## 2019-07-03 DIAGNOSIS — I10 Essential (primary) hypertension: Secondary | ICD-10-CM | POA: Diagnosis not present

## 2019-07-03 DIAGNOSIS — E1122 Type 2 diabetes mellitus with diabetic chronic kidney disease: Secondary | ICD-10-CM | POA: Diagnosis not present

## 2019-07-03 DIAGNOSIS — M1000 Idiopathic gout, unspecified site: Secondary | ICD-10-CM | POA: Diagnosis not present

## 2019-07-03 DIAGNOSIS — Z955 Presence of coronary angioplasty implant and graft: Secondary | ICD-10-CM | POA: Diagnosis not present

## 2019-07-09 ENCOUNTER — Ambulatory Visit (HOSPITAL_COMMUNITY): Payer: Medicare HMO | Admitting: Hematology

## 2019-07-14 ENCOUNTER — Other Ambulatory Visit: Payer: Self-pay

## 2019-07-15 ENCOUNTER — Encounter (HOSPITAL_COMMUNITY): Payer: Self-pay | Admitting: Hematology

## 2019-07-15 ENCOUNTER — Inpatient Hospital Stay (HOSPITAL_COMMUNITY): Payer: Medicare HMO | Admitting: Hematology

## 2019-07-15 VITALS — BP 171/69 | HR 86 | Temp 97.3°F | Resp 16 | Wt 217.7 lb

## 2019-07-15 DIAGNOSIS — C50312 Malignant neoplasm of lower-inner quadrant of left female breast: Secondary | ICD-10-CM

## 2019-07-15 DIAGNOSIS — Z170 Estrogen receptor positive status [ER+]: Secondary | ICD-10-CM

## 2019-07-15 NOTE — Assessment & Plan Note (Signed)
1.  Stage Ia (PT1BPNX) left breast IDC: - Abnormal mammogram followed by left breast biopsy on 11/04/2018 showed small intraductal papilloma with usual ductal hyperplasia, no evidence of malignancy. - Left lumpectomy by Dr. Donne Hazel on 03/03/2019, pathology showing 0.8 cm grade 1 invasive ductal carcinoma, arising in a complex sclerosing lesion, free margins.  ER-100%, PR-0%, HER-2 negative.  Ki-67 5%. - She was referred to [facility].  No radiation therapy was recommended. -Anastrozole was started on 04/02/2019.  She denies any hot flashes or musculoskeletal symptoms. -We will see her back in 4 months for follow-up.  We will schedule her for mammogram at next visit.  2.  Bone health: - DEXA scan on 03/17/2019 shows T score of -0.2 which is in the normal range. -Her vitamin D level is 26.4.  She is currently taking vitamin D 2000 units daily.  I have told her to increase it to 5000 units daily. -We plan to repeat DEXA scan in 2 to 3 years.  3.  Elevated creatinine: -She has CKD with creatinine 1.3-1.4. -I counseled her to drink lots of water and avoid NSAIDs.

## 2019-07-15 NOTE — Patient Instructions (Addendum)
[place] at [facility] Discharge Instructions  You were seen today by Dr. Delton Coombes. He went over your recent lab results. Continue taking your cancer pill as well as the calcium and vitamin D, increase your vitamin D to 5,000 units a day. He will see you back in 4 months for labs and follow up.   Thank you for choosing [place] at [facility] to provide your oncology and hematology care.  To afford each patient quality time with our provider, please arrive at least 15 minutes before your scheduled appointment time.   If you have a lab appointment with the [facility] please come in thru the  Main Entrance and check in at the main information desk  You need to re-schedule your appointment should you arrive 10 or more minutes late.  We strive to give you quality time with our providers, and arriving late affects you and other patients whose appointments are after yours.  Also, if you no show three or more times for appointments you may be dismissed from the clinic at the providers discretion.     Again, thank you for choosing [facility].  Our hope is that these requests will decrease the amount of time that you wait before being seen by our physicians.       _____________________________________________________________  Should you have questions after your visit to [facility], please contact our office at [phone] between the hours of 8:00 a.m. and 4:30 p.m.  Voicemails left after 4:00 p.m. will not be returned until the following business day.  For prescription refill requests, have your pharmacy contact our office and allow 72 hours.    Cancer Center Support Programs:   > Cancer Support Group  2nd Tuesday of the month 1pm-2pm, Journey Room

## 2019-07-15 NOTE — Progress Notes (Signed)
[name], [place] [address]   CLINIC:  Medical Oncology/Hematology  PCP:  [name], MD [place] [phone]   REASON FOR VISIT:  Follow-up for left breast cancer.   BRIEF ONCOLOGIC HISTORY:  Oncology History  Malignant neoplasm of lower-inner quadrant of left breast in female, estrogen receptor positive ([place])  03/03/2019 Cancer Staging   Staging form: Breast, AJCC 8th Edition - Pathologic stage from 03/03/2019: Stage IA (pT1b, pN0, cM0, G1, ER+, PR-, HER2-) - Signed by Gardenia Phlegm, NP on 03/11/2019   03/11/2019 Initial Diagnosis   Malignant neoplasm of lower-inner quadrant of left breast in female, estrogen receptor positive ([place])      CANCER STAGING: Cancer Staging Malignant neoplasm of lower-inner quadrant of left breast in female, estrogen receptor positive ([place]) Staging form: Breast, AJCC 8th Edition - Pathologic stage from 03/03/2019: Stage IA (pT1b, pN0, cM0, G1, ER+, PR-, HER2-) - Signed by Gardenia Phlegm, NP on 03/11/2019    INTERVAL HISTORY:  Ms. Bedwell 73 y.o. female seen for follow-up of stage I left breast cancer.  She has started taking anastrozole at last visit and is tolerating it very well.  Denies any hot flashes or musculoskeletal symptoms.  Numbness in the right leg has been stable.  Appetite is 100%.  Energy levels are 75%.  No new onset pains reported.  She is taking calcium and vitamin D.  She takes vitamin D 2000 units daily.    REVIEW OF SYSTEMS:  Review of Systems  Neurological: Positive for numbness.  All other systems reviewed and are negative.    PAST MEDICAL/SURGICAL HISTORY:  Past Medical History:  Diagnosis Date  . Aortic stenosis   . Arthritis   . AV block, 1st degree   . Back pain    occasionally with weather changes  . CAD (coronary artery disease)    Non-STEMI/Taxus stenting 100% left anterior descending (2), January 2008. Residual 70%  circumflex;80% right coronary artery. Mild LVD (EF 45%);improved to 55-60% by 2-D echocardiogram,January 2009.   . DM (diabetes mellitus) ([place])    takes Metformin 1052m BID  . Dyslipidemia    takes Pravastatin daily  . Early cataracts, bilateral   . Gout    takes Allopurinol daily  . Herniated disc    left  . Lumbar herniated disc   . Myocardial infarction ([facility] 2008   pt has 2 stents  . Nocturia   . Obesity   . Unspecified essential hypertension    takes Metoprolol,Lisinopril,and Amlodipine  . Urinary frequency    Past Surgical History:  Procedure Laterality Date  . ABDOMINAL HYSTERECTOMY    . BACK SURGERY  2009/2010/2011  . BREAST BIOPSY  unknown   left  . BREAST LUMPECTOMY WITH RADIOACTIVE SEED LOCALIZATION Left 03/03/2019   Procedure: LEFT BREAST LUMPECTOMY WITH RADIOACTIVE SEED LOCALIZATION;  Surgeon: WRolm Bookbinder MD;  Location: [facility]  Service: General;  Laterality: Left;  . CARDIAC CATHETERIZATION  2008   NON-STEMI/TAXUS STENTING 100% PROXIMAL LAD (X2) JANUARY 2008  . CARDIAC CATHETERIZATION  2012   occluded LAD stents, Om1 : 70%, RCA: 80%, Ef 40-45%  . CARPAL TUNNEL RELEASE     bilateral  . CATARACT EXTRACTION W/PHACO Right 10/11/2017   Procedure: CATARACT EXTRACTION PHACO AND INTRAOCULAR LENS PLACEMENT RIGHT EYE;  Surgeon: WBaruch Goldmann MD;  [facility];  Service: Ophthalmology;  Laterality: Right;  CDE: 5.17  . CATARACT EXTRACTION W/PHACO Left 12/27/2017  Procedure: CATARACT EXTRACTION PHACO AND INTRAOCULAR LENS PLACEMENT (IOC);  Surgeon: Baruch Goldmann, MD;  Location: [facility];  Service: Ophthalmology;  Laterality: Left;  CDE: 2.99  . CHOLECYSTECTOMY  not sure  . COLONOSCOPY    . CORONARY ARTERY BYPASS GRAFT  09/05/2011   Procedure: CORONARY ARTERY BYPASS GRAFTING (CABG);  Surgeon: Grace Isaac, MD;  Location: [facility];  Service: Open Heart Surgery;  Laterality: N/A;  CABG times three using left internal mammary artery and left leg  greater saphenous vein harvested endoscopically  . LEG TENDON SURGERY  unknown   left   . LUMBAR LAMINECTOMY/DECOMPRESSION MICRODISCECTOMY Left 06/04/2014   Procedure: LUMBAR LAMINECTOMY/DECOMPRESSION MICRODISCECTOMY LUMBAR FOUR-FIVE;  Surgeon: Elaina Hoops, MD;  Location: [facility];  Service: Neurosurgery;  Laterality: Left;  [name] MASS EXCISION Left 08/08/2018   Procedure: EXCISION 5CM LIPOMA ON BACK;  Surgeon: Virl Cagey, MD;  Location: [facility];  Service: General;  Laterality: Left;  . TONSILLECTOMY       SOCIAL HISTORY:  Social History   Socioeconomic History  . Marital status: Widowed    Spouse name: Not on file  . Number of children: 2  . Years of education: Not on file  . Highest education level: Not on file  Occupational History  . Not on file  Social Needs  . Financial resource strain: Not hard at all  . Food insecurity    Worry: Never true    Inability: Never true  . Transportation needs    Medical: No    Non-medical: No  Tobacco Use  . Smoking status: Never Smoker  . Smokeless tobacco: Never Used  Substance and Sexual Activity  . Alcohol use: No    Alcohol/week: 0.0 standard drinks  . Drug use: No  . Sexual activity: Not Currently    Birth control/protection: Surgical  Lifestyle  . Physical activity    Days per week: 0 days    Minutes per session: 0 min  . Stress: Only a little  Relationships  . Social connections    Talks on phone: More than three times a week    Gets together: More than three times a week    Attends religious service: More than 4 times per year    Active member of club or organization: Yes    Attends meetings of clubs or organizations: 1 to 4 times per year    Relationship status: Widowed  . Intimate partner violence    Fear of current or ex partner: No    Emotionally abused: No    Physically abused: No    Forced sexual activity: No  Other Topics Concern  . Not on file  Social History Narrative  . Not on file    FAMILY  HISTORY:  Family History  Problem Relation Age of Onset  . Hypertension Father   . Heart attack Sister 66  . Heart attack Brother 64  . Dementia Mother   . Lupus Brother   . Heart attack Brother   . Hypertension Brother   . Anesthesia problems Neg Hx   . Hypotension Neg Hx   . Malignant hyperthermia Neg Hx   . Pseudochol deficiency Neg Hx     CURRENT MEDICATIONS:  Outpatient Encounter Medications as of 07/15/2019  Medication Sig  . allopurinol (ZYLOPRIM) 100 MG tablet Take 100 mg by mouth at bedtime.   [name] amLODipine (NORVASC) 10 MG tablet TAKE 1 TABLET EVERY DAY  *DOSE  INCREASE* (Patient taking differently: Take 10 mg by  mouth daily. )  . anastrozole (ARIMIDEX) 1 MG tablet Take 1 tablet (1 mg total) by mouth daily.  [name] aspirin 81 MG tablet Take 81 mg by mouth at bedtime.   . Cholecalciferol (VITAMIN D) 50 MCG (2000 UT) tablet Take 2,000 Units by mouth daily.  [name] lisinopril (PRINIVIL,ZESTRIL) 40 MG tablet Take 1 tablet (40 mg total) by mouth daily.  . metFORMIN (GLUCOPHAGE) 500 MG tablet Take 500 mg by mouth 2 (two) times daily with a meal.   . metoprolol tartrate (LOPRESSOR) 25 MG tablet TAKE 1 TABLET BY MOUTH TWICE A DAY  . Omega-3 Fatty Acids (OMEGA-3 FISH OIL) 300 MG CAPS Take 300 mg by mouth daily.  . pravastatin (PRAVACHOL) 40 MG tablet Take 40 mg by mouth at bedtime.   . vitamin B-12 (CYANOCOBALAMIN) 500 MCG tablet Take 500 mcg by mouth daily.  [name] acetaminophen (TYLENOL) 500 MG tablet Take 500 mg by mouth 2 (two) times daily as needed for moderate pain or headache.   . chlorthalidone (HYGROTON) 25 MG tablet Take 0.5 tablets (12.5 mg total) by mouth daily. (Patient not taking: Reported on 07/15/2019)  . colchicine 0.6 MG tablet Take one tablet PO and may repeat in one hour if no improvement. Then take one tablet every 3 hours until pain is relieved. (Patient not taking: Reported on 07/15/2019)  . diclofenac sodium (VOLTAREN) 1 % GEL Apply 4 g topically 4 (four) times daily as  needed (leg pain).   . nitroGLYCERIN (NITROSTAT) 0.4 MG SL tablet PLACE 1 TABLET  UNDER THE TONGUE EVERY 5 MINUTES AS NEEDED FOR CHEST PAIN. (Patient not taking: No sig reported)  . tiZANidine (ZANAFLEX) 4 MG tablet Take 4 mg by mouth at bedtime as needed for muscle spasms.   No facility-administered encounter medications on file as of 07/15/2019.     ALLERGIES:  No Known Allergies   PHYSICAL EXAM:  ECOG Performance status: 1  Vitals:   07/15/19 1530  BP: (!) 171/69  Pulse: 86  Resp: 16  Temp: (!) 97.3 F (36.3 C)  SpO2: 95%   Filed Weights   07/15/19 1530  Weight: 217 lb 11.2 oz (98.7 kg)    Physical Exam Vitals signs reviewed.  Constitutional:      Appearance: Normal appearance.  Cardiovascular:     Rate and Rhythm: Normal rate and regular rhythm.     Heart sounds: Normal heart sounds.  Pulmonary:     Effort: Pulmonary effort is normal.     Breath sounds: Normal breath sounds.  Abdominal:     General: There is no distension.     Palpations: Abdomen is soft. There is no mass.  Musculoskeletal:        General: No swelling.  Skin:    General: Skin is warm.  Neurological:     General: No focal deficit present.     Mental Status: She is alert and oriented to person, place, and time.  Psychiatric:        Mood and Affect: Mood normal.        Behavior: Behavior normal.      LABORATORY DATA:  I have reviewed the labs as listed.  CBC    Component Value Date/Time   WBC 8.0 07/02/2019 1108   RBC 4.17 07/02/2019 1108   HGB 12.5 07/02/2019 1108   HGB 13.0 09/19/2010 1152   HCT 39.0 07/02/2019 1108   HCT 39.3 09/19/2010 1152   PLT 237 07/02/2019 1108   PLT 276 09/19/2010 1152   MCV  93.5 07/02/2019 1108   MCV 89.2 09/19/2010 1152   MCH 30.0 07/02/2019 1108   MCHC 32.1 07/02/2019 1108   RDW 13.5 07/02/2019 1108   RDW 14.8 (H) 09/19/2010 1152   LYMPHSABS 2.3 07/02/2019 1108   LYMPHSABS 2.2 09/19/2010 1152   MONOABS 0.5 07/02/2019 1108   MONOABS 0.5  09/19/2010 1152   EOSABS 0.2 07/02/2019 1108   EOSABS 0.3 09/19/2010 1152   BASOSABS 0.0 07/02/2019 1108   BASOSABS 0.0 09/19/2010 1152   CMP Latest Ref Rng & Units 07/02/2019 03/12/2019 03/02/2019  Glucose 70 - 99 mg/dL 169(H) 144(H) 133(H)  BUN 8 - 23 mg/dL 18 29(H) 24(H)  Creatinine 0.44 - 1.00 mg/dL 1.38(H) 1.44(H) 1.18(H)  Sodium 135 - 145 mmol/L 141 139 141  Potassium 3.5 - 5.1 mmol/L 3.8 3.7 3.7  Chloride 98 - 111 mmol/L 105 104 105  CO2 22 - 32 mmol/L '23 24 25  ' Calcium 8.9 - 10.3 mg/dL 9.3 9.1 9.3  Total Protein 6.5 - 8.1 g/dL 7.3 7.0 -  Total Bilirubin 0.3 - 1.2 mg/dL 0.7 0.9 -  Alkaline Phos 38 - 126 U/L 43 43 -  AST 15 - 41 U/L 25 19 -  ALT 0 - 44 U/L 22 23 -       DIAGNOSTIC IMAGING:  I have independently reviewed the scans and discussed with the patient.   I have reviewed Venita Lick LPN's note and agree with the documentation.  I personally performed a face-to-face visit, made revisions and my assessment and plan is as follows.    ASSESSMENT & PLAN:   Malignant neoplasm of lower-inner quadrant of left breast in female, estrogen receptor positive ([place]) 1.  Stage Ia (PT1BPNX) left breast IDC: - Abnormal mammogram followed by left breast biopsy on 11/04/2018 showed small intraductal papilloma with usual ductal hyperplasia, no evidence of malignancy. - Left lumpectomy by Dr. Donne Hazel on 03/03/2019, pathology showing 0.8 cm grade 1 invasive ductal carcinoma, arising in a complex sclerosing lesion, free margins.  ER-100%, PR-0%, HER-2 negative.  Ki-67 5%. - She was referred to [place].  No radiation therapy was recommended. -Anastrozole was started on 04/02/2019.  She denies any hot flashes or musculoskeletal symptoms. -We will see her back in 4 months for follow-up.  We will schedule her for mammogram at next visit.  2.  Bone health: - DEXA scan on 03/17/2019 shows T score of -0.2 which is in the normal range. -Her vitamin D level is 26.4.  She is currently taking  vitamin D 2000 units daily.  I have told her to increase it to 5000 units daily. -We plan to repeat DEXA scan in 2 to 3 years.  3.  Elevated creatinine: -She has CKD with creatinine 1.3-1.4. -I counseled her to drink lots of water and avoid NSAIDs.    Orders placed this encounter:  Orders Placed This Encounter  Procedures  . CBC with Differential/Platelet  . Comprehensive metabolic panel  . Vitamin D 25 hydroxy      Derek Jack, MD [place] [phone]

## 2019-08-10 DIAGNOSIS — H40011 Open angle with borderline findings, low risk, right eye: Secondary | ICD-10-CM | POA: Diagnosis not present

## 2019-08-10 DIAGNOSIS — H11153 Pinguecula, bilateral: Secondary | ICD-10-CM | POA: Diagnosis not present

## 2019-08-10 DIAGNOSIS — H11133 Conjunctival pigmentations, bilateral: Secondary | ICD-10-CM | POA: Diagnosis not present

## 2019-08-10 DIAGNOSIS — Z961 Presence of intraocular lens: Secondary | ICD-10-CM | POA: Diagnosis not present

## 2019-08-10 DIAGNOSIS — H18413 Arcus senilis, bilateral: Secondary | ICD-10-CM | POA: Diagnosis not present

## 2019-08-10 DIAGNOSIS — H40012 Open angle with borderline findings, low risk, left eye: Secondary | ICD-10-CM | POA: Diagnosis not present

## 2019-08-10 DIAGNOSIS — H26493 Other secondary cataract, bilateral: Secondary | ICD-10-CM | POA: Diagnosis not present

## 2019-09-12 DIAGNOSIS — I110 Hypertensive heart disease with heart failure: Secondary | ICD-10-CM | POA: Diagnosis not present

## 2019-09-12 DIAGNOSIS — C50919 Malignant neoplasm of unspecified site of unspecified female breast: Secondary | ICD-10-CM | POA: Diagnosis not present

## 2019-09-12 DIAGNOSIS — K219 Gastro-esophageal reflux disease without esophagitis: Secondary | ICD-10-CM | POA: Diagnosis not present

## 2019-09-12 DIAGNOSIS — I509 Heart failure, unspecified: Secondary | ICD-10-CM | POA: Diagnosis not present

## 2019-09-12 DIAGNOSIS — G8929 Other chronic pain: Secondary | ICD-10-CM | POA: Diagnosis not present

## 2019-09-12 DIAGNOSIS — E1151 Type 2 diabetes mellitus with diabetic peripheral angiopathy without gangrene: Secondary | ICD-10-CM | POA: Diagnosis not present

## 2019-09-12 DIAGNOSIS — I252 Old myocardial infarction: Secondary | ICD-10-CM | POA: Diagnosis not present

## 2019-09-12 DIAGNOSIS — E11630 Type 2 diabetes mellitus with periodontal disease: Secondary | ICD-10-CM | POA: Diagnosis not present

## 2019-09-12 DIAGNOSIS — I25119 Atherosclerotic heart disease of native coronary artery with unspecified angina pectoris: Secondary | ICD-10-CM | POA: Diagnosis not present

## 2019-09-13 ENCOUNTER — Other Ambulatory Visit (HOSPITAL_COMMUNITY): Payer: Self-pay | Admitting: Hematology

## 2019-09-13 DIAGNOSIS — C50312 Malignant neoplasm of lower-inner quadrant of left female breast: Secondary | ICD-10-CM

## 2019-09-24 ENCOUNTER — Telehealth: Payer: Self-pay

## 2019-09-24 NOTE — Telephone Encounter (Signed)
Called patient to offer htn clinic she said she felt her current medication could help with her blood pressure and if anything changed she would call us for help but also stated that she did need a blood pressure cuff.

## 2019-10-02 ENCOUNTER — Ambulatory Visit: Payer: Medicare HMO | Attending: Internal Medicine

## 2019-10-02 ENCOUNTER — Other Ambulatory Visit: Payer: Self-pay

## 2019-10-02 DIAGNOSIS — Z20822 Contact with and (suspected) exposure to covid-19: Secondary | ICD-10-CM

## 2019-10-04 LAB — NOVEL CORONAVIRUS, NAA: SARS-CoV-2, NAA: NOT DETECTED

## 2019-10-05 ENCOUNTER — Telehealth: Payer: Self-pay | Admitting: *Deleted

## 2019-10-05 NOTE — Telephone Encounter (Signed)
Pt given result of COVID test; she verbalized understanding. 

## 2019-10-06 DIAGNOSIS — Z170 Estrogen receptor positive status [ER+]: Secondary | ICD-10-CM | POA: Diagnosis not present

## 2019-10-06 DIAGNOSIS — C50912 Malignant neoplasm of unspecified site of left female breast: Secondary | ICD-10-CM | POA: Diagnosis not present

## 2019-10-06 DIAGNOSIS — I2510 Atherosclerotic heart disease of native coronary artery without angina pectoris: Secondary | ICD-10-CM | POA: Diagnosis not present

## 2019-10-06 DIAGNOSIS — I10 Essential (primary) hypertension: Secondary | ICD-10-CM | POA: Diagnosis not present

## 2019-10-06 DIAGNOSIS — E782 Mixed hyperlipidemia: Secondary | ICD-10-CM | POA: Diagnosis not present

## 2019-10-06 DIAGNOSIS — E1151 Type 2 diabetes mellitus with diabetic peripheral angiopathy without gangrene: Secondary | ICD-10-CM | POA: Diagnosis not present

## 2019-10-06 DIAGNOSIS — E1122 Type 2 diabetes mellitus with diabetic chronic kidney disease: Secondary | ICD-10-CM | POA: Diagnosis not present

## 2019-10-06 DIAGNOSIS — Z955 Presence of coronary angioplasty implant and graft: Secondary | ICD-10-CM | POA: Diagnosis not present

## 2019-10-06 DIAGNOSIS — M1000 Idiopathic gout, unspecified site: Secondary | ICD-10-CM | POA: Diagnosis not present

## 2019-10-06 DIAGNOSIS — Z6838 Body mass index (BMI) 38.0-38.9, adult: Secondary | ICD-10-CM | POA: Diagnosis not present

## 2019-10-06 DIAGNOSIS — I739 Peripheral vascular disease, unspecified: Secondary | ICD-10-CM | POA: Diagnosis not present

## 2019-10-06 DIAGNOSIS — N182 Chronic kidney disease, stage 2 (mild): Secondary | ICD-10-CM | POA: Diagnosis not present

## 2019-10-07 ENCOUNTER — Telehealth (HOSPITAL_COMMUNITY): Payer: Self-pay | Admitting: *Deleted

## 2019-10-07 ENCOUNTER — Other Ambulatory Visit (HOSPITAL_COMMUNITY): Payer: Self-pay | Admitting: Nurse Practitioner

## 2019-10-07 NOTE — Telephone Encounter (Signed)
Pt called into clinic c/o breast pain. Pt stated pain comes and goes on the breast pt had surgery on. Pt also wanted to know if she should still get her COVID vaccine scheduled for tomorrow. I spoke with Francene Finders, NP and she stated pt was having nerve pain and it'll get better with time.Provider also stated pt was okay to get COVID vaccine. Called pt back to let her know and pt verbalized understanding.

## 2019-10-08 ENCOUNTER — Other Ambulatory Visit: Payer: Self-pay

## 2019-10-08 ENCOUNTER — Ambulatory Visit: Payer: Medicare HMO | Attending: Internal Medicine

## 2019-10-08 DIAGNOSIS — Z23 Encounter for immunization: Secondary | ICD-10-CM | POA: Insufficient documentation

## 2019-10-08 NOTE — Progress Notes (Signed)
   Covid-19 Vaccination Clinic  Name:  [name]    MRN: [id number] DOB: 1946-02-10  10/08/2019  [name] was observed post Covid-19 immunization for 15 minutes without incidence. She was provided with Vaccine Information Sheet and instruction to access the V-Safe system.   [name] was instructed to call 911 with any severe reactions post vaccine: [name] Difficulty breathing  . Swelling of your face and throat  . A fast heartbeat  . A bad rash all over your body  . Dizziness and weakness    Immunizations Administered    Name Date Dose VIS Date Route   Moderna COVID-19 Vaccine 10/08/2019 11:37 AM 0.5 mL 07/28/2019 Intramuscular   Manufacturer: Moderna   Lot: [id number]   [id number]

## 2019-10-25 DIAGNOSIS — Z20828 Contact with and (suspected) exposure to other viral communicable diseases: Secondary | ICD-10-CM | POA: Diagnosis not present

## 2019-11-04 ENCOUNTER — Telehealth (INDEPENDENT_AMBULATORY_CARE_PROVIDER_SITE_OTHER): Payer: Medicare HMO | Admitting: Cardiology

## 2019-11-04 ENCOUNTER — Encounter: Payer: Self-pay | Admitting: Cardiology

## 2019-11-04 ENCOUNTER — Telehealth: Payer: Self-pay | Admitting: Licensed Clinical Social Worker

## 2019-11-04 VITALS — Ht 62.0 in | Wt 215.0 lb

## 2019-11-04 DIAGNOSIS — I10 Essential (primary) hypertension: Secondary | ICD-10-CM

## 2019-11-04 DIAGNOSIS — I2510 Atherosclerotic heart disease of native coronary artery without angina pectoris: Secondary | ICD-10-CM | POA: Diagnosis not present

## 2019-11-04 DIAGNOSIS — E782 Mixed hyperlipidemia: Secondary | ICD-10-CM

## 2019-11-04 NOTE — Telephone Encounter (Signed)
CSW referred to assist patient with obtaining a BP cuff. CSW contacted patient to inform cuff will be delivered to home. Patient grateful for support and assistance. CSW available as needed. Jackie Wilho Sharpley, LCSW, CCSW-MCS [phone]  

## 2019-11-04 NOTE — Patient Instructions (Signed)
Your physician recommends that you schedule a follow-up appointment in: [place]  Your physician recommends that you continue on your current medications as directed. Please refer to the Current Medication list given to you today.  NURSE VISIT TOMORROW 11/05/2019 AT [id number] AM [place] OFFICE   WE WILL HAVE BLOOD PRESSURE CUFF SENT TO YOU  Thank you for choosing [facility]!!

## 2019-11-04 NOTE — Progress Notes (Signed)
Virtual Visit via Telephone Note   This visit type was conducted due to national recommendations for restrictions regarding the COVID-19 Pandemic (e.g. social distancing) in an effort to limit this patient's exposure and mitigate transmission in our community.  Due to her co-morbid illnesses, this patient is at least at moderate risk for complications without adequate follow up.  This format is felt to be most appropriate for this patient at this time.  The patient did not have access to video technology/had technical difficulties with video requiring transitioning to audio format only (telephone).  All issues noted in this document were discussed and addressed.  No physical exam could be performed with this format.  Please refer to the patient's chart for her  consent to telehealth for [facility].   The patient was identified using 2 identifiers.  Date:  11/04/2019   ID:  [name], [name] [birth year], MRN [id number]  Patient Location: Home Provider Location: Office  PCP:  [name], MD  Cardiologist:  Carlyle Dolly, MD  Electrophysiologist:  None   Evaluation Performed:  Follow-Up Visit  Chief Complaint:  Follow up  History of Present Illness:    [name] is a 74 y.o. female seen today for follow up of the following medical problems.   1. CAD  - CABG Jan 2013: 3 vessel (LIMA to LAD, SVG to OM, SVG to acute marginal).She had prior stenting as described below  - Jan 2013 echo LVEF 55-60%    - no recent chest pain. No sob/doe - compliant with meds  2. Hyperlipidemia  - on pravastatin, compliant  - she reports she was on lipitor in the past, however it was changed for unclear reasons.  - labs followed by pcp   3. HTN   - last visit started chlorthalidone 12.5mg  daily.    4. PAD - followed by vascular.   Had first covid shot, repeat shot on Monday.    The patient does not have symptoms concerning for COVID-19 infection  (fever, chills, cough, or new shortness of breath).    Past Medical History:  Diagnosis Date  . Aortic stenosis   . Arthritis   . AV block, 1st degree   . Back pain    occasionally with weather changes  . CAD (coronary artery disease)    Non-STEMI/Taxus stenting 100% left anterior descending (2), January 2008. Residual 70% circumflex;80% right coronary artery. Mild LVD (EF 45%);improved to 55-60% by 2-D echocardiogram,January 2009.   . DM (diabetes mellitus) ([place])    takes Metformin 1000mg  BID  . Dyslipidemia    takes Pravastatin daily  . Early cataracts, bilateral   . Gout    takes Allopurinol daily  . Herniated disc    left  . Lumbar herniated disc   . Myocardial infarction [facility]) 2008   pt has 2 stents  . Nocturia   . Obesity   . Unspecified essential hypertension    takes Metoprolol,Lisinopril,and Amlodipine  . Urinary frequency    Past Surgical History:  Procedure Laterality Date  . ABDOMINAL HYSTERECTOMY    . BACK SURGERY  2009/2010/2011  . BREAST BIOPSY  unknown   left  . BREAST LUMPECTOMY WITH RADIOACTIVE SEED LOCALIZATION Left 03/03/2019   Procedure: LEFT BREAST LUMPECTOMY WITH RADIOACTIVE SEED LOCALIZATION;  Surgeon: Rolm Bookbinder, MD;  Location: [facility];  Service: General;  Laterality: Left;  . CARDIAC CATHETERIZATION  2008   NON-STEMI/TAXUS STENTING 100% PROXIMAL LAD (X2) JANUARY 2008  . CARDIAC CATHETERIZATION  2012   occluded LAD stents, Om1 : 70%, RCA: 80%, Ef 40-45%  . CARPAL TUNNEL RELEASE     bilateral  . CATARACT EXTRACTION W/PHACO Right 10/11/2017   Procedure: CATARACT EXTRACTION PHACO AND INTRAOCULAR LENS PLACEMENT RIGHT EYE;  Surgeon: Baruch Goldmann, MD;  [facility];  Service: Ophthalmology;  Laterality: Right;  CDE: 5.17  . CATARACT EXTRACTION W/PHACO Left 12/27/2017   Procedure: CATARACT EXTRACTION PHACO AND INTRAOCULAR LENS PLACEMENT (IOC);  Surgeon: Baruch Goldmann, MD;  [facility];  Service: Ophthalmology;   Laterality: Left;  CDE: 2.99  . CHOLECYSTECTOMY  not sure  . COLONOSCOPY    . CORONARY ARTERY BYPASS GRAFT  09/05/2011   Procedure: CORONARY ARTERY BYPASS GRAFTING (CABG);  Surgeon: Grace Isaac, MD;  Location: [facility];  Service: Open Heart Surgery;  Laterality: N/A;  CABG times three using left internal mammary artery and left leg greater saphenous vein harvested endoscopically  . LEG TENDON SURGERY  unknown   left   . LUMBAR LAMINECTOMY/DECOMPRESSION MICRODISCECTOMY Left 06/04/2014   Procedure: LUMBAR LAMINECTOMY/DECOMPRESSION MICRODISCECTOMY LUMBAR FOUR-FIVE;  Surgeon: Elaina Hoops, MD;  Location: [facility];  Service: Neurosurgery;  Laterality: Left;  [name] MASS EXCISION Left 08/08/2018   Procedure: EXCISION 5CM LIPOMA ON BACK;  Surgeon: Virl Cagey, MD;  Location: [facility];  Service: General;  Laterality: Left;  . TONSILLECTOMY       No outpatient medications have been marked as taking for the 11/04/19 encounter (Appointment) with Arnoldo Lenis, MD.     Allergies:   Patient has no known allergies.   Social History   Tobacco Use  . Smoking status: Never Smoker  . Smokeless tobacco: Never Used  Substance Use Topics  . Alcohol use: No    Alcohol/week: 0.0 standard drinks  . Drug use: No     Family Hx: The patient's family history includes Dementia in her mother; Heart attack in her brother; Heart attack (age of onset: 35) in her brother; Heart attack (age of onset: 44) in her sister; Hypertension in her brother and father; Lupus in her brother. There is no history of Anesthesia problems, Hypotension, Malignant hyperthermia, or Pseudochol deficiency.  ROS:   Please see the history of present illness.     All other systems reviewed and are negative.   Prior CV studies:   The following studies were reviewed today:  Jan 2013 LVEF 0000000, diastolic dysfunction,   Labs/Other Tests and Data Reviewed:    EKG:  No ECG reviewed.  Recent Labs: 07/02/2019: ALT 22; BUN  18; Creatinine, Ser 1.38; Hemoglobin 12.5; Platelets 237; Potassium 3.8; Sodium 141   Recent Lipid Panel No results found for: CHOL, TRIG, HDL, CHOLHDL, LDLCALC, LDLDIRECT  Wt Readings from Last 3 Encounters:  07/15/19 217 lb 11.2 oz (98.7 kg)  04/02/19 212 lb 3.2 oz (96.3 kg)  03/12/19 211 lb 9.6 oz (96 kg)     Objective:    Vital Signs:   Today's Vitals   11/04/19 0941  Weight: 215 lb (97.5 kg)  Height: 5\' 2"  (1.575 m)   Body mass index is 39.32 kg/m. Normal affect. Normal speech pattern and tone. Comfortable, no apparent distress. No audible signs of SOB or wheezing.   ASSESSMENT & PLAN:    1. CAD  - denies any symptoms, continue current meds  2. HTN  - will have her come for bp check [facility] at [facility], continue current meds  3. Hyperlipidemia  - continue statin  COVID-19 Education: The  signs and symptoms of COVID-19 were discussed with the patient and how to seek care for testing (follow up with PCP or arrange E-visit).  The importance of social distancing was discussed today.  Time:   Today, I have spent 21 minutes with the patient with telehealth technology discussing the above problems.     Medication Adjustments/Labs and Tests Ordered: Current medicines are reviewed at length with the patient today.  Concerns regarding medicines are outlined above.   Tests Ordered: No orders of the defined types were placed in this encounter.   Medication Changes: No orders of the defined types were placed in this encounter.   Follow Up:  Either In Person or Virtual in 6 month(s)  Signed, Carlyle Dolly, MD  11/04/2019 8:25 AM    [place]

## 2019-11-05 ENCOUNTER — Inpatient Hospital Stay (HOSPITAL_COMMUNITY): Payer: Medicare HMO | Attending: Hematology

## 2019-11-05 ENCOUNTER — Other Ambulatory Visit: Payer: Self-pay

## 2019-11-05 ENCOUNTER — Telehealth: Payer: Self-pay | Admitting: Cardiology

## 2019-11-05 ENCOUNTER — Ambulatory Visit (INDEPENDENT_AMBULATORY_CARE_PROVIDER_SITE_OTHER): Payer: Medicare HMO | Admitting: *Deleted

## 2019-11-05 VITALS — BP 150/70 | HR 80 | Ht 62.0 in | Wt 215.0 lb

## 2019-11-05 DIAGNOSIS — E785 Hyperlipidemia, unspecified: Secondary | ICD-10-CM | POA: Diagnosis not present

## 2019-11-05 DIAGNOSIS — Z7984 Long term (current) use of oral hypoglycemic drugs: Secondary | ICD-10-CM | POA: Insufficient documentation

## 2019-11-05 DIAGNOSIS — Z7982 Long term (current) use of aspirin: Secondary | ICD-10-CM | POA: Diagnosis not present

## 2019-11-05 DIAGNOSIS — I252 Old myocardial infarction: Secondary | ICD-10-CM | POA: Diagnosis not present

## 2019-11-05 DIAGNOSIS — Z791 Long term (current) use of non-steroidal anti-inflammatories (NSAID): Secondary | ICD-10-CM | POA: Diagnosis not present

## 2019-11-05 DIAGNOSIS — C50312 Malignant neoplasm of lower-inner quadrant of left female breast: Secondary | ICD-10-CM | POA: Insufficient documentation

## 2019-11-05 DIAGNOSIS — I10 Essential (primary) hypertension: Secondary | ICD-10-CM | POA: Insufficient documentation

## 2019-11-05 DIAGNOSIS — E669 Obesity, unspecified: Secondary | ICD-10-CM | POA: Diagnosis not present

## 2019-11-05 DIAGNOSIS — Z170 Estrogen receptor positive status [ER+]: Secondary | ICD-10-CM | POA: Insufficient documentation

## 2019-11-05 DIAGNOSIS — I2510 Atherosclerotic heart disease of native coronary artery without angina pectoris: Secondary | ICD-10-CM | POA: Insufficient documentation

## 2019-11-05 DIAGNOSIS — Z79899 Other long term (current) drug therapy: Secondary | ICD-10-CM | POA: Insufficient documentation

## 2019-11-05 DIAGNOSIS — E119 Type 2 diabetes mellitus without complications: Secondary | ICD-10-CM | POA: Diagnosis not present

## 2019-11-05 LAB — COMPREHENSIVE METABOLIC PANEL
ALT: 28 U/L (ref 0–44)
AST: 24 U/L (ref 15–41)
Albumin: 4.4 g/dL (ref 3.5–5.0)
Alkaline Phosphatase: 46 U/L (ref 38–126)
Anion gap: 11 (ref 5–15)
BUN: 31 mg/dL — ABNORMAL HIGH (ref 8–23)
CO2: 24 mmol/L (ref 22–32)
Calcium: 9.4 mg/dL (ref 8.9–10.3)
Chloride: 103 mmol/L (ref 98–111)
Creatinine, Ser: 1.49 mg/dL — ABNORMAL HIGH (ref 0.44–1.00)
GFR calc Af Amer: 40 mL/min — ABNORMAL LOW (ref >60)
GFR calc non Af Amer: 34 mL/min — ABNORMAL LOW (ref >60)
Glucose, Bld: 140 mg/dL — ABNORMAL HIGH (ref 70–99)
Potassium: 4.5 mmol/L (ref 3.5–5.1)
Sodium: 138 mmol/L (ref 135–145)
Total Bilirubin: 0.8 mg/dL (ref 0.3–1.2)
Total Protein: 7.6 g/dL (ref 6.5–8.1)

## 2019-11-05 LAB — CBC WITH DIFFERENTIAL/PLATELET
Abs Immature Granulocytes: 0.03 10*3/uL (ref 0.00–0.07)
Basophils Absolute: 0 10*3/uL (ref 0.0–0.1)
Basophils Relative: 1 %
Eosinophils Absolute: 0.2 10*3/uL (ref 0.0–0.5)
Eosinophils Relative: 2 %
HCT: 39.5 % (ref 36.0–46.0)
Hemoglobin: 12.7 g/dL (ref 12.0–15.0)
Immature Granulocytes: 0 %
Lymphocytes Relative: 36 %
Lymphs Abs: 3.1 10*3/uL (ref 0.7–4.0)
MCH: 29.6 pg (ref 26.0–34.0)
MCHC: 32.2 g/dL (ref 30.0–36.0)
MCV: 92.1 fL (ref 80.0–100.0)
Monocytes Absolute: 0.6 10*3/uL (ref 0.1–1.0)
Monocytes Relative: 7 %
Neutro Abs: 4.6 10*3/uL (ref 1.7–7.7)
Neutrophils Relative %: 54 %
Platelets: 299 10*3/uL (ref 150–400)
RBC: 4.29 MIL/uL (ref 3.87–5.11)
RDW: 14.3 % (ref 11.5–15.5)
WBC: 8.6 10*3/uL (ref 4.0–10.5)
nRBC: 0 % (ref 0.0–0.2)

## 2019-11-05 LAB — VITAMIN D 25 HYDROXY (VIT D DEFICIENCY, FRACTURES): Vit D, 25-Hydroxy: 35.93 ng/mL (ref 30–100)

## 2019-11-05 NOTE — Telephone Encounter (Signed)
Patient here for nurse BP check Advised that its okay to have lab work in same arm as BP check Verbalized understanding

## 2019-11-05 NOTE — Patient Instructions (Signed)
Continue same plan 

## 2019-11-05 NOTE — Telephone Encounter (Signed)
Patient called stating that she is going for blood work this morning at [facility]. She has a question about having her BP checked in the same arm. She is concerned about having both in the same arm.   Please call [phone].

## 2019-11-05 NOTE — Progress Notes (Signed)
Presents to office for BP check per visit on yesterday prior to lab work. Reports taking all doses of medications as prescribed without side effects. Denies dizziness, chest pain or sob.

## 2019-11-05 NOTE — Telephone Encounter (Signed)
Left message to return call 

## 2019-11-06 ENCOUNTER — Telehealth: Payer: Self-pay | Admitting: *Deleted

## 2019-11-06 DIAGNOSIS — I10 Essential (primary) hypertension: Secondary | ICD-10-CM

## 2019-11-06 NOTE — Progress Notes (Signed)
BP's high. From labs her kidney funciton has decreased some since we started chlorthalidone. Can we d/c her chlorthalidone, start aldactone 25mg  daily. Needs BMET in 2 weeks   J [employer]

## 2019-11-06 NOTE — Telephone Encounter (Addendum)
-----   Message from Arnoldo Lenis, MD sent at 11/06/2019  4:08 PM EST -----   ----- Message ----- From: Merlene Laughter, RN Sent: 11/05/2019  11:25 AM EST To: Arnoldo Lenis, MD  Arnoldo Lenis, MD (Physician)     [x] Harl Bowie Alphonse Guild, MD   BP's high. From labs her kidney funciton has decreased some since we started chlorthalidone. Can we d/c her chlorthalidone, start aldactone 25mg  daily. Needs BMET in 2 weeks   J [employer]

## 2019-11-09 ENCOUNTER — Ambulatory Visit: Payer: Medicare HMO | Attending: Internal Medicine

## 2019-11-09 DIAGNOSIS — Z23 Encounter for immunization: Secondary | ICD-10-CM

## 2019-11-09 NOTE — Progress Notes (Signed)
   Covid-19 Vaccination Clinic  Name:  [name]    MRN: [id number] DOB: 1946-03-05  11/09/2019  [name] was observed post Covid-19 immunization for 15 minutes without incident. She was provided with Vaccine Information Sheet and instruction to access the V-Safe system.   [name] was instructed to call 911 with any severe reactions post vaccine: [name] Difficulty breathing  . Swelling of face and throat  . A fast heartbeat  . A bad rash all over body  . Dizziness and weakness   Immunizations Administered    Name Date Dose VIS Date Route   Moderna COVID-19 Vaccine 11/09/2019 10:50 AM 0.5 mL 07/28/2019 Intramuscular   Manufacturer: Moderna   Lot: [id number]   [id number]

## 2019-11-12 ENCOUNTER — Other Ambulatory Visit: Payer: Self-pay

## 2019-11-12 ENCOUNTER — Other Ambulatory Visit (HOSPITAL_COMMUNITY): Payer: Self-pay | Admitting: Nurse Practitioner

## 2019-11-12 ENCOUNTER — Encounter (HOSPITAL_COMMUNITY): Payer: Self-pay | Admitting: Nurse Practitioner

## 2019-11-12 ENCOUNTER — Inpatient Hospital Stay (HOSPITAL_COMMUNITY): Payer: Medicare HMO | Admitting: Nurse Practitioner

## 2019-11-12 VITALS — BP 136/49 | HR 74 | Temp 96.9°F | Resp 17 | Wt 221.6 lb

## 2019-11-12 DIAGNOSIS — Z170 Estrogen receptor positive status [ER+]: Secondary | ICD-10-CM

## 2019-11-12 DIAGNOSIS — E669 Obesity, unspecified: Secondary | ICD-10-CM | POA: Diagnosis not present

## 2019-11-12 DIAGNOSIS — C50312 Malignant neoplasm of lower-inner quadrant of left female breast: Secondary | ICD-10-CM

## 2019-11-12 DIAGNOSIS — E785 Hyperlipidemia, unspecified: Secondary | ICD-10-CM | POA: Diagnosis not present

## 2019-11-12 DIAGNOSIS — Z7982 Long term (current) use of aspirin: Secondary | ICD-10-CM | POA: Diagnosis not present

## 2019-11-12 DIAGNOSIS — E119 Type 2 diabetes mellitus without complications: Secondary | ICD-10-CM | POA: Diagnosis not present

## 2019-11-12 DIAGNOSIS — I252 Old myocardial infarction: Secondary | ICD-10-CM | POA: Diagnosis not present

## 2019-11-12 DIAGNOSIS — I2510 Atherosclerotic heart disease of native coronary artery without angina pectoris: Secondary | ICD-10-CM | POA: Diagnosis not present

## 2019-11-12 DIAGNOSIS — Z791 Long term (current) use of non-steroidal anti-inflammatories (NSAID): Secondary | ICD-10-CM | POA: Diagnosis not present

## 2019-11-12 DIAGNOSIS — I10 Essential (primary) hypertension: Secondary | ICD-10-CM | POA: Diagnosis not present

## 2019-11-12 MED ORDER — ANASTROZOLE 1 MG PO TABS: 1.0000 mg | ORAL_TABLET | Freq: Every day | ORAL | 3 refills | Status: DC

## 2019-11-12 NOTE — Progress Notes (Signed)
[name], [place] [address]   CLINIC:  Medical Oncology/Hematology  PCP:  [name], MD [place] [id number] [phone]   REASON FOR VISIT: Follow-up for breast cancer  CURRENT THERAPY: Anastrozole  BRIEF ONCOLOGIC HISTORY:  Oncology History  Malignant neoplasm of lower-inner quadrant of left breast in female, estrogen receptor positive ([place])  03/03/2019 Cancer Staging   Staging form: Breast, AJCC 8th Edition - Pathologic stage from 03/03/2019: Stage IA (pT1b, pN0, cM0, G1, ER+, PR-, HER2-) - Signed by Gardenia Phlegm, NP on 03/11/2019   03/11/2019 Initial Diagnosis   Malignant neoplasm of lower-inner quadrant of left breast in female, estrogen receptor positive ([place])      CANCER STAGING: Cancer Staging Malignant neoplasm of lower-inner quadrant of left breast in female, estrogen receptor positive ([place]) Staging form: Breast, AJCC 8th Edition - Pathologic stage from 03/03/2019: Stage IA (pT1b, pN0, cM0, G1, ER+, PR-, HER2-) - Signed by Gardenia Phlegm, NP on 03/11/2019    INTERVAL HISTORY:  [name] 74 y.o. female returns for routine follow-up for breast cancer.  Patient reports she is taking her medication as prescribed.  She denies any side effects from her medication.  She denies any new lumps or bumps present.  She denies any new bone pain. Denies any nausea, vomiting, or diarrhea. Denies any new pains. Had not noticed any recent bleeding such as epistaxis, hematuria or hematochezia. Denies recent chest pain on exertion, shortness of breath on minimal exertion, pre-syncopal episodes, or palpitations. Denies any numbness or tingling in hands or feet. Denies any recent fevers, infections, or recent hospitalizations. Patient reports appetite at 100% and energy level at 50%.  She is eating well maintain her weight at this time.     REVIEW OF SYSTEMS:  Review of Systems  Neurological: Positive for  numbness.  All other systems reviewed and are negative.    PAST MEDICAL/SURGICAL HISTORY:  Past Medical History:  Diagnosis Date  . Aortic stenosis   . Arthritis   . AV block, 1st degree   . Back pain    occasionally with weather changes  . CAD (coronary artery disease)    Non-STEMI/Taxus stenting 100% left anterior descending (2), January 2008. Residual 70% circumflex;80% right coronary artery. Mild LVD (EF 45%);improved to 55-60% by 2-D echocardiogram,January 2009.   . DM (diabetes mellitus) ([place])    takes Metformin 1029m BID  . Dyslipidemia    takes Pravastatin daily  . Early cataracts, bilateral   . Gout    takes Allopurinol daily  . Herniated disc    left  . Lumbar herniated disc   . Myocardial infarction ([facility] 2008   pt has 2 stents  . Nocturia   . Obesity   . Unspecified essential hypertension    takes Metoprolol,Lisinopril,and Amlodipine  . Urinary frequency    Past Surgical History:  Procedure Laterality Date  . ABDOMINAL HYSTERECTOMY    . BACK SURGERY  2009/2010/2011  . BREAST BIOPSY  unknown   left  . BREAST LUMPECTOMY WITH RADIOACTIVE SEED LOCALIZATION Left 03/03/2019   Procedure: LEFT BREAST LUMPECTOMY WITH RADIOACTIVE SEED LOCALIZATION;  Surgeon: WRolm Bookbinder MD;  Location: [facility]  Service: General;  Laterality: Left;  . CARDIAC CATHETERIZATION  2008   NON-STEMI/TAXUS STENTING 100% PROXIMAL LAD (X2) JANUARY 2008  . CARDIAC CATHETERIZATION  2012   occluded LAD stents, Om1 : 70%, RCA: 80%, Ef 40-45%  . CARPAL TUNNEL RELEASE  bilateral  . CATARACT EXTRACTION W/PHACO Right 10/11/2017   Procedure: CATARACT EXTRACTION PHACO AND INTRAOCULAR LENS PLACEMENT RIGHT EYE;  Surgeon: Baruch Goldmann, MD;  Location: [facility];  Service: Ophthalmology;  Laterality: Right;  CDE: 5.17  . CATARACT EXTRACTION W/PHACO Left 12/27/2017   Procedure: CATARACT EXTRACTION PHACO AND INTRAOCULAR LENS PLACEMENT (IOC);  Surgeon: Baruch Goldmann, MD;  Location: [facility];  Service: Ophthalmology;  Laterality: Left;  CDE: 2.99  . CHOLECYSTECTOMY  not sure  . COLONOSCOPY    . CORONARY ARTERY BYPASS GRAFT  09/05/2011   Procedure: CORONARY ARTERY BYPASS GRAFTING (CABG);  Surgeon: Grace Isaac, MD;  Location: [facility] [place];  Service: Open Heart Surgery;  Laterality: N/A;  CABG times three using left internal mammary artery and left leg greater saphenous vein harvested endoscopically  . LEG TENDON SURGERY  unknown   left   . LUMBAR LAMINECTOMY/DECOMPRESSION MICRODISCECTOMY Left 06/04/2014   Procedure: LUMBAR LAMINECTOMY/DECOMPRESSION MICRODISCECTOMY LUMBAR FOUR-FIVE;  Surgeon: Elaina Hoops, MD;  Location: [facility];  Service: Neurosurgery;  Laterality: Left;  [name] MASS EXCISION Left 08/08/2018   Procedure: EXCISION 5CM LIPOMA ON BACK;  Surgeon: Virl Cagey, MD;  Location: [facility];  Service: General;  Laterality: Left;  . TONSILLECTOMY       SOCIAL HISTORY:  Social History   Socioeconomic History  . Marital status: Widowed    Spouse name: Not on file  . Number of children: 2  . Years of education: Not on file  . Highest education level: Not on file  Occupational History  . Not on file  Tobacco Use  . Smoking status: Never Smoker  . Smokeless tobacco: Never Used  Substance and Sexual Activity  . Alcohol use: No    Alcohol/week: 0.0 standard drinks  . Drug use: No  . Sexual activity: Not Currently    Birth control/protection: Surgical  Other Topics Concern  . Not on file  Social History Narrative  . Not on file   Social Determinants of Health   Financial Resource Strain: Low Risk   . Difficulty of Paying Living Expenses: Not hard at all  Food Insecurity: No Food Insecurity  . Worried About Charity fundraiser in the Last Year: Never true  . Ran Out of Food in the Last Year: Never true  Transportation Needs: No Transportation Needs  . Lack of Transportation (Medical): No  . Lack of Transportation (Non-Medical): No  Physical Activity:  Inactive  . Days of Exercise per Week: 0 days  . Minutes of Exercise per Session: 0 min  Stress: No Stress Concern Present  . Feeling of Stress : Only a little  Social Connections: Slightly Isolated  . Frequency of Communication with Friends and Family: More than three times a week  . Frequency of Social Gatherings with Friends and Family: More than three times a week  . Attends Religious Services: More than 4 times per year  . Active Member of Clubs or Organizations: Yes  . Attends Archivist Meetings: 1 to 4 times per year  . Marital Status: Widowed  Intimate Partner Violence: Not At Risk  . Fear of Current or Ex-Partner: No  . Emotionally Abused: No  . Physically Abused: No  . Sexually Abused: No    FAMILY HISTORY:  Family History  Problem Relation Age of Onset  . Hypertension Father   . Heart attack Sister 81  . Heart attack Brother 89  . Dementia Mother   . Lupus Brother   .  Heart attack Brother   . Hypertension Brother   . Anesthesia problems Neg Hx   . Hypotension Neg Hx   . Malignant hyperthermia Neg Hx   . Pseudochol deficiency Neg Hx     CURRENT MEDICATIONS:  Outpatient Encounter Medications as of 11/12/2019  Medication Sig  . allopurinol (ZYLOPRIM) 100 MG tablet Take 100 mg by mouth at bedtime.   [name] amLODipine (NORVASC) 10 MG tablet TAKE 1 TABLET EVERY DAY  *DOSE  INCREASE* (Patient taking differently: Take 10 mg by mouth daily. )  . anastrozole (ARIMIDEX) 1 MG tablet Take 1 tablet (1 mg total) by mouth daily.  . Ascorbic Acid (VITAMIN C PO) Take by mouth.  [name] aspirin 81 MG tablet Take 81 mg by mouth at bedtime.   . chlorthalidone (HYGROTON) 25 MG tablet Take 0.5 tablets (12.5 mg total) by mouth daily.  . Cholecalciferol (VITAMIN D) 50 MCG (2000 UT) tablet Take 2,000 Units by mouth daily.  [name] lisinopril (PRINIVIL,ZESTRIL) 40 MG tablet Take 1 tablet (40 mg total) by mouth daily.  . metFORMIN (GLUCOPHAGE) 500 MG tablet Take 500 mg by mouth 2 (two) times  daily with a meal.   . metoprolol tartrate (LOPRESSOR) 25 MG tablet TAKE 1 TABLET BY MOUTH TWICE A DAY  . pravastatin (PRAVACHOL) 40 MG tablet Take 40 mg by mouth at bedtime.   . vitamin B-12 (CYANOCOBALAMIN) 500 MCG tablet Take 500 mcg by mouth daily.  . [DISCONTINUED] anastrozole (ARIMIDEX) 1 MG tablet TAKE 1 TABLET BY MOUTH EVERY DAY  . acetaminophen (TYLENOL) 500 MG tablet Take 500 mg by mouth 2 (two) times daily as needed for moderate pain or headache.   . colchicine 0.6 MG tablet Take one tablet PO and may repeat in one hour if no improvement. Then take one tablet every 3 hours until pain is relieved. (Patient not taking: Reported on 11/12/2019)  . diclofenac sodium (VOLTAREN) 1 % GEL Apply 4 g topically 4 (four) times daily as needed (leg pain).   . nitroGLYCERIN (NITROSTAT) 0.4 MG SL tablet PLACE 1 TABLET  UNDER THE TONGUE EVERY 5 MINUTES AS NEEDED FOR CHEST PAIN. (Patient not taking: Reported on 11/12/2019)  . tiZANidine (ZANAFLEX) 4 MG tablet Take 4 mg by mouth at bedtime as needed for muscle spasms.   No facility-administered encounter medications on file as of 11/12/2019.    ALLERGIES:  No Known Allergies   PHYSICAL EXAM:  ECOG Performance status: 1  Vitals:   11/12/19 1329  BP: (!) 136/49  Pulse: 74  Resp: 17  Temp: (!) 96.9 F (36.1 C)  SpO2: 98%   Filed Weights   11/12/19 1329  Weight: 221 lb 9.6 oz (100.5 kg)    Physical Exam Constitutional:      Appearance: Normal appearance. She is normal weight.  Cardiovascular:     Rate and Rhythm: Normal rate and regular rhythm.     Heart sounds: Normal heart sounds.  Pulmonary:     Effort: Pulmonary effort is normal.     Breath sounds: Normal breath sounds.  Abdominal:     General: Bowel sounds are normal.     Palpations: Abdomen is soft.  Musculoskeletal:        General: Normal range of motion.  Skin:    General: Skin is warm.  Neurological:     Mental Status: She is alert and oriented to person, place, and  time. Mental status is at baseline.  Psychiatric:        Mood and Affect: Mood  normal.        Behavior: Behavior normal.        Thought Content: Thought content normal.        Judgment: Judgment normal.      LABORATORY DATA:  I have reviewed the labs as listed.  CBC    Component Value Date/Time   WBC 8.6 11/05/2019 1125   RBC 4.29 11/05/2019 1125   HGB 12.7 11/05/2019 1125   HGB 13.0 09/19/2010 1152   HCT 39.5 11/05/2019 1125   HCT 39.3 09/19/2010 1152   PLT 299 11/05/2019 1125   PLT 276 09/19/2010 1152   MCV 92.1 11/05/2019 1125   MCV 89.2 09/19/2010 1152   MCH 29.6 11/05/2019 1125   MCHC 32.2 11/05/2019 1125   RDW 14.3 11/05/2019 1125   RDW 14.8 (H) 09/19/2010 1152   LYMPHSABS 3.1 11/05/2019 1125   LYMPHSABS 2.2 09/19/2010 1152   MONOABS 0.6 11/05/2019 1125   MONOABS 0.5 09/19/2010 1152   EOSABS 0.2 11/05/2019 1125   EOSABS 0.3 09/19/2010 1152   BASOSABS 0.0 11/05/2019 1125   BASOSABS 0.0 09/19/2010 1152   CMP Latest Ref Rng & Units 11/05/2019 07/02/2019 03/12/2019  Glucose 70 - 99 mg/dL 140(H) 169(H) 144(H)  BUN 8 - 23 mg/dL 31(H) 18 29(H)  Creatinine 0.44 - 1.00 mg/dL 1.49(H) 1.38(H) 1.44(H)  Sodium 135 - 145 mmol/L 138 141 139  Potassium 3.5 - 5.1 mmol/L 4.5 3.8 3.7  Chloride 98 - 111 mmol/L 103 105 104  CO2 22 - 32 mmol/L '24 23 24  ' Calcium 8.9 - 10.3 mg/dL 9.4 9.3 9.1  Total Protein 6.5 - 8.1 g/dL 7.6 7.3 7.0  Total Bilirubin 0.3 - 1.2 mg/dL 0.8 0.7 0.9  Alkaline Phos 38 - 126 U/L 46 43 43  AST 15 - 41 U/L '24 25 19  ' ALT 0 - 44 U/L '28 22 23     ' I personally performed a face-to-face visit,   All questions were answered to patient's stated satisfaction. Encouraged patient to call with any new concerns or questions before his next visit to the cancer center and we can certain see him sooner, if needed.     ASSESSMENT & PLAN:   Malignant neoplasm of lower-inner quadrant of left breast in female, estrogen receptor positive ([place]) 1.  Stage Ia left breast  IDC: -Abnormal mammogram followed by left breast biopsy on 11/04/2018 showed small intraductal papilloma with usual ductal hyperplasia, no evidence of malignancy. -Left lumpectomy by Dr. Donne Hazel on 03/03/2019, pathology showing 0.8 cm grade 1 invasive ductal carcinoma, arising in a complex sclerosing lesion, free margins.  ER 100% positive, PR 0% negative, HER-2 negative.  Ki-67 5%. -She was referred to Dr. Francesca Jewett.  No radiation therapy was recommended. -Anastrozole was started on 04/02/2019.  She denies any hot flashes or musculoskeletal symptoms. -We will see her back in 4 months with repeat labs and mammogram.  2.  Bone health: -DEXA scan on 03/17/2019 showed T score of -0.2 which is normal range. -Her vitamin D level was 26.4 she reported she only took 2000 units daily.  We have increased her to 5000 units daily. -Labs done on 11/05/2019 showed her vitamin D level has increased to 35.93 -We will recheck at her next visit.   3.  Elevated creatinine: -She has CKD with a creatinine between 1.3 and 1.4. -I have counseled her to drink lots of water and avoid NSAIDs. -Labs done on 11/05/2019 showed her creatinine 1.49.      Orders placed this encounter:  Orders Placed This Encounter  Procedures  . MM DIAG BREAST TOMO BILATERAL  . Lactate dehydrogenase  . CBC with Differential/Platelet  . Comprehensive metabolic panel  . Vitamin B12  . VITAMIN D 25 Hydroxy (Vit-D Deficiency, Fractures)      Francene Finders, FNP-C [place] [phone]

## 2019-11-12 NOTE — Assessment & Plan Note (Signed)
1.  Stage Ia left breast IDC: -Abnormal mammogram followed by left breast biopsy on 11/04/2018 showed small intraductal papilloma with usual ductal hyperplasia, no evidence of malignancy. -Left lumpectomy by Dr. Donne Hazel on 03/03/2019, pathology showing 0.8 cm grade 1 invasive ductal carcinoma, arising in a complex sclerosing lesion, free margins.  ER 100% positive, PR 0% negative, HER-2 negative.  Ki-67 5%. -She was referred to Dr. Francesca Jewett.  No radiation therapy was recommended. -Anastrozole was started on 04/02/2019.  She denies any hot flashes or musculoskeletal symptoms. -We will see her back in 4 months with repeat labs and mammogram.  2.  Bone health: -DEXA scan on 03/17/2019 showed T score of -0.2 which is normal range. -Her vitamin D level was 26.4 she reported she only took 2000 units daily.  We have increased her to 5000 units daily. -Labs done on 11/05/2019 showed her vitamin D level has increased to 35.93 -We will recheck at her next visit.   3.  Elevated creatinine: -She has CKD with a creatinine between 1.3 and 1.4. -I have counseled her to drink lots of water and avoid NSAIDs. -Labs done on 11/05/2019 showed her creatinine 1.49.

## 2019-11-13 NOTE — Telephone Encounter (Signed)
Patient informed. Reports since nurse BP check, her blood pressure was checked yesterday at [place] visit and was 136/49. Patient says she is reluctant to changes at this time.

## 2019-11-20 NOTE — Telephone Encounter (Signed)
Can stay on for now, needs to make sure she stays well hydrated with water. Needs repeat BMET in 2 weeks   Zandra Abts MD

## 2019-11-23 NOTE — Addendum Note (Signed)
Addended by: Merlene Laughter on: 11/23/2019 10:30 AM   Modules accepted: Orders

## 2019-11-23 NOTE — Telephone Encounter (Signed)
Patient informed and verbalized understanding. Lab order faxed to [facility].

## 2019-12-08 ENCOUNTER — Other Ambulatory Visit: Payer: Self-pay

## 2019-12-08 ENCOUNTER — Inpatient Hospital Stay (HOSPITAL_COMMUNITY): Admit: 2019-12-08 | Payer: Medicare HMO

## 2019-12-08 ENCOUNTER — Ambulatory Visit (HOSPITAL_COMMUNITY)
Admission: RE | Admit: 2019-12-08 | Discharge: 2019-12-08 | Disposition: A | Discharge status: Home / Self Care | Payer: Medicare HMO | Source: Ambulatory Visit | Attending: Nurse Practitioner | Admitting: Nurse Practitioner

## 2019-12-08 ENCOUNTER — Other Ambulatory Visit (HOSPITAL_COMMUNITY)
Admission: RE | Admit: 2019-12-08 | Discharge: 2019-12-08 | Disposition: A | Discharge status: Home / Self Care | Payer: Medicare HMO | Source: Ambulatory Visit | Attending: Cardiology | Admitting: Cardiology

## 2019-12-08 DIAGNOSIS — Z170 Estrogen receptor positive status [ER+]: Secondary | ICD-10-CM | POA: Insufficient documentation

## 2019-12-08 DIAGNOSIS — C50312 Malignant neoplasm of lower-inner quadrant of left female breast: Secondary | ICD-10-CM

## 2019-12-08 DIAGNOSIS — R921 Mammographic calcification found on diagnostic imaging of breast: Secondary | ICD-10-CM | POA: Diagnosis not present

## 2019-12-08 LAB — BASIC METABOLIC PANEL
Anion gap: 11 (ref 5–15)
BUN: 22 mg/dL (ref 8–23)
CO2: 21 mmol/L — ABNORMAL LOW (ref 22–32)
Calcium: 9.2 mg/dL (ref 8.9–10.3)
Chloride: 104 mmol/L (ref 98–111)
Creatinine, Ser: 1.26 mg/dL — ABNORMAL HIGH (ref 0.44–1.00)
GFR calc Af Amer: 48 mL/min — ABNORMAL LOW (ref >60)
GFR calc non Af Amer: 42 mL/min — ABNORMAL LOW (ref >60)
Glucose, Bld: 123 mg/dL — ABNORMAL HIGH (ref 70–99)
Potassium: 3.8 mmol/L (ref 3.5–5.1)
Sodium: 136 mmol/L (ref 135–145)

## 2019-12-09 ENCOUNTER — Telehealth: Payer: Self-pay | Admitting: *Deleted

## 2019-12-09 DIAGNOSIS — C50312 Malignant neoplasm of lower-inner quadrant of left female breast: Secondary | ICD-10-CM | POA: Diagnosis not present

## 2019-12-09 DIAGNOSIS — Z170 Estrogen receptor positive status [ER+]: Secondary | ICD-10-CM | POA: Diagnosis not present

## 2019-12-09 NOTE — Telephone Encounter (Signed)
-----   Message from Arnoldo Lenis, MD sent at 12/09/2019  1:19 PM EDT ----- Labs look good, kidney function has improved. Verify she is still on chlorthalidone and the dosing, ok to stay on at this time since kidneys improved   Zandra Abts MD

## 2019-12-09 NOTE — Telephone Encounter (Signed)
Pt aware and verified she was taking chlorthalidone 12.5 mg daily

## 2019-12-10 DIAGNOSIS — M5126 Other intervertebral disc displacement, lumbar region: Secondary | ICD-10-CM | POA: Diagnosis not present

## 2019-12-11 ENCOUNTER — Other Ambulatory Visit: Payer: Self-pay | Admitting: Cardiology

## 2019-12-24 DIAGNOSIS — M5126 Other intervertebral disc displacement, lumbar region: Secondary | ICD-10-CM | POA: Diagnosis not present

## 2019-12-24 DIAGNOSIS — M48061 Spinal stenosis, lumbar region without neurogenic claudication: Secondary | ICD-10-CM | POA: Diagnosis not present

## 2020-01-01 DIAGNOSIS — M5416 Radiculopathy, lumbar region: Secondary | ICD-10-CM | POA: Diagnosis not present

## 2020-01-18 DIAGNOSIS — M5416 Radiculopathy, lumbar region: Secondary | ICD-10-CM | POA: Diagnosis not present

## 2020-02-22 DIAGNOSIS — M5416 Radiculopathy, lumbar region: Secondary | ICD-10-CM | POA: Diagnosis not present

## 2020-02-23 DIAGNOSIS — R69 Illness, unspecified: Secondary | ICD-10-CM | POA: Diagnosis not present

## 2020-02-29 DIAGNOSIS — I10 Essential (primary) hypertension: Secondary | ICD-10-CM | POA: Diagnosis not present

## 2020-02-29 DIAGNOSIS — E1122 Type 2 diabetes mellitus with diabetic chronic kidney disease: Secondary | ICD-10-CM | POA: Diagnosis not present

## 2020-02-29 DIAGNOSIS — N182 Chronic kidney disease, stage 2 (mild): Secondary | ICD-10-CM | POA: Diagnosis not present

## 2020-02-29 DIAGNOSIS — E1165 Type 2 diabetes mellitus with hyperglycemia: Secondary | ICD-10-CM | POA: Diagnosis not present

## 2020-02-29 DIAGNOSIS — R5383 Other fatigue: Secondary | ICD-10-CM | POA: Diagnosis not present

## 2020-03-03 DIAGNOSIS — Z955 Presence of coronary angioplasty implant and graft: Secondary | ICD-10-CM | POA: Diagnosis not present

## 2020-03-03 DIAGNOSIS — C50912 Malignant neoplasm of unspecified site of left female breast: Secondary | ICD-10-CM | POA: Diagnosis not present

## 2020-03-03 DIAGNOSIS — J019 Acute sinusitis, unspecified: Secondary | ICD-10-CM | POA: Diagnosis not present

## 2020-03-03 DIAGNOSIS — E1151 Type 2 diabetes mellitus with diabetic peripheral angiopathy without gangrene: Secondary | ICD-10-CM | POA: Diagnosis not present

## 2020-03-03 DIAGNOSIS — E1122 Type 2 diabetes mellitus with diabetic chronic kidney disease: Secondary | ICD-10-CM | POA: Diagnosis not present

## 2020-03-03 DIAGNOSIS — Z6837 Body mass index (BMI) 37.0-37.9, adult: Secondary | ICD-10-CM | POA: Diagnosis not present

## 2020-03-03 DIAGNOSIS — I2510 Atherosclerotic heart disease of native coronary artery without angina pectoris: Secondary | ICD-10-CM | POA: Diagnosis not present

## 2020-03-03 DIAGNOSIS — I10 Essential (primary) hypertension: Secondary | ICD-10-CM | POA: Diagnosis not present

## 2020-03-10 DIAGNOSIS — I10 Essential (primary) hypertension: Secondary | ICD-10-CM | POA: Diagnosis not present

## 2020-03-15 ENCOUNTER — Other Ambulatory Visit (HOSPITAL_COMMUNITY): Payer: Medicare HMO

## 2020-03-15 ENCOUNTER — Encounter (HOSPITAL_COMMUNITY): Payer: Medicare HMO

## 2020-03-15 ENCOUNTER — Inpatient Hospital Stay (HOSPITAL_COMMUNITY): Payer: Medicare HMO | Attending: Hematology

## 2020-03-23 ENCOUNTER — Ambulatory Visit (HOSPITAL_COMMUNITY): Payer: Medicare HMO | Admitting: Nurse Practitioner

## 2020-03-23 DIAGNOSIS — M5126 Other intervertebral disc displacement, lumbar region: Secondary | ICD-10-CM | POA: Diagnosis not present

## 2020-03-23 DIAGNOSIS — M5416 Radiculopathy, lumbar region: Secondary | ICD-10-CM | POA: Diagnosis not present

## 2020-03-25 DIAGNOSIS — I129 Hypertensive chronic kidney disease with stage 1 through stage 4 chronic kidney disease, or unspecified chronic kidney disease: Secondary | ICD-10-CM | POA: Diagnosis not present

## 2020-03-25 DIAGNOSIS — I6523 Occlusion and stenosis of bilateral carotid arteries: Secondary | ICD-10-CM | POA: Diagnosis not present

## 2020-03-25 DIAGNOSIS — E1122 Type 2 diabetes mellitus with diabetic chronic kidney disease: Secondary | ICD-10-CM | POA: Diagnosis not present

## 2020-03-25 DIAGNOSIS — N182 Chronic kidney disease, stage 2 (mild): Secondary | ICD-10-CM | POA: Diagnosis not present

## 2020-04-01 DIAGNOSIS — R69 Illness, unspecified: Secondary | ICD-10-CM | POA: Diagnosis not present

## 2020-04-13 DIAGNOSIS — K921 Melena: Secondary | ICD-10-CM | POA: Diagnosis not present

## 2020-04-26 DIAGNOSIS — I6523 Occlusion and stenosis of bilateral carotid arteries: Secondary | ICD-10-CM | POA: Diagnosis not present

## 2020-04-26 DIAGNOSIS — N182 Chronic kidney disease, stage 2 (mild): Secondary | ICD-10-CM | POA: Diagnosis not present

## 2020-04-26 DIAGNOSIS — I129 Hypertensive chronic kidney disease with stage 1 through stage 4 chronic kidney disease, or unspecified chronic kidney disease: Secondary | ICD-10-CM | POA: Diagnosis not present

## 2020-04-26 DIAGNOSIS — E1122 Type 2 diabetes mellitus with diabetic chronic kidney disease: Secondary | ICD-10-CM | POA: Diagnosis not present

## 2020-04-28 ENCOUNTER — Other Ambulatory Visit: Payer: Self-pay

## 2020-04-28 ENCOUNTER — Telehealth (INDEPENDENT_AMBULATORY_CARE_PROVIDER_SITE_OTHER): Payer: Medicare HMO | Admitting: Cardiology

## 2020-04-28 ENCOUNTER — Encounter: Payer: Self-pay | Admitting: Cardiology

## 2020-04-28 VITALS — BP 126/76 | HR 70 | Ht 62.0 in | Wt 213.0 lb

## 2020-04-28 DIAGNOSIS — I10 Essential (primary) hypertension: Secondary | ICD-10-CM

## 2020-04-28 DIAGNOSIS — I2510 Atherosclerotic heart disease of native coronary artery without angina pectoris: Secondary | ICD-10-CM | POA: Diagnosis not present

## 2020-04-28 DIAGNOSIS — E782 Mixed hyperlipidemia: Secondary | ICD-10-CM | POA: Diagnosis not present

## 2020-04-28 NOTE — Progress Notes (Signed)
Clinical Summary Ms. Culliver is a 74 y.o.female seen today for follow up of the following medical problems.   1. CAD  - CABG Jan 2013: 3 vessel (LIMA to LAD, SVG to OM, SVG to acute marginal).She had prior stenting as described below  - Jan 2013 echo LVEF 55-60%    - no recent chest pain, no SOB/DOE - compliant with meds    2. Hyperlipidemia  - on pravastatin, compliant  - she reports she was on lipitor in the past, however it was changed for unclear reasons.  -compliant with statin - labs followed by pcp   3. HTN  - home bp's at goal   4. PAD - followed by vascular.   Completed covid vaccine x2.  Past Medical History:  Diagnosis Date  . Aortic stenosis   . Arthritis   . AV block, 1st degree   . Back pain    occasionally with weather changes  . CAD (coronary artery disease)    Non-STEMI/Taxus stenting 100% left anterior descending (2), January 2008. Residual 70% circumflex;80% right coronary artery. Mild LVD (EF 45%);improved to 55-60% by 2-D echocardiogram,January 2009.   . DM (diabetes mellitus) ([place])    takes Metformin 1000mg  BID  . Dyslipidemia    takes Pravastatin daily  . Early cataracts, bilateral   . Gout    takes Allopurinol daily  . Herniated disc    left  . Lumbar herniated disc   . Myocardial infarction [facility]) 2008   pt has 2 stents  . Nocturia   . Obesity   . Unspecified essential hypertension    takes Metoprolol,Lisinopril,and Amlodipine  . Urinary frequency      No Known Allergies   Current Outpatient Medications  Medication Sig Dispense Refill  . acetaminophen (TYLENOL) 500 MG tablet Take 500 mg by mouth 2 (two) times daily as needed for moderate pain or headache.     . allopurinol (ZYLOPRIM) 100 MG tablet Take 100 mg by mouth at bedtime.     [name] amLODipine (NORVASC) 10 MG tablet TAKE 1 TABLET EVERY DAY  *DOSE  INCREASE* (Patient taking differently: Take 10 mg by mouth daily. ) 90 tablet 3  . anastrozole  (ARIMIDEX) 1 MG tablet Take 1 tablet (1 mg total) by mouth daily. 90 tablet 3  . Ascorbic Acid (VITAMIN C PO) Take by mouth.    [name] aspirin 81 MG tablet Take 81 mg by mouth at bedtime.     . chlorthalidone (HYGROTON) 25 MG tablet Take 0.5 tablets (12.5 mg total) by mouth daily. 45 tablet 3  . Cholecalciferol (VITAMIN D) 50 MCG (2000 UT) tablet Take 2,000 Units by mouth daily.    . colchicine 0.6 MG tablet Take one tablet PO and may repeat in one hour if no improvement. Then take one tablet every 3 hours until pain is relieved. (Patient not taking: Reported on 11/12/2019) 30 tablet 0  . diclofenac sodium (VOLTAREN) 1 % GEL Apply 4 g topically 4 (four) times daily as needed (leg pain).   1  . lisinopril (PRINIVIL,ZESTRIL) 40 MG tablet Take 1 tablet (40 mg total) by mouth daily. 90 tablet 3  . metFORMIN (GLUCOPHAGE) 500 MG tablet Take 500 mg by mouth 2 (two) times daily with a meal.     . metoprolol tartrate (LOPRESSOR) 25 MG tablet TAKE 1 TABLET BY MOUTH TWICE A DAY 180 tablet 3  . nitroGLYCERIN (NITROSTAT) 0.4 MG SL tablet PLACE 1 TABLET  UNDER THE TONGUE EVERY 5 MINUTES  AS NEEDED FOR CHEST PAIN. (Patient not taking: Reported on 11/12/2019) 25 tablet 3  . pravastatin (PRAVACHOL) 40 MG tablet Take 40 mg by mouth at bedtime.     [name] tiZANidine (ZANAFLEX) 4 MG tablet Take 4 mg by mouth at bedtime as needed for muscle spasms.  0  . vitamin B-12 (CYANOCOBALAMIN) 500 MCG tablet Take 500 mcg by mouth daily.     No current facility-administered medications for this visit.     Past Surgical History:  Procedure Laterality Date  . ABDOMINAL HYSTERECTOMY    . BACK SURGERY  2009/2010/2011  . BREAST BIOPSY  unknown   left  . BREAST LUMPECTOMY WITH RADIOACTIVE SEED LOCALIZATION Left 03/03/2019   Procedure: LEFT BREAST LUMPECTOMY WITH RADIOACTIVE SEED LOCALIZATION;  Surgeon: Rolm Bookbinder, MD;  Location: [facility];  Service: General;  Laterality: Left;  . CARDIAC CATHETERIZATION  2008    NON-STEMI/TAXUS STENTING 100% PROXIMAL LAD (X2) JANUARY 2008  . CARDIAC CATHETERIZATION  2012   occluded LAD stents, Om1 : 70%, RCA: 80%, Ef 40-45%  . CARPAL TUNNEL RELEASE     bilateral  . CATARACT EXTRACTION W/PHACO Right 10/11/2017   Procedure: CATARACT EXTRACTION PHACO AND INTRAOCULAR LENS PLACEMENT RIGHT EYE;  Surgeon: Baruch Goldmann, MD;  Location: [facility];  Service: Ophthalmology;  Laterality: Right;  CDE: 5.17  . CATARACT EXTRACTION W/PHACO Left 12/27/2017   Procedure: CATARACT EXTRACTION PHACO AND INTRAOCULAR LENS PLACEMENT (IOC);  Surgeon: Baruch Goldmann, MD;  Location: [facility];  Service: Ophthalmology;  Laterality: Left;  CDE: 2.99  . CHOLECYSTECTOMY  not sure  . COLONOSCOPY    . CORONARY ARTERY BYPASS GRAFT  09/05/2011   Procedure: CORONARY ARTERY BYPASS GRAFTING (CABG);  Surgeon: Grace Isaac, MD;  Location: [facility];  Service: Open Heart Surgery;  Laterality: N/A;  CABG times three using left internal mammary artery and left leg greater saphenous vein harvested endoscopically  . LEG TENDON SURGERY  unknown   left   . LUMBAR LAMINECTOMY/DECOMPRESSION MICRODISCECTOMY Left 06/04/2014   Procedure: LUMBAR LAMINECTOMY/DECOMPRESSION MICRODISCECTOMY LUMBAR FOUR-FIVE;  Surgeon: Elaina Hoops, MD;  Location: [place] [facility];  Service: Neurosurgery;  Laterality: Left;  [name] MASS EXCISION Left 08/08/2018   Procedure: EXCISION 5CM LIPOMA ON BACK;  Surgeon: Virl Cagey, MD;  Location: [facility];  Service: General;  Laterality: Left;  . TONSILLECTOMY       No Known Allergies    Family History  Problem Relation Age of Onset  . Hypertension Father   . Heart attack Sister 84  . Heart attack Brother 48  . Dementia Mother   . Lupus Brother   . Heart attack Brother   . Hypertension Brother   . Anesthesia problems Neg Hx   . Hypotension Neg Hx   . Malignant hyperthermia Neg Hx   . Pseudochol deficiency Neg Hx      Social History Ms. Cush reports that she has never smoked. She has never  used smokeless tobacco. Ms. Swarm reports no history of alcohol use.   Review of Systems CONSTITUTIONAL: No weight loss, fever, chills, weakness or fatigue.  HEENT: Eyes: No visual loss, blurred vision, double vision or yellow sclerae.No hearing loss, sneezing, congestion, runny nose or sore throat.  SKIN: No rash or itching.  CARDIOVASCULAR: per hpi RESPIRATORY: No shortness of breath, cough or sputum.  GASTROINTESTINAL: No anorexia, nausea, vomiting or diarrhea. No abdominal pain or blood.  GENITOURINARY: No burning on urination, no polyuria NEUROLOGICAL: No headache, dizziness, syncope, paralysis, ataxia, numbness or  tingling in the extremities. No change in bowel or bladder control.  MUSCULOSKELETAL: No muscle, back pain, joint pain or stiffness.  LYMPHATICS: No enlarged nodes. No history of splenectomy.  PSYCHIATRIC: No history of depression or anxiety.  ENDOCRINOLOGIC: No reports of sweating, cold or heat intolerance. No polyuria or polydipsia.  [name]   Physical Examination Today's Vitals   04/28/20 1411  BP: 126/76  Pulse: 70  Weight: 213 lb (96.6 kg)  Height: 5\' 2"  (1.575 m)   Body mass index is 38.96 kg/m.  Gen: resting comfortably, no acute distress HEENT: no scleral icterus, pupils equal round and reactive, no palptable cervical adenopathy,  CV: RRR, no m/r/g, no jvd Resp: Clear to auscultation bilaterally GI: abdomen is soft, non-tender, non-distended, normal bowel sounds, no hepatosplenomegaly MSK: extremities are warm, no edema.  Skin: warm, no rash Neuro:  no focal deficits Psych: appropriate affect   Diagnostic Studies     Assessment and Plan  1. CAD  -no recent symptoms, continue current meds  2. HTN  -at goal, continue current meds  3. Hyperlipidemia  - request labs from pcp, continue statin  F/u 6 months      Arnoldo Lenis, M.D.

## 2020-04-28 NOTE — Patient Instructions (Signed)
Medication Instructions:  Your physician recommends that you continue on your current medications as directed. Please refer to the Current Medication list given to you today.  *If you need a refill on your cardiac medications before your next appointment, please call your pharmacy*   Lab Work: None today If you have labs (blood work) drawn today and your tests are completely normal, you will receive your results only by: . MyChart Message (if you have MyChart) OR . A paper copy in the mail If you have any lab test that is abnormal or we need to change your treatment, we will call you to review the results.   Testing/Procedures: None today   Follow-Up: At [facility], you and your health needs are our priority.  As part of our continuing mission to provide you with exceptional heart care, we have created designated Provider Care Teams.  These Care Teams include your primary Cardiologist (physician) and Advanced Practice Providers (APPs -  Physician Assistants and Nurse Practitioners) who all work together to provide you with the care you need, when you need it.  We recommend signing up for the patient portal called "MyChart".  Sign up information is provided on this After Visit Summary.  MyChart is used to connect with patients for Virtual Visits (Telemedicine).  Patients are able to view lab/test results, encounter notes, upcoming appointments, etc.  Non-urgent messages can be sent to your provider as well.   To learn more about what you can do with MyChart, go to [email or web address].    Your next appointment:   6 month(s)  The format for your next appointment:   In Person  Provider:   Jonathan Branch, MD   Other Instructions None       Thank you for choosing [facility] !         

## 2020-05-05 DIAGNOSIS — K648 Other hemorrhoids: Secondary | ICD-10-CM | POA: Diagnosis not present

## 2020-05-05 DIAGNOSIS — D126 Benign neoplasm of colon, unspecified: Secondary | ICD-10-CM | POA: Diagnosis not present

## 2020-05-05 DIAGNOSIS — E7800 Pure hypercholesterolemia, unspecified: Secondary | ICD-10-CM | POA: Diagnosis not present

## 2020-05-05 DIAGNOSIS — K635 Polyp of colon: Secondary | ICD-10-CM | POA: Diagnosis not present

## 2020-05-05 DIAGNOSIS — I252 Old myocardial infarction: Secondary | ICD-10-CM | POA: Diagnosis not present

## 2020-05-05 DIAGNOSIS — K625 Hemorrhage of anus and rectum: Secondary | ICD-10-CM | POA: Diagnosis not present

## 2020-05-05 DIAGNOSIS — E119 Type 2 diabetes mellitus without complications: Secondary | ICD-10-CM | POA: Diagnosis not present

## 2020-05-05 DIAGNOSIS — K621 Rectal polyp: Secondary | ICD-10-CM | POA: Diagnosis not present

## 2020-05-05 DIAGNOSIS — K5730 Diverticulosis of large intestine without perforation or abscess without bleeding: Secondary | ICD-10-CM | POA: Diagnosis not present

## 2020-05-05 DIAGNOSIS — K641 Second degree hemorrhoids: Secondary | ICD-10-CM | POA: Diagnosis not present

## 2020-05-05 DIAGNOSIS — I10 Essential (primary) hypertension: Secondary | ICD-10-CM | POA: Diagnosis not present

## 2020-05-09 ENCOUNTER — Telehealth: Payer: Medicare HMO | Admitting: Cardiology

## 2020-05-09 DIAGNOSIS — I10 Essential (primary) hypertension: Secondary | ICD-10-CM | POA: Diagnosis not present

## 2020-05-11 ENCOUNTER — Telehealth: Payer: Self-pay | Admitting: Cardiology

## 2020-05-11 NOTE — Telephone Encounter (Signed)
Spoke with pt and informed that we do recommend the Covid booster. Pt will call [facility] health department.

## 2020-05-11 NOTE — Telephone Encounter (Signed)
New message    Patient would like to know if Dr Harl Bowie recommends for her to get the Covid Booster, and where can she get it at? Told patient they are doing Boosters through the Health Department by appointment, she wanted to know if we could get it and give it to her?

## 2020-05-19 DIAGNOSIS — R69 Illness, unspecified: Secondary | ICD-10-CM | POA: Diagnosis not present

## 2020-05-24 DIAGNOSIS — E782 Mixed hyperlipidemia: Secondary | ICD-10-CM | POA: Diagnosis not present

## 2020-05-24 DIAGNOSIS — E1151 Type 2 diabetes mellitus with diabetic peripheral angiopathy without gangrene: Secondary | ICD-10-CM | POA: Diagnosis not present

## 2020-05-24 DIAGNOSIS — E1165 Type 2 diabetes mellitus with hyperglycemia: Secondary | ICD-10-CM | POA: Diagnosis not present

## 2020-05-24 DIAGNOSIS — E1122 Type 2 diabetes mellitus with diabetic chronic kidney disease: Secondary | ICD-10-CM | POA: Diagnosis not present

## 2020-05-24 DIAGNOSIS — N182 Chronic kidney disease, stage 2 (mild): Secondary | ICD-10-CM | POA: Diagnosis not present

## 2020-05-24 DIAGNOSIS — R5383 Other fatigue: Secondary | ICD-10-CM | POA: Diagnosis not present

## 2020-05-24 DIAGNOSIS — I10 Essential (primary) hypertension: Secondary | ICD-10-CM | POA: Diagnosis not present

## 2020-05-26 DIAGNOSIS — N182 Chronic kidney disease, stage 2 (mild): Secondary | ICD-10-CM | POA: Diagnosis not present

## 2020-05-26 DIAGNOSIS — I6523 Occlusion and stenosis of bilateral carotid arteries: Secondary | ICD-10-CM | POA: Diagnosis not present

## 2020-05-26 DIAGNOSIS — I129 Hypertensive chronic kidney disease with stage 1 through stage 4 chronic kidney disease, or unspecified chronic kidney disease: Secondary | ICD-10-CM | POA: Diagnosis not present

## 2020-05-26 DIAGNOSIS — E1122 Type 2 diabetes mellitus with diabetic chronic kidney disease: Secondary | ICD-10-CM | POA: Diagnosis not present

## 2020-06-08 DIAGNOSIS — D126 Benign neoplasm of colon, unspecified: Secondary | ICD-10-CM | POA: Diagnosis not present

## 2020-06-08 DIAGNOSIS — I10 Essential (primary) hypertension: Secondary | ICD-10-CM | POA: Diagnosis not present

## 2020-06-08 DIAGNOSIS — Z6838 Body mass index (BMI) 38.0-38.9, adult: Secondary | ICD-10-CM | POA: Diagnosis not present

## 2020-06-08 DIAGNOSIS — E1151 Type 2 diabetes mellitus with diabetic peripheral angiopathy without gangrene: Secondary | ICD-10-CM | POA: Diagnosis not present

## 2020-06-08 DIAGNOSIS — E1122 Type 2 diabetes mellitus with diabetic chronic kidney disease: Secondary | ICD-10-CM | POA: Diagnosis not present

## 2020-06-08 DIAGNOSIS — Z955 Presence of coronary angioplasty implant and graft: Secondary | ICD-10-CM | POA: Diagnosis not present

## 2020-06-08 DIAGNOSIS — C50912 Malignant neoplasm of unspecified site of left female breast: Secondary | ICD-10-CM | POA: Diagnosis not present

## 2020-06-08 DIAGNOSIS — I2510 Atherosclerotic heart disease of native coronary artery without angina pectoris: Secondary | ICD-10-CM | POA: Diagnosis not present

## 2020-06-25 DIAGNOSIS — I129 Hypertensive chronic kidney disease with stage 1 through stage 4 chronic kidney disease, or unspecified chronic kidney disease: Secondary | ICD-10-CM | POA: Diagnosis not present

## 2020-06-25 DIAGNOSIS — E1122 Type 2 diabetes mellitus with diabetic chronic kidney disease: Secondary | ICD-10-CM | POA: Diagnosis not present

## 2020-06-25 DIAGNOSIS — I6523 Occlusion and stenosis of bilateral carotid arteries: Secondary | ICD-10-CM | POA: Diagnosis not present

## 2020-06-25 DIAGNOSIS — N182 Chronic kidney disease, stage 2 (mild): Secondary | ICD-10-CM | POA: Diagnosis not present

## 2020-07-15 ENCOUNTER — Other Ambulatory Visit (HOSPITAL_COMMUNITY): Payer: Self-pay

## 2020-07-15 DIAGNOSIS — R69 Illness, unspecified: Secondary | ICD-10-CM | POA: Diagnosis not present

## 2020-07-15 DIAGNOSIS — Z170 Estrogen receptor positive status [ER+]: Secondary | ICD-10-CM

## 2020-07-15 DIAGNOSIS — C50312 Malignant neoplasm of lower-inner quadrant of left female breast: Secondary | ICD-10-CM

## 2020-07-18 ENCOUNTER — Other Ambulatory Visit: Payer: Self-pay

## 2020-07-18 ENCOUNTER — Inpatient Hospital Stay (HOSPITAL_COMMUNITY): Payer: Medicare HMO | Attending: Hematology

## 2020-07-18 DIAGNOSIS — I2510 Atherosclerotic heart disease of native coronary artery without angina pectoris: Secondary | ICD-10-CM | POA: Diagnosis not present

## 2020-07-18 DIAGNOSIS — E1136 Type 2 diabetes mellitus with diabetic cataract: Secondary | ICD-10-CM | POA: Diagnosis not present

## 2020-07-18 DIAGNOSIS — Z791 Long term (current) use of non-steroidal anti-inflammatories (NSAID): Secondary | ICD-10-CM | POA: Diagnosis not present

## 2020-07-18 DIAGNOSIS — R7989 Other specified abnormal findings of blood chemistry: Secondary | ICD-10-CM | POA: Insufficient documentation

## 2020-07-18 DIAGNOSIS — E1122 Type 2 diabetes mellitus with diabetic chronic kidney disease: Secondary | ICD-10-CM | POA: Insufficient documentation

## 2020-07-18 DIAGNOSIS — N189 Chronic kidney disease, unspecified: Secondary | ICD-10-CM | POA: Insufficient documentation

## 2020-07-18 DIAGNOSIS — Z7984 Long term (current) use of oral hypoglycemic drugs: Secondary | ICD-10-CM | POA: Insufficient documentation

## 2020-07-18 DIAGNOSIS — Z7982 Long term (current) use of aspirin: Secondary | ICD-10-CM | POA: Insufficient documentation

## 2020-07-18 DIAGNOSIS — C50312 Malignant neoplasm of lower-inner quadrant of left female breast: Secondary | ICD-10-CM | POA: Diagnosis not present

## 2020-07-18 DIAGNOSIS — I129 Hypertensive chronic kidney disease with stage 1 through stage 4 chronic kidney disease, or unspecified chronic kidney disease: Secondary | ICD-10-CM | POA: Diagnosis not present

## 2020-07-18 DIAGNOSIS — I252 Old myocardial infarction: Secondary | ICD-10-CM | POA: Diagnosis not present

## 2020-07-18 DIAGNOSIS — E669 Obesity, unspecified: Secondary | ICD-10-CM | POA: Insufficient documentation

## 2020-07-18 DIAGNOSIS — Z79899 Other long term (current) drug therapy: Secondary | ICD-10-CM | POA: Insufficient documentation

## 2020-07-18 DIAGNOSIS — E785 Hyperlipidemia, unspecified: Secondary | ICD-10-CM | POA: Insufficient documentation

## 2020-07-18 DIAGNOSIS — Z170 Estrogen receptor positive status [ER+]: Secondary | ICD-10-CM | POA: Diagnosis present

## 2020-07-18 LAB — CBC WITH DIFFERENTIAL/PLATELET
Abs Immature Granulocytes: 0.03 10*3/uL (ref 0.00–0.07)
Basophils Absolute: 0.1 10*3/uL (ref 0.0–0.1)
Basophils Relative: 1 %
Eosinophils Absolute: 0.2 10*3/uL (ref 0.0–0.5)
Eosinophils Relative: 3 %
HCT: 37.3 % (ref 36.0–46.0)
Hemoglobin: 12.1 g/dL (ref 12.0–15.0)
Immature Granulocytes: 0 %
Lymphocytes Relative: 31 %
Lymphs Abs: 2.4 10*3/uL (ref 0.7–4.0)
MCH: 29.7 pg (ref 26.0–34.0)
MCHC: 32.4 g/dL (ref 30.0–36.0)
MCV: 91.4 fL (ref 80.0–100.0)
Monocytes Absolute: 0.7 10*3/uL (ref 0.1–1.0)
Monocytes Relative: 9 %
Neutro Abs: 4.4 10*3/uL (ref 1.7–7.7)
Neutrophils Relative %: 56 %
Platelets: 277 10*3/uL (ref 150–400)
RBC: 4.08 MIL/uL (ref 3.87–5.11)
RDW: 14.2 % (ref 11.5–15.5)
WBC: 7.7 10*3/uL (ref 4.0–10.5)
nRBC: 0 % (ref 0.0–0.2)

## 2020-07-18 LAB — COMPREHENSIVE METABOLIC PANEL
ALT: 26 U/L (ref 0–44)
AST: 23 U/L (ref 15–41)
Albumin: 4.1 g/dL (ref 3.5–5.0)
Alkaline Phosphatase: 46 U/L (ref 38–126)
Anion gap: 12 (ref 5–15)
BUN: 25 mg/dL — ABNORMAL HIGH (ref 8–23)
CO2: 23 mmol/L (ref 22–32)
Calcium: 9.4 mg/dL (ref 8.9–10.3)
Chloride: 105 mmol/L (ref 98–111)
Creatinine, Ser: 1.47 mg/dL — ABNORMAL HIGH (ref 0.44–1.00)
GFR, Estimated: 37 mL/min — ABNORMAL LOW (ref >60)
Glucose, Bld: 152 mg/dL — ABNORMAL HIGH (ref 70–99)
Potassium: 4.1 mmol/L (ref 3.5–5.1)
Sodium: 140 mmol/L (ref 135–145)
Total Bilirubin: 0.5 mg/dL (ref 0.3–1.2)
Total Protein: 7.3 g/dL (ref 6.5–8.1)

## 2020-07-18 LAB — VITAMIN D 25 HYDROXY (VIT D DEFICIENCY, FRACTURES): Vit D, 25-Hydroxy: 35.51 ng/mL (ref 30–100)

## 2020-07-25 ENCOUNTER — Other Ambulatory Visit: Payer: Self-pay

## 2020-07-25 ENCOUNTER — Inpatient Hospital Stay (HOSPITAL_BASED_OUTPATIENT_CLINIC_OR_DEPARTMENT_OTHER): Payer: Medicare HMO | Admitting: Oncology

## 2020-07-25 ENCOUNTER — Other Ambulatory Visit (HOSPITAL_COMMUNITY): Payer: Self-pay | Admitting: Oncology

## 2020-07-25 ENCOUNTER — Encounter (HOSPITAL_COMMUNITY): Payer: Self-pay | Admitting: Oncology

## 2020-07-25 VITALS — BP 152/55 | HR 76 | Temp 96.9°F | Resp 19 | Wt 218.4 lb

## 2020-07-25 DIAGNOSIS — N179 Acute kidney failure, unspecified: Secondary | ICD-10-CM

## 2020-07-25 DIAGNOSIS — C50312 Malignant neoplasm of lower-inner quadrant of left female breast: Secondary | ICD-10-CM

## 2020-07-25 DIAGNOSIS — Z170 Estrogen receptor positive status [ER+]: Secondary | ICD-10-CM | POA: Diagnosis not present

## 2020-07-25 NOTE — Progress Notes (Signed)
[address] [place], [place] [address]   CLINIC:  Medical Oncology/Hematology  PCP:  [name], MD [place] [address] [phone]   REASON FOR VISIT: Follow-up for breast cancer  CURRENT THERAPY: Anastrozole  BRIEF ONCOLOGIC HISTORY:  Oncology History  Malignant neoplasm of lower-inner quadrant of left breast in female, estrogen receptor positive ([place])  03/03/2019 Cancer Staging   Staging form: Breast, AJCC 8th Edition - Pathologic stage from 03/03/2019: Stage IA (pT1b, pN0, cM0, G1, ER+, PR-, HER2-) - Signed by Gardenia Phlegm, NP on 03/11/2019   03/11/2019 Initial Diagnosis   Malignant neoplasm of lower-inner quadrant of left breast in female, estrogen receptor positive ([place])      CANCER STAGING: Cancer Staging Malignant neoplasm of lower-inner quadrant of left breast in female, estrogen receptor positive ([place]) Staging form: Breast, AJCC 8th Edition - Pathologic stage from 03/03/2019: Stage IA (pT1b, pN0, cM0, G1, ER+, PR-, HER2-) - Signed by Gardenia Phlegm, NP on 03/11/2019    INTERVAL HISTORY:  [name] 74 y.o. female returns for routine follow-up for breast cancer.  Patient reports she is taking her medication as prescribed.  She denies any side effects from her medication.  She occasionally has left breast tingling sensation near her lumpectomy scar.  This happens a few times per month.  She denies any new lumps or bumps present.  She denies any new bone pain. Denies any nausea, vomiting, or diarrhea. Denies any new pains. Had not noticed any recent bleeding such as epistaxis, hematuria or hematochezia. Denies recent chest pain on exertion, shortness of breath on minimal exertion, pre-syncopal episodes, or palpitations. Denies any numbness or tingling in hands or feet. Denies any recent fevers, infections, or recent hospitalizations. Patient reports appetite at 100% and energy level at 50%.  She is eating well  maintain her weight at this time.  REVIEW OF SYSTEMS:  Review of Systems  Neurological: Negative for numbness.  All other systems reviewed and are negative.  PAST MEDICAL/SURGICAL HISTORY:  Past Medical History:  Diagnosis Date  . Aortic stenosis   . Arthritis   . AV block, 1st degree   . Back pain    occasionally with weather changes  . CAD (coronary artery disease)    Non-STEMI/Taxus stenting 100% left anterior descending (2), January 2008. Residual 70% circumflex;80% right coronary artery. Mild LVD (EF 45%);improved to 55-60% by 2-D echocardiogram,January 2009.   . DM (diabetes mellitus) ([place])    takes Metformin 1048m BID  . Dyslipidemia    takes Pravastatin daily  . Early cataracts, bilateral   . Gout    takes Allopurinol daily  . Herniated disc    left  . Lumbar herniated disc   . Myocardial infarction ([facility] 2008   pt has 2 stents  . Nocturia   . Obesity   . Unspecified essential hypertension    takes Metoprolol,Lisinopril,and Amlodipine  . Urinary frequency    Past Surgical History:  Procedure Laterality Date  . ABDOMINAL HYSTERECTOMY    . BACK SURGERY  2009/2010/2011  . BREAST BIOPSY  unknown   left  . BREAST LUMPECTOMY WITH RADIOACTIVE SEED LOCALIZATION Left 03/03/2019   Procedure: LEFT BREAST LUMPECTOMY WITH RADIOACTIVE SEED LOCALIZATION;  Surgeon: WRolm Bookbinder MD;  Location: [place]  Service: General;  Laterality: Left;  . CARDIAC CATHETERIZATION  2008   NON-STEMI/TAXUS STENTING 100% PROXIMAL LAD (X2) JANUARY 2008  . CARDIAC CATHETERIZATION  2012   occluded LAD  stents, Om1 : 70%, RCA: 80%, Ef 40-45%  . CARPAL TUNNEL RELEASE     bilateral  . CATARACT EXTRACTION W/PHACO Right 10/11/2017   Procedure: CATARACT EXTRACTION PHACO AND INTRAOCULAR LENS PLACEMENT RIGHT EYE;  Surgeon: Baruch Goldmann, MD;  [facility];  Service: Ophthalmology;  Laterality: Right;  CDE: 5.17  . CATARACT EXTRACTION W/PHACO Left 12/27/2017   Procedure:  CATARACT EXTRACTION PHACO AND INTRAOCULAR LENS PLACEMENT (IOC);  Surgeon: Baruch Goldmann, MD;  [facility];  Service: Ophthalmology;  Laterality: Left;  CDE: 2.99  . CHOLECYSTECTOMY  not sure  . COLONOSCOPY    . CORONARY ARTERY BYPASS GRAFT  09/05/2011   Procedure: CORONARY ARTERY BYPASS GRAFTING (CABG);  Surgeon: Grace Isaac, MD;  Location: [place];  Service: Open Heart Surgery;  Laterality: N/A;  CABG times three using left internal mammary artery and left leg greater saphenous vein harvested endoscopically  . LEG TENDON SURGERY  unknown   left   . LUMBAR LAMINECTOMY/DECOMPRESSION MICRODISCECTOMY Left 06/04/2014   Procedure: LUMBAR LAMINECTOMY/DECOMPRESSION MICRODISCECTOMY LUMBAR FOUR-FIVE;  Surgeon: Elaina Hoops, MD;  Location: [place] [facility];  Service: Neurosurgery;  Laterality: Left;  [name] MASS EXCISION Left 08/08/2018   Procedure: EXCISION 5CM LIPOMA ON BACK;  Surgeon: Virl Cagey, MD;  Location: [facility];  Service: General;  Laterality: Left;  . TONSILLECTOMY       SOCIAL HISTORY:  Social History   Socioeconomic History  . Marital status: Widowed    Spouse name: Not on file  . Number of children: 2  . Years of education: Not on file  . Highest education level: Not on file  Occupational History  . Not on file  Tobacco Use  . Smoking status: Never Smoker  . Smokeless tobacco: Never Used  Vaping Use  . Vaping Use: Never used  Substance and Sexual Activity  . Alcohol use: No    Alcohol/week: 0.0 standard drinks  . Drug use: No  . Sexual activity: Not Currently    Birth control/protection: Surgical  Other Topics Concern  . Not on file  Social History Narrative  . Not on file   Social Determinants of Health   Financial Resource Strain: Low Risk   . Difficulty of Paying Living Expenses: Not hard at all  Food Insecurity: No Food Insecurity  . Worried About Charity fundraiser in the Last Year: Never true  . Ran Out of Food in the Last Year: Never true    Transportation Needs: No Transportation Needs  . Lack of Transportation (Medical): No  . Lack of Transportation (Non-Medical): No  Physical Activity: Insufficiently Active  . Days of Exercise per Week: 1 day  . Minutes of Exercise per Session: 30 min  Stress: No Stress Concern Present  . Feeling of Stress : Not at all  Social Connections: Moderately Isolated  . Frequency of Communication with Friends and Family: More than three times a week  . Frequency of Social Gatherings with Friends and Family: Three times a week  . Attends Religious Services: More than 4 times per year  . Active Member of Clubs or Organizations: No  . Attends Archivist Meetings: Never  . Marital Status: Widowed  Intimate Partner Violence: Not At Risk  . Fear of Current or Ex-Partner: No  . Emotionally Abused: No  . Physically Abused: No  . Sexually Abused: No    FAMILY HISTORY:  Family History  Problem Relation Age of Onset  . Hypertension Father   . Heart  attack Sister 58  . Heart attack Brother 74  . Dementia Mother   . Lupus Brother   . Heart attack Brother   . Hypertension Brother   . Anesthesia problems Neg Hx   . Hypotension Neg Hx   . Malignant hyperthermia Neg Hx   . Pseudochol deficiency Neg Hx     CURRENT MEDICATIONS:  Outpatient Encounter Medications as of 07/25/2020  Medication Sig  . acetaminophen (TYLENOL) 500 MG tablet Take 500 mg by mouth 2 (two) times daily as needed for moderate pain or headache.   . allopurinol (ZYLOPRIM) 100 MG tablet Take 100 mg by mouth at bedtime.   [name] amLODipine (NORVASC) 10 MG tablet TAKE 1 TABLET EVERY DAY  *DOSE  INCREASE* (Patient taking differently: Take 10 mg by mouth daily. )  . anastrozole (ARIMIDEX) 1 MG tablet Take 1 tablet (1 mg total) by mouth daily.  . Ascorbic Acid (VITAMIN C) 500 MG PACK Take by mouth.  [name] aspirin 81 MG EC tablet Take by mouth.  . Cholecalciferol (VITAMIN D) 50 MCG (2000 UT) tablet Take 2,000 Units by mouth daily.   . colchicine 0.6 MG tablet Take one tablet PO and may repeat in one hour if no improvement. Then take one tablet every 3 hours until pain is relieved.  . diclofenac Sodium (VOLTAREN) 1 % GEL Apply topically.  . fluticasone (FLONASE) 50 MCG/ACT nasal spray   . lisinopril (PRINIVIL,ZESTRIL) 40 MG tablet Take 1 tablet (40 mg total) by mouth daily.  . metFORMIN (GLUCOPHAGE) 500 MG tablet Take 500 mg by mouth 2 (two) times daily with a meal.   . metoprolol tartrate (LOPRESSOR) 50 MG tablet Take 50 mg by mouth 2 (two) times daily.  . nitroGLYCERIN (NITROSTAT) 0.4 MG SL tablet PLACE 1 TABLET  UNDER THE TONGUE EVERY 5 MINUTES AS NEEDED FOR CHEST PAIN.  [name] pravastatin (PRAVACHOL) 40 MG tablet Take 40 mg by mouth at bedtime.   [name] tiZANidine (ZANAFLEX) 4 MG tablet Take 4 mg by mouth at bedtime as needed for muscle spasms.  . vitamin B-12 (CYANOCOBALAMIN) 500 MCG tablet Take 500 mcg by mouth daily.  . [DISCONTINUED] influenza vaccine adjuvanted (FLUAD QUADRIVALENT) 0.5 ML injection Inject 60 mcg as directed once as needed.  . [DISCONTINUED] Ascorbic Acid (VITAMIN C PO) Take by mouth.  . [DISCONTINUED] aspirin 81 MG tablet Take 81 mg by mouth at bedtime.   . [DISCONTINUED] Aspirin-Calcium Carbonate 81-777 MG TABS Take by mouth.  . [DISCONTINUED] chlorthalidone (HYGROTON) 25 MG tablet Take 0.5 tablets (12.5 mg total) by mouth daily. (Patient not taking: Reported on 07/25/2020)  . [DISCONTINUED] diclofenac sodium (VOLTAREN) 1 % GEL Apply 4 g topically 4 (four) times daily as needed (leg pain).   . [DISCONTINUED] metoprolol tartrate (LOPRESSOR) 25 MG tablet TAKE 1 TABLET BY MOUTH TWICE A DAY   No facility-administered encounter medications on file as of 07/25/2020.    ALLERGIES:  No Known Allergies   PHYSICAL EXAM:  ECOG Performance status: 1  Vitals:   07/25/20 1305  BP: (!) 152/55  Pulse: 76  Resp: 19  Temp: (!) 96.9 F (36.1 C)  SpO2: 100%   Filed Weights   07/25/20 1305  Weight: 218 lb 6.4  oz (99.1 kg)    Physical Exam Constitutional:      Appearance: Normal appearance. She is normal weight.  Cardiovascular:     Rate and Rhythm: Normal rate and regular rhythm.     Heart sounds: Normal heart sounds.  Pulmonary:  Effort: Pulmonary effort is normal.     Breath sounds: Normal breath sounds.  Abdominal:     General: Bowel sounds are normal.     Palpations: Abdomen is soft.  Musculoskeletal:        General: Normal range of motion.  Skin:    General: Skin is warm.  Neurological:     Mental Status: She is alert and oriented to person, place, and time. Mental status is at baseline.  Psychiatric:        Mood and Affect: Mood normal.        Behavior: Behavior normal.        Thought Content: Thought content normal.        Judgment: Judgment normal.      LABORATORY DATA:  I have reviewed the labs as listed.  CBC    Component Value Date/Time   WBC 7.7 07/18/2020 1227   RBC 4.08 07/18/2020 1227   HGB 12.1 07/18/2020 1227   HGB 13.0 09/19/2010 1152   HCT 37.3 07/18/2020 1227   HCT 39.3 09/19/2010 1152   PLT 277 07/18/2020 1227   PLT 276 09/19/2010 1152   MCV 91.4 07/18/2020 1227   MCV 89.2 09/19/2010 1152   MCH 29.7 07/18/2020 1227   MCHC 32.4 07/18/2020 1227   RDW 14.2 07/18/2020 1227   RDW 14.8 (H) 09/19/2010 1152   LYMPHSABS 2.4 07/18/2020 1227   LYMPHSABS 2.2 09/19/2010 1152   MONOABS 0.7 07/18/2020 1227   MONOABS 0.5 09/19/2010 1152   EOSABS 0.2 07/18/2020 1227   EOSABS 0.3 09/19/2010 1152   BASOSABS 0.1 07/18/2020 1227   BASOSABS 0.0 09/19/2010 1152   CMP Latest Ref Rng & Units 07/18/2020 12/08/2019 11/05/2019  Glucose 70 - 99 mg/dL 152(H) 123(H) 140(H)  BUN 8 - 23 mg/dL 25(H) 22 31(H)  Creatinine 0.44 - 1.00 mg/dL 1.47(H) 1.26(H) 1.49(H)  Sodium 135 - 145 mmol/L 140 136 138  Potassium 3.5 - 5.1 mmol/L 4.1 3.8 4.5  Chloride 98 - 111 mmol/L 105 104 103  CO2 22 - 32 mmol/L 23 21(L) 24  Calcium 8.9 - 10.3 mg/dL 9.4 9.2 9.4  Total Protein 6.5 - 8.1  g/dL 7.3 - 7.6  Total Bilirubin 0.3 - 1.2 mg/dL 0.5 - 0.8  Alkaline Phos 38 - 126 U/L 46 - 46  AST 15 - 41 U/L 23 - 24  ALT 0 - 44 U/L 26 - 28     I personally performed a face-to-face visit,   All questions were answered to patient's stated satisfaction. Encouraged patient to call with any new concerns or questions before his next visit to the cancer center and we can certain see him sooner, if needed.     ASSESSMENT & PLAN:  1.  Stage Ia left breast IDC: -Abnormal mammogram followed by left breast biopsy on 11/04/2018 showed small intraductal papilloma with usual ductal hyperplasia, no evidence of malignancy. -Left lumpectomy by Dr. Donne Hazel on 03/03/2019, pathology showing 0.8 cm grade 1 invasive ductal carcinoma, arising in a complex sclerosing lesion, free margins.  ER 100% positive, PR 0% negative, HER-2 negative.  Ki-67 5%. -She was referred to Dr. Francesca Jewett.  No radiation therapy was recommended. -Anastrozole was started on 04/02/2019.  She denies any hot flashes or musculoskeletal symptoms. -We will see her back in 5 months with repeat labs and mammogram.  2.  Bone health: -DEXA scan on 03/17/2019 showed T score of -0.2 which is normal range. -Her vitamin D level was initially low.  Vitamin D supplementation  was increased.  Labs from 07/18/20 show a vitamin D level of 35.51.  -Continue supplementation as prescribed. -We will recheck at her next visit.   3.  Elevated creatinine: -She has CKD with a creatinine between 1.3 and 1.4. -I have counseled her to drink lots of water and avoid NSAIDs. -Labs done on 07/18/20 show a creatinine of 1.47. -Given she has persistent elevation, would recommend nephrology referral.  No problem-specific Assessment & Plan notes found for this encounter.  Orders placed this encounter:  Orders Placed This Encounter  Procedures  . MM DIAG BREAST TOMO BILATERAL  . DG Bone Density  . Ambulatory referral to Nephrology   Faythe Casa,  NP 07/25/2020 3:23 PM [place] [phone]

## 2020-07-26 DIAGNOSIS — E1122 Type 2 diabetes mellitus with diabetic chronic kidney disease: Secondary | ICD-10-CM | POA: Diagnosis not present

## 2020-07-26 DIAGNOSIS — I129 Hypertensive chronic kidney disease with stage 1 through stage 4 chronic kidney disease, or unspecified chronic kidney disease: Secondary | ICD-10-CM | POA: Diagnosis not present

## 2020-07-26 DIAGNOSIS — N182 Chronic kidney disease, stage 2 (mild): Secondary | ICD-10-CM | POA: Diagnosis not present

## 2020-09-05 DIAGNOSIS — E1122 Type 2 diabetes mellitus with diabetic chronic kidney disease: Secondary | ICD-10-CM | POA: Diagnosis not present

## 2020-09-05 DIAGNOSIS — N182 Chronic kidney disease, stage 2 (mild): Secondary | ICD-10-CM | POA: Diagnosis not present

## 2020-09-05 DIAGNOSIS — E1165 Type 2 diabetes mellitus with hyperglycemia: Secondary | ICD-10-CM | POA: Diagnosis not present

## 2020-09-08 DIAGNOSIS — Z6837 Body mass index (BMI) 37.0-37.9, adult: Secondary | ICD-10-CM | POA: Diagnosis not present

## 2020-09-08 DIAGNOSIS — I2510 Atherosclerotic heart disease of native coronary artery without angina pectoris: Secondary | ICD-10-CM | POA: Diagnosis not present

## 2020-09-08 DIAGNOSIS — E1122 Type 2 diabetes mellitus with diabetic chronic kidney disease: Secondary | ICD-10-CM | POA: Diagnosis not present

## 2020-09-08 DIAGNOSIS — Z955 Presence of coronary angioplasty implant and graft: Secondary | ICD-10-CM | POA: Diagnosis not present

## 2020-09-08 DIAGNOSIS — E1151 Type 2 diabetes mellitus with diabetic peripheral angiopathy without gangrene: Secondary | ICD-10-CM | POA: Diagnosis not present

## 2020-09-08 DIAGNOSIS — I739 Peripheral vascular disease, unspecified: Secondary | ICD-10-CM | POA: Diagnosis not present

## 2020-09-08 DIAGNOSIS — C50912 Malignant neoplasm of unspecified site of left female breast: Secondary | ICD-10-CM | POA: Diagnosis not present

## 2020-09-08 DIAGNOSIS — I10 Essential (primary) hypertension: Secondary | ICD-10-CM | POA: Diagnosis not present

## 2020-09-15 DIAGNOSIS — M109 Gout, unspecified: Secondary | ICD-10-CM | POA: Diagnosis not present

## 2020-10-13 DIAGNOSIS — E1122 Type 2 diabetes mellitus with diabetic chronic kidney disease: Secondary | ICD-10-CM | POA: Diagnosis not present

## 2020-10-13 DIAGNOSIS — I129 Hypertensive chronic kidney disease with stage 1 through stage 4 chronic kidney disease, or unspecified chronic kidney disease: Secondary | ICD-10-CM | POA: Diagnosis not present

## 2020-10-13 DIAGNOSIS — Z79899 Other long term (current) drug therapy: Secondary | ICD-10-CM | POA: Diagnosis not present

## 2020-10-13 DIAGNOSIS — E559 Vitamin D deficiency, unspecified: Secondary | ICD-10-CM | POA: Diagnosis not present

## 2020-10-13 DIAGNOSIS — Z7189 Other specified counseling: Secondary | ICD-10-CM | POA: Diagnosis not present

## 2020-10-13 DIAGNOSIS — N189 Chronic kidney disease, unspecified: Secondary | ICD-10-CM | POA: Diagnosis not present

## 2020-10-14 ENCOUNTER — Other Ambulatory Visit (HOSPITAL_COMMUNITY): Payer: Self-pay | Admitting: Nephrology

## 2020-10-14 DIAGNOSIS — E559 Vitamin D deficiency, unspecified: Secondary | ICD-10-CM

## 2020-10-14 DIAGNOSIS — I129 Hypertensive chronic kidney disease with stage 1 through stage 4 chronic kidney disease, or unspecified chronic kidney disease: Secondary | ICD-10-CM

## 2020-10-14 DIAGNOSIS — E1122 Type 2 diabetes mellitus with diabetic chronic kidney disease: Secondary | ICD-10-CM

## 2020-10-21 ENCOUNTER — Ambulatory Visit (HOSPITAL_COMMUNITY)
Admission: RE | Admit: 2020-10-21 | Discharge: 2020-10-21 | Disposition: A | Discharge status: Home / Self Care | Payer: Medicare HMO | Source: Ambulatory Visit | Attending: Nephrology | Admitting: Nephrology

## 2020-10-21 DIAGNOSIS — N281 Cyst of kidney, acquired: Secondary | ICD-10-CM | POA: Diagnosis not present

## 2020-10-21 DIAGNOSIS — E1122 Type 2 diabetes mellitus with diabetic chronic kidney disease: Secondary | ICD-10-CM | POA: Diagnosis not present

## 2020-10-21 DIAGNOSIS — E559 Vitamin D deficiency, unspecified: Secondary | ICD-10-CM | POA: Diagnosis not present

## 2020-10-21 DIAGNOSIS — I129 Hypertensive chronic kidney disease with stage 1 through stage 4 chronic kidney disease, or unspecified chronic kidney disease: Secondary | ICD-10-CM | POA: Insufficient documentation

## 2020-10-21 DIAGNOSIS — Z79899 Other long term (current) drug therapy: Secondary | ICD-10-CM | POA: Diagnosis not present

## 2020-10-21 DIAGNOSIS — N189 Chronic kidney disease, unspecified: Secondary | ICD-10-CM | POA: Diagnosis not present

## 2020-10-21 DIAGNOSIS — Z7189 Other specified counseling: Secondary | ICD-10-CM | POA: Diagnosis not present

## 2020-11-14 ENCOUNTER — Ambulatory Visit: Payer: Medicare HMO | Admitting: Cardiology

## 2020-11-14 ENCOUNTER — Encounter: Payer: Self-pay | Admitting: Cardiology

## 2020-11-14 ENCOUNTER — Other Ambulatory Visit: Payer: Self-pay

## 2020-11-14 VITALS — BP 142/70 | HR 68 | Ht 62.0 in | Wt 213.0 lb

## 2020-11-14 DIAGNOSIS — E782 Mixed hyperlipidemia: Secondary | ICD-10-CM

## 2020-11-14 DIAGNOSIS — I10 Essential (primary) hypertension: Secondary | ICD-10-CM

## 2020-11-14 DIAGNOSIS — I2510 Atherosclerotic heart disease of native coronary artery without angina pectoris: Secondary | ICD-10-CM | POA: Diagnosis not present

## 2020-11-14 MED ORDER — NITROGLYCERIN 0.4 MG SL SUBL: SUBLINGUAL_TABLET | SUBLINGUAL | 3 refills | Status: DC

## 2020-11-14 NOTE — Addendum Note (Signed)
Addended by: Barbarann Ehlers A on: 11/14/2020 03:24 PM   Modules accepted: Orders

## 2020-11-14 NOTE — Progress Notes (Signed)
Clinical Summary [name] is a 75 y.o.female seen today for follow up of the following medical problems.   1. CAD  - CABG Jan 2013: 3 vessel (LIMA to LAD, SVG to OM, SVG to acute marginal).She had prior stenting as described below  - Jan 2013 echo LVEF 55-60%    -no recent chest pain - no SOB or DOE - compliant with meds    2. Hyperlipidemia  - on pravastatin, compliant  - she reports she was on lipitor in the past, however it was changed for unclear reasons.  -upcoming labs with pcp    3. HTN  - home bp's twice a week 130s/70s   4. PAD - followed by vascular. - due for follow up  5. CKD  - followed by Dr Theador Hawthorne  Past Medical History:  Diagnosis Date  . Aortic stenosis   . Arthritis   . AV block, 1st degree   . Back pain    occasionally with weather changes  . CAD (coronary artery disease)    Non-STEMI/Taxus stenting 100% left anterior descending (2), January 2008. Residual 70% circumflex;80% right coronary artery. Mild LVD (EF 45%);improved to 55-60% by 2-D echocardiogram,January 2009.   . DM (diabetes mellitus) ([place])    takes Metformin 1000mg  BID  . Dyslipidemia    takes Pravastatin daily  . Early cataracts, bilateral   . Gout    takes Allopurinol daily  . Herniated disc    left  . Lumbar herniated disc   . Myocardial infarction [facility]) 2008   pt has 2 stents  . Nocturia   . Obesity   . Unspecified essential hypertension    takes Metoprolol,Lisinopril,and Amlodipine  . Urinary frequency      No Known Allergies   Current Outpatient Medications  Medication Sig Dispense Refill  . acetaminophen (TYLENOL) 500 MG tablet Take 500 mg by mouth 2 (two) times daily as needed for moderate pain or headache.     . allopurinol (ZYLOPRIM) 100 MG tablet Take 100 mg by mouth at bedtime.     [name] amLODipine (NORVASC) 10 MG tablet TAKE 1 TABLET EVERY DAY  *DOSE  INCREASE* (Patient taking differently: Take 10 mg by mouth daily. ) 90 tablet  3  . anastrozole (ARIMIDEX) 1 MG tablet Take 1 tablet (1 mg total) by mouth daily. 90 tablet 3  . Ascorbic Acid (VITAMIN C) 500 MG PACK Take by mouth.    [name] aspirin 81 MG EC tablet Take by mouth.    . Cholecalciferol (VITAMIN D) 50 MCG (2000 UT) tablet Take 2,000 Units by mouth daily.    . colchicine 0.6 MG tablet Take one tablet PO and may repeat in one hour if no improvement. Then take one tablet every 3 hours until pain is relieved. 30 tablet 0  . diclofenac Sodium (VOLTAREN) 1 % GEL Apply topically.    . fluticasone (FLONASE) 50 MCG/ACT nasal spray     . lisinopril (PRINIVIL,ZESTRIL) 40 MG tablet Take 1 tablet (40 mg total) by mouth daily. 90 tablet 3  . metFORMIN (GLUCOPHAGE) 500 MG tablet Take 500 mg by mouth 2 (two) times daily with a meal.     . metoprolol tartrate (LOPRESSOR) 50 MG tablet Take 50 mg by mouth 2 (two) times daily.    . nitroGLYCERIN (NITROSTAT) 0.4 MG SL tablet PLACE 1 TABLET  UNDER THE TONGUE EVERY 5 MINUTES AS NEEDED FOR CHEST PAIN. 25 tablet 3  . pravastatin (PRAVACHOL) 40 MG tablet Take 40 mg  by mouth at bedtime.     [name] tiZANidine (ZANAFLEX) 4 MG tablet Take 4 mg by mouth at bedtime as needed for muscle spasms.  0  . vitamin B-12 (CYANOCOBALAMIN) 500 MCG tablet Take 500 mcg by mouth daily.     No current facility-administered medications for this visit.     Past Surgical History:  Procedure Laterality Date  . ABDOMINAL HYSTERECTOMY    . BACK SURGERY  2009/2010/2011  . BREAST BIOPSY  unknown   left  . BREAST LUMPECTOMY WITH RADIOACTIVE SEED LOCALIZATION Left 03/03/2019   Procedure: LEFT BREAST LUMPECTOMY WITH RADIOACTIVE SEED LOCALIZATION;  Surgeon: Rolm Bookbinder, MD;  Location: [place];  Service: General;  Laterality: Left;  . CARDIAC CATHETERIZATION  2008   NON-STEMI/TAXUS STENTING 100% PROXIMAL LAD (X2) JANUARY 2008  . CARDIAC CATHETERIZATION  2012   occluded LAD stents, Om1 : 70%, RCA: 80%, Ef 40-45%  . CARPAL TUNNEL RELEASE      bilateral  . CATARACT EXTRACTION W/PHACO Right 10/11/2017   Procedure: CATARACT EXTRACTION PHACO AND INTRAOCULAR LENS PLACEMENT RIGHT EYE;  Surgeon: Baruch Goldmann, MD;  [facility];  Service: Ophthalmology;  Laterality: Right;  CDE: 5.17  . CATARACT EXTRACTION W/PHACO Left 12/27/2017   Procedure: CATARACT EXTRACTION PHACO AND INTRAOCULAR LENS PLACEMENT (IOC);  Surgeon: Baruch Goldmann, MD;  [facility];  Service: Ophthalmology;  Laterality: Left;  CDE: 2.99  . CHOLECYSTECTOMY  not sure  . COLONOSCOPY    . CORONARY ARTERY BYPASS GRAFT  09/05/2011   Procedure: CORONARY ARTERY BYPASS GRAFTING (CABG);  Surgeon: Grace Isaac, MD;  Location: [place];  Service: Open Heart Surgery;  Laterality: N/A;  CABG times three using left internal mammary artery and left leg greater saphenous vein harvested endoscopically  . LEG TENDON SURGERY  unknown   left   . LUMBAR LAMINECTOMY/DECOMPRESSION MICRODISCECTOMY Left 06/04/2014   Procedure: LUMBAR LAMINECTOMY/DECOMPRESSION MICRODISCECTOMY LUMBAR FOUR-FIVE;  Surgeon: Elaina Hoops, MD;  Location: [place] [facility];  Service: Neurosurgery;  Laterality: Left;  [name] MASS EXCISION Left 08/08/2018   Procedure: EXCISION 5CM LIPOMA ON BACK;  Surgeon: Virl Cagey, MD;  [facility];  Service: General;  Laterality: Left;  . TONSILLECTOMY       No Known Allergies    Family History  Problem Relation Age of Onset  . Hypertension Father   . Heart attack Sister 3  . Heart attack Brother 15  . Dementia Mother   . Lupus Brother   . Heart attack Brother   . Hypertension Brother   . Anesthesia problems Neg Hx   . Hypotension Neg Hx   . Malignant hyperthermia Neg Hx   . Pseudochol deficiency Neg Hx      Social History [name] reports that she has never smoked. She has never used smokeless tobacco. [name] reports no history of alcohol use.   Review of Systems CONSTITUTIONAL: No weight loss, fever, chills, weakness or fatigue.  HEENT: Eyes: No  visual loss, blurred vision, double vision or yellow sclerae.No hearing loss, sneezing, congestion, runny nose or sore throat.  SKIN: No rash or itching.  CARDIOVASCULAR: per hpi RESPIRATORY: No shortness of breath, cough or sputum.  GASTROINTESTINAL: No anorexia, nausea, vomiting or diarrhea. No abdominal pain or blood.  GENITOURINARY: No burning on urination, no polyuria NEUROLOGICAL: No headache, dizziness, syncope, paralysis, ataxia, numbness or tingling in the extremities. No change in bowel or bladder control.  MUSCULOSKELETAL: No muscle, back pain, joint pain or stiffness.  LYMPHATICS: No  enlarged nodes. No history of splenectomy.  PSYCHIATRIC: No history of depression or anxiety.  ENDOCRINOLOGIC: No reports of sweating, cold or heat intolerance. No polyuria or polydipsia.  [name]   Physical Examination Today's Vitals   11/14/20 1315  BP: (!) 142/70  Pulse: 68  SpO2: 99%  Weight: 213 lb (96.6 kg)  Height: 5\' 2"  (1.575 m)   Body mass index is 38.96 kg/m.  Gen: resting comfortably, no acute distress HEENT: no scleral icterus, pupils equal round and reactive, no palptable cervical adenopathy,  CV: RRR, no m/r/g, no jvd Resp: Clear to auscultation bilaterally GI: abdomen is soft, non-tender, non-distended, normal bowel sounds, no hepatosplenomegaly MSK: extremities are warm, no edema.  Skin: warm, no rash Neuro:  no focal deficits Psych: appropriate affect      Assessment and Plan  1. CAD  -no symptoms, continue current meds - EKG today shows SR, no ischemic changes  2. HTN  - recheck 132/70, reasonable control. If needed would switch lopressor to coreg in the future for better bp effects  3. Hyperlipidemia  - continue statin, requset pcp labs   F/u 6 months  Arnoldo Lenis, M.D.

## 2020-11-14 NOTE — Patient Instructions (Signed)
Medication Instructions:  Your physician recommends that you continue on your current medications as directed. Please refer to the Current Medication list given to you today.  *If you need a refill on your cardiac medications before your next appointment, please call your pharmacy*   Lab Work: [facility] Provider If you have labs (blood work) drawn today and your tests are completely normal, you will receive your results only by: [name] MyChart Message (if you have MyChart) OR . A paper copy in the mail If you have any lab test that is abnormal or we need to change your treatment, we will call you to review the results.   Testing/Procedures: None   Follow-Up: At [facility], you and your health needs are our priority.  As part of our continuing mission to provide you with exceptional heart care, we have created designated Provider Care Teams.  These Care Teams include your primary Cardiologist (physician) and Advanced Practice Providers (APPs -  Physician Assistants and Nurse Practitioners) who all work together to provide you with the care you need, when you need it.  We recommend signing up for the patient portal called "MyChart".  Sign up information is provided on this After Visit Summary.  MyChart is used to connect with patients for Virtual Visits (Telemedicine).  Patients are able to view lab/test results, encounter notes, upcoming appointments, etc.  Non-urgent messages can be sent to your provider as well.   To learn more about what you can do with MyChart, go to [email or web address].    Your next appointment:   6 month(s)  The format for your next appointment:   In Person  Provider:   Carlyle Dolly, MD   Other Instructions

## 2020-11-15 ENCOUNTER — Telehealth: Payer: Self-pay

## 2020-11-15 ENCOUNTER — Other Ambulatory Visit: Payer: Self-pay

## 2020-11-15 DIAGNOSIS — I739 Peripheral vascular disease, unspecified: Secondary | ICD-10-CM

## 2020-11-15 NOTE — Telephone Encounter (Signed)
Patient has not been seen by our office since NP visit in 05/2018. She calls today to make appt because her "legs hurt." When asked if it was calves or feet - she repeated "legs". Denies swelling. Says it hurts when she walks and at rest - has been going on for several weeks - thought it was related to back pain, but her PCP told her to call [facility] and make appt. Scheduled her with TH, ABIs and carotid duplex per recall.

## 2020-11-16 DIAGNOSIS — I129 Hypertensive chronic kidney disease with stage 1 through stage 4 chronic kidney disease, or unspecified chronic kidney disease: Secondary | ICD-10-CM | POA: Diagnosis not present

## 2020-11-16 DIAGNOSIS — N189 Chronic kidney disease, unspecified: Secondary | ICD-10-CM | POA: Diagnosis not present

## 2020-11-16 DIAGNOSIS — E1122 Type 2 diabetes mellitus with diabetic chronic kidney disease: Secondary | ICD-10-CM | POA: Diagnosis not present

## 2020-11-16 DIAGNOSIS — N281 Cyst of kidney, acquired: Secondary | ICD-10-CM | POA: Diagnosis not present

## 2020-11-16 DIAGNOSIS — R768 Other specified abnormal immunological findings in serum: Secondary | ICD-10-CM | POA: Diagnosis not present

## 2020-11-23 DIAGNOSIS — I6523 Occlusion and stenosis of bilateral carotid arteries: Secondary | ICD-10-CM | POA: Diagnosis not present

## 2020-11-23 DIAGNOSIS — E7849 Other hyperlipidemia: Secondary | ICD-10-CM | POA: Diagnosis not present

## 2020-11-23 DIAGNOSIS — E1151 Type 2 diabetes mellitus with diabetic peripheral angiopathy without gangrene: Secondary | ICD-10-CM | POA: Diagnosis not present

## 2020-11-23 DIAGNOSIS — E1165 Type 2 diabetes mellitus with hyperglycemia: Secondary | ICD-10-CM | POA: Diagnosis not present

## 2020-11-23 DIAGNOSIS — E782 Mixed hyperlipidemia: Secondary | ICD-10-CM | POA: Diagnosis not present

## 2020-11-23 DIAGNOSIS — N182 Chronic kidney disease, stage 2 (mild): Secondary | ICD-10-CM | POA: Diagnosis not present

## 2020-11-23 DIAGNOSIS — E1122 Type 2 diabetes mellitus with diabetic chronic kidney disease: Secondary | ICD-10-CM | POA: Diagnosis not present

## 2020-11-23 DIAGNOSIS — I129 Hypertensive chronic kidney disease with stage 1 through stage 4 chronic kidney disease, or unspecified chronic kidney disease: Secondary | ICD-10-CM | POA: Diagnosis not present

## 2020-11-23 DIAGNOSIS — I10 Essential (primary) hypertension: Secondary | ICD-10-CM | POA: Diagnosis not present

## 2020-11-29 ENCOUNTER — Ambulatory Visit (HOSPITAL_COMMUNITY)
Admission: RE | Admit: 2020-11-29 | Discharge: 2020-11-29 | Disposition: A | Discharge status: Home / Self Care | Payer: Medicare HMO | Source: Ambulatory Visit | Attending: Vascular Surgery | Admitting: Vascular Surgery

## 2020-11-29 ENCOUNTER — Other Ambulatory Visit: Payer: Self-pay

## 2020-11-29 ENCOUNTER — Ambulatory Visit: Payer: Medicare HMO | Admitting: Vascular Surgery

## 2020-11-29 ENCOUNTER — Encounter: Payer: Self-pay | Admitting: Vascular Surgery

## 2020-11-29 ENCOUNTER — Ambulatory Visit (INDEPENDENT_AMBULATORY_CARE_PROVIDER_SITE_OTHER)
Admission: RE | Admit: 2020-11-29 | Discharge: 2020-11-29 | Disposition: A | Discharge status: Home / Self Care | Payer: Medicare HMO | Source: Ambulatory Visit | Attending: Vascular Surgery | Admitting: Vascular Surgery

## 2020-11-29 VITALS — BP 133/66 | HR 65 | Temp 97.9°F | Resp 20 | Ht 62.0 in | Wt 209.0 lb

## 2020-11-29 DIAGNOSIS — I6523 Occlusion and stenosis of bilateral carotid arteries: Secondary | ICD-10-CM | POA: Diagnosis not present

## 2020-11-29 DIAGNOSIS — I739 Peripheral vascular disease, unspecified: Secondary | ICD-10-CM

## 2020-11-29 NOTE — Progress Notes (Signed)
[facility] [place] PROGRESS NOTE  ASSESSMENT / PLAN: [name] is a 75 y.o. 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 Progression of carotid disease noted. No progression of PAD.  I do not think the patient is a candidate for any type of intervention for carotid artery stenosis or claudication at the moment, but needs close monitoring.   The patient should continue best medical therapy for carotid artery stenosis including: Complete cessation from all tobacco products. Blood glucose control with goal A1c < 7%. Blood pressure control with goal blood pressure < 140/90 mmHg. Lipid reduction therapy with goal LDL-C <100 mg/dL (<70 if symptomatic from carotid artery stenosis).  Aspirin 81mg  PO QD.  Atorvastatin 40-80mg  PO QD (or other "high intensity" statin therapy).  Follow up in 6 months.   SUBJECTIVE: No neurologic symptoms. No recent stroke / TIA. Reports stable claudication. Continues to exercise in the pool at the [facility] No ischemic rest pain. No ulceration.  OBJECTIVE: BP 133/66 (BP Location: Right Arm, Patient Position: Sitting, Cuff Size: Large)   Pulse 65   Temp 97.9 F (36.6 C)   Resp 20   Ht 5\' 2"  (1.575 m)   Wt 94.8 kg   SpO2 99%   BMI 38.23 kg/m   NAD RRR Unlabored No palpable pedal pulses.  CBC Latest Ref Rng & Units 07/18/2020 11/05/2019 07/02/2019  WBC 4.0 - 10.5 K/uL 7.7 8.6 8.0  Hemoglobin 12.0 - 15.0 g/dL 12.1 12.7 12.5  Hematocrit 36.0 - 46.0 % 37.3 39.5 39.0  Platelets 150 - 400 K/uL 277 299 237     CMP Latest Ref Rng & Units 07/18/2020 12/08/2019 11/05/2019  Glucose 70 - 99 mg/dL 152(H) 123(H) 140(H)  BUN 8 - 23 mg/dL 25(H) 22 31(H)  Creatinine 0.44 - 1.00 mg/dL 1.47(H) 1.26(H) 1.49(H)  Sodium 135 - 145 mmol/L 140 136 138  Potassium 3.5 - 5.1 mmol/L 4.1 3.8 4.5  Chloride 98 - 111  mmol/L 105 104 103  CO2 22 - 32 mmol/L 23 21(L) 24  Calcium 8.9 - 10.3 mg/dL 9.4 9.2 9.4  Total Protein 6.5 - 8.1 g/dL 7.3 - 7.6  Total Bilirubin 0.3 - 1.2 mg/dL 0.5 - 0.8  Alkaline Phos 38 - 126 U/L 46 - 46  AST 15 - 41 U/L 23 - 24  ALT 0 - 44 U/L 26 - 28    CrCl cannot be calculated (Patient's most recent lab result is older than the maximum 21 days allowed.).  [name]. Chelsea Breed, MD [facility] Phone Number: [phone] 11/29/2020 4:26 PM

## 2020-11-30 DIAGNOSIS — I6523 Occlusion and stenosis of bilateral carotid arteries: Secondary | ICD-10-CM | POA: Diagnosis not present

## 2020-11-30 DIAGNOSIS — E1122 Type 2 diabetes mellitus with diabetic chronic kidney disease: Secondary | ICD-10-CM | POA: Diagnosis not present

## 2020-11-30 DIAGNOSIS — C50912 Malignant neoplasm of unspecified site of left female breast: Secondary | ICD-10-CM | POA: Diagnosis not present

## 2020-11-30 DIAGNOSIS — I10 Essential (primary) hypertension: Secondary | ICD-10-CM | POA: Diagnosis not present

## 2020-11-30 DIAGNOSIS — E1151 Type 2 diabetes mellitus with diabetic peripheral angiopathy without gangrene: Secondary | ICD-10-CM | POA: Diagnosis not present

## 2020-11-30 DIAGNOSIS — Z955 Presence of coronary angioplasty implant and graft: Secondary | ICD-10-CM | POA: Diagnosis not present

## 2020-11-30 DIAGNOSIS — E7849 Other hyperlipidemia: Secondary | ICD-10-CM | POA: Diagnosis not present

## 2020-11-30 DIAGNOSIS — Z0001 Encounter for general adult medical examination with abnormal findings: Secondary | ICD-10-CM | POA: Diagnosis not present

## 2020-12-01 ENCOUNTER — Other Ambulatory Visit: Payer: Self-pay

## 2020-12-01 ENCOUNTER — Other Ambulatory Visit (HOSPITAL_COMMUNITY): Payer: Self-pay | Admitting: Hematology

## 2020-12-01 DIAGNOSIS — C50312 Malignant neoplasm of lower-inner quadrant of left female breast: Secondary | ICD-10-CM

## 2020-12-01 DIAGNOSIS — I739 Peripheral vascular disease, unspecified: Secondary | ICD-10-CM

## 2020-12-01 DIAGNOSIS — Z170 Estrogen receptor positive status [ER+]: Secondary | ICD-10-CM

## 2020-12-01 DIAGNOSIS — I6523 Occlusion and stenosis of bilateral carotid arteries: Secondary | ICD-10-CM

## 2020-12-12 ENCOUNTER — Other Ambulatory Visit (HOSPITAL_COMMUNITY): Payer: Self-pay

## 2020-12-12 DIAGNOSIS — C50312 Malignant neoplasm of lower-inner quadrant of left female breast: Secondary | ICD-10-CM

## 2020-12-13 ENCOUNTER — Inpatient Hospital Stay (HOSPITAL_COMMUNITY): Payer: Medicare HMO

## 2020-12-13 ENCOUNTER — Ambulatory Visit (HOSPITAL_COMMUNITY)
Admission: RE | Admit: 2020-12-13 | Discharge: 2020-12-13 | Disposition: A | Discharge status: Home / Self Care | Payer: Medicare HMO | Source: Ambulatory Visit | Attending: Oncology | Admitting: Oncology

## 2020-12-13 DIAGNOSIS — C50312 Malignant neoplasm of lower-inner quadrant of left female breast: Secondary | ICD-10-CM

## 2020-12-13 DIAGNOSIS — Z170 Estrogen receptor positive status [ER+]: Secondary | ICD-10-CM

## 2020-12-13 DIAGNOSIS — R922 Inconclusive mammogram: Secondary | ICD-10-CM | POA: Diagnosis not present

## 2020-12-20 ENCOUNTER — Ambulatory Visit (HOSPITAL_COMMUNITY): Payer: Medicare HMO | Admitting: Hematology

## 2020-12-24 DIAGNOSIS — E1122 Type 2 diabetes mellitus with diabetic chronic kidney disease: Secondary | ICD-10-CM | POA: Diagnosis not present

## 2020-12-24 DIAGNOSIS — I129 Hypertensive chronic kidney disease with stage 1 through stage 4 chronic kidney disease, or unspecified chronic kidney disease: Secondary | ICD-10-CM | POA: Diagnosis not present

## 2020-12-24 DIAGNOSIS — N182 Chronic kidney disease, stage 2 (mild): Secondary | ICD-10-CM | POA: Diagnosis not present

## 2020-12-24 DIAGNOSIS — I6523 Occlusion and stenosis of bilateral carotid arteries: Secondary | ICD-10-CM | POA: Diagnosis not present

## 2020-12-27 ENCOUNTER — Other Ambulatory Visit: Payer: Self-pay | Admitting: Cardiology

## 2020-12-27 ENCOUNTER — Telehealth: Payer: Self-pay

## 2020-12-27 MED ORDER — METOPROLOL TARTRATE 50 MG PO TABS: 50.0000 mg | ORAL_TABLET | Freq: Two times a day (BID) | ORAL | 3 refills | Status: AC

## 2020-12-27 NOTE — Telephone Encounter (Signed)
Medication refill request for Metoprolol Tartrate 50 mg tablets approved and sent to [facility].

## 2021-01-04 DIAGNOSIS — E119 Type 2 diabetes mellitus without complications: Secondary | ICD-10-CM | POA: Diagnosis not present

## 2021-01-04 DIAGNOSIS — C50312 Malignant neoplasm of lower-inner quadrant of left female breast: Secondary | ICD-10-CM | POA: Diagnosis not present

## 2021-01-04 DIAGNOSIS — Z79811 Long term (current) use of aromatase inhibitors: Secondary | ICD-10-CM | POA: Diagnosis not present

## 2021-01-04 DIAGNOSIS — M549 Dorsalgia, unspecified: Secondary | ICD-10-CM | POA: Diagnosis not present

## 2021-01-04 DIAGNOSIS — G8929 Other chronic pain: Secondary | ICD-10-CM | POA: Diagnosis not present

## 2021-01-04 DIAGNOSIS — I252 Old myocardial infarction: Secondary | ICD-10-CM | POA: Diagnosis not present

## 2021-01-04 DIAGNOSIS — Z951 Presence of aortocoronary bypass graft: Secondary | ICD-10-CM | POA: Diagnosis not present

## 2021-01-04 DIAGNOSIS — Z170 Estrogen receptor positive status [ER+]: Secondary | ICD-10-CM | POA: Diagnosis not present

## 2021-01-20 ENCOUNTER — Other Ambulatory Visit: Payer: Self-pay

## 2021-01-20 ENCOUNTER — Inpatient Hospital Stay (HOSPITAL_COMMUNITY): Payer: Medicare HMO | Attending: Hematology

## 2021-01-20 DIAGNOSIS — E1136 Type 2 diabetes mellitus with diabetic cataract: Secondary | ICD-10-CM | POA: Diagnosis not present

## 2021-01-20 DIAGNOSIS — Z7984 Long term (current) use of oral hypoglycemic drugs: Secondary | ICD-10-CM | POA: Insufficient documentation

## 2021-01-20 DIAGNOSIS — R7989 Other specified abnormal findings of blood chemistry: Secondary | ICD-10-CM | POA: Diagnosis not present

## 2021-01-20 DIAGNOSIS — E669 Obesity, unspecified: Secondary | ICD-10-CM | POA: Insufficient documentation

## 2021-01-20 DIAGNOSIS — Z79899 Other long term (current) drug therapy: Secondary | ICD-10-CM | POA: Diagnosis not present

## 2021-01-20 DIAGNOSIS — Z7982 Long term (current) use of aspirin: Secondary | ICD-10-CM | POA: Diagnosis not present

## 2021-01-20 DIAGNOSIS — I2510 Atherosclerotic heart disease of native coronary artery without angina pectoris: Secondary | ICD-10-CM | POA: Diagnosis not present

## 2021-01-20 DIAGNOSIS — Z170 Estrogen receptor positive status [ER+]: Secondary | ICD-10-CM | POA: Diagnosis present

## 2021-01-20 DIAGNOSIS — E559 Vitamin D deficiency, unspecified: Secondary | ICD-10-CM | POA: Diagnosis not present

## 2021-01-20 DIAGNOSIS — I252 Old myocardial infarction: Secondary | ICD-10-CM | POA: Diagnosis not present

## 2021-01-20 DIAGNOSIS — I129 Hypertensive chronic kidney disease with stage 1 through stage 4 chronic kidney disease, or unspecified chronic kidney disease: Secondary | ICD-10-CM | POA: Insufficient documentation

## 2021-01-20 DIAGNOSIS — C50312 Malignant neoplasm of lower-inner quadrant of left female breast: Secondary | ICD-10-CM | POA: Diagnosis present

## 2021-01-20 DIAGNOSIS — Z791 Long term (current) use of non-steroidal anti-inflammatories (NSAID): Secondary | ICD-10-CM | POA: Diagnosis not present

## 2021-01-20 DIAGNOSIS — E1122 Type 2 diabetes mellitus with diabetic chronic kidney disease: Secondary | ICD-10-CM | POA: Insufficient documentation

## 2021-01-20 DIAGNOSIS — E785 Hyperlipidemia, unspecified: Secondary | ICD-10-CM | POA: Insufficient documentation

## 2021-01-20 DIAGNOSIS — N189 Chronic kidney disease, unspecified: Secondary | ICD-10-CM | POA: Diagnosis not present

## 2021-01-20 LAB — CBC WITH DIFFERENTIAL/PLATELET
Abs Immature Granulocytes: 0.02 10*3/uL (ref 0.00–0.07)
Basophils Absolute: 0.1 10*3/uL (ref 0.0–0.1)
Basophils Relative: 1 %
Eosinophils Absolute: 0.2 10*3/uL (ref 0.0–0.5)
Eosinophils Relative: 3 %
HCT: 36.9 % (ref 36.0–46.0)
Hemoglobin: 12.3 g/dL (ref 12.0–15.0)
Immature Granulocytes: 0 %
Lymphocytes Relative: 35 %
Lymphs Abs: 2.3 10*3/uL (ref 0.7–4.0)
MCH: 30.5 pg (ref 26.0–34.0)
MCHC: 33.3 g/dL (ref 30.0–36.0)
MCV: 91.6 fL (ref 80.0–100.0)
Monocytes Absolute: 0.5 10*3/uL (ref 0.1–1.0)
Monocytes Relative: 8 %
Neutro Abs: 3.4 10*3/uL (ref 1.7–7.7)
Neutrophils Relative %: 53 %
Platelets: 260 10*3/uL (ref 150–400)
RBC: 4.03 MIL/uL (ref 3.87–5.11)
RDW: 13.8 % (ref 11.5–15.5)
WBC: 6.4 10*3/uL (ref 4.0–10.5)
nRBC: 0 % (ref 0.0–0.2)

## 2021-01-20 LAB — COMPREHENSIVE METABOLIC PANEL
ALT: 27 U/L (ref 0–44)
AST: 24 U/L (ref 15–41)
Albumin: 4.1 g/dL (ref 3.5–5.0)
Alkaline Phosphatase: 50 U/L (ref 38–126)
Anion gap: 10 (ref 5–15)
BUN: 21 mg/dL (ref 8–23)
CO2: 23 mmol/L (ref 22–32)
Calcium: 9.1 mg/dL (ref 8.9–10.3)
Chloride: 105 mmol/L (ref 98–111)
Creatinine, Ser: 1.13 mg/dL — ABNORMAL HIGH (ref 0.44–1.00)
GFR, Estimated: 50 mL/min — ABNORMAL LOW (ref >60)
Glucose, Bld: 154 mg/dL — ABNORMAL HIGH (ref 70–99)
Potassium: 3.9 mmol/L (ref 3.5–5.1)
Sodium: 138 mmol/L (ref 135–145)
Total Bilirubin: 1.1 mg/dL (ref 0.3–1.2)
Total Protein: 7 g/dL (ref 6.5–8.1)

## 2021-01-20 LAB — VITAMIN D 25 HYDROXY (VIT D DEFICIENCY, FRACTURES): Vit D, 25-Hydroxy: 46.55 ng/mL (ref 30–100)

## 2021-01-23 DIAGNOSIS — I129 Hypertensive chronic kidney disease with stage 1 through stage 4 chronic kidney disease, or unspecified chronic kidney disease: Secondary | ICD-10-CM | POA: Diagnosis not present

## 2021-01-23 DIAGNOSIS — E1122 Type 2 diabetes mellitus with diabetic chronic kidney disease: Secondary | ICD-10-CM | POA: Diagnosis not present

## 2021-01-23 DIAGNOSIS — I6523 Occlusion and stenosis of bilateral carotid arteries: Secondary | ICD-10-CM | POA: Diagnosis not present

## 2021-01-23 DIAGNOSIS — N182 Chronic kidney disease, stage 2 (mild): Secondary | ICD-10-CM | POA: Diagnosis not present

## 2021-01-24 ENCOUNTER — Other Ambulatory Visit (HOSPITAL_COMMUNITY): Payer: Medicare HMO

## 2021-01-28 NOTE — Progress Notes (Signed)
[facility] HEMATOLOGY-ONCOLOGY TeleHEALTH VISIT PROGRESS NOTE   I connected with [name] on 01/30/21 at 11:30 AM EDT by telephone and verified that I am speaking with the correct person using two identifiers.  I discussed the limitations, risks, security and privacy concerns of performing an evaluation and management service by telemedicine and the availability of in-person appointments. I also discussed with the patient that there may be a patient responsible charge related to this service. The patient expressed understanding and agreed to proceed.  Other persons participating in the visit and their role in the encounter: Chastity LPN  Patient's location: [place] Provider's location: [place] OFFICE PROGRESS NOTE  Chelsea Labrum, MD [place] [address]  DIAGNOSIS: Follow-up for breast cancer stage IA ER positive, left breast, diagnosed in July 2020  Oncology History  Malignant neoplasm of lower-inner quadrant of left breast in female, estrogen receptor positive ([place])  03/03/2019 Cancer Staging   Staging form: Breast, AJCC 8th Edition - Pathologic stage from 03/03/2019: Stage IA (pT1b, pN0, cM0, G1, ER+, PR-, HER2-) - Signed by Gardenia Phlegm, NP on 03/11/2019   03/11/2019 Initial Diagnosis   Malignant neoplasm of lower-inner quadrant of left breast in female, estrogen receptor positive ([place])     PRIOR THERAPY: Left lumpectomy by Dr. Donne Hazel on 03/03/2019, pathology showing 0.8 cm grade 1 invasive ductal carcinoma, arising in a complex sclerosing lesion, free margins. ER 100% positive, PR 0% negative, HER-2 negative. Ki-67 5%.  CURRENT THERAPY: Anastrozole was started on 04/02/2019  INTERVAL HISTORY: [name] 75 y.o. female returns to clinic today for a follow-up visit.  The patient was last seen in the clinic in November 2021.  The patient is currently taking anastrozole  which was started in August 2020.  She is tolerating this well without any concerning adverse side effects such as hot flashes or bone pain.  She is also taking vitamin D supplementation.  Overall, the patient is feeling well.  She has been doing water aerobics 3x per week. She denies any new breast lumps or lymphadenopathy. She saw a her radiation doctor in [place] recently who performed a physical exam without any concerning signs of recurrence.  She denies any fevers, chills, night sweats, or unexplained weight loss.  She denies any recent infections or hospitalizations.  She denies any new bone pain.  The patient recently had a mammogram performed.  The patient is here today for evaluation, to review her repeat lab work, and mammogram results.   MEDICAL HISTORY: Past Medical History:  Diagnosis Date  . Aortic stenosis   . Arthritis   . AV block, 1st degree   . Back pain    occasionally with weather changes  . CAD (coronary artery disease)    Non-STEMI/Taxus stenting 100% left anterior descending (2), January 2008. Residual 70% circumflex;80% right coronary artery. Mild LVD (EF 45%);improved to 55-60% by 2-D echocardiogram,January 2009.   . Carotid artery occlusion   . DM (diabetes mellitus) ([place])    takes Metformin 1054m BID  . Dyslipidemia    takes Pravastatin daily  . Early cataracts, bilateral   . Gout    takes Allopurinol daily  . Herniated disc    left  . Lumbar herniated disc   . Myocardial infarction ([facility] 2008   pt has 2 stents  . Nocturia   . Obesity   . Unspecified essential hypertension    takes  Metoprolol,Lisinopril,and Amlodipine  . Urinary frequency     ALLERGIES:  has No Known Allergies.  MEDICATIONS:  Current Outpatient Medications  Medication Sig Dispense Refill  . acetaminophen (TYLENOL) 500 MG tablet Take 500 mg by mouth 2 (two) times daily as needed for moderate pain or headache.     . allopurinol (ZYLOPRIM) 100 MG tablet Take 100 mg by mouth at bedtime.     [name] amLODipine (NORVASC) 10 MG tablet TAKE 1 TABLET EVERY DAY  *DOSE  INCREASE* (Patient taking differently: Take 10 mg by mouth daily.) 90 tablet 3  . anastrozole (ARIMIDEX) 1 MG tablet TAKE 1 TABLET BY MOUTH EVERY DAY 90 tablet 2  . Ascorbic Acid (VITAMIN C) 500 MG PACK Take by mouth.    [name] aspirin 81 MG EC tablet Take by mouth.    [name] atorvastatin (LIPITOR) 40 MG tablet Take 1 tablet by mouth daily.    . Cholecalciferol (VITAMIN D) 50 MCG (2000 UT) tablet Take 2,000 Units by mouth daily.    . colchicine 0.6 MG tablet Take one tablet PO and may repeat in one hour if no improvement. Then take one tablet every 3 hours until pain is relieved. 30 tablet 0  . fluticasone (FLONASE) 50 MCG/ACT nasal spray     . lisinopril (PRINIVIL,ZESTRIL) 40 MG tablet Take 1 tablet (40 mg total) by mouth daily. 90 tablet 3  . metFORMIN (GLUCOPHAGE) 500 MG tablet Take 500 mg by mouth 2 (two) times daily with a meal.     . metoprolol tartrate (LOPRESSOR) 50 MG tablet Take 1 tablet (50 mg total) by mouth 2 (two) times daily. 180 tablet 3  . pravastatin (PRAVACHOL) 40 MG tablet Take 40 mg by mouth at bedtime.     [name] tiZANidine (ZANAFLEX) 4 MG tablet Take 4 mg by mouth at bedtime as needed for muscle spasms.  0  . vitamin B-12 (CYANOCOBALAMIN) 500 MCG tablet Take 500 mcg by mouth daily.    . nitroGLYCERIN (NITROSTAT) 0.4 MG SL tablet PLACE 1 TABLET  UNDER THE TONGUE EVERY 5 MINUTES AS NEEDED FOR CHEST PAIN. (Patient not taking: Reported on 01/30/2021) 25 tablet 3   No current facility-administered medications for this visit.    SURGICAL HISTORY:  Past Surgical History:  Procedure Laterality Date  . ABDOMINAL HYSTERECTOMY    . BACK SURGERY  2009/2010/2011  . BREAST BIOPSY  unknown   left  . BREAST LUMPECTOMY WITH RADIOACTIVE SEED LOCALIZATION Left 03/03/2019   Procedure: LEFT BREAST LUMPECTOMY WITH RADIOACTIVE SEED LOCALIZATION;  Surgeon: Rolm Bookbinder, MD;  Location: [facility];  Service: General;   Laterality: Left;  . CARDIAC CATHETERIZATION  2008   NON-STEMI/TAXUS STENTING 100% PROXIMAL LAD (X2) JANUARY 2008  . CARDIAC CATHETERIZATION  2012   occluded LAD stents, Om1 : 70%, RCA: 80%, Ef 40-45%  . CARPAL TUNNEL RELEASE     bilateral  . CATARACT EXTRACTION W/PHACO Right 10/11/2017   Procedure: CATARACT EXTRACTION PHACO AND INTRAOCULAR LENS PLACEMENT RIGHT EYE;  Surgeon: Baruch Goldmann, MD;  Location: [facility];  Service: Ophthalmology;  Laterality: Right;  CDE: 5.17  . CATARACT EXTRACTION W/PHACO Left 12/27/2017   Procedure: CATARACT EXTRACTION PHACO AND INTRAOCULAR LENS PLACEMENT (IOC);  Surgeon: Baruch Goldmann, MD;  Location: [facility];  Service: Ophthalmology;  Laterality: Left;  CDE: 2.99  . CHOLECYSTECTOMY  not sure  . COLONOSCOPY    . CORONARY ARTERY BYPASS GRAFT  09/05/2011   Procedure: CORONARY ARTERY BYPASS GRAFTING (CABG);  Surgeon: Grace Isaac, MD;  Location: [facility];  Service: Open Heart Surgery;  Laterality: N/A;  CABG times three using left internal mammary artery and left leg greater saphenous vein harvested endoscopically  . LEG TENDON SURGERY  unknown   left   . LUMBAR LAMINECTOMY/DECOMPRESSION MICRODISCECTOMY Left 06/04/2014   Procedure: LUMBAR LAMINECTOMY/DECOMPRESSION MICRODISCECTOMY LUMBAR FOUR-FIVE;  Surgeon: Elaina Hoops, MD;  Location: [facility];  Service: Neurosurgery;  Laterality: Left;  [name] MASS EXCISION Left 08/08/2018   Procedure: EXCISION 5CM LIPOMA ON BACK;  Surgeon: Virl Cagey, MD;  Location: [facility];  Service: General;  Laterality: Left;  . TONSILLECTOMY      REVIEW OF SYSTEMS:   Review of Systems  Constitutional: Negative for appetite change, chills, fatigue, fever and unexpected weight change.  HENT:   Negative for mouth sores, nosebleeds, sore throat and trouble swallowing.   Eyes: Negative for eye problems and icterus.  Respiratory: Negative for cough, hemoptysis, shortness of breath and wheezing.   Cardiovascular: Negative for chest pain and  leg swelling.  Gastrointestinal: Negative for abdominal pain, constipation, diarrhea, nausea and vomiting.  Genitourinary: Negative for bladder incontinence, difficulty urinating, dysuria, frequency and hematuria.   Musculoskeletal: Negative for back pain, gait problem, neck pain and neck stiffness.  Skin: Negative for itching and rash.  Neurological: Negative for dizziness, extremity weakness, gait problem, headaches, light-headedness and seizures.  Hematological: Negative for adenopathy. Does not bruise/bleed easily.  Psychiatric/Behavioral: Negative for confusion, depression and sleep disturbance. The patient is not nervous/anxious.     PHYSICAL EXAMINATION:  Blood pressure (!) 162/61, pulse 71, temperature 97.8 F (36.6 C), temperature source Oral, resp. rate 18, weight 212 lb 6.4 oz (96.3 kg), SpO2 100 %.  ECOG PERFORMANCE STATUS: 1 - Symptomatic but completely ambulatory  Physical Exam  Constitutional: Oriented to person, place, and time and well-developed, well-nourished, and in no distress. No distress.  Psychiatric: Mood, memory and judgment normal.  Vitals reviewed.  LABORATORY DATA: Lab Results  Component Value Date   WBC 6.4 01/20/2021   HGB 12.3 01/20/2021   HCT 36.9 01/20/2021   MCV 91.6 01/20/2021   PLT 260 01/20/2021      Chemistry      Component Value Date/Time   NA 138 01/20/2021 0952   NA 145 09/19/2010 1152   K 3.9 01/20/2021 0952   K 4.1 09/19/2010 1152   CL 105 01/20/2021 0952   CL 98 09/19/2010 1152   CO2 23 01/20/2021 0952   CO2 30 09/19/2010 1152   BUN 21 01/20/2021 0952   BUN 19 09/19/2010 1152   CREATININE 1.13 (H) 01/20/2021 0952   CREATININE 1.3 (H) 09/19/2010 1152      Component Value Date/Time   CALCIUM 9.1 01/20/2021 0952   CALCIUM 9.1 09/19/2010 1152   ALKPHOS 50 01/20/2021 0952   ALKPHOS 48 09/19/2010 1152   AST 24 01/20/2021 0952   AST 23 09/19/2010 1152   ALT 27 01/20/2021 0952   ALT 22 09/19/2010 1152   BILITOT 1.1  01/20/2021 0952   BILITOT 0.60 09/19/2010 1152       RADIOGRAPHIC STUDIES:  No results found.   ASSESSMENT & PLAN:  1.Stage Ia left breast IDC: -Abnormal mammogram followed by left breast biopsy on 11/04/2018 showed small intraductal papilloma with usual ductal hyperplasia, no evidence of malignancy. -Left lumpectomy by Dr. Donne Hazel on 03/03/2019, pathology showing 0.8 cm grade 1 invasive ductal carcinoma, arising in a complex sclerosing lesion, free margins. ER 100% positive, PR 0% negative, HER-2 negative. Ki-67 5%. -She was  referred to Dr. Francesca Jewett. No radiation therapy was recommended. -Anastrozole was started on 04/02/2019. She denies any hot flashes or musculoskeletal symptoms. -She recently had a mammogram on 12/13/20 which was negative for signs of recurrence.  -We will see her back in 6  months with repeat labs.   2.Bone health: -DEXA scan on 03/17/2019 showed T score of -0.2 which is normal range. -She is scheduled for her next DEXA scan in March 20, 2021.  -Her vitamin D level was initially low.  Vitamin D supplementation was increased.  Labs from 01/20/21 show a vitamin D level of 46.55.  -Continue supplementation as prescribed. -We will recheck at her next visit. -Encouraged to continue staying active and to continue water aerobics.  The patient was advised to call immediately if she has any concerning symptoms in the interval. The patient voices understanding of current disease status and treatment options and is in agreement with the current care plan. All questions were answered. The patient knows to call the clinic with any problems, questions or concerns. We can certainly see the patient much sooner if necessary   I discussed the assessment and treatment plan with the patient. The patient was provided an opportunity to ask questions and all were answered. The patient agreed with the plan and demonstrated an understanding of the instructions.  The patient was  advised to call back or seek an in-person evaluation if the symptoms worsen or if the condition fails to improve as anticipated.  I provided 20-29 minutes of non face-to-face telephone visit time during this encounter, and > 50% was spent counseling as documented under my assessment & plan.  [place], PA-C 01/30/2021 11:53 AM  Orders Placed This Encounter  Procedures  . CBC with Differential    Standing Status:   Future    Standing Expiration Date:   01/30/2022  . Comprehensive metabolic panel    Standing Status:   Future    Standing Expiration Date:   01/30/2022  . Vitamin D 25 hydroxy    Standing Status:   Future    Standing Expiration Date:   01/30/2022    Order Specific Question:   Release to patient    Answer:   Immediate  . CBC with Differential (Cancer Center Only)    Standing Status:   Future    Standing Expiration Date:   01/30/2022  . CMP ([facility] only)    Standing Status:   Future    Standing Expiration Date:   01/30/2022  . Vitamin D 25 hydroxy    Standing Status:   Future    Standing Expiration Date:   01/30/2022     Jazalynn Mireles L Jossiah Smoak, PA-C 01/30/21

## 2021-01-30 ENCOUNTER — Inpatient Hospital Stay: Payer: Medicare HMO | Attending: Hematology | Admitting: Physician Assistant

## 2021-01-30 VITALS — BP 162/61 | HR 71 | Temp 97.8°F | Resp 18 | Wt 212.4 lb

## 2021-01-30 DIAGNOSIS — Z170 Estrogen receptor positive status [ER+]: Secondary | ICD-10-CM

## 2021-01-30 DIAGNOSIS — C50312 Malignant neoplasm of lower-inner quadrant of left female breast: Secondary | ICD-10-CM | POA: Diagnosis not present

## 2021-01-30 NOTE — Patient Instructions (Signed)
[facility] at [facility] Discharge Instructions  You were seen today by Cassie, PA. Cassie reviewed your recent lab work, your lab work looks good today. You were advised to proceed with your scheduled bone density scan and annual mammograms. Please continue taking your Vitamin D and Anastrazole as prescribed.  Follow-up as scheduled.   Thank you for choosing [place] at [facility] to provide your oncology and hematology care.  To afford each patient quality time with our provider, please arrive at least 15 minutes before your scheduled appointment time.   If you have a lab appointment with the [facility] please come in thru the Main Entrance and check in at the main information desk.  You need to re-schedule your appointment should you arrive 10 or more minutes late.  We strive to give you quality time with our providers, and arriving late affects you and other patients whose appointments are after yours.  Also, if you no show three or more times for appointments you may be dismissed from the clinic at the providers discretion.     Again, thank you for choosing [facility].  Our hope is that these requests will decrease the amount of time that you wait before being seen by our physicians.       _____________________________________________________________  Should you have questions after your visit to [facility], please contact our office at [phone] and follow the prompts.  Our office hours are 8:00 a.m. and 4:30 p.m. Monday - Friday.  Please note that voicemails left after 4:00 p.m. may not be returned until the following business day.  We are closed weekends and major holidays.  You do have access to a nurse 24-7, just call the main number to the clinic [phone] and do not press any options, hold on the line and a nurse will answer the phone.    For prescription refill requests, have your pharmacy contact  our office and allow 72 hours.    Due to Covid, you will need to wear a mask upon entering the hospital. If you do not have a mask, a mask will be given to you at the Main Entrance upon arrival. For doctor visits, patients may have 1 support person age 32 or older with them. For treatment visits, patients can not have anyone with them due to social distancing guidelines and our immunocompromised population.

## 2021-02-07 DIAGNOSIS — M109 Gout, unspecified: Secondary | ICD-10-CM | POA: Diagnosis not present

## 2021-02-07 DIAGNOSIS — I2510 Atherosclerotic heart disease of native coronary artery without angina pectoris: Secondary | ICD-10-CM | POA: Diagnosis not present

## 2021-02-07 DIAGNOSIS — E785 Hyperlipidemia, unspecified: Secondary | ICD-10-CM | POA: Diagnosis not present

## 2021-02-07 DIAGNOSIS — G8929 Other chronic pain: Secondary | ICD-10-CM | POA: Diagnosis not present

## 2021-02-07 DIAGNOSIS — I252 Old myocardial infarction: Secondary | ICD-10-CM | POA: Diagnosis not present

## 2021-02-07 DIAGNOSIS — E1165 Type 2 diabetes mellitus with hyperglycemia: Secondary | ICD-10-CM | POA: Diagnosis not present

## 2021-02-07 DIAGNOSIS — C50919 Malignant neoplasm of unspecified site of unspecified female breast: Secondary | ICD-10-CM | POA: Diagnosis not present

## 2021-02-07 DIAGNOSIS — M545 Low back pain, unspecified: Secondary | ICD-10-CM | POA: Diagnosis not present

## 2021-02-07 DIAGNOSIS — M1990 Unspecified osteoarthritis, unspecified site: Secondary | ICD-10-CM | POA: Diagnosis not present

## 2021-02-09 DIAGNOSIS — R768 Other specified abnormal immunological findings in serum: Secondary | ICD-10-CM | POA: Diagnosis not present

## 2021-02-09 DIAGNOSIS — N281 Cyst of kidney, acquired: Secondary | ICD-10-CM | POA: Diagnosis not present

## 2021-02-09 DIAGNOSIS — I129 Hypertensive chronic kidney disease with stage 1 through stage 4 chronic kidney disease, or unspecified chronic kidney disease: Secondary | ICD-10-CM | POA: Diagnosis not present

## 2021-02-09 DIAGNOSIS — E1122 Type 2 diabetes mellitus with diabetic chronic kidney disease: Secondary | ICD-10-CM | POA: Diagnosis not present

## 2021-02-09 DIAGNOSIS — N189 Chronic kidney disease, unspecified: Secondary | ICD-10-CM | POA: Diagnosis not present

## 2021-02-23 DIAGNOSIS — E1122 Type 2 diabetes mellitus with diabetic chronic kidney disease: Secondary | ICD-10-CM | POA: Diagnosis not present

## 2021-02-23 DIAGNOSIS — N189 Chronic kidney disease, unspecified: Secondary | ICD-10-CM | POA: Diagnosis not present

## 2021-02-23 DIAGNOSIS — R768 Other specified abnormal immunological findings in serum: Secondary | ICD-10-CM | POA: Diagnosis not present

## 2021-02-23 DIAGNOSIS — I6523 Occlusion and stenosis of bilateral carotid arteries: Secondary | ICD-10-CM | POA: Diagnosis not present

## 2021-02-23 DIAGNOSIS — I129 Hypertensive chronic kidney disease with stage 1 through stage 4 chronic kidney disease, or unspecified chronic kidney disease: Secondary | ICD-10-CM | POA: Diagnosis not present

## 2021-02-23 DIAGNOSIS — E6609 Other obesity due to excess calories: Secondary | ICD-10-CM | POA: Diagnosis not present

## 2021-02-23 DIAGNOSIS — N182 Chronic kidney disease, stage 2 (mild): Secondary | ICD-10-CM | POA: Diagnosis not present

## 2021-03-06 DIAGNOSIS — E1165 Type 2 diabetes mellitus with hyperglycemia: Secondary | ICD-10-CM | POA: Diagnosis not present

## 2021-03-06 DIAGNOSIS — E1122 Type 2 diabetes mellitus with diabetic chronic kidney disease: Secondary | ICD-10-CM | POA: Diagnosis not present

## 2021-03-06 DIAGNOSIS — E782 Mixed hyperlipidemia: Secondary | ICD-10-CM | POA: Diagnosis not present

## 2021-03-06 DIAGNOSIS — Z1159 Encounter for screening for other viral diseases: Secondary | ICD-10-CM | POA: Diagnosis not present

## 2021-03-06 DIAGNOSIS — E7849 Other hyperlipidemia: Secondary | ICD-10-CM | POA: Diagnosis not present

## 2021-03-06 DIAGNOSIS — I10 Essential (primary) hypertension: Secondary | ICD-10-CM | POA: Diagnosis not present

## 2021-03-06 DIAGNOSIS — R5383 Other fatigue: Secondary | ICD-10-CM | POA: Diagnosis not present

## 2021-03-06 DIAGNOSIS — N182 Chronic kidney disease, stage 2 (mild): Secondary | ICD-10-CM | POA: Diagnosis not present

## 2021-03-06 DIAGNOSIS — M545 Low back pain, unspecified: Secondary | ICD-10-CM | POA: Diagnosis not present

## 2021-03-08 DIAGNOSIS — I10 Essential (primary) hypertension: Secondary | ICD-10-CM | POA: Diagnosis not present

## 2021-03-08 DIAGNOSIS — E1122 Type 2 diabetes mellitus with diabetic chronic kidney disease: Secondary | ICD-10-CM | POA: Diagnosis not present

## 2021-03-08 DIAGNOSIS — N182 Chronic kidney disease, stage 2 (mild): Secondary | ICD-10-CM | POA: Diagnosis not present

## 2021-03-08 DIAGNOSIS — M65331 Trigger finger, right middle finger: Secondary | ICD-10-CM | POA: Diagnosis not present

## 2021-03-08 DIAGNOSIS — E1151 Type 2 diabetes mellitus with diabetic peripheral angiopathy without gangrene: Secondary | ICD-10-CM | POA: Diagnosis not present

## 2021-03-08 DIAGNOSIS — Z955 Presence of coronary angioplasty implant and graft: Secondary | ICD-10-CM | POA: Diagnosis not present

## 2021-03-08 DIAGNOSIS — Z6837 Body mass index (BMI) 37.0-37.9, adult: Secondary | ICD-10-CM | POA: Diagnosis not present

## 2021-03-08 DIAGNOSIS — E7849 Other hyperlipidemia: Secondary | ICD-10-CM | POA: Diagnosis not present

## 2021-03-08 DIAGNOSIS — M654 Radial styloid tenosynovitis [de Quervain]: Secondary | ICD-10-CM | POA: Diagnosis not present

## 2021-03-08 DIAGNOSIS — I2510 Atherosclerotic heart disease of native coronary artery without angina pectoris: Secondary | ICD-10-CM | POA: Diagnosis not present

## 2021-03-09 ENCOUNTER — Emergency Department (HOSPITAL_COMMUNITY)
Admission: EM | Admit: 2021-03-09 | Discharge: 2021-03-09 | Disposition: A | Discharge status: Home / Self Care | Payer: Medicare HMO | Attending: Emergency Medicine | Admitting: Emergency Medicine

## 2021-03-09 ENCOUNTER — Encounter (HOSPITAL_COMMUNITY): Payer: Self-pay | Admitting: *Deleted

## 2021-03-09 ENCOUNTER — Emergency Department (HOSPITAL_COMMUNITY): Payer: Medicare HMO

## 2021-03-09 ENCOUNTER — Other Ambulatory Visit: Payer: Self-pay

## 2021-03-09 DIAGNOSIS — Z951 Presence of aortocoronary bypass graft: Secondary | ICD-10-CM | POA: Diagnosis not present

## 2021-03-09 DIAGNOSIS — S8991XA Unspecified injury of right lower leg, initial encounter: Secondary | ICD-10-CM | POA: Diagnosis not present

## 2021-03-09 DIAGNOSIS — W07XXXA Fall from chair, initial encounter: Secondary | ICD-10-CM | POA: Diagnosis not present

## 2021-03-09 DIAGNOSIS — I119 Hypertensive heart disease without heart failure: Secondary | ICD-10-CM | POA: Insufficient documentation

## 2021-03-09 DIAGNOSIS — Z7952 Long term (current) use of systemic steroids: Secondary | ICD-10-CM | POA: Diagnosis not present

## 2021-03-09 DIAGNOSIS — M1711 Unilateral primary osteoarthritis, right knee: Secondary | ICD-10-CM | POA: Diagnosis not present

## 2021-03-09 DIAGNOSIS — Z9889 Other specified postprocedural states: Secondary | ICD-10-CM | POA: Diagnosis not present

## 2021-03-09 DIAGNOSIS — Z7984 Long term (current) use of oral hypoglycemic drugs: Secondary | ICD-10-CM | POA: Insufficient documentation

## 2021-03-09 DIAGNOSIS — Z9012 Acquired absence of left breast and nipple: Secondary | ICD-10-CM | POA: Diagnosis not present

## 2021-03-09 DIAGNOSIS — E119 Type 2 diabetes mellitus without complications: Secondary | ICD-10-CM | POA: Diagnosis not present

## 2021-03-09 DIAGNOSIS — Z79899 Other long term (current) drug therapy: Secondary | ICD-10-CM | POA: Insufficient documentation

## 2021-03-09 DIAGNOSIS — I2510 Atherosclerotic heart disease of native coronary artery without angina pectoris: Secondary | ICD-10-CM | POA: Insufficient documentation

## 2021-03-09 NOTE — ED Provider Notes (Signed)
[facility] Provider Note   CSN: [id number] Arrival date & time: 03/09/21  1604     History Chief Complaint  Patient presents with   Knee Pain    [name] is a 75 y.o. female.   Knee Pain Associated symptoms: no fever       [name] is a 75 y.o. female, with a history of CAD, DM, dyslipidemia, MI, presenting to the ED with right knee pain beginning earlier today.  Patient states she went to get up from a recliner, thinks she twisted her knee "or something similar," and felt pain to the right lateral knee.  Increased pain with movement and palpation.  Patient denies instability, deformity, numbness, weakness, swelling, other injuries.   Past Medical History:  Diagnosis Date   Aortic stenosis    Arthritis    AV block, 1st degree    Back pain    occasionally with weather changes   CAD (coronary artery disease)    Non-STEMI/Taxus stenting 100% left anterior descending (2), January 2008. Residual 70% circumflex;80% right coronary artery. Mild LVD (EF 45%);improved to 55-60% by 2-D echocardiogram,January 2009.    Carotid artery occlusion    DM (diabetes mellitus) ([place])    takes Metformin 1000mg  BID   Dyslipidemia    takes Pravastatin daily   Early cataracts, bilateral    Gout    takes Allopurinol daily   Herniated disc    left   Lumbar herniated disc    Myocardial infarction [facility]) 2008   pt has 2 stents   Nocturia    Obesity    Unspecified essential hypertension    takes Metoprolol,Lisinopril,and Amlodipine   Urinary frequency     Patient Active Problem List   Diagnosis Date Noted   Malignant neoplasm of lower-inner quadrant of left breast in female, estrogen receptor positive ([place]) 03/11/2019   Lipoma of lower back 07/08/2018   HNP (herniated nucleus pulposus), lumbar 06/04/2014   Overweight(278.02) 12/03/2011   S/P CABG (coronary artery bypass graft) 09/05/2011   Essential hypertension    DM 08/24/2009   DYSLIPIDEMIA  08/24/2009   CORONARY ARTERY DISEASE, S/P PTCA 08/24/2009   LAMINECTOMY, HX OF 08/24/2009    Past Surgical History:  Procedure Laterality Date   ABDOMINAL HYSTERECTOMY     BACK SURGERY  2009/2010/2011   BREAST BIOPSY  unknown   left   BREAST LUMPECTOMY WITH RADIOACTIVE SEED LOCALIZATION Left 03/03/2019   Procedure: LEFT BREAST LUMPECTOMY WITH RADIOACTIVE SEED LOCALIZATION;  Surgeon: Rolm Bookbinder, MD;  Location: [place];  Service: General;  Laterality: Left;   CARDIAC CATHETERIZATION  2008   NON-STEMI/TAXUS STENTING 100% PROXIMAL LAD (X2) JANUARY 2008   CARDIAC CATHETERIZATION  2012   occluded LAD stents, Om1 : 70%, RCA: 80%, Ef 40-45%   CARPAL TUNNEL RELEASE     bilateral   CATARACT EXTRACTION W/PHACO Right 10/11/2017   Procedure: CATARACT EXTRACTION PHACO AND INTRAOCULAR LENS PLACEMENT RIGHT EYE;  Surgeon: Baruch Goldmann, MD;  [facility]: [facility];  Service: Ophthalmology;  Laterality: Right;  CDE: 5.17   CATARACT EXTRACTION W/PHACO Left 12/27/2017   Procedure: CATARACT EXTRACTION PHACO AND INTRAOCULAR LENS PLACEMENT (IOC);  Surgeon: Baruch Goldmann, MD;  Location: [facility];  Service: Ophthalmology;  Laterality: Left;  CDE: 2.99   CHOLECYSTECTOMY  not sure   COLONOSCOPY     CORONARY ARTERY BYPASS GRAFT  09/05/2011   Procedure: CORONARY ARTERY BYPASS GRAFTING (CABG);  Surgeon: Grace Isaac, MD;  Location: [place];  Service:  Open Heart Surgery;  Laterality: N/A;  CABG times three using left internal mammary artery and left leg greater saphenous vein harvested endoscopically   LEG TENDON SURGERY  unknown   left    LUMBAR LAMINECTOMY/DECOMPRESSION MICRODISCECTOMY Left 06/04/2014   Procedure: LUMBAR LAMINECTOMY/DECOMPRESSION MICRODISCECTOMY LUMBAR FOUR-FIVE;  Surgeon: Elaina Hoops, MD;  Location: [place] [facility];  Service: Neurosurgery;  Laterality: Left;   MASS EXCISION Left 08/08/2018   Procedure: EXCISION 5CM LIPOMA ON BACK;  Surgeon: Virl Cagey, MD;  Location: [facility];   Service: General;  Laterality: Left;   TONSILLECTOMY       OB History   No obstetric history on file.     Family History  Problem Relation Age of Onset   Hypertension Father    Heart attack Sister 32   Heart attack Brother 42   Dementia Mother    Lupus Brother    Heart attack Brother    Hypertension Brother    Anesthesia problems Neg Hx    Hypotension Neg Hx    Malignant hyperthermia Neg Hx    Pseudochol deficiency Neg Hx     Social History   Tobacco Use   Smoking status: Never   Smokeless tobacco: Never  Vaping Use   Vaping Use: Never used  Substance Use Topics   Alcohol use: No    Alcohol/week: 0.0 standard drinks   Drug use: No    Home Medications Prior to Admission medications   Medication Sig Start Date End Date Taking? Authorizing Provider  acetaminophen (TYLENOL) 500 MG tablet Take 500 mg by mouth 2 (two) times daily as needed for moderate pain or headache.     [provider]  allopurinol (ZYLOPRIM) 100 MG tablet Take 100 mg by mouth at bedtime.    [provider]  amLODipine (NORVASC) 10 MG tablet TAKE 1 TABLET EVERY DAY  *DOSE  INCREASE* Patient taking differently: Take 10 mg by mouth daily. 12/15/15   Arnoldo Lenis, MD  anastrozole (ARIMIDEX) 1 MG tablet TAKE 1 TABLET BY MOUTH EVERY DAY 12/01/20   Derek Jack, MD  Ascorbic Acid (VITAMIN C) 500 MG PACK Take by mouth.    [provider]  aspirin 81 MG EC tablet Take by mouth.    [provider]  atorvastatin (LIPITOR) 40 MG tablet Take 1 tablet by mouth daily. 11/30/20   [provider]  Cholecalciferol (VITAMIN D) 50 MCG (2000 UT) tablet Take 2,000 Units by mouth daily.    [provider]  colchicine 0.6 MG tablet Take one tablet PO and may repeat in one hour if no improvement. Then take one tablet every 3 hours until pain is relieved. 06/20/14   Ashley Murrain, NP  fluticasone Asencion Islam) 50 MCG/ACT nasal spray  05/10/20   [provider]   lisinopril (PRINIVIL,ZESTRIL) 40 MG tablet Take 1 tablet (40 mg total) by mouth daily. 10/23/11   Wellington Hampshire, MD  metFORMIN (GLUCOPHAGE) 500 MG tablet Take 500 mg by mouth 2 (two) times daily with a meal.     [provider]  metoprolol tartrate (LOPRESSOR) 50 MG tablet Take 1 tablet (50 mg total) by mouth 2 (two) times daily. 12/27/20   Arnoldo Lenis, MD  nitroGLYCERIN (NITROSTAT) 0.4 MG SL tablet PLACE 1 TABLET  UNDER THE TONGUE EVERY 5 MINUTES AS NEEDED FOR CHEST PAIN. Patient not taking: Reported on 01/30/2021 11/14/20   Arnoldo Lenis, MD  pravastatin (PRAVACHOL) 40 MG tablet Take 40 mg by mouth  at bedtime.  07/25/11   [provider]  tiZANidine (ZANAFLEX) 4 MG tablet Take 4 mg by mouth at bedtime as needed for muscle spasms. 09/17/17   [provider]  vitamin B-12 (CYANOCOBALAMIN) 500 MCG tablet Take 500 mcg by mouth daily.    [provider]    Allergies    Patient has no known allergies.  Review of Systems   Review of Systems  Constitutional:  Negative for fever.  Musculoskeletal:  Positive for arthralgias. Negative for joint swelling.  Neurological:  Negative for weakness and numbness.   Physical Exam Updated Vital Signs BP (!) 143/55 (BP Location: Right Arm)   Pulse 64   Temp 99 F (37.2 C) (Oral)   Resp 18   SpO2 97%   Physical Exam Vitals and nursing note reviewed.  Constitutional:      General: She is not in acute distress.    Appearance: Normal appearance. She is well-developed. She is not diaphoretic.  HENT:     Head: Normocephalic and atraumatic.  Eyes:     Conjunctiva/sclera: Conjunctivae normal.  Cardiovascular:     Rate and Rhythm: Normal rate and regular rhythm.     Pulses:          Posterior tibial pulses are 2+ on the right side.  Pulmonary:     Effort: Pulmonary effort is normal.  Musculoskeletal:     Cervical back: Neck supple.     Comments: Tenderness to the right lateral knee.  No noted swelling,  color abnormality, deformity, instability.  No abnormalities noted to the rest of the right lower extremity.  Skin:    General: Skin is warm and dry.     Capillary Refill: Capillary refill takes less than 2 seconds.     Coloration: Skin is not pale.  Neurological:     Mental Status: She is alert.     Comments: Sensation light touch grossly intact in the right lower extremity. Appropriate motor function intact with flexion and extension in the right knee and ankle.  Psychiatric:        Behavior: Behavior normal.    ED Results / Procedures / Treatments   Labs (all labs ordered are listed, but only abnormal results are displayed) Labs Reviewed - No data to display  EKG None  Radiology DG Knee Complete 4 Views Right  Result Date: 03/09/2021 CLINICAL DATA:  Popping sensation in right knee medially, initial encounter EXAM: RIGHT KNEE - COMPLETE 4+ VIEW COMPARISON:  None. FINDINGS: Tricompartmental degenerative changes are noted. No joint effusion is seen. No acute fracture or dislocation is noted. Postsurgical changes are noted in the medial right calf. IMPRESSION: Degenerative change without acute abnormality. Electronically Signed   By: Inez Catalina M.D.   On: 03/09/2021 17:31    Procedures Procedures   Medications Ordered in ED Medications - No data to display  ED Course  I have reviewed the triage vital signs and the nursing notes.  Pertinent labs & imaging results that were available during my care of the patient were reviewed by me and considered in my medical decision making (see chart for details).    MDM Rules/Calculators/A&P                          Patient presents with right knee pain beginning today. No findings concerning for neurovascular compromise. I personally reviewed the patient's imaging studies.  No acute abnormalities noted. Patient did not tolerate knee immobilizer.  Increased  comfort with Ace wrap.  Patient walks with a cane at baseline.  She  demonstrated steady ambulation here in the ED. Orthopedic follow-up. The patient was given instructions for home care as well as return precautions. Patient voices understanding of these instructions, accepts the plan, and is comfortable with discharge.  Findings and plan of care discussed with attending physician, Welford Roche, MD.    Final Clinical Impression(s) / ED Diagnoses Final diagnoses:  Injury of right knee, initial encounter    Rx / DC Orders ED Discharge Orders     None        Layla Maw 03/10/21 2002    Luna Fuse, MD 03/17/21 1128

## 2021-03-09 NOTE — Discharge Instructions (Signed)
You have been seen today for a knee injury. There were no acute abnormalities on the x-rays, including no sign of fracture or dislocation, however, there could be injuries to the soft tissues, such as the ligaments or tendons that are not seen on xrays. There could also be what are called occult fractures that are small fractures not seen on xray. Acetaminophen: May take acetaminophen (generic for Tylenol), as needed, for pain. Your daily total maximum amount of acetaminophen from all sources should be limited to 4000mg /day for persons without liver problems, or 2000mg /day for those with liver problems. Ice: May apply ice to the area over the next 24 hours for 15 minutes at a time to reduce swelling. Elevation: Keep the extremity elevated as often as possible to reduce pain and inflammation. Support: Wear the knee immobilizer for support and comfort. Wear this until pain resolves. You will be weight-bearing as tolerated, which means you can slowly start to put weight on the extremity and increase amount and frequency as pain allows. Follow up: If symptoms are improving, you may follow up with your primary care provider for any continued management. If symptoms are not starting to improve within a week, you should follow up with the orthopedic specialist within two weeks. Return: Return to the ED for numbness, weakness, increasing pain, overall worsening symptoms, loss of function, or if symptoms are not improving, you have tried to follow up with the orthopedic specialist, and have been unable to do so.  For prescription assistance, may try using prescription discount sites or apps, such as goodrx.com or Good Rx smart phone app.

## 2021-03-09 NOTE — ED Triage Notes (Signed)
Right knee pain , injured getting out of a recliner

## 2021-03-19 DIAGNOSIS — M25562 Pain in left knee: Secondary | ICD-10-CM | POA: Diagnosis not present

## 2021-03-19 DIAGNOSIS — S8002XA Contusion of left knee, initial encounter: Secondary | ICD-10-CM | POA: Diagnosis not present

## 2021-03-19 IMAGING — US US RENAL
1 series · 14 of 25 positions shown · non-contrast
Comparison: None.

CLINICAL DATA: History of chronic kidney disease.

EXAM:
RENAL / URINARY TRACT ULTRASOUND COMPLETE

[Series 1: us renal · 14 of 38 slices shown]
[im 1/38]
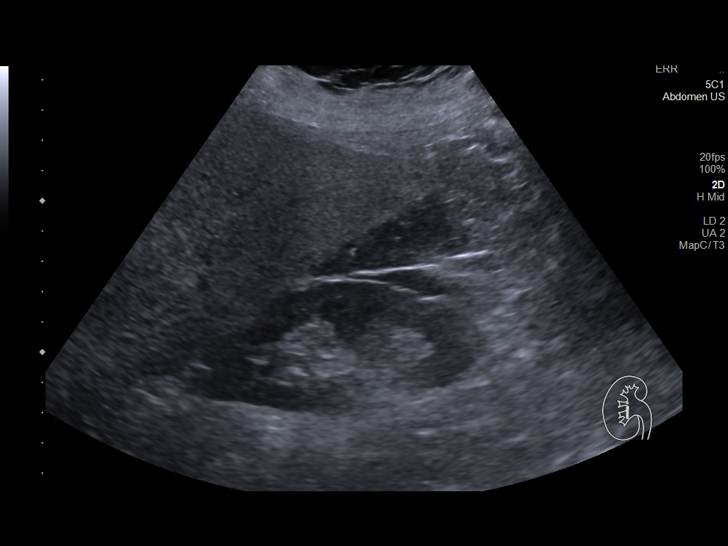
[im 4/38]
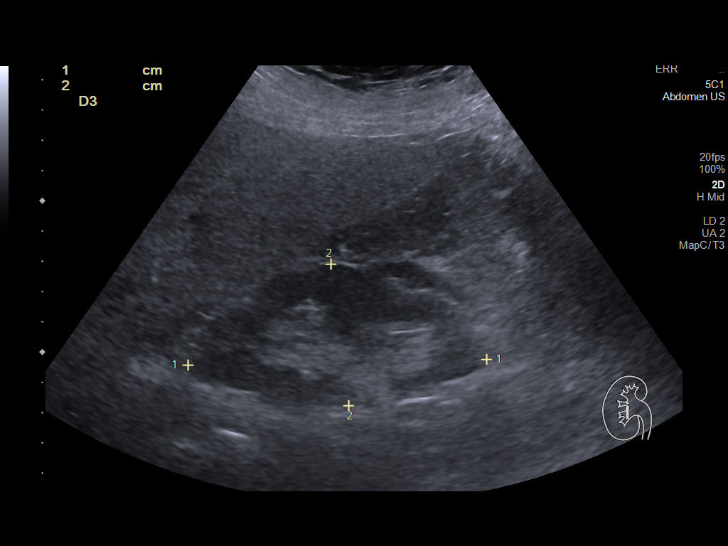
[im 7/38]
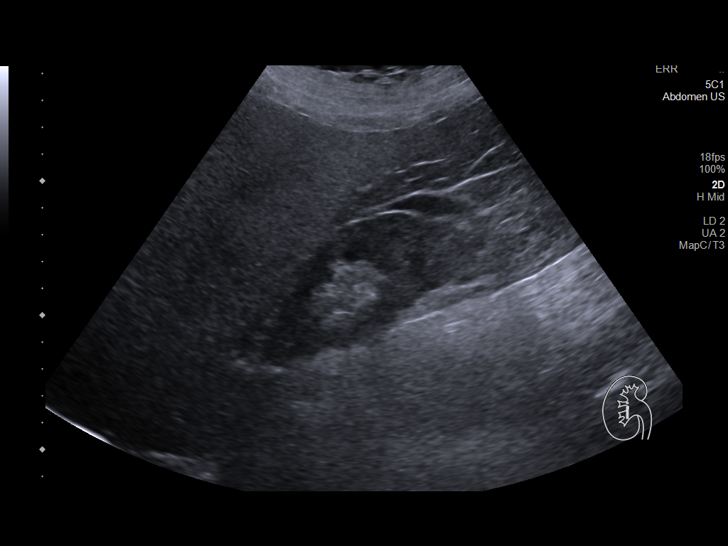
[im 10/38]
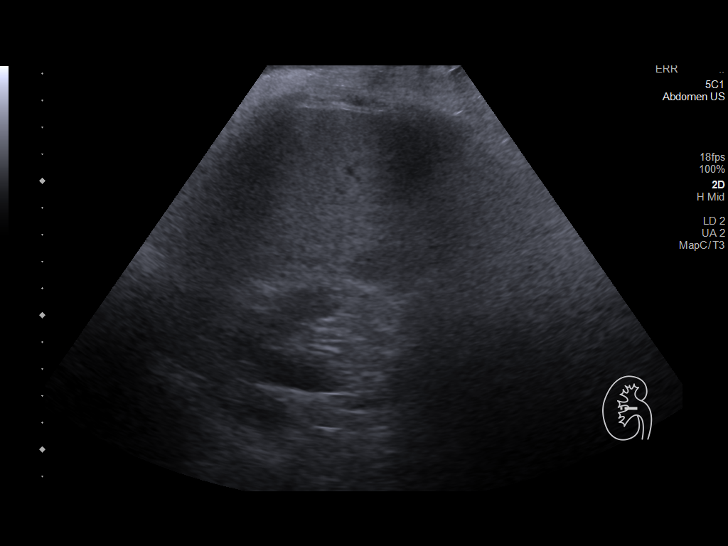
[im 13/38]
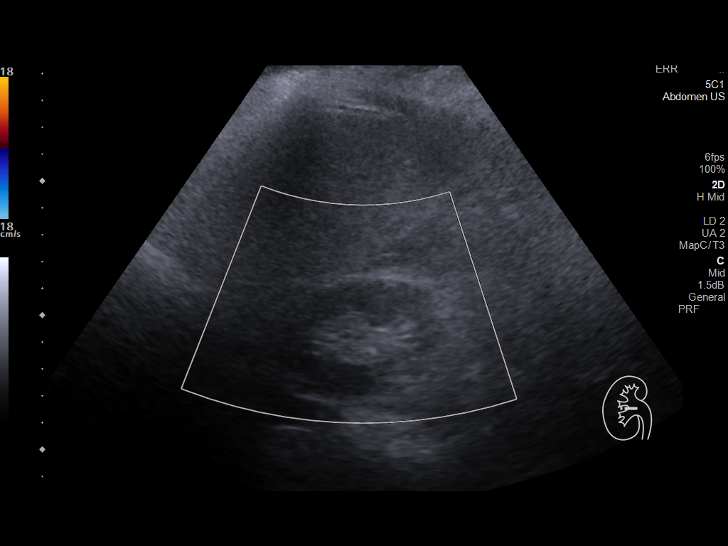
[im 14/38]
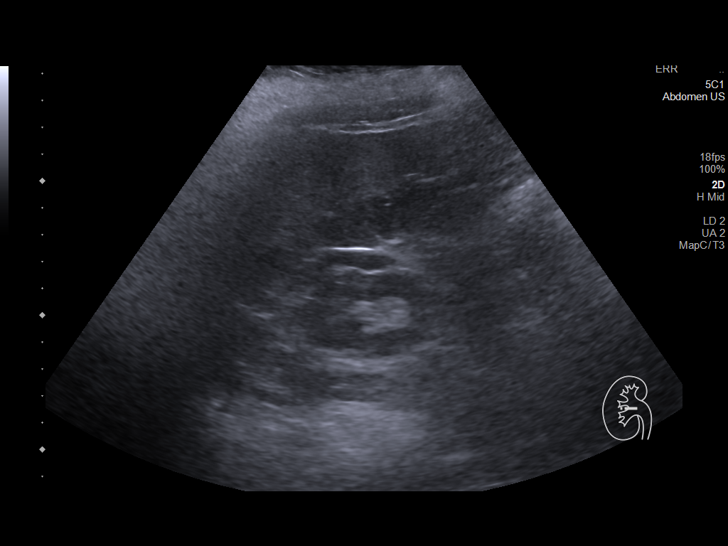
[im 17/38]
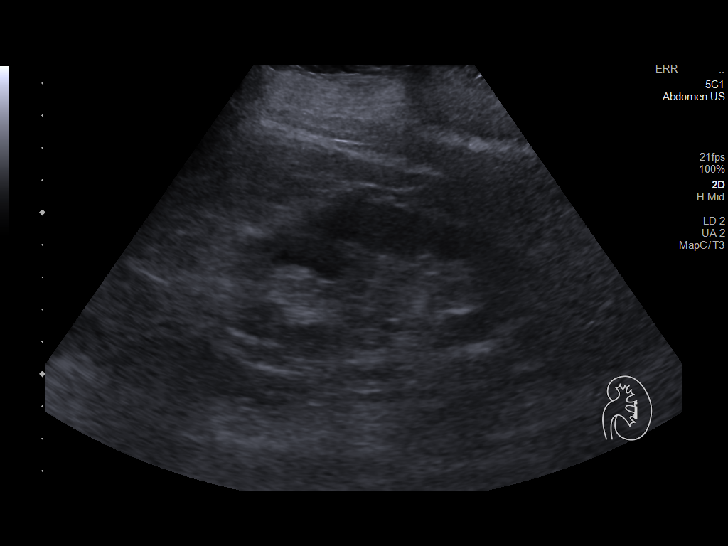
[im 21/38]
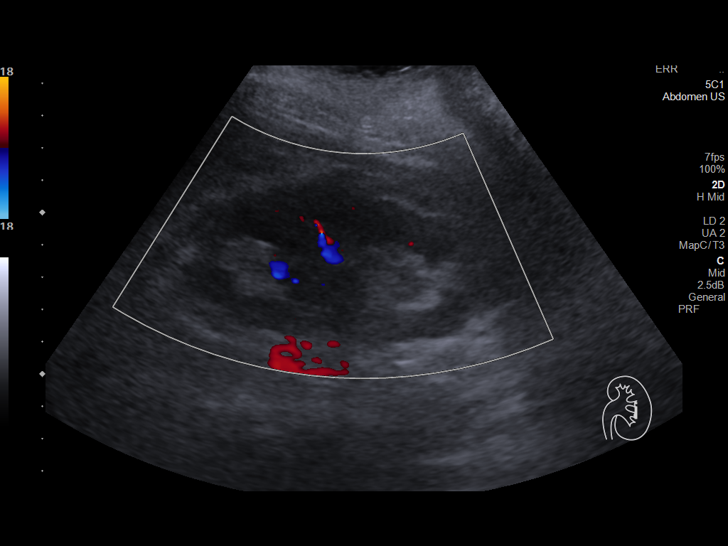
[im 24/38]
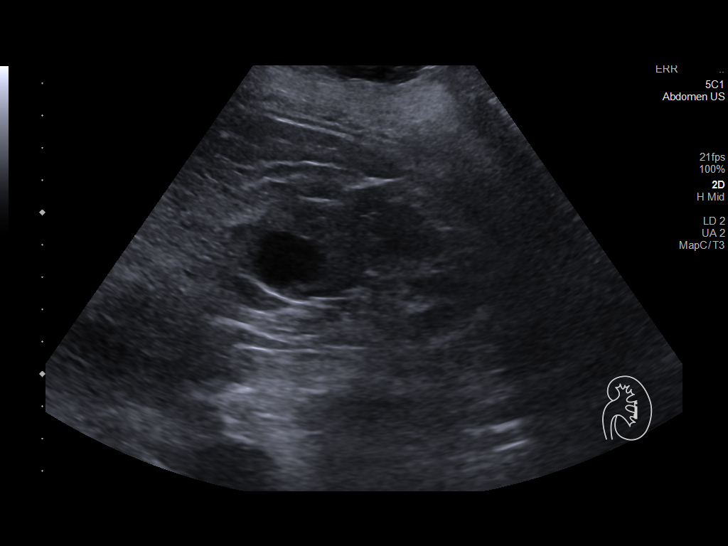
[im 25/38]
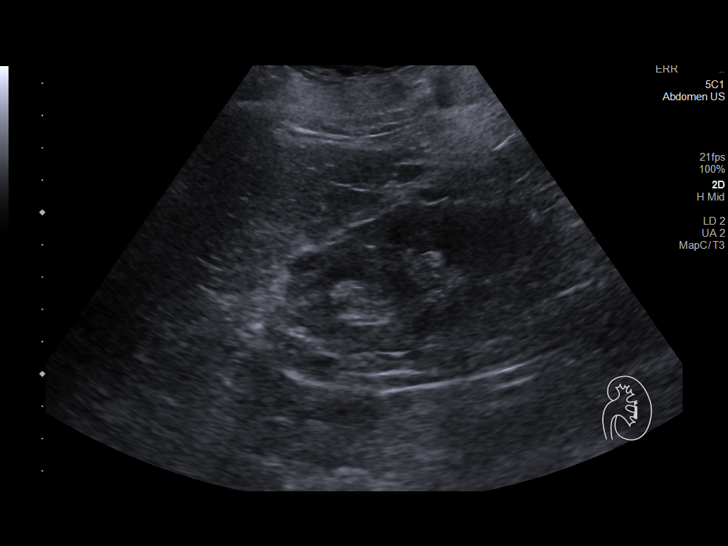
[im 28/38]
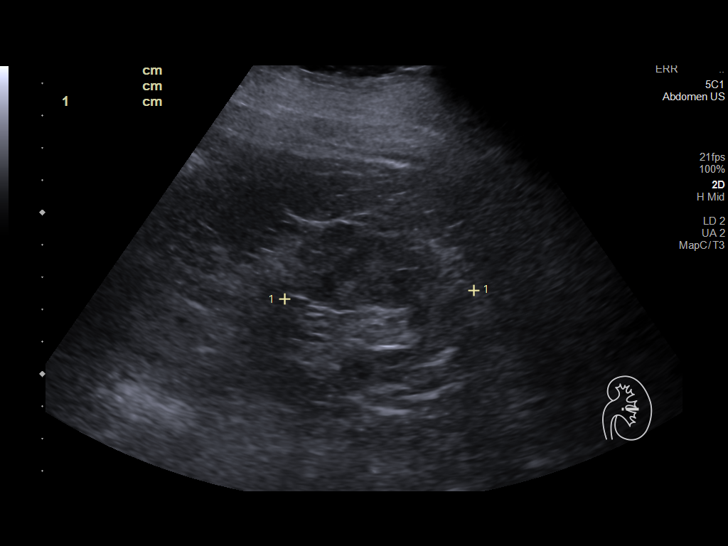
[im 31/38]
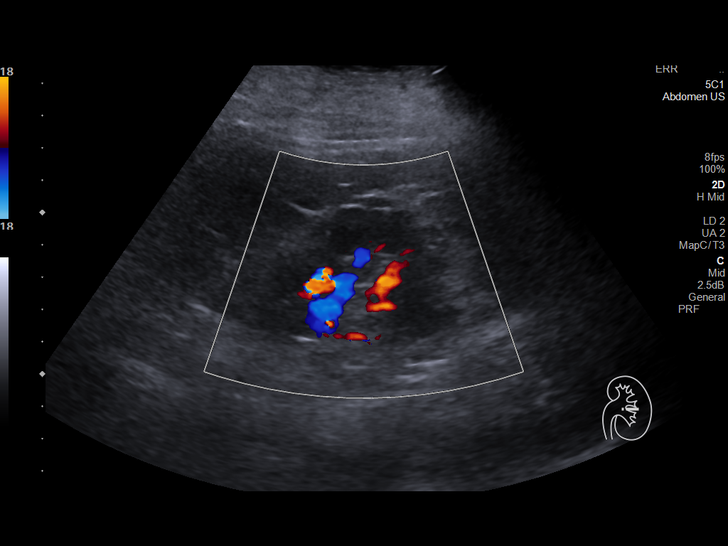
[im 34/38]
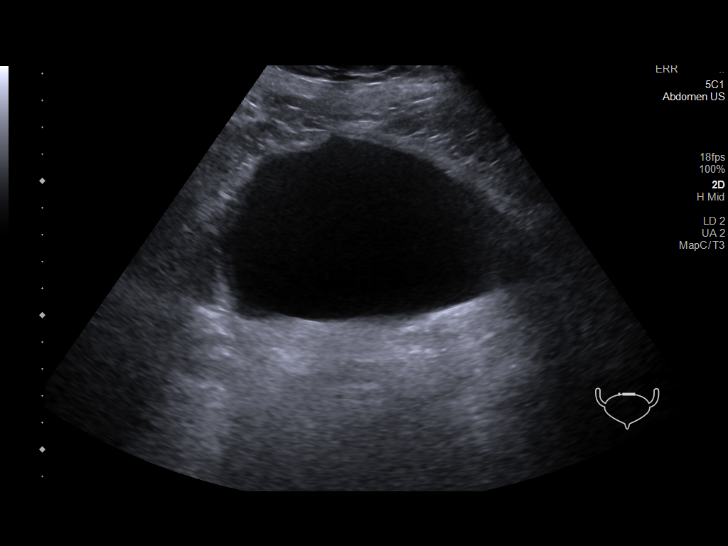
[im 38/38]
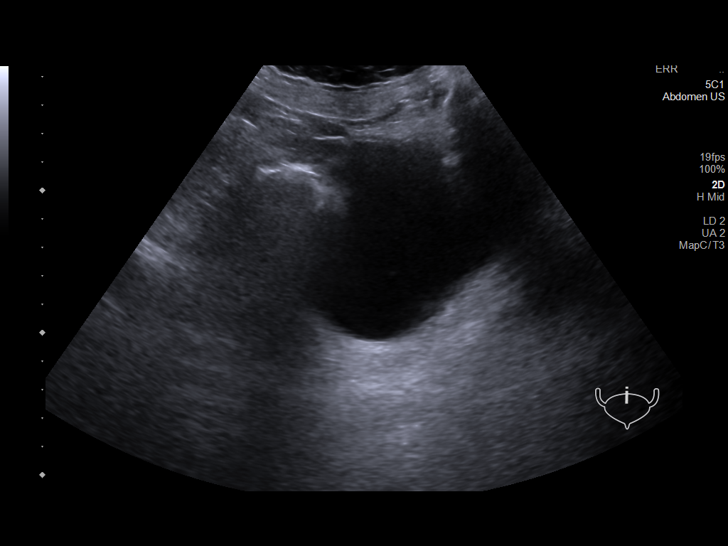

[14 of 25 positions shown; findings below may reference images not displayed]

FINDINGS: Right Kidney:

Renal measurements: 9.9 x 6.7 x 4.7 cm = volume: 162 mL.
Echogenicity within normal limits. No mass or hydronephrosis
visualized.

Left Kidney:

Renal measurements: 10 x 5.9 x 5.0 cm = volume: 153 mL. Echogenicity
within normal limits. Interpolar renal cyst which measures 2.1 cm.
No solid mass or hydronephrosis visualized.

Bladder:

Appears normal for degree of bladder distention.

Other:

Bilateral ureteral jets visualized.
IMPRESSION: No hydronephrosis.

## 2021-03-20 ENCOUNTER — Other Ambulatory Visit (HOSPITAL_COMMUNITY): Payer: Medicare HMO

## 2021-03-26 DIAGNOSIS — N182 Chronic kidney disease, stage 2 (mild): Secondary | ICD-10-CM | POA: Diagnosis not present

## 2021-03-26 DIAGNOSIS — E1122 Type 2 diabetes mellitus with diabetic chronic kidney disease: Secondary | ICD-10-CM | POA: Diagnosis not present

## 2021-03-26 DIAGNOSIS — I129 Hypertensive chronic kidney disease with stage 1 through stage 4 chronic kidney disease, or unspecified chronic kidney disease: Secondary | ICD-10-CM | POA: Diagnosis not present

## 2021-03-26 DIAGNOSIS — I6523 Occlusion and stenosis of bilateral carotid arteries: Secondary | ICD-10-CM | POA: Diagnosis not present

## 2021-04-26 DIAGNOSIS — E1122 Type 2 diabetes mellitus with diabetic chronic kidney disease: Secondary | ICD-10-CM | POA: Diagnosis not present

## 2021-04-26 DIAGNOSIS — N182 Chronic kidney disease, stage 2 (mild): Secondary | ICD-10-CM | POA: Diagnosis not present

## 2021-04-26 DIAGNOSIS — I129 Hypertensive chronic kidney disease with stage 1 through stage 4 chronic kidney disease, or unspecified chronic kidney disease: Secondary | ICD-10-CM | POA: Diagnosis not present

## 2021-04-26 DIAGNOSIS — I6523 Occlusion and stenosis of bilateral carotid arteries: Secondary | ICD-10-CM | POA: Diagnosis not present

## 2021-05-08 ENCOUNTER — Other Ambulatory Visit: Payer: Self-pay

## 2021-05-08 ENCOUNTER — Ambulatory Visit (HOSPITAL_COMMUNITY)
Admission: RE | Admit: 2021-05-08 | Discharge: 2021-05-08 | Disposition: A | Discharge status: Home / Self Care | Payer: Medicare HMO | Source: Ambulatory Visit | Attending: Oncology | Admitting: Oncology

## 2021-05-08 DIAGNOSIS — C50312 Malignant neoplasm of lower-inner quadrant of left female breast: Secondary | ICD-10-CM

## 2021-05-08 DIAGNOSIS — Z780 Asymptomatic menopausal state: Secondary | ICD-10-CM | POA: Insufficient documentation

## 2021-05-08 DIAGNOSIS — Z853 Personal history of malignant neoplasm of breast: Secondary | ICD-10-CM | POA: Insufficient documentation

## 2021-05-08 DIAGNOSIS — Z13820 Encounter for screening for osteoporosis: Secondary | ICD-10-CM | POA: Diagnosis not present

## 2021-05-08 DIAGNOSIS — Z170 Estrogen receptor positive status [ER+]: Secondary | ICD-10-CM | POA: Insufficient documentation

## 2021-05-18 DIAGNOSIS — I129 Hypertensive chronic kidney disease with stage 1 through stage 4 chronic kidney disease, or unspecified chronic kidney disease: Secondary | ICD-10-CM | POA: Diagnosis not present

## 2021-05-18 DIAGNOSIS — R768 Other specified abnormal immunological findings in serum: Secondary | ICD-10-CM | POA: Diagnosis not present

## 2021-05-18 DIAGNOSIS — E1122 Type 2 diabetes mellitus with diabetic chronic kidney disease: Secondary | ICD-10-CM | POA: Diagnosis not present

## 2021-05-18 DIAGNOSIS — N189 Chronic kidney disease, unspecified: Secondary | ICD-10-CM | POA: Diagnosis not present

## 2021-05-19 ENCOUNTER — Ambulatory Visit: Payer: Medicare HMO | Admitting: Cardiology

## 2021-05-31 DIAGNOSIS — E1122 Type 2 diabetes mellitus with diabetic chronic kidney disease: Secondary | ICD-10-CM | POA: Diagnosis not present

## 2021-05-31 DIAGNOSIS — I129 Hypertensive chronic kidney disease with stage 1 through stage 4 chronic kidney disease, or unspecified chronic kidney disease: Secondary | ICD-10-CM | POA: Diagnosis not present

## 2021-05-31 DIAGNOSIS — N189 Chronic kidney disease, unspecified: Secondary | ICD-10-CM | POA: Diagnosis not present

## 2021-05-31 DIAGNOSIS — E6609 Other obesity due to excess calories: Secondary | ICD-10-CM | POA: Diagnosis not present

## 2021-06-09 ENCOUNTER — Other Ambulatory Visit: Payer: Self-pay

## 2021-06-09 ENCOUNTER — Encounter: Payer: Self-pay | Admitting: Cardiology

## 2021-06-09 ENCOUNTER — Ambulatory Visit: Payer: Medicare HMO | Admitting: Cardiology

## 2021-06-09 VITALS — BP 128/72 | HR 64 | Ht 62.0 in | Wt 211.2 lb

## 2021-06-09 DIAGNOSIS — I10 Essential (primary) hypertension: Secondary | ICD-10-CM | POA: Diagnosis not present

## 2021-06-09 DIAGNOSIS — E782 Mixed hyperlipidemia: Secondary | ICD-10-CM | POA: Diagnosis not present

## 2021-06-09 DIAGNOSIS — I2510 Atherosclerotic heart disease of native coronary artery without angina pectoris: Secondary | ICD-10-CM | POA: Diagnosis not present

## 2021-06-09 NOTE — Progress Notes (Signed)
Clinical Summary [name] is a 75 y.o.femaleseen today for follow up of the following medical problems.      1. CAD   - CABG Jan 2013: 3 vessel (LIMA to LAD, SVG to OM, SVG to acute marginal).She had prior stenting as described below   - Jan 2013 echo LVEF 55-60%       No recent chest pain, no SOB/DOE - goes 3 days to water aerobics - compliant with meds       2. Hyperlipidemia   -upcoming labs with pcp - she is on atorvastain     3. HTN   - home bp's 120-130s/70s     4. PAD - followed by vascular. - due for follow up   5. CKD  - followed by Dr Theador Hawthorne     Past Medical History:  Diagnosis Date   Aortic stenosis    Arthritis    AV block, 1st degree    Back pain    occasionally with weather changes   CAD (coronary artery disease)    Non-STEMI/Taxus stenting 100% left anterior descending (2), January 2008. Residual 70% circumflex;80% right coronary artery. Mild LVD (EF 45%);improved to 55-60% by 2-D echocardiogram,January 2009.    Carotid artery occlusion    DM (diabetes mellitus) ([place])    takes Metformin 1000mg  BID   Dyslipidemia    takes Pravastatin daily   Early cataracts, bilateral    Gout    takes Allopurinol daily   Herniated disc    left   Lumbar herniated disc    Myocardial infarction [facility]) 2008   pt has 2 stents   Nocturia    Obesity    Unspecified essential hypertension    takes Metoprolol,Lisinopril,and Amlodipine   Urinary frequency      No Known Allergies   Current Outpatient Medications  Medication Sig Dispense Refill   acetaminophen (TYLENOL) 500 MG tablet Take 500 mg by mouth 2 (two) times daily as needed for moderate pain or headache.      allopurinol (ZYLOPRIM) 100 MG tablet Take 100 mg by mouth at bedtime.     amLODipine (NORVASC) 10 MG tablet TAKE 1 TABLET EVERY DAY  *DOSE  INCREASE* (Patient taking differently: Take 10 mg by mouth daily.) 90 tablet 3   anastrozole (ARIMIDEX) 1 MG tablet TAKE 1 TABLET BY MOUTH EVERY DAY  90 tablet 2   Ascorbic Acid (VITAMIN C) 500 MG PACK Take by mouth.     aspirin 81 MG EC tablet Take by mouth.     atorvastatin (LIPITOR) 40 MG tablet Take 1 tablet by mouth daily.     Cholecalciferol (VITAMIN D) 50 MCG (2000 UT) tablet Take 2,000 Units by mouth daily.     colchicine 0.6 MG tablet Take one tablet PO and may repeat in one hour if no improvement. Then take one tablet every 3 hours until pain is relieved. 30 tablet 0   fluticasone (FLONASE) 50 MCG/ACT nasal spray      lisinopril (PRINIVIL,ZESTRIL) 40 MG tablet Take 1 tablet (40 mg total) by mouth daily. 90 tablet 3   metFORMIN (GLUCOPHAGE) 500 MG tablet Take 500 mg by mouth 2 (two) times daily with a meal.      metoprolol tartrate (LOPRESSOR) 50 MG tablet Take 1 tablet (50 mg total) by mouth 2 (two) times daily. 180 tablet 3   nitroGLYCERIN (NITROSTAT) 0.4 MG SL tablet PLACE 1 TABLET  UNDER THE TONGUE EVERY 5 MINUTES AS NEEDED FOR CHEST PAIN. (Patient not  taking: Reported on 01/30/2021) 25 tablet 3   pravastatin (PRAVACHOL) 40 MG tablet Take 40 mg by mouth at bedtime.      tiZANidine (ZANAFLEX) 4 MG tablet Take 4 mg by mouth at bedtime as needed for muscle spasms.  0   vitamin B-12 (CYANOCOBALAMIN) 500 MCG tablet Take 500 mcg by mouth daily.     No current facility-administered medications for this visit.     Past Surgical History:  Procedure Laterality Date   ABDOMINAL HYSTERECTOMY     BACK SURGERY  2009/2010/2011   BREAST BIOPSY  unknown   left   BREAST LUMPECTOMY WITH RADIOACTIVE SEED LOCALIZATION Left 03/03/2019   Procedure: LEFT BREAST LUMPECTOMY WITH RADIOACTIVE SEED LOCALIZATION;  Surgeon: Rolm Bookbinder, MD;  Location: [place];  Service: General;  Laterality: Left;   CARDIAC CATHETERIZATION  2008   NON-STEMI/TAXUS STENTING 100% PROXIMAL LAD (X2) JANUARY 2008   CARDIAC CATHETERIZATION  2012   occluded LAD stents, Om1 : 70%, RCA: 80%, Ef 40-45%   CARPAL TUNNEL RELEASE     bilateral   CATARACT  EXTRACTION W/PHACO Right 10/11/2017   Procedure: CATARACT EXTRACTION PHACO AND INTRAOCULAR LENS PLACEMENT RIGHT EYE;  Surgeon: Baruch Goldmann, MD;  [facility];  Service: Ophthalmology;  Laterality: Right;  CDE: 5.17   CATARACT EXTRACTION W/PHACO Left 12/27/2017   Procedure: CATARACT EXTRACTION PHACO AND INTRAOCULAR LENS PLACEMENT (IOC);  Surgeon: Baruch Goldmann, MD;  [facility];  Service: Ophthalmology;  Laterality: Left;  CDE: 2.99   CHOLECYSTECTOMY  not sure   COLONOSCOPY     CORONARY ARTERY BYPASS GRAFT  09/05/2011   Procedure: CORONARY ARTERY BYPASS GRAFTING (CABG);  Surgeon: Grace Isaac, MD;  Location: [place];  Service: Open Heart Surgery;  Laterality: N/A;  CABG times three using left internal mammary artery and left leg greater saphenous vein harvested endoscopically   LEG TENDON SURGERY  unknown   left    LUMBAR LAMINECTOMY/DECOMPRESSION MICRODISCECTOMY Left 06/04/2014   Procedure: LUMBAR LAMINECTOMY/DECOMPRESSION MICRODISCECTOMY LUMBAR FOUR-FIVE;  Surgeon: Elaina Hoops, MD;  Location: [place] [facility];  Service: Neurosurgery;  Laterality: Left;   MASS EXCISION Left 08/08/2018   Procedure: EXCISION 5CM LIPOMA ON BACK;  Surgeon: Virl Cagey, MD;  [facility];  Service: General;  Laterality: Left;   TONSILLECTOMY       No Known Allergies    Family History  Problem Relation Age of Onset   Hypertension Father    Heart attack Sister 75   Heart attack Brother 13   Dementia Mother    Lupus Brother    Heart attack Brother    Hypertension Brother    Anesthesia problems Neg Hx    Hypotension Neg Hx    Malignant hyperthermia Neg Hx    Pseudochol deficiency Neg Hx      Social History [name] reports that she has never smoked. She has never used smokeless tobacco. [name] reports no history of alcohol use.   Review of Systems CONSTITUTIONAL: No weight loss, fever, chills, weakness or fatigue.  HEENT: Eyes: No visual loss, blurred vision, double vision  or yellow sclerae.No hearing loss, sneezing, congestion, runny nose or sore throat.  SKIN: No rash or itching.  CARDIOVASCULAR: per hpi RESPIRATORY: No shortness of breath, cough or sputum.  GASTROINTESTINAL: No anorexia, nausea, vomiting or diarrhea. No abdominal pain or blood.  GENITOURINARY: No burning on urination, no polyuria NEUROLOGICAL: No headache, dizziness, syncope, paralysis, ataxia, numbness or tingling in the extremities. No change in  bowel or bladder control.  MUSCULOSKELETAL: No muscle, back pain, joint pain or stiffness.  LYMPHATICS: No enlarged nodes. No history of splenectomy.  PSYCHIATRIC: No history of depression or anxiety.  ENDOCRINOLOGIC: No reports of sweating, cold or heat intolerance. No polyuria or polydipsia.  [name]   Physical Examination Today's Vitals   06/09/21 1443  BP: 128/72  Pulse: 64  SpO2: 98%  Weight: 211 lb 3.2 oz (95.8 kg)  Height: 5\' 2"  (1.575 m)   Body mass index is 38.63 kg/m.  Gen: resting comfortably, no acute distress HEENT: no scleral icterus, pupils equal round and reactive, no palptable cervical adenopathy,  CV: RRR, no m/r,g no jvd Resp: Clear to auscultation bilaterally GI: abdomen is soft, non-tender, non-distended, normal bowel sounds, no hepatosplenomegaly MSK: extremities are warm, no edema.  Skin: warm, no rash Neuro:  no focal deficits Psych: appropriate affect   Diagnostic Studies     Assessment and Plan  1. CAD   No recent symptoms, continue current meds   2. HTN   -bp at goal, continue current meds   3. Hyperlipidemia   - request pcp labs, continue atorvastatin  F/u 6 months      Arnoldo Lenis, M.D.

## 2021-06-09 NOTE — Patient Instructions (Signed)

## 2021-06-13 ENCOUNTER — Other Ambulatory Visit: Payer: Self-pay

## 2021-06-13 ENCOUNTER — Ambulatory Visit (HOSPITAL_COMMUNITY)
Admission: RE | Admit: 2021-06-13 | Discharge: 2021-06-13 | Disposition: A | Discharge status: Home / Self Care | Payer: Medicare HMO | Source: Ambulatory Visit | Attending: Vascular Surgery | Admitting: Vascular Surgery

## 2021-06-13 ENCOUNTER — Ambulatory Visit: Payer: Medicare HMO | Admitting: Physician Assistant

## 2021-06-13 VITALS — BP 155/68 | HR 61 | Temp 97.8°F | Resp 16 | Ht 62.0 in | Wt 212.0 lb

## 2021-06-13 DIAGNOSIS — I739 Peripheral vascular disease, unspecified: Secondary | ICD-10-CM | POA: Insufficient documentation

## 2021-06-13 DIAGNOSIS — I6523 Occlusion and stenosis of bilateral carotid arteries: Secondary | ICD-10-CM | POA: Insufficient documentation

## 2021-06-13 NOTE — Progress Notes (Signed)
Office Note     CC:  follow up Requesting Provider:  [name], MD  HPI: [name] is a 75 y.o. (Jul 21, 1946) female who presents for follow up of peripheral artery disease and carotid stenosis. She has been followed for 60-79% right ICA stenosis and 1-39% left ICA stenosis as well as bilateral lower extremity claudication. Her symptoms overall have remained stable. She has no history of TIA or stroke. She reports no new neurological symptoms since her last visit. She denies any changes in vision, facial drooping, slurred speech, unilateral weakness or numbness. She does report that her legs seem to be more painful. She says they do not bother her at rest or when ambulating. She does water aerobics at the [facility] 3x/week and her legs do not hurt her during activity. Her pain is more when she is getting ready to lay down at night. She says it is improved with elevating. The pain is along the from of her legs as well as the calf. She says "its just a pain". She says it is different then cramps as she use to get those in her thighs. The pain does not wake her up from sleep. She has no non healing wounds. She denies any swelling. She does have history of saphenous vein harvest in BLE for triple coronary bypass.   The pt is on a statin for cholesterol management.  The pt is on a daily aspirin.   Other AC:  none The pt is on Hydralazine, BB, CCB , ACE for hypertension.   The pt is diabetic.   Tobacco hx:  never  Past Medical History:  Diagnosis Date   Aortic stenosis    Arthritis    AV block, 1st degree    Back pain    occasionally with weather changes   CAD (coronary artery disease)    Non-STEMI/Taxus stenting 100% left anterior descending (2), January 2008. Residual 70% circumflex;80% right coronary artery. Mild LVD (EF 45%);improved to 55-60% by 2-D echocardiogram,January 2009.    Carotid artery occlusion    DM (diabetes mellitus) ([place])    takes Metformin 1000mg  BID   Dyslipidemia     takes Pravastatin daily   Early cataracts, bilateral    Gout    takes Allopurinol daily   Herniated disc    left   Lumbar herniated disc    Myocardial infarction [facility]) 2008   pt has 2 stents   Nocturia    Obesity    Unspecified essential hypertension    takes Metoprolol,Lisinopril,and Amlodipine   Urinary frequency     Past Surgical History:  Procedure Laterality Date   ABDOMINAL HYSTERECTOMY     BACK SURGERY  2009/2010/2011   BREAST BIOPSY  unknown   left   BREAST LUMPECTOMY WITH RADIOACTIVE SEED LOCALIZATION Left 03/03/2019   Procedure: LEFT BREAST LUMPECTOMY WITH RADIOACTIVE SEED LOCALIZATION;  Surgeon: Rolm Bookbinder, MD;  Location: [facility];  Service: General;  Laterality: Left;   CARDIAC CATHETERIZATION  2008   NON-STEMI/TAXUS STENTING 100% PROXIMAL LAD (X2) JANUARY 2008   CARDIAC CATHETERIZATION  2012   occluded LAD stents, Om1 : 70%, RCA: 80%, Ef 40-45%   CARPAL TUNNEL RELEASE     bilateral   CATARACT EXTRACTION W/PHACO Right 10/11/2017   Procedure: CATARACT EXTRACTION PHACO AND INTRAOCULAR LENS PLACEMENT RIGHT EYE;  Surgeon: Baruch Goldmann, MD;  Location: [facility];  Service: Ophthalmology;  Laterality: Right;  CDE: 5.17   CATARACT EXTRACTION W/PHACO Left 12/27/2017   Procedure: CATARACT  EXTRACTION PHACO AND INTRAOCULAR LENS PLACEMENT (IOC);  Surgeon: Baruch Goldmann, MD;  Location: [facility];  Service: Ophthalmology;  Laterality: Left;  CDE: 2.99   CHOLECYSTECTOMY  not sure   COLONOSCOPY     CORONARY ARTERY BYPASS GRAFT  09/05/2011   Procedure: CORONARY ARTERY BYPASS GRAFTING (CABG);  Surgeon: Grace Isaac, MD;  Location: [facility];  Service: Open Heart Surgery;  Laterality: N/A;  CABG times three using left internal mammary artery and left leg greater saphenous vein harvested endoscopically   LEG TENDON SURGERY  unknown   left    LUMBAR LAMINECTOMY/DECOMPRESSION MICRODISCECTOMY Left 06/04/2014   Procedure: LUMBAR LAMINECTOMY/DECOMPRESSION MICRODISCECTOMY  LUMBAR FOUR-FIVE;  Surgeon: Elaina Hoops, MD;  Location: [facility];  Service: Neurosurgery;  Laterality: Left;   MASS EXCISION Left 08/08/2018   Procedure: EXCISION 5CM LIPOMA ON BACK;  Surgeon: Virl Cagey, MD;  Location: [facility];  Service: General;  Laterality: Left;   TONSILLECTOMY      Social History   Socioeconomic History   Marital status: Widowed    Spouse name: Not on file   Number of children: 2   Years of education: Not on file   Highest education level: Not on file  Occupational History   Not on file  Tobacco Use   Smoking status: Never   Smokeless tobacco: Never  Vaping Use   Vaping Use: Never used  Substance and Sexual Activity   Alcohol use: No    Alcohol/week: 0.0 standard drinks   Drug use: No   Sexual activity: Not Currently    Birth control/protection: Surgical  Other Topics Concern   Not on file  Social History Narrative   Not on file   Social Determinants of Health   Financial Resource Strain: Low Risk    Difficulty of Paying Living Expenses: Not hard at all  Food Insecurity: No Food Insecurity   Worried About Charity fundraiser in the Last Year: Never true   [place] in the Last Year: Never true  Transportation Needs: No Transportation Needs   Lack of Transportation (Medical): No   Lack of Transportation (Non-Medical): No  Physical Activity: Insufficiently Active   Days of Exercise per Week: 1 day   Minutes of Exercise per Session: 30 min  Stress: No Stress Concern Present   Feeling of Stress : Not at all  Social Connections: Moderately Isolated   Frequency of Communication with Friends and Family: More than three times a week   Frequency of Social Gatherings with Friends and Family: Three times a week   Attends Religious Services: More than 4 times per year   Active Member of Clubs or Organizations: No   Attends Archivist Meetings: Never   Marital Status: Widowed  Human resources officer Violence: Not At Risk   Fear of  Current or Ex-Partner: No   Emotionally Abused: No   Physically Abused: No   Sexually Abused: No    Family History  Problem Relation Age of Onset   Hypertension Father    Heart attack Sister 28   Heart attack Brother 25   Dementia Mother    Lupus Brother    Heart attack Brother    Hypertension Brother    Anesthesia problems Neg Hx    Hypotension Neg Hx    Malignant hyperthermia Neg Hx    Pseudochol deficiency Neg Hx     Current Outpatient Medications  Medication Sig Dispense Refill   acetaminophen (TYLENOL) 500 MG tablet Take  500 mg by mouth 2 (two) times daily as needed for moderate pain or headache.      allopurinol (ZYLOPRIM) 100 MG tablet Take 100 mg by mouth at bedtime.     amLODipine (NORVASC) 10 MG tablet TAKE 1 TABLET EVERY DAY  *DOSE  INCREASE* 90 tablet 3   anastrozole (ARIMIDEX) 1 MG tablet TAKE 1 TABLET BY MOUTH EVERY DAY 90 tablet 2   Ascorbic Acid (VITAMIN C) 500 MG PACK Take 1 packet by mouth daily.     aspirin 81 MG EC tablet Take by mouth.     atorvastatin (LIPITOR) 40 MG tablet Take 1 tablet by mouth daily.     Cholecalciferol (VITAMIN D) 50 MCG (2000 UT) tablet Take 2,000 Units by mouth daily.     colchicine 0.6 MG tablet Take one tablet PO and may repeat in one hour if no improvement. Then take one tablet every 3 hours until pain is relieved. 30 tablet 0   fluticasone (FLONASE) 50 MCG/ACT nasal spray Place 1 spray into both nostrils as needed for allergies or rhinitis.     hydrALAZINE (APRESOLINE) 25 MG tablet Take 25 mg by mouth 2 (two) times daily.     lisinopril (PRINIVIL,ZESTRIL) 40 MG tablet Take 1 tablet (40 mg total) by mouth daily. 90 tablet 3   metFORMIN (GLUCOPHAGE) 500 MG tablet Take 500 mg by mouth 2 (two) times daily with a meal.      metoprolol tartrate (LOPRESSOR) 50 MG tablet Take 1 tablet (50 mg total) by mouth 2 (two) times daily. 180 tablet 3   nitroGLYCERIN (NITROSTAT) 0.4 MG SL tablet Place 0.4 mg under the tongue every 5 (five) minutes as  needed for chest pain.     tiZANidine (ZANAFLEX) 4 MG tablet Take 4 mg by mouth at bedtime as needed for muscle spasms.  0   vitamin B-12 (CYANOCOBALAMIN) 500 MCG tablet Take 500 mcg by mouth daily.     No current facility-administered medications for this visit.    No Known Allergies   REVIEW OF SYSTEMS:  [X]  denotes positive finding, [ ]  denotes negative finding Cardiac  Comments:  Chest pain or chest pressure:    Shortness of breath upon exertion:    Short of breath when lying flat:    Irregular heart rhythm:        Vascular    Pain in calf, thigh, or hip brought on by ambulation:    Pain in feet at night that wakes you up from your sleep:     Blood clot in your veins:    Leg swelling:         Pulmonary    Oxygen at home:    Productive cough:     Wheezing:         Neurologic    Sudden weakness in arms or legs:     Sudden numbness in arms or legs:     Sudden onset of difficulty speaking or slurred speech:    Temporary loss of vision in one eye:     Problems with dizziness:         Gastrointestinal    Blood in stool:     Vomited blood:         Genitourinary    Burning when urinating:     Blood in urine:        Psychiatric    Major depression:         Hematologic    Bleeding problems:    Problems  with blood clotting too easily:        Skin    Rashes or ulcers:        Constitutional    Fever or chills:      PHYSICAL EXAMINATION:  Vitals:   06/13/21 1344  BP: (!) 155/68  Pulse: 61  Resp: 16  Temp: 97.8 F (36.6 C)  TempSrc: Temporal  SpO2: 99%  Weight: 212 lb (96.2 kg)  Height: 5\' 2"  (1.575 m)    General:  WDWN in NAD; vital signs documented above Gait: Normal, ambulates with cane HENT: WNL, normocephalic Pulmonary: normal non-labored breathing , without wheezing Cardiac: regular HR, without  Murmurs without carotid bruit Abdomen: obese, soft, NT, no masses Skin: without rashes Vascular Exam/Pulses:2+ radial pulses, 2+ femoral pulses,  distal pulses not palpable. Feet warm and well perfused Extremities: without ischemic changes, without Gangrene , without cellulitis; without open wounds;  Musculoskeletal: no muscle wasting or atrophy  Neurologic: A&O X 3;  No focal weakness or paresthesias are detected Psychiatric:  The pt has Normal affect.   Non-Invasive Vascular Imaging:  06/13/21 +-------+-----------+-----------+------------+------------+  ABI/TBIToday's ABIToday's TBIPrevious ABIPrevious TBI  +-------+-----------+-----------+------------+------------+  Right  0.84       0.56       0.90        0.62          +-------+-----------+-----------+------------+------------+  Left   0.69       0.42       0.62        0.49          +-------+-----------+-----------+------------+------------+   Summary:  Right Carotid: Velocities in the right ICA are consistent with a 60-79%                 stenosis.   Left Carotid: Velocities in the left ICA are consistent with a 1-39%  stenosis.   Vertebrals:  Bilateral vertebral arteries demonstrate antegrade flow.  Subclavians: Normal flow hemodynamics were seen in bilateral subclavian               arteries.   ASSESSMENT/PLAN:: 75 y.o. female here for follow up for peripheral artery disease and carotid stenosis. She has been followed for 60-79% right ICA stenosis and 1-39% left ICA stenosis as well as bilateral lower extremity claudication. Her symptoms overall have remained stable. She has no new neurological symptoms associated with her carotid stenosis. Her carotid duplex today is stable. Her ABIs also are essentially unchanged from her prior study. She does not clearly describe claudication or rest pain in her lower extremities. With stable ABIs I would recommend continued closer interval follow up.  - Congratulated her on her exercise regimen and encouraged her to continue it - She will continue her Aspirin and Statin - She will follow up in 6 months with repeat  carotid duplex and ABIs - she will follow up sooner if she has new or worsening symptoms   Karoline Caldwell, PA-C [facility] [phone]  Clinic MD:   Roxanne Mins

## 2021-06-16 ENCOUNTER — Other Ambulatory Visit: Payer: Self-pay

## 2021-06-16 DIAGNOSIS — I6523 Occlusion and stenosis of bilateral carotid arteries: Secondary | ICD-10-CM

## 2021-06-16 DIAGNOSIS — I739 Peripheral vascular disease, unspecified: Secondary | ICD-10-CM

## 2021-06-19 DIAGNOSIS — E1151 Type 2 diabetes mellitus with diabetic peripheral angiopathy without gangrene: Secondary | ICD-10-CM | POA: Diagnosis not present

## 2021-06-19 DIAGNOSIS — E1122 Type 2 diabetes mellitus with diabetic chronic kidney disease: Secondary | ICD-10-CM | POA: Diagnosis not present

## 2021-06-19 DIAGNOSIS — N182 Chronic kidney disease, stage 2 (mild): Secondary | ICD-10-CM | POA: Diagnosis not present

## 2021-06-19 DIAGNOSIS — E7849 Other hyperlipidemia: Secondary | ICD-10-CM | POA: Diagnosis not present

## 2021-06-19 DIAGNOSIS — E782 Mixed hyperlipidemia: Secondary | ICD-10-CM | POA: Diagnosis not present

## 2021-06-19 DIAGNOSIS — I10 Essential (primary) hypertension: Secondary | ICD-10-CM | POA: Diagnosis not present

## 2021-06-21 DIAGNOSIS — M654 Radial styloid tenosynovitis [de Quervain]: Secondary | ICD-10-CM | POA: Diagnosis not present

## 2021-06-21 DIAGNOSIS — I10 Essential (primary) hypertension: Secondary | ICD-10-CM | POA: Diagnosis not present

## 2021-06-21 DIAGNOSIS — E1122 Type 2 diabetes mellitus with diabetic chronic kidney disease: Secondary | ICD-10-CM | POA: Diagnosis not present

## 2021-06-21 DIAGNOSIS — E1151 Type 2 diabetes mellitus with diabetic peripheral angiopathy without gangrene: Secondary | ICD-10-CM | POA: Diagnosis not present

## 2021-06-21 DIAGNOSIS — Z0001 Encounter for general adult medical examination with abnormal findings: Secondary | ICD-10-CM | POA: Diagnosis not present

## 2021-06-21 DIAGNOSIS — I2510 Atherosclerotic heart disease of native coronary artery without angina pectoris: Secondary | ICD-10-CM | POA: Diagnosis not present

## 2021-06-21 DIAGNOSIS — Z955 Presence of coronary angioplasty implant and graft: Secondary | ICD-10-CM | POA: Diagnosis not present

## 2021-06-21 DIAGNOSIS — C50912 Malignant neoplasm of unspecified site of left female breast: Secondary | ICD-10-CM | POA: Diagnosis not present

## 2021-07-30 NOTE — Progress Notes (Deleted)
[facility] OFFICE PROGRESS NOTE  [place], Virgina Evener, MD [address] [place] [address]  DIAGNOSIS: ***  PRIOR THERAPY:  CURRENT THERAPY:  INTERVAL HISTORY: [name] 76 y.o. female returns for *** regular *** visit for followup of ***   MEDICAL HISTORY: Past Medical History:  Diagnosis Date   Aortic stenosis    Arthritis    AV block, 1st degree    Back pain    occasionally with weather changes   CAD (coronary artery disease)    Non-STEMI/Taxus stenting 100% left anterior descending (2), January 2008. Residual 70% circumflex;80% right coronary artery. Mild LVD (EF 45%);improved to 55-60% by 2-D echocardiogram,January 2009.    Carotid artery occlusion    DM (diabetes mellitus) ([place])    takes Metformin 1000mg  BID   Dyslipidemia    takes Pravastatin daily   Early cataracts, bilateral    Gout    takes Allopurinol daily   Herniated disc    left   Lumbar herniated disc    Myocardial infarction [facility]) 2008   pt has 2 stents   Nocturia    Obesity    Unspecified essential hypertension    takes Metoprolol,Lisinopril,and Amlodipine   Urinary frequency     ALLERGIES:  has No Known Allergies.  MEDICATIONS:  Current Outpatient Medications  Medication Sig Dispense Refill   acetaminophen (TYLENOL) 500 MG tablet Take 500 mg by mouth 2 (two) times daily as needed for moderate pain or headache.      allopurinol (ZYLOPRIM) 100 MG tablet Take 100 mg by mouth at bedtime.     amLODipine (NORVASC) 10 MG tablet TAKE 1 TABLET EVERY DAY  *DOSE  INCREASE* 90 tablet 3   anastrozole (ARIMIDEX) 1 MG tablet TAKE 1 TABLET BY MOUTH EVERY DAY 90 tablet 2   Ascorbic Acid (VITAMIN C) 500 MG PACK Take 1 packet by mouth daily.     aspirin 81 MG EC tablet Take by mouth.     atorvastatin (LIPITOR) 40 MG tablet Take 1 tablet by mouth daily.     Cholecalciferol (VITAMIN D) 50 MCG (2000 UT) tablet Take 2,000 Units by mouth daily.     colchicine 0.6 MG tablet Take one tablet PO and may  repeat in one hour if no improvement. Then take one tablet every 3 hours until pain is relieved. 30 tablet 0   fluticasone (FLONASE) 50 MCG/ACT nasal spray Place 1 spray into both nostrils as needed for allergies or rhinitis.     hydrALAZINE (APRESOLINE) 25 MG tablet Take 25 mg by mouth 2 (two) times daily.     lisinopril (PRINIVIL,ZESTRIL) 40 MG tablet Take 1 tablet (40 mg total) by mouth daily. 90 tablet 3   metFORMIN (GLUCOPHAGE) 500 MG tablet Take 500 mg by mouth 2 (two) times daily with a meal.      metoprolol tartrate (LOPRESSOR) 50 MG tablet Take 1 tablet (50 mg total) by mouth 2 (two) times daily. 180 tablet 3   nitroGLYCERIN (NITROSTAT) 0.4 MG SL tablet Place 0.4 mg under the tongue every 5 (five) minutes as needed for chest pain.     tiZANidine (ZANAFLEX) 4 MG tablet Take 4 mg by mouth at bedtime as needed for muscle spasms.  0   vitamin B-12 (CYANOCOBALAMIN) 500 MCG tablet Take 500 mcg by mouth daily.     No current facility-administered medications for this visit.    SURGICAL HISTORY:  Past Surgical History:  Procedure Laterality Date   ABDOMINAL HYSTERECTOMY  BACK SURGERY  2009/2010/2011   BREAST BIOPSY  unknown   left   BREAST LUMPECTOMY WITH RADIOACTIVE SEED LOCALIZATION Left 03/03/2019   Procedure: LEFT BREAST LUMPECTOMY WITH RADIOACTIVE SEED LOCALIZATION;  Surgeon: Rolm Bookbinder, MD;  Location: [facility];  Service: General;  Laterality: Left;   CARDIAC CATHETERIZATION  2008   NON-STEMI/TAXUS STENTING 100% PROXIMAL LAD (X2) JANUARY 2008   CARDIAC CATHETERIZATION  2012   occluded LAD stents, Om1 : 70%, RCA: 80%, Ef 40-45%   CARPAL TUNNEL RELEASE     bilateral   CATARACT EXTRACTION W/PHACO Right 10/11/2017   Procedure: CATARACT EXTRACTION PHACO AND INTRAOCULAR LENS PLACEMENT RIGHT EYE;  Surgeon: Baruch Goldmann, MD;  Location: [facility];  Service: Ophthalmology;  Laterality: Right;  CDE: 5.17   CATARACT EXTRACTION W/PHACO Left 12/27/2017   Procedure:  CATARACT EXTRACTION PHACO AND INTRAOCULAR LENS PLACEMENT (IOC);  Surgeon: Baruch Goldmann, MD;  [facility]: [facility];  Service: Ophthalmology;  Laterality: Left;  CDE: 2.99   CHOLECYSTECTOMY  not sure   COLONOSCOPY     CORONARY ARTERY BYPASS GRAFT  09/05/2011   Procedure: CORONARY ARTERY BYPASS GRAFTING (CABG);  Surgeon: Grace Isaac, MD;  Location: [facility];  Service: Open Heart Surgery;  Laterality: N/A;  CABG times three using left internal mammary artery and left leg greater saphenous vein harvested endoscopically   LEG TENDON SURGERY  unknown   left    LUMBAR LAMINECTOMY/DECOMPRESSION MICRODISCECTOMY Left 06/04/2014   Procedure: LUMBAR LAMINECTOMY/DECOMPRESSION MICRODISCECTOMY LUMBAR FOUR-FIVE;  Surgeon: Elaina Hoops, MD;  Location: [facility];  Service: Neurosurgery;  Laterality: Left;   MASS EXCISION Left 08/08/2018   Procedure: EXCISION 5CM LIPOMA ON BACK;  Surgeon: Virl Cagey, MD;  [facility];  Service: General;  Laterality: Left;   TONSILLECTOMY      REVIEW OF SYSTEMS:   Review of Systems  Constitutional: Negative for appetite change, chills, fatigue, fever and unexpected weight change.  HENT:   Negative for mouth sores, nosebleeds, sore throat and trouble swallowing.   Eyes: Negative for eye problems and icterus.  Respiratory: Negative for cough, hemoptysis, shortness of breath and wheezing.   Cardiovascular: Negative for chest pain and leg swelling.  Gastrointestinal: Negative for abdominal pain, constipation, diarrhea, nausea and vomiting.  Genitourinary: Negative for bladder incontinence, difficulty urinating, dysuria, frequency and hematuria.   Musculoskeletal: Negative for back pain, gait problem, neck pain and neck stiffness.  Skin: Negative for itching and rash.  Neurological: Negative for dizziness, extremity weakness, gait problem, headaches, light-headedness and seizures.  Hematological: Negative for adenopathy. Does not bruise/bleed easily.   Psychiatric/Behavioral: Negative for confusion, depression and sleep disturbance. The patient is not nervous/anxious.     PHYSICAL EXAMINATION:  There were no vitals taken for this visit.  ECOG PERFORMANCE STATUS: {CHL ONC ECOG [id number]  Physical Exam  Constitutional: Oriented to person, place, and time and well-developed, well-nourished, and in no distress. No distress.  HENT:  Head: Normocephalic and atraumatic.  Mouth/Throat: Oropharynx is clear and moist. No oropharyngeal exudate.  Eyes: Conjunctivae are normal. Right eye exhibits no discharge. Left eye exhibits no discharge. No scleral icterus.  Neck: Normal range of motion. Neck supple.  Cardiovascular: Normal rate, regular rhythm, normal heart sounds and intact distal pulses.   Pulmonary/Chest: Effort normal and breath sounds normal. No respiratory distress. No wheezes. No rales.  Abdominal: Soft. Bowel sounds are normal. Exhibits no distension and no mass. There is no tenderness.  Musculoskeletal: Normal range of motion. Exhibits no edema.  Lymphadenopathy:  No cervical adenopathy.  Neurological: Alert and oriented to person, place, and time. Exhibits normal muscle tone. Gait normal. Coordination normal.  Skin: Skin is warm and dry. No rash noted. Not diaphoretic. No erythema. No pallor.  Psychiatric: Mood, memory and judgment normal.  Vitals reviewed.  LABORATORY DATA: Lab Results  Component Value Date   WBC 6.4 01/20/2021   HGB 12.3 01/20/2021   HCT 36.9 01/20/2021   MCV 91.6 01/20/2021   PLT 260 01/20/2021      Chemistry      Component Value Date/Time   NA 138 01/20/2021 0952   NA 145 09/19/2010 1152   K 3.9 01/20/2021 0952   K 4.1 09/19/2010 1152   CL 105 01/20/2021 0952   CL 98 09/19/2010 1152   CO2 23 01/20/2021 0952   CO2 30 09/19/2010 1152   BUN 21 01/20/2021 0952   BUN 19 09/19/2010 1152   CREATININE 1.13 (H) 01/20/2021 0952   CREATININE 1.3 (H) 09/19/2010 1152      Component Value  Date/Time   CALCIUM 9.1 01/20/2021 0952   CALCIUM 9.1 09/19/2010 1152   ALKPHOS 50 01/20/2021 0952   ALKPHOS 48 09/19/2010 1152   AST 24 01/20/2021 0952   AST 23 09/19/2010 1152   ALT 27 01/20/2021 0952   ALT 22 09/19/2010 1152   BILITOT 1.1 01/20/2021 0952   BILITOT 0.60 09/19/2010 1152       RADIOGRAPHIC STUDIES:  No results found.   ASSESSMENT/PLAN:  No problem-specific Assessment & Plan notes found for this encounter.   No orders of the defined types were placed in this encounter.    I spent {CHL ONC TIME VISIT - NVBTY:[id number]} counseling the patient face to face. The total time spent in the appointment was {CHL ONC TIME VISIT - FSFSE:[id number]}.  Marylou Wages L Cait [place], PA-C 07/30/21

## 2021-07-31 DIAGNOSIS — H5212 Myopia, left eye: Secondary | ICD-10-CM | POA: Diagnosis not present

## 2021-07-31 DIAGNOSIS — Z0100 Encounter for examination of eyes and vision without abnormal findings: Secondary | ICD-10-CM | POA: Diagnosis not present

## 2021-08-01 ENCOUNTER — Other Ambulatory Visit: Payer: Self-pay

## 2021-08-01 ENCOUNTER — Inpatient Hospital Stay (HOSPITAL_COMMUNITY): Payer: Medicare HMO | Attending: Hematology

## 2021-08-01 DIAGNOSIS — Z170 Estrogen receptor positive status [ER+]: Secondary | ICD-10-CM | POA: Diagnosis present

## 2021-08-01 DIAGNOSIS — N189 Chronic kidney disease, unspecified: Secondary | ICD-10-CM | POA: Diagnosis not present

## 2021-08-01 DIAGNOSIS — Z8639 Personal history of other endocrine, nutritional and metabolic disease: Secondary | ICD-10-CM | POA: Insufficient documentation

## 2021-08-01 DIAGNOSIS — Z79811 Long term (current) use of aromatase inhibitors: Secondary | ICD-10-CM | POA: Diagnosis not present

## 2021-08-01 DIAGNOSIS — C50312 Malignant neoplasm of lower-inner quadrant of left female breast: Secondary | ICD-10-CM | POA: Diagnosis present

## 2021-08-01 DIAGNOSIS — R7989 Other specified abnormal findings of blood chemistry: Secondary | ICD-10-CM | POA: Insufficient documentation

## 2021-08-01 LAB — COMPREHENSIVE METABOLIC PANEL
ALT: 20 U/L (ref 0–44)
AST: 21 U/L (ref 15–41)
Albumin: 4 g/dL (ref 3.5–5.0)
Alkaline Phosphatase: 48 U/L (ref 38–126)
Anion gap: 10 (ref 5–15)
BUN: 19 mg/dL (ref 8–23)
CO2: 21 mmol/L — ABNORMAL LOW (ref 22–32)
Calcium: 8.9 mg/dL (ref 8.9–10.3)
Chloride: 107 mmol/L (ref 98–111)
Creatinine, Ser: 1.17 mg/dL — ABNORMAL HIGH (ref 0.44–1.00)
GFR, Estimated: 48 mL/min — ABNORMAL LOW (ref >60)
Glucose, Bld: 158 mg/dL — ABNORMAL HIGH (ref 70–99)
Potassium: 4.3 mmol/L (ref 3.5–5.1)
Sodium: 138 mmol/L (ref 135–145)
Total Bilirubin: 1 mg/dL (ref 0.3–1.2)
Total Protein: 6.9 g/dL (ref 6.5–8.1)

## 2021-08-01 LAB — CBC WITH DIFFERENTIAL/PLATELET
Abs Immature Granulocytes: 0.02 10*3/uL (ref 0.00–0.07)
Basophils Absolute: 0 10*3/uL (ref 0.0–0.1)
Basophils Relative: 0 %
Eosinophils Absolute: 0.2 10*3/uL (ref 0.0–0.5)
Eosinophils Relative: 3 %
HCT: 36 % (ref 36.0–46.0)
Hemoglobin: 11.3 g/dL — ABNORMAL LOW (ref 12.0–15.0)
Immature Granulocytes: 0 %
Lymphocytes Relative: 31 %
Lymphs Abs: 2.1 10*3/uL (ref 0.7–4.0)
MCH: 29.4 pg (ref 26.0–34.0)
MCHC: 31.4 g/dL (ref 30.0–36.0)
MCV: 93.5 fL (ref 80.0–100.0)
Monocytes Absolute: 0.5 10*3/uL (ref 0.1–1.0)
Monocytes Relative: 8 %
Neutro Abs: 3.9 10*3/uL (ref 1.7–7.7)
Neutrophils Relative %: 58 %
Platelets: 222 10*3/uL (ref 150–400)
RBC: 3.85 MIL/uL — ABNORMAL LOW (ref 3.87–5.11)
RDW: 14.4 % (ref 11.5–15.5)
WBC: 6.8 10*3/uL (ref 4.0–10.5)
nRBC: 0 % (ref 0.0–0.2)

## 2021-08-01 LAB — VITAMIN D 25 HYDROXY (VIT D DEFICIENCY, FRACTURES): Vit D, 25-Hydroxy: 47.61 ng/mL (ref 30–100)

## 2021-08-02 ENCOUNTER — Inpatient Hospital Stay: Payer: Medicare HMO

## 2021-08-02 ENCOUNTER — Inpatient Hospital Stay: Payer: Medicare HMO | Admitting: Physician Assistant

## 2021-08-07 NOTE — Progress Notes (Signed)
[place] [address] [place] [address]   Patient Care Team: [name], MD as PCP - General Branch, Alphonse Guild, MD as PCP - Cardiology (Cardiology) Kary Kos, MD (Neurosurgery) Grace Isaac, MD (Inactive) (Cardiothoracic Surgery)  SUMMARY OF ONCOLOGIC HISTORY: Oncology History  Malignant neoplasm of lower-inner quadrant of left breast in female, estrogen receptor positive ([place])  03/03/2019 Cancer Staging   Staging form: Breast, AJCC 8th Edition - Pathologic stage from 03/03/2019: Stage IA (pT1b, pN0, cM0, G1, ER+, PR-, HER2-) - Signed by [place] Phlegm, NP on 03/11/2019    03/11/2019 Initial Diagnosis   Malignant neoplasm of lower-inner quadrant of left breast in female, estrogen receptor positive ([place])     CHIEF COMPLIANT: Follow-up of breast cancer   INTERVAL HISTORY: [name] is a 75 y.o. female here today for follow up of her breast cancer. Her last visit was on 07/25/2020.  Today she reports feeling good. She is taking anastrozole and tolerating it well. She denies joint pains and hot flashes.   REVIEW OF SYSTEMS:   Review of Systems  Constitutional:  Negative for appetite change and fatigue (75%).  Endocrine: Negative for hot flashes.  Musculoskeletal:  Positive for back pain (8/10 chronic). Negative for arthralgias.  Psychiatric/Behavioral:  Positive for sleep disturbance.   All other systems reviewed and are negative.  I have reviewed the past medical history, past surgical history, social history and family history with the patient and they are unchanged from previous note.   ALLERGIES:   has No Known Allergies.   MEDICATIONS:  Current Outpatient Medications  Medication Sig Dispense Refill   acetaminophen (TYLENOL) 500 MG tablet Take 500 mg by mouth 2 (two) times daily as needed for moderate pain or headache.      allopurinol (ZYLOPRIM) 100 MG tablet Take 100 mg by mouth at bedtime.     amLODipine  (NORVASC) 10 MG tablet TAKE 1 TABLET EVERY DAY  *DOSE  INCREASE* 90 tablet 3   anastrozole (ARIMIDEX) 1 MG tablet TAKE 1 TABLET BY MOUTH EVERY DAY 90 tablet 2   Ascorbic Acid (VITAMIN C) 500 MG PACK Take 1 packet by mouth daily.     aspirin 81 MG EC tablet Take by mouth.     atorvastatin (LIPITOR) 40 MG tablet Take 1 tablet by mouth daily.     Cholecalciferol (VITAMIN D) 50 MCG (2000 UT) tablet Take 2,000 Units by mouth daily.     colchicine 0.6 MG tablet Take one tablet PO and may repeat in one hour if no improvement. Then take one tablet every 3 hours until pain is relieved. 30 tablet 0   fluticasone (FLONASE) 50 MCG/ACT nasal spray Place 1 spray into both nostrils as needed for allergies or rhinitis.     hydrALAZINE (APRESOLINE) 25 MG tablet Take 25 mg by mouth 2 (two) times daily.     lisinopril (PRINIVIL,ZESTRIL) 40 MG tablet Take 1 tablet (40 mg total) by mouth daily. 90 tablet 3   metFORMIN (GLUCOPHAGE) 500 MG tablet Take 500 mg by mouth 2 (two) times daily with a meal.      metoprolol tartrate (LOPRESSOR) 50 MG tablet Take 1 tablet (50 mg total) by mouth 2 (two) times daily. 180 tablet 3   nitroGLYCERIN (NITROSTAT) 0.4 MG SL tablet Place 0.4 mg under the tongue every 5 (five) minutes as needed for chest pain.     tiZANidine (ZANAFLEX) 4 MG tablet Take 4 mg by mouth at  bedtime as needed for muscle spasms.  0   vitamin B-12 (CYANOCOBALAMIN) 500 MCG tablet Take 500 mcg by mouth daily.     No current facility-administered medications for this visit.     PHYSICAL EXAMINATION: Performance status (ECOG): 1 - Symptomatic but completely ambulatory  There were no vitals filed for this visit. Wt Readings from Last 3 Encounters:  06/13/21 212 lb (96.2 kg)  06/09/21 211 lb 3.2 oz (95.8 kg)  01/30/21 212 lb 6.4 oz (96.3 kg)   Physical Exam Vitals reviewed.  Constitutional:      Appearance: Normal appearance.  Cardiovascular:     Rate and Rhythm: Normal rate and regular rhythm.      Pulses: Normal pulses.     Heart sounds: Normal heart sounds.  Pulmonary:     Effort: Pulmonary effort is normal.     Breath sounds: Normal breath sounds.  Chest:  Breasts:    Right: Normal. No swelling, bleeding, inverted nipple, mass, nipple discharge, skin change or tenderness.     Left: Normal. No swelling, bleeding, inverted nipple, mass, nipple discharge, skin change (lumpectomy scar well healed) or tenderness.  Abdominal:     Palpations: Abdomen is soft. There is no hepatomegaly, splenomegaly or mass.     Tenderness: There is no abdominal tenderness.  Musculoskeletal:     Right lower leg: No edema.     Left lower leg: No edema.  Lymphadenopathy:     Upper Body:     Right upper body: No supraclavicular, axillary or pectoral adenopathy.     Left upper body: No supraclavicular, axillary or pectoral adenopathy.  Neurological:     General: No focal deficit present.     Mental Status: She is alert and oriented to person, place, and time.  Psychiatric:        Mood and Affect: Mood normal.        Behavior: Behavior normal.    Breast Exam Chaperone: [name]     LABORATORY DATA:  I have reviewed the data as listed CMP Latest Ref Rng & Units 08/01/2021 01/20/2021 07/18/2020  Glucose 70 - 99 mg/dL 158(H) 154(H) 152(H)  BUN 8 - 23 mg/dL 19 21 25(H)  Creatinine 0.44 - 1.00 mg/dL 1.17(H) 1.13(H) 1.47(H)  Sodium 135 - 145 mmol/L 138 138 140  Potassium 3.5 - 5.1 mmol/L 4.3 3.9 4.1  Chloride 98 - 111 mmol/L 107 105 105  CO2 22 - 32 mmol/L 21(L) 23 23  Calcium 8.9 - 10.3 mg/dL 8.9 9.1 9.4  Total Protein 6.5 - 8.1 g/dL 6.9 7.0 7.3  Total Bilirubin 0.3 - 1.2 mg/dL 1.0 1.1 0.5  Alkaline Phos 38 - 126 U/L 48 50 46  AST 15 - 41 U/L _0 ALT 0 - 44 U/L _1 No results found for: OEV035 Lab Results  Component Value Date   WBC 6.8 08/01/2021   HGB 11.3 (L) 08/01/2021   HCT 36.0 08/01/2021   MCV 93.5 08/01/2021   PLT 222 08/01/2021   NEUTROABS 3.9 08/01/2021     ASSESSMENT:  1.  Stage Ia (PT1BPNX) left breast IDC: - Abnormal mammogram followed by left breast biopsy on 11/04/2018 showed small intraductal papilloma with usual ductal hyperplasia, no evidence of malignancy. - Left lumpectomy by Dr. Donne Hazel on 03/03/2019, pathology showing 0.8 cm grade 1 invasive ductal carcinoma, arising in a complex sclerosing lesion, free margins.  ER-100%, PR-0%, HER-2 negative.  Ki-67 5%. - She was referred to [facility].  No radiation  therapy was recommended. -Anastrozole was started on 04/02/2019.   2.  Bone health: - DEXA scan on 03/17/2019 shows T score of -0.2 which is in the normal range.    PLAN:  1.  Stage Ia left breast IDC: - She is tolerating anastrozole reasonably well. - Reviewed mammogram from 12/13/2020 which was BI-RADS Category 2. - Physical examination today did not reveal any palpable masses. - RTC 6 months for follow-up.  Mammogram in April 2023.   2.  Bone health: - Vitamin D is 47.6.  Continue calcium and vitamin D supplements. - Plan to repeat DEXA scan towards the end of 2023.    3.  Elevated creatinine: -She has mild CKD and is stable.   Breast Cancer therapy associated bone loss: I have recommended calcium, Vitamin D and weight bearing exercises.  Orders placed this encounter:  No orders of the defined types were placed in this encounter.   The patient has a good understanding of the overall plan. She agrees with it. She will call with any problems that may develop before the next visit here.  Chelsea Jack, MD [place] [phone]   I, [name], am acting as a scribe for Dr. Derek Bird.  I, Chelsea Jack MD, have reviewed the above documentation for accuracy and completeness, and I agree with the above.

## 2021-08-08 ENCOUNTER — Other Ambulatory Visit: Payer: Self-pay

## 2021-08-08 ENCOUNTER — Encounter (HOSPITAL_COMMUNITY): Payer: Self-pay | Admitting: Hematology

## 2021-08-08 ENCOUNTER — Inpatient Hospital Stay (HOSPITAL_BASED_OUTPATIENT_CLINIC_OR_DEPARTMENT_OTHER): Payer: Medicare HMO | Admitting: Hematology

## 2021-08-08 VITALS — BP 170/82 | HR 58 | Temp 97.9°F | Resp 18 | Ht 62.0 in | Wt 213.2 lb

## 2021-08-08 DIAGNOSIS — Z170 Estrogen receptor positive status [ER+]: Secondary | ICD-10-CM

## 2021-08-08 DIAGNOSIS — R7989 Other specified abnormal findings of blood chemistry: Secondary | ICD-10-CM | POA: Diagnosis not present

## 2021-08-08 DIAGNOSIS — C50312 Malignant neoplasm of lower-inner quadrant of left female breast: Secondary | ICD-10-CM

## 2021-08-08 DIAGNOSIS — N179 Acute kidney failure, unspecified: Secondary | ICD-10-CM

## 2021-08-08 DIAGNOSIS — Z79811 Long term (current) use of aromatase inhibitors: Secondary | ICD-10-CM | POA: Diagnosis not present

## 2021-08-08 DIAGNOSIS — N189 Chronic kidney disease, unspecified: Secondary | ICD-10-CM | POA: Diagnosis not present

## 2021-08-08 DIAGNOSIS — E559 Vitamin D deficiency, unspecified: Secondary | ICD-10-CM | POA: Diagnosis not present

## 2021-08-08 NOTE — Patient Instructions (Signed)
[place] at [facility] Discharge Instructions   You were seen and examined today by Dr. Delton Coombes. He reviewed your lab work, which was normal/stable.  Return as scheduled in 6 months for lab work and office visit.    Thank you for choosing [place] at [facility] to provide your oncology and hematology care.  To afford each patient quality time with our provider, please arrive at least 15 minutes before your scheduled appointment time.   If you have a lab appointment with the [facility] please come in thru the Main Entrance and check in at the main information desk.  You need to re-schedule your appointment should you arrive 10 or more minutes late.  We strive to give you quality time with our providers, and arriving late affects you and other patients whose appointments are after yours.  Also, if you no show three or more times for appointments you may be dismissed from the clinic at the providers discretion.     Again, thank you for choosing [facility].  Our hope is that these requests will decrease the amount of time that you wait before being seen by our physicians.       _____________________________________________________________  Should you have questions after your visit to [facility], please contact our office at [phone] and follow the prompts.  Our office hours are 8:00 a.m. and 4:30 p.m. Monday - Friday.  Please note that voicemails left after 4:00 p.m. may not be returned until the following business day.  We are closed weekends and major holidays.  You do have access to a nurse 24-7, just call the main number to the clinic [phone] and do not press any options, hold on the line and a nurse will answer the phone.    For prescription refill requests, have your pharmacy contact our office and allow 72 hours.    Due to Covid, you will need to wear a mask upon entering the hospital. If you do  not have a mask, a mask will be given to you at the Main Entrance upon arrival. For doctor visits, patients may have 1 support person age 59 or older with them. For treatment visits, patients can not have anyone with them due to social distancing guidelines and our immunocompromised population.

## 2021-08-24 ENCOUNTER — Other Ambulatory Visit (HOSPITAL_COMMUNITY): Payer: Self-pay | Admitting: Hematology

## 2021-08-24 DIAGNOSIS — E1122 Type 2 diabetes mellitus with diabetic chronic kidney disease: Secondary | ICD-10-CM | POA: Diagnosis not present

## 2021-08-24 DIAGNOSIS — E6609 Other obesity due to excess calories: Secondary | ICD-10-CM | POA: Diagnosis not present

## 2021-08-24 DIAGNOSIS — E559 Vitamin D deficiency, unspecified: Secondary | ICD-10-CM | POA: Diagnosis not present

## 2021-08-24 DIAGNOSIS — R768 Other specified abnormal immunological findings in serum: Secondary | ICD-10-CM | POA: Diagnosis not present

## 2021-08-24 DIAGNOSIS — N189 Chronic kidney disease, unspecified: Secondary | ICD-10-CM | POA: Diagnosis not present

## 2021-08-24 DIAGNOSIS — I129 Hypertensive chronic kidney disease with stage 1 through stage 4 chronic kidney disease, or unspecified chronic kidney disease: Secondary | ICD-10-CM | POA: Diagnosis not present

## 2021-08-24 DIAGNOSIS — Z170 Estrogen receptor positive status [ER+]: Secondary | ICD-10-CM

## 2021-08-27 DIAGNOSIS — C801 Malignant (primary) neoplasm, unspecified: Secondary | ICD-10-CM

## 2021-08-27 HISTORY — DX: Malignant (primary) neoplasm, unspecified: C80.1

## 2021-09-05 DIAGNOSIS — C50919 Malignant neoplasm of unspecified site of unspecified female breast: Secondary | ICD-10-CM | POA: Diagnosis not present

## 2021-09-05 DIAGNOSIS — E1151 Type 2 diabetes mellitus with diabetic peripheral angiopathy without gangrene: Secondary | ICD-10-CM | POA: Diagnosis not present

## 2021-09-05 DIAGNOSIS — Z008 Encounter for other general examination: Secondary | ICD-10-CM | POA: Diagnosis not present

## 2021-09-05 DIAGNOSIS — I110 Hypertensive heart disease with heart failure: Secondary | ICD-10-CM | POA: Diagnosis not present

## 2021-09-05 DIAGNOSIS — M109 Gout, unspecified: Secondary | ICD-10-CM | POA: Diagnosis not present

## 2021-09-05 DIAGNOSIS — E1165 Type 2 diabetes mellitus with hyperglycemia: Secondary | ICD-10-CM | POA: Diagnosis not present

## 2021-09-05 DIAGNOSIS — M1990 Unspecified osteoarthritis, unspecified site: Secondary | ICD-10-CM | POA: Diagnosis not present

## 2021-09-05 DIAGNOSIS — I2510 Atherosclerotic heart disease of native coronary artery without angina pectoris: Secondary | ICD-10-CM | POA: Diagnosis not present

## 2021-09-05 DIAGNOSIS — I509 Heart failure, unspecified: Secondary | ICD-10-CM | POA: Diagnosis not present

## 2021-09-05 DIAGNOSIS — G8929 Other chronic pain: Secondary | ICD-10-CM | POA: Diagnosis not present

## 2021-09-05 DIAGNOSIS — E785 Hyperlipidemia, unspecified: Secondary | ICD-10-CM | POA: Diagnosis not present

## 2021-09-05 DIAGNOSIS — I252 Old myocardial infarction: Secondary | ICD-10-CM | POA: Diagnosis not present

## 2021-09-12 DIAGNOSIS — H40023 Open angle with borderline findings, high risk, bilateral: Secondary | ICD-10-CM | POA: Diagnosis not present

## 2021-09-18 DIAGNOSIS — N183 Chronic kidney disease, stage 3 unspecified: Secondary | ICD-10-CM | POA: Diagnosis not present

## 2021-09-18 DIAGNOSIS — Z1329 Encounter for screening for other suspected endocrine disorder: Secondary | ICD-10-CM | POA: Diagnosis not present

## 2021-09-18 DIAGNOSIS — R5383 Other fatigue: Secondary | ICD-10-CM | POA: Diagnosis not present

## 2021-09-18 DIAGNOSIS — E7849 Other hyperlipidemia: Secondary | ICD-10-CM | POA: Diagnosis not present

## 2021-09-18 DIAGNOSIS — E782 Mixed hyperlipidemia: Secondary | ICD-10-CM | POA: Diagnosis not present

## 2021-09-18 DIAGNOSIS — I10 Essential (primary) hypertension: Secondary | ICD-10-CM | POA: Diagnosis not present

## 2021-09-18 DIAGNOSIS — E1122 Type 2 diabetes mellitus with diabetic chronic kidney disease: Secondary | ICD-10-CM | POA: Diagnosis not present

## 2021-09-19 DIAGNOSIS — N189 Chronic kidney disease, unspecified: Secondary | ICD-10-CM | POA: Diagnosis not present

## 2021-09-19 DIAGNOSIS — E1122 Type 2 diabetes mellitus with diabetic chronic kidney disease: Secondary | ICD-10-CM | POA: Diagnosis not present

## 2021-09-19 DIAGNOSIS — I129 Hypertensive chronic kidney disease with stage 1 through stage 4 chronic kidney disease, or unspecified chronic kidney disease: Secondary | ICD-10-CM | POA: Diagnosis not present

## 2021-09-19 DIAGNOSIS — E6609 Other obesity due to excess calories: Secondary | ICD-10-CM | POA: Diagnosis not present

## 2021-09-20 DIAGNOSIS — E1122 Type 2 diabetes mellitus with diabetic chronic kidney disease: Secondary | ICD-10-CM | POA: Diagnosis not present

## 2021-09-20 DIAGNOSIS — Z955 Presence of coronary angioplasty implant and graft: Secondary | ICD-10-CM | POA: Diagnosis not present

## 2021-09-20 DIAGNOSIS — I10 Essential (primary) hypertension: Secondary | ICD-10-CM | POA: Diagnosis not present

## 2021-09-20 DIAGNOSIS — I2510 Atherosclerotic heart disease of native coronary artery without angina pectoris: Secondary | ICD-10-CM | POA: Diagnosis not present

## 2021-09-20 DIAGNOSIS — E1151 Type 2 diabetes mellitus with diabetic peripheral angiopathy without gangrene: Secondary | ICD-10-CM | POA: Diagnosis not present

## 2021-09-20 DIAGNOSIS — M654 Radial styloid tenosynovitis [de Quervain]: Secondary | ICD-10-CM | POA: Diagnosis not present

## 2021-09-20 DIAGNOSIS — C50912 Malignant neoplasm of unspecified site of left female breast: Secondary | ICD-10-CM | POA: Diagnosis not present

## 2021-09-20 DIAGNOSIS — E7849 Other hyperlipidemia: Secondary | ICD-10-CM | POA: Diagnosis not present

## 2021-10-24 DIAGNOSIS — I129 Hypertensive chronic kidney disease with stage 1 through stage 4 chronic kidney disease, or unspecified chronic kidney disease: Secondary | ICD-10-CM | POA: Diagnosis not present

## 2021-10-24 DIAGNOSIS — E1122 Type 2 diabetes mellitus with diabetic chronic kidney disease: Secondary | ICD-10-CM | POA: Diagnosis not present

## 2021-12-05 DIAGNOSIS — E6609 Other obesity due to excess calories: Secondary | ICD-10-CM | POA: Diagnosis not present

## 2021-12-05 DIAGNOSIS — E1122 Type 2 diabetes mellitus with diabetic chronic kidney disease: Secondary | ICD-10-CM | POA: Diagnosis not present

## 2021-12-05 DIAGNOSIS — N189 Chronic kidney disease, unspecified: Secondary | ICD-10-CM | POA: Diagnosis not present

## 2021-12-05 DIAGNOSIS — I129 Hypertensive chronic kidney disease with stage 1 through stage 4 chronic kidney disease, or unspecified chronic kidney disease: Secondary | ICD-10-CM | POA: Diagnosis not present

## 2021-12-08 DIAGNOSIS — E559 Vitamin D deficiency, unspecified: Secondary | ICD-10-CM | POA: Diagnosis not present

## 2021-12-08 DIAGNOSIS — I10 Essential (primary) hypertension: Secondary | ICD-10-CM | POA: Diagnosis not present

## 2021-12-08 DIAGNOSIS — N183 Chronic kidney disease, stage 3 unspecified: Secondary | ICD-10-CM | POA: Diagnosis not present

## 2021-12-08 DIAGNOSIS — E1165 Type 2 diabetes mellitus with hyperglycemia: Secondary | ICD-10-CM | POA: Diagnosis not present

## 2021-12-14 ENCOUNTER — Ambulatory Visit: Payer: Medicare HMO

## 2021-12-14 ENCOUNTER — Encounter (HOSPITAL_COMMUNITY): Payer: Medicare HMO

## 2021-12-18 ENCOUNTER — Other Ambulatory Visit (HOSPITAL_COMMUNITY): Payer: Self-pay | Admitting: Hematology

## 2021-12-18 ENCOUNTER — Ambulatory Visit (HOSPITAL_COMMUNITY)
Admission: RE | Admit: 2021-12-18 | Discharge: 2021-12-18 | Disposition: A | Discharge status: Home / Self Care | Payer: Medicare HMO | Source: Ambulatory Visit | Attending: Hematology | Admitting: Hematology

## 2021-12-18 DIAGNOSIS — Z170 Estrogen receptor positive status [ER+]: Secondary | ICD-10-CM | POA: Diagnosis not present

## 2021-12-18 DIAGNOSIS — Z1231 Encounter for screening mammogram for malignant neoplasm of breast: Secondary | ICD-10-CM | POA: Diagnosis not present

## 2021-12-18 DIAGNOSIS — R928 Other abnormal and inconclusive findings on diagnostic imaging of breast: Secondary | ICD-10-CM

## 2021-12-18 DIAGNOSIS — C50312 Malignant neoplasm of lower-inner quadrant of left female breast: Secondary | ICD-10-CM | POA: Diagnosis not present

## 2021-12-18 DIAGNOSIS — R921 Mammographic calcification found on diagnostic imaging of breast: Secondary | ICD-10-CM

## 2021-12-20 DIAGNOSIS — I2510 Atherosclerotic heart disease of native coronary artery without angina pectoris: Secondary | ICD-10-CM | POA: Diagnosis not present

## 2021-12-20 DIAGNOSIS — E1122 Type 2 diabetes mellitus with diabetic chronic kidney disease: Secondary | ICD-10-CM | POA: Diagnosis not present

## 2021-12-20 DIAGNOSIS — C50912 Malignant neoplasm of unspecified site of left female breast: Secondary | ICD-10-CM | POA: Diagnosis not present

## 2021-12-20 DIAGNOSIS — Z955 Presence of coronary angioplasty implant and graft: Secondary | ICD-10-CM | POA: Diagnosis not present

## 2021-12-20 DIAGNOSIS — E7849 Other hyperlipidemia: Secondary | ICD-10-CM | POA: Diagnosis not present

## 2021-12-20 DIAGNOSIS — E1151 Type 2 diabetes mellitus with diabetic peripheral angiopathy without gangrene: Secondary | ICD-10-CM | POA: Diagnosis not present

## 2021-12-20 DIAGNOSIS — M654 Radial styloid tenosynovitis [de Quervain]: Secondary | ICD-10-CM | POA: Diagnosis not present

## 2021-12-20 DIAGNOSIS — I10 Essential (primary) hypertension: Secondary | ICD-10-CM | POA: Diagnosis not present

## 2021-12-25 DIAGNOSIS — H401211 Low-tension glaucoma, right eye, mild stage: Secondary | ICD-10-CM | POA: Diagnosis not present

## 2021-12-26 ENCOUNTER — Ambulatory Visit: Payer: Medicare HMO | Admitting: Vascular Surgery

## 2021-12-26 ENCOUNTER — Ambulatory Visit (INDEPENDENT_AMBULATORY_CARE_PROVIDER_SITE_OTHER)
Admission: RE | Admit: 2021-12-26 | Discharge: 2021-12-26 | Disposition: A | Discharge status: Home / Self Care | Payer: Medicare HMO | Source: Ambulatory Visit | Attending: Vascular Surgery | Admitting: Vascular Surgery

## 2021-12-26 ENCOUNTER — Ambulatory Visit (HOSPITAL_COMMUNITY)
Admission: RE | Admit: 2021-12-26 | Discharge: 2021-12-26 | Disposition: A | Discharge status: Home / Self Care | Payer: Medicare HMO | Source: Ambulatory Visit | Attending: Vascular Surgery | Admitting: Vascular Surgery

## 2021-12-26 ENCOUNTER — Encounter: Payer: Self-pay | Admitting: Vascular Surgery

## 2021-12-26 VITALS — BP 150/72 | HR 64 | Temp 98.3°F | Resp 20 | Ht 62.0 in | Wt 215.0 lb

## 2021-12-26 DIAGNOSIS — I6523 Occlusion and stenosis of bilateral carotid arteries: Secondary | ICD-10-CM | POA: Diagnosis not present

## 2021-12-26 DIAGNOSIS — I739 Peripheral vascular disease, unspecified: Secondary | ICD-10-CM

## 2021-12-26 NOTE — Progress Notes (Signed)
[facility] [place] ?PROGRESS NOTE ? ?ASSESSMENT / PLAN: ?[name] is a 76 y.o. 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 Progression of carotid disease noted. No progression of PAD. ? ?I do not think the patient is a candidate for any type of intervention for carotid artery stenosis or claudication at the moment, but needs close monitoring.  ? ?The patient should continue best medical therapy for carotid artery stenosis including: ?Complete cessation from all tobacco products. ?Blood glucose control with goal A1c < 7%. ?Blood pressure control with goal blood pressure < 140/90 mmHg. ?Lipid reduction therapy with goal LDL-C <100 mg/dL (<70 if symptomatic from carotid artery stenosis).  ?Aspirin '81mg'$  PO QD.  ?Atorvastatin 40-'80mg'$  PO QD (or other "high intensity" statin therapy). ? ?Follow up in 12 months.  ? ?SUBJECTIVE: ?No neurologic symptoms. No recent stroke / TIA. Reports stable claudication. Continues to exercise in the pool at the [facility]. No ischemic rest pain. No ulceration. ? ?OBJECTIVE: ?BP (!) 150/72 (BP Location: Right Arm, Patient Position: Sitting, Cuff Size: Normal)   Pulse 64   Temp 98.3 ?F (36.8 ?C)   Resp 20   Ht '5\' 2"'$  (1.575 m)   Wt 215 lb (97.5 kg)   SpO2 97%   BMI 39.32 kg/m?  ? ?NAD ?RRR ?Unlabored ?No palpable pedal pulses. ? ? ?  Latest Ref Rng & Units 08/01/2021  ?  9:35 AM 01/20/2021  ?  9:52 AM 07/18/2020  ? 12:27 PM  ?CBC  ?WBC 4.0 - 10.5 K/uL 6.8   6.4   7.7    ?Hemoglobin 12.0 - 15.0 g/dL 11.3   12.3   12.1    ?Hematocrit 36.0 - 46.0 % 36.0   36.9   37.3    ?Platelets 150 - 400 K/uL 222   260   277    ?  ? ? ?  Latest Ref Rng & Units 08/01/2021  ?  9:35 AM 01/20/2021  ?  9:52 AM 07/18/2020  ? 12:27 PM  ?CMP  ?Glucose 70 - 99 mg/dL 158   154   152    ?BUN 8 - 23 mg/dL '19   21   25    '$ ?Creatinine 0.44 - 1.00 mg/dL 1.17    1.13   1.47    ?Sodium 135 - 145 mmol/L 138   138   140    ?Potassium 3.5 - 5.1 mmol/L 4.3   3.9   4.1    ?Chloride 98 - 111 mmol/L 107   105   105    ?CO2 22 - 32 mmol/L '21   23   23    '$ ?Calcium 8.9 - 10.3 mg/dL 8.9   9.1   9.4    ?Total Protein 6.5 - 8.1 g/dL 6.9   7.0   7.3    ?Total Bilirubin 0.3 - 1.2 mg/dL 1.0   1.1   0.5    ?Alkaline Phos 38 - 126 U/L 48   50   46    ?AST 15 - 41 U/L '21   24   23    '$ ?ALT 0 - 44 U/L '20   27   26    '$ ? ? ?CrCl cannot be calculated (Patient's most recent lab result is older than the maximum 21 days allowed.). ? ?+-------+-----------+-----------+------------+------------+  ?ABI/TBIToday's ABIToday's TBIPrevious ABIPrevious TBI  ?+-------+-----------+-----------+------------+------------+  ?Right  0.95  0.56       0.84        0.56          ?+-------+-----------+-----------+------------+------------+  ?Left   0.98       0.65       0.69        0.42          ?+-------+-----------+-----------+------------+------------+  ? ?Carotid duplex ?Right Carotid: Velocities in the right ICA are consistent with a 60-79%  ?               stenosis.  ? ?Left Carotid: Velocities in the left ICA are consistent with a 1-39%  ?stenosis.  ? ?Vertebrals:  Bilateral vertebral arteries demonstrate antegrade flow.  ?Subclavians: Normal flow hemodynamics were seen in bilateral subclavian  ?             arteries.  ? ?[name]. Stanford Breed, MD ?[facility] [place] ?Office Phone Number: [phone] ?12/26/2021 5:26 PM ? ? ? ?

## 2022-01-02 DIAGNOSIS — N189 Chronic kidney disease, unspecified: Secondary | ICD-10-CM | POA: Diagnosis not present

## 2022-01-02 DIAGNOSIS — Z6839 Body mass index (BMI) 39.0-39.9, adult: Secondary | ICD-10-CM | POA: Diagnosis not present

## 2022-01-02 DIAGNOSIS — I129 Hypertensive chronic kidney disease with stage 1 through stage 4 chronic kidney disease, or unspecified chronic kidney disease: Secondary | ICD-10-CM | POA: Diagnosis not present

## 2022-01-02 DIAGNOSIS — E1122 Type 2 diabetes mellitus with diabetic chronic kidney disease: Secondary | ICD-10-CM | POA: Diagnosis not present

## 2022-01-11 ENCOUNTER — Ambulatory Visit (HOSPITAL_COMMUNITY)
Admission: RE | Admit: 2022-01-11 | Discharge: 2022-01-11 | Disposition: A | Discharge status: Home / Self Care | Payer: Medicare HMO | Source: Ambulatory Visit | Attending: Hematology | Admitting: Hematology

## 2022-01-11 DIAGNOSIS — R928 Other abnormal and inconclusive findings on diagnostic imaging of breast: Secondary | ICD-10-CM | POA: Insufficient documentation

## 2022-01-11 DIAGNOSIS — R921 Mammographic calcification found on diagnostic imaging of breast: Secondary | ICD-10-CM

## 2022-01-12 ENCOUNTER — Other Ambulatory Visit (HOSPITAL_COMMUNITY): Payer: Self-pay | Admitting: Hematology

## 2022-01-12 DIAGNOSIS — R921 Mammographic calcification found on diagnostic imaging of breast: Secondary | ICD-10-CM

## 2022-01-12 DIAGNOSIS — R928 Other abnormal and inconclusive findings on diagnostic imaging of breast: Secondary | ICD-10-CM

## 2022-01-30 DIAGNOSIS — H401211 Low-tension glaucoma, right eye, mild stage: Secondary | ICD-10-CM | POA: Diagnosis not present

## 2022-02-05 ENCOUNTER — Ambulatory Visit
Admission: RE | Admit: 2022-02-05 | Discharge: 2022-02-05 | Disposition: A | Discharge status: Home / Self Care | Payer: Medicare HMO | Source: Ambulatory Visit | Attending: Hematology | Admitting: Hematology

## 2022-02-05 DIAGNOSIS — R921 Mammographic calcification found on diagnostic imaging of breast: Secondary | ICD-10-CM

## 2022-02-05 DIAGNOSIS — R928 Other abnormal and inconclusive findings on diagnostic imaging of breast: Secondary | ICD-10-CM

## 2022-02-05 DIAGNOSIS — D0512 Intraductal carcinoma in situ of left breast: Secondary | ICD-10-CM | POA: Diagnosis not present

## 2022-02-05 DIAGNOSIS — N6022 Fibroadenosis of left breast: Secondary | ICD-10-CM | POA: Diagnosis not present

## 2022-02-05 HISTORY — PX: BREAST BIOPSY: SHX20

## 2022-02-06 ENCOUNTER — Inpatient Hospital Stay (HOSPITAL_COMMUNITY): Payer: Medicare HMO | Attending: Hematology

## 2022-02-06 DIAGNOSIS — Z8639 Personal history of other endocrine, nutritional and metabolic disease: Secondary | ICD-10-CM | POA: Insufficient documentation

## 2022-02-06 DIAGNOSIS — C50312 Malignant neoplasm of lower-inner quadrant of left female breast: Secondary | ICD-10-CM | POA: Insufficient documentation

## 2022-02-06 DIAGNOSIS — Z170 Estrogen receptor positive status [ER+]: Secondary | ICD-10-CM | POA: Diagnosis not present

## 2022-02-06 DIAGNOSIS — E559 Vitamin D deficiency, unspecified: Secondary | ICD-10-CM

## 2022-02-06 DIAGNOSIS — Z79811 Long term (current) use of aromatase inhibitors: Secondary | ICD-10-CM | POA: Diagnosis not present

## 2022-02-06 DIAGNOSIS — N189 Chronic kidney disease, unspecified: Secondary | ICD-10-CM | POA: Insufficient documentation

## 2022-02-06 LAB — COMPREHENSIVE METABOLIC PANEL
ALT: 21 U/L (ref 0–44)
AST: 22 U/L (ref 15–41)
Albumin: 4.1 g/dL (ref 3.5–5.0)
Alkaline Phosphatase: 46 U/L (ref 38–126)
Anion gap: 8 (ref 5–15)
BUN: 24 mg/dL — ABNORMAL HIGH (ref 8–23)
CO2: 23 mmol/L (ref 22–32)
Calcium: 9.1 mg/dL (ref 8.9–10.3)
Chloride: 109 mmol/L (ref 98–111)
Creatinine, Ser: 1.18 mg/dL — ABNORMAL HIGH (ref 0.44–1.00)
GFR, Estimated: 48 mL/min — ABNORMAL LOW (ref >60)
Glucose, Bld: 153 mg/dL — ABNORMAL HIGH (ref 70–99)
Potassium: 3.9 mmol/L (ref 3.5–5.1)
Sodium: 140 mmol/L (ref 135–145)
Total Bilirubin: 0.9 mg/dL (ref 0.3–1.2)
Total Protein: 7.5 g/dL (ref 6.5–8.1)

## 2022-02-06 LAB — CBC WITH DIFFERENTIAL/PLATELET
Abs Immature Granulocytes: 0.03 10*3/uL (ref 0.00–0.07)
Basophils Absolute: 0 10*3/uL (ref 0.0–0.1)
Basophils Relative: 1 %
Eosinophils Absolute: 0.2 10*3/uL (ref 0.0–0.5)
Eosinophils Relative: 2 %
HCT: 36.1 % (ref 36.0–46.0)
Hemoglobin: 11.5 g/dL — ABNORMAL LOW (ref 12.0–15.0)
Immature Granulocytes: 0 %
Lymphocytes Relative: 33 %
Lymphs Abs: 2.6 10*3/uL (ref 0.7–4.0)
MCH: 29.2 pg (ref 26.0–34.0)
MCHC: 31.9 g/dL (ref 30.0–36.0)
MCV: 91.6 fL (ref 80.0–100.0)
Monocytes Absolute: 0.5 10*3/uL (ref 0.1–1.0)
Monocytes Relative: 6 %
Neutro Abs: 4.5 10*3/uL (ref 1.7–7.7)
Neutrophils Relative %: 58 %
Platelets: 254 10*3/uL (ref 150–400)
RBC: 3.94 MIL/uL (ref 3.87–5.11)
RDW: 14.7 % (ref 11.5–15.5)
WBC: 7.8 10*3/uL (ref 4.0–10.5)
nRBC: 0 % (ref 0.0–0.2)

## 2022-02-06 LAB — VITAMIN D 25 HYDROXY (VIT D DEFICIENCY, FRACTURES): Vit D, 25-Hydroxy: 51.02 ng/mL (ref 30–100)

## 2022-02-13 ENCOUNTER — Other Ambulatory Visit (HOSPITAL_COMMUNITY): Payer: Self-pay | Admitting: *Deleted

## 2022-02-13 ENCOUNTER — Inpatient Hospital Stay (HOSPITAL_COMMUNITY): Payer: Medicare HMO | Admitting: Hematology

## 2022-02-13 VITALS — BP 168/67 | HR 58 | Temp 97.2°F | Resp 18 | Ht 62.0 in | Wt 216.2 lb

## 2022-02-13 DIAGNOSIS — N179 Acute kidney failure, unspecified: Secondary | ICD-10-CM | POA: Diagnosis not present

## 2022-02-13 DIAGNOSIS — Z79811 Long term (current) use of aromatase inhibitors: Secondary | ICD-10-CM | POA: Diagnosis not present

## 2022-02-13 DIAGNOSIS — E559 Vitamin D deficiency, unspecified: Secondary | ICD-10-CM | POA: Diagnosis not present

## 2022-02-13 DIAGNOSIS — Z170 Estrogen receptor positive status [ER+]: Secondary | ICD-10-CM

## 2022-02-13 DIAGNOSIS — Z8639 Personal history of other endocrine, nutritional and metabolic disease: Secondary | ICD-10-CM | POA: Diagnosis not present

## 2022-02-13 DIAGNOSIS — C50312 Malignant neoplasm of lower-inner quadrant of left female breast: Secondary | ICD-10-CM | POA: Diagnosis not present

## 2022-02-13 DIAGNOSIS — N189 Chronic kidney disease, unspecified: Secondary | ICD-10-CM | POA: Diagnosis not present

## 2022-02-13 NOTE — Progress Notes (Signed)
[place] [address] [place] [address]   Patient Care Team: [name], MD as PCP - General Branch, Alphonse Guild, MD as PCP - Cardiology (Cardiology) Kary Kos, MD (Neurosurgery) Grace Isaac, MD (Inactive) (Cardiothoracic Surgery)  SUMMARY OF ONCOLOGIC HISTORY: Oncology History  Malignant neoplasm of lower-inner quadrant of left breast in female, estrogen receptor positive ([place])  03/03/2019 Cancer Staging   Staging form: Breast, AJCC 8th Edition - Pathologic stage from 03/03/2019: Stage IA (pT1b, pN0, cM0, G1, ER+, PR-, HER2-) - Signed by [place] Phlegm, NP on 03/11/2019   03/11/2019 Initial Diagnosis   Malignant neoplasm of lower-inner quadrant of left breast in female, estrogen receptor positive ([place])     CHIEF COMPLIANT: Follow-up of left breast cancer   INTERVAL HISTORY: [name] is a 76 y.o. female here today for follow up of her left breast cancer. Her last visit was on 08/08/2021.   Today she reports feeling good. She has an appointment with Dr. Donne Hazel to discuss surgery on 6/29. She expresses a preference for lumpectomy rather than mastectomy at this time. She is taking anastrozole and tolerating it well.   REVIEW OF SYSTEMS:   Review of Systems  Constitutional:  Negative for appetite change and fatigue.  Musculoskeletal:  Positive for arthralgias (legs) and back pain (5/10).  Neurological:  Positive for numbness.  All other systems reviewed and are negative.   I have reviewed the past medical history, past surgical history, social history and family history with the patient and they are unchanged from previous note.   ALLERGIES:   has No Known Allergies.   MEDICATIONS:  Current Outpatient Medications  Medication Sig Dispense Refill   acetaminophen (TYLENOL) 500 MG tablet Take 500 mg by mouth 2 (two) times daily as needed for moderate pain or headache.      allopurinol (ZYLOPRIM) 100 MG tablet Take 100  mg by mouth at bedtime.     amLODipine (NORVASC) 10 MG tablet TAKE 1 TABLET EVERY DAY  *DOSE  INCREASE* 90 tablet 3   anastrozole (ARIMIDEX) 1 MG tablet TAKE 1 TABLET BY MOUTH EVERY DAY 90 tablet 2   Ascorbic Acid (VITAMIN C) 500 MG PACK Take 1 packet by mouth daily.     aspirin 81 MG EC tablet Take by mouth.     atorvastatin (LIPITOR) 40 MG tablet Take 1 tablet by mouth daily.     Cholecalciferol (VITAMIN D) 50 MCG (2000 UT) tablet Take 2,000 Units by mouth daily.     colchicine 0.6 MG tablet Take one tablet PO and may repeat in one hour if no improvement. Then take one tablet every 3 hours until pain is relieved. 30 tablet 0   fluticasone (FLONASE) 50 MCG/ACT nasal spray Place 1 spray into both nostrils as needed for allergies or rhinitis.     FLUZONE HIGH-DOSE QUADRIVALENT 0.7 ML SUSY      hydrALAZINE (APRESOLINE) 25 MG tablet Take 25 mg by mouth 2 (two) times daily.     latanoprost (XALATAN) 0.005 % ophthalmic solution SMARTSIG:In Eye(s)     lisinopril (PRINIVIL,ZESTRIL) 40 MG tablet Take 1 tablet (40 mg total) by mouth daily. 90 tablet 3   LUMIGAN 0.01 % SOLN 1 drop 2 (two) times daily.     metFORMIN (GLUCOPHAGE) 500 MG tablet Take 500 mg by mouth 2 (two) times daily with a meal.      metoprolol tartrate (LOPRESSOR) 50 MG tablet Take 1 tablet (50 mg total)  by mouth 2 (two) times daily. 180 tablet 3   nitroGLYCERIN (NITROSTAT) 0.4 MG SL tablet Place 0.4 mg under the tongue every 5 (five) minutes as needed for chest pain.     tiZANidine (ZANAFLEX) 4 MG tablet Take 4 mg by mouth at bedtime as needed for muscle spasms.  0   vitamin B-12 (CYANOCOBALAMIN) 500 MCG tablet Take 500 mcg by mouth daily.     No current facility-administered medications for this visit.     PHYSICAL EXAMINATION: Performance status (ECOG): 1 - Symptomatic but completely ambulatory  Vitals:   02/13/22 1130  BP: (!) 168/67  Pulse: (!) 58  Resp: 18  Temp: (!) 97.2 F (36.2 C)  SpO2: 100%   Wt Readings from  Last 3 Encounters:  02/13/22 216 lb 3.2 oz (98.1 kg)  12/26/21 215 lb (97.5 kg)  08/08/21 213 lb 3.2 oz (96.7 kg)   Physical Exam Vitals reviewed.  Constitutional:      Appearance: Normal appearance. She is obese.  Cardiovascular:     Rate and Rhythm: Normal rate and regular rhythm.     Pulses: Normal pulses.     Heart sounds: Normal heart sounds.  Pulmonary:     Effort: Pulmonary effort is normal.     Breath sounds: Normal breath sounds.  Chest:  Breasts:    Left: No swelling, bleeding, inverted nipple, mass, nipple discharge, skin change or tenderness.  Lymphadenopathy:     Upper Body:     Left upper body: No supraclavicular or axillary adenopathy.  Neurological:     General: No focal deficit present.     Mental Status: She is alert and oriented to person, place, and time.  Psychiatric:        Mood and Affect: Mood normal.        Behavior: Behavior normal.     Breast Exam Chaperone: Thana Ates     LABORATORY DATA:  I have reviewed the data as listed    Latest Ref Rng & Units 02/06/2022   10:30 AM 08/01/2021    9:35 AM 01/20/2021    9:52 AM  CMP  Glucose 70 - 99 mg/dL 153  158  154   BUN 8 - 23 mg/dL '24  19  21   ' Creatinine 0.44 - 1.00 mg/dL 1.18  1.17  1.13   Sodium 135 - 145 mmol/L 140  138  138   Potassium 3.5 - 5.1 mmol/L 3.9  4.3  3.9   Chloride 98 - 111 mmol/L 109  107  105   CO2 22 - 32 mmol/L '23  21  23   ' Calcium 8.9 - 10.3 mg/dL 9.1  8.9  9.1   Total Protein 6.5 - 8.1 g/dL 7.5  6.9  7.0   Total Bilirubin 0.3 - 1.2 mg/dL 0.9  1.0  1.1   Alkaline Phos 38 - 126 U/L 46  48  50   AST 15 - 41 U/L '22  21  24   ' ALT 0 - 44 U/L '21  20  27    ' No results found for: "UXN235" Lab Results  Component Value Date   WBC 7.8 02/06/2022   HGB 11.5 (L) 02/06/2022   HCT 36.1 02/06/2022   MCV 91.6 02/06/2022   PLT 254 02/06/2022   NEUTROABS 4.5 02/06/2022    ASSESSMENT:  1.  Stage Ia (PT1BPNX) left breast IDC: - Abnormal mammogram followed by left breast biopsy  on 11/04/2018 showed small intraductal papilloma with usual ductal hyperplasia, no evidence  of malignancy. - Left lumpectomy by Dr. Donne Hazel on 03/03/2019, pathology showing 0.8 cm grade 1 invasive ductal carcinoma, arising in a complex sclerosing lesion, free margins.  ER-100%, PR-0%, HER-2 negative.  Ki-67 5%. - She was referred to [facility].  No radiation therapy was recommended. -Anastrozole was started on 04/02/2019.   2.  Bone health: - DEXA scan on 03/17/2019 shows T score of -0.2 which is in the normal range.     PLAN:  1.  Stage Ia left breast IDC: - She is tolerating anastrozole very well. - Physical examination today did not reveal any palpable masses. - She had mammogram on 01/11/2022, BI-RADS Category 3 with a 2.5 cm group of upper outer left breast calcifications. 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  Negative for invasive carcinoma.  [place] present.  ER 40% moderate staining, PR more than 1% weak staining.  Second left breast biopsy was benign breast tissue. - We discussed about the new diagnosis.  She has an appointment to see Dr. Donne Hazel on 02/22/2022 to discuss surgical options. - She is reluctant to consider mastectomy.  She will be referred for radiation therapy for their opinion after surgical resection. - I will see her back in 6 months with follow-up with repeat labs and exam unless some surprising findings on lumpectomy.   2.  Bone health: - Vitamin D level is 51.  Continue calcium and vitamin D supplements. - We will consider repeating DEXA scan at next visit.   3.  CKD: - Mild CKD with creatinine around 1.1 and stable.  Breast Cancer therapy associated bone loss: I have recommended calcium, Vitamin D and weight bearing exercises.  Orders placed this encounter:  No orders of the defined types were placed in this encounter.   The patient has a good understanding of the overall plan. She agrees with it. She will call  with any problems that may develop before the next visit here.  Derek Jack, MD [place] [phone]   I, Thana Ates, am acting as a scribe for Dr. Derek Jack.  I, Derek Jack MD, have reviewed the above documentation for accuracy and completeness, and I agree with the above.

## 2022-02-13 NOTE — Patient Instructions (Addendum)
[place] at [facility] Discharge Instructions   You were seen and examined today by Dr. Delton Coombes.  He discussed the results of your biopsy, which showed ductal carcinoma in situ. Follow up with Dr. Donne Hazel as scheduled for surgical removal of this pre-cancer lump.   Continue anastrozole daily as prescribed.   We will see you back    Thank you for choosing [place] at [facility] to provide your oncology and hematology care.  To afford each patient quality time with our provider, please arrive at least 15 minutes before your scheduled appointment time.   If you have a lab appointment with the [facility] please come in thru the Main Entrance and check in at the main information desk.  You need to re-schedule your appointment should you arrive 10 or more minutes late.  We strive to give you quality time with our providers, and arriving late affects you and other patients whose appointments are after yours.  Also, if you no show three or more times for appointments you may be dismissed from the clinic at the providers discretion.     Again, thank you for choosing [facility].  Our hope is that these requests will decrease the amount of time that you wait before being seen by our physicians.       _____________________________________________________________  Should you have questions after your visit to [facility], please contact our office at [phone] and follow the prompts.  Our office hours are 8:00 a.m. and 4:30 p.m. Monday - Friday.  Please note that voicemails left after 4:00 p.m. may not be returned until the following business day.  We are closed weekends and major holidays.  You do have access to a nurse 24-7, just call the main number to the clinic [phone] and do not press any options, hold on the line and a nurse will answer the phone.    For prescription refill requests, have your pharmacy  contact our office and allow 72 hours.    Due to Covid, you will need to wear a mask upon entering the hospital. If you do not have a mask, a mask will be given to you at the Main Entrance upon arrival. For doctor visits, patients may have 1 support person age 29 or older with them. For treatment visits, patients can not have anyone with them due to social distancing guidelines and our immunocompromised population.

## 2022-02-22 ENCOUNTER — Telehealth: Payer: Self-pay

## 2022-02-22 ENCOUNTER — Other Ambulatory Visit: Payer: Self-pay | Admitting: General Surgery

## 2022-02-22 DIAGNOSIS — D0512 Intraductal carcinoma in situ of left breast: Secondary | ICD-10-CM | POA: Diagnosis not present

## 2022-02-22 NOTE — Telephone Encounter (Signed)
   Name: [name]  DOB: [birth year]  MRN: [id number]  Primary Cardiologist: Carlyle Dolly, MD  Chart reviewed as part of pre-operative protocol coverage. Because of [name]'s past medical history and time since last visit, she will require a follow-up tele visit in order to better assess preoperative cardiovascular risk.  Pre-op covering staff: - Please schedule appointment and call patient to inform them. If patient already had an upcoming appointment within acceptable timeframe, please add "pre-op clearance" to the appointment notes so provider is aware. - Please contact requesting surgeon's office via preferred method (i.e, phone, fax) to inform them of need for appointment prior to surgery.  Pharmacologic clearance was not requested.  Elgie Collard, PA-C  02/22/2022, 5:12 PM

## 2022-02-22 NOTE — Telephone Encounter (Signed)
   Pre-operative Risk Assessment    Patient Name: [name]  DOB: 1946/08/04 MRN: [id number]      Request for Surgical Clearance    Procedure:   URGENT Left Breast Seed Guided Lumpectomy  Date of Surgery:  Clearance TBD                                 Surgeon:  Dr. Rolm Bookbinder Surgeon's Group or Practice Name:  [facility]  Phone number:  [phone] Fax number:  [phone]    Type of Clearance Requested:   - Medical    Type of Anesthesia:  General    Additional requests/questions:   Patient is interested in having this surgery ASAP.  Signed, Sung Amabile   02/22/2022, 4:51 PM

## 2022-02-23 ENCOUNTER — Telehealth: Payer: Self-pay | Admitting: *Deleted

## 2022-02-23 NOTE — Telephone Encounter (Signed)
Pt agreeable to plan of care for  tele pre op appt 02/28/22 @ 4 pm. Med rec and consent are done.

## 2022-02-23 NOTE — Telephone Encounter (Signed)
Pt agreeable to plan of care for  tele pre op appt 02/28/22 @ 4 pm. Med rec and consent are done.     Patient Consent for Virtual Visit        [name] has provided verbal consent on 02/23/2022 for a virtual visit (video or telephone).   CONSENT FOR VIRTUAL VISIT FOR:  [name]  By participating in this virtual visit I agree to the following:  I hereby voluntarily request, consent and authorize [facility] and its employed or contracted physicians, Engineer, materials, nurse practitioners or other licensed health care professionals (the Practitioner), to provide me with telemedicine health care services (the "Services") as deemed necessary by the treating Practitioner. I acknowledge and consent to receive the Services by the Practitioner via telemedicine. I understand that the telemedicine visit will involve communicating with the Practitioner through live audiovisual communication technology and the disclosure of certain medical information by electronic transmission. I acknowledge that I have been given the opportunity to request an in-person assessment or other available alternative prior to the telemedicine visit and am voluntarily participating in the telemedicine visit.  I understand that I have the right to withhold or withdraw my consent to the use of telemedicine in the course of my care at any time, without affecting my right to future care or treatment, and that the Practitioner or I may terminate the telemedicine visit at any time. I understand that I have the right to inspect all information obtained and/or recorded in the course of the telemedicine visit and may receive copies of available information for a reasonable fee.  I understand that some of the potential risks of receiving the Services via telemedicine include:  Delay or interruption in medical evaluation due to technological equipment failure or disruption; Information transmitted may not be sufficient (e.g.  poor resolution of images) to allow for appropriate medical decision making by the Practitioner; and/or  In rare instances, security protocols could fail, causing a breach of personal health information.  Furthermore, I acknowledge that it is my responsibility to provide information about my medical history, conditions and care that is complete and accurate to the best of my ability. I acknowledge that Practitioner's advice, recommendations, and/or decision may be based on factors not within their control, such as incomplete or inaccurate data provided by me or distortions of diagnostic images or specimens that may result from electronic transmissions. I understand that the practice of medicine is not an exact science and that Practitioner makes no warranties or guarantees regarding treatment outcomes. I acknowledge that a copy of this consent can be made available to me via my patient portal ([facility]), or I can request a printed copy by calling the office of [facility].    I understand that my insurance will be billed for this visit.   I have read or had this consent read to me. I understand the contents of this consent, which adequately explains the benefits and risks of the Services being provided via telemedicine.  I have been provided ample opportunity to ask questions regarding this consent and the Services and have had my questions answered to my satisfaction. I give my informed consent for the services to be provided through the use of telemedicine in my medical care

## 2022-02-28 ENCOUNTER — Ambulatory Visit (INDEPENDENT_AMBULATORY_CARE_PROVIDER_SITE_OTHER): Payer: Medicare HMO | Admitting: Nurse Practitioner

## 2022-02-28 DIAGNOSIS — Z01810 Encounter for preprocedural cardiovascular examination: Secondary | ICD-10-CM | POA: Diagnosis not present

## 2022-02-28 NOTE — Progress Notes (Signed)
Virtual Visit via Telephone Note   Because of [name]'s co-morbid illnesses, she is at least at moderate risk for complications without adequate follow up.  This format is felt to be most appropriate for this patient at this time.  The patient did not have access to video technology/had technical difficulties with video requiring transitioning to audio format only (telephone).  All issues noted in this document were discussed and addressed.  No physical exam could be performed with this format.  Please refer to the patient's chart for her consent to telehealth for [name].  Evaluation Performed:  Preoperative cardiovascular risk assessment _____________   Date:  02/28/2022   Patient ID:  [name], [name] 22-Mar-1946, MRN [id number] Patient Location:  Home Provider location:   Office  Primary Care Provider:  [name], MD Primary Cardiologist:  Carlyle Dolly, MD  Chief Complaint / Patient Profile   76 y.o. y/o female with a h/o CAD s/p CABG x3 in 2013, hypertension, hyperlipidemia, PAD, type 2 diabetes, CKD, and breast cancer who is pending left breast seed guided lumpectomy, date TBD, with Dr. Rolm Bookbinder of [facility] surgery and presents today for telephonic preoperative cardiovascular risk assessment.  Past Medical History    Past Medical History:  Diagnosis Date   Aortic stenosis    Arthritis    AV block, 1st degree    Back pain    occasionally with weather changes   CAD (coronary artery disease)    Non-STEMI/Taxus stenting 100% left anterior descending (2), January 2008. Residual 70% circumflex;80% right coronary artery. Mild LVD (EF 45%);improved to 55-60% by 2-D echocardiogram,January 2009.    Carotid artery occlusion    DM (diabetes mellitus) ([place])    takes Metformin '1000mg'$  BID   Dyslipidemia    takes Pravastatin daily   Early cataracts, bilateral    Gout    takes Allopurinol daily   Herniated disc    left   Lumbar herniated  disc    Myocardial infarction [facility]) 2008   pt has 2 stents   Nocturia    Obesity    Unspecified essential hypertension    takes Metoprolol,Lisinopril,and Amlodipine   Urinary frequency    Past Surgical History:  Procedure Laterality Date   ABDOMINAL HYSTERECTOMY     BACK SURGERY  2009/2010/2011   BREAST BIOPSY  unknown   left   BREAST LUMPECTOMY WITH RADIOACTIVE SEED LOCALIZATION Left 03/03/2019   Procedure: LEFT BREAST LUMPECTOMY WITH RADIOACTIVE SEED LOCALIZATION;  Surgeon: Rolm Bookbinder, MD;  Location: [facility];  Service: General;  Laterality: Left;   CARDIAC CATHETERIZATION  2008   NON-STEMI/TAXUS STENTING 100% PROXIMAL LAD (X2) JANUARY 2008   CARDIAC CATHETERIZATION  2012   occluded LAD stents, Om1 : 70%, RCA: 80%, Ef 40-45%   CARPAL TUNNEL RELEASE     bilateral   CATARACT EXTRACTION W/PHACO Right 10/11/2017   Procedure: CATARACT EXTRACTION PHACO AND INTRAOCULAR LENS PLACEMENT RIGHT EYE;  Surgeon: Baruch Goldmann, MD;  Location: [facility];  Service: Ophthalmology;  Laterality: Right;  CDE: 5.17   CATARACT EXTRACTION W/PHACO Left 12/27/2017   Procedure: CATARACT EXTRACTION PHACO AND INTRAOCULAR LENS PLACEMENT (IOC);  Surgeon: Baruch Goldmann, MD;  Location: [facility];  Service: Ophthalmology;  Laterality: Left;  CDE: 2.99   CHOLECYSTECTOMY  not sure   COLONOSCOPY     CORONARY ARTERY BYPASS GRAFT  09/05/2011   Procedure: CORONARY ARTERY BYPASS GRAFTING (CABG);  Surgeon: Grace Isaac, MD;  Location: [facility];  Service:  Open Heart Surgery;  Laterality: N/A;  CABG times three using left internal mammary artery and left leg greater saphenous vein harvested endoscopically   LEG TENDON SURGERY  unknown   left    LUMBAR LAMINECTOMY/DECOMPRESSION MICRODISCECTOMY Left 06/04/2014   Procedure: LUMBAR LAMINECTOMY/DECOMPRESSION MICRODISCECTOMY LUMBAR FOUR-FIVE;  Surgeon: Elaina Hoops, MD;  Location: [facility];  Service: Neurosurgery;  Laterality: Left;   MASS EXCISION Left  08/08/2018   Procedure: EXCISION 5CM LIPOMA ON BACK;  Surgeon: Virl Cagey, MD;  Location: [facility];  Service: General;  Laterality: Left;   TONSILLECTOMY      Allergies  No Known Allergies  History of Present Illness    [name] is a 76 y.o. female who presents via audio/video conferencing for a telehealth visit today.  Pt was last seen in cardiology clinic on 06/09/2021 by Dr. Harl Bowie.  At that time [name] was doing well from a cardiac standpoint. The patient is now pending procedure as outlined above. Since her last visit, she has done well from a cardiac standpoint. She denies chest pain, palpitations, dyspnea, pnd, orthopnea, n, v, dizziness, syncope, edema, weight gain, or early satiety. All other systems reviewed and are otherwise negative except as noted above.   Home Medications    Prior to Admission medications   Medication Sig Start Date End Date Taking? Authorizing Provider  acetaminophen (TYLENOL) 500 MG tablet Take 500 mg by mouth 2 (two) times daily as needed for moderate pain or headache.     [provider]  allopurinol (ZYLOPRIM) 100 MG tablet Take 100 mg by mouth at bedtime.    [provider]  amLODipine (NORVASC) 10 MG tablet TAKE 1 TABLET EVERY DAY  *DOSE  INCREASE* 12/15/15   Arnoldo Lenis, MD  anastrozole (ARIMIDEX) 1 MG tablet TAKE 1 TABLET BY MOUTH EVERY DAY 08/24/21   Derek Jack, MD  Ascorbic Acid (VITAMIN C) 500 MG PACK Take 1 packet by mouth daily.    [provider]  aspirin 81 MG EC tablet Take by mouth.    [provider]  atorvastatin (LIPITOR) 40 MG tablet Take 1 tablet by mouth daily. 11/30/20   [provider]  Cholecalciferol (VITAMIN D) 50 MCG (2000 UT) tablet Take 2,000 Units by mouth daily.    [provider]  colchicine 0.6 MG tablet Take one tablet PO and may repeat in one hour if no improvement. Then take one tablet every 3 hours until pain is relieved. 06/20/14    Ashley Murrain, NP  fluticasone (FLONASE) 50 MCG/ACT nasal spray Place 1 spray into both nostrils as needed for allergies or rhinitis. 05/10/20   [provider]  FLUZONE HIGH-DOSE QUADRIVALENT 0.7 ML SUSY  05/17/21   [provider]  hydrALAZINE (APRESOLINE) 25 MG tablet Take 25 mg by mouth 2 (two) times daily. 02/23/21 02/23/22  [provider]  latanoprost (XALATAN) 0.005 % ophthalmic solution SMARTSIG:In Eye(s) 12/25/21   [provider]  lisinopril (PRINIVIL,ZESTRIL) 40 MG tablet Take 1 tablet (40 mg total) by mouth daily. 10/23/11   Wellington Hampshire, MD  LUMIGAN 0.01 % SOLN 1 drop 2 (two) times daily. 01/30/22   [provider]  metFORMIN (GLUCOPHAGE) 500 MG tablet Take 500 mg by mouth 2 (two) times daily with a meal.     [provider]  metoprolol tartrate (LOPRESSOR) 50 MG tablet Take 1 tablet (50 mg total) by mouth 2 (two) times daily. 12/27/20   Arnoldo Lenis,  MD  nitroGLYCERIN (NITROSTAT) 0.4 MG SL tablet Place 0.4 mg under the tongue every 5 (five) minutes as needed for chest pain.    [provider]  tiZANidine (ZANAFLEX) 4 MG tablet Take 4 mg by mouth at bedtime as needed for muscle spasms. 09/17/17   [provider]  vitamin B-12 (CYANOCOBALAMIN) 500 MCG tablet Take 500 mcg by mouth daily.    [provider]    Physical Exam    Vital Signs:  [name] does not have vital signs available for review today.  Given telephonic nature of communication, physical exam is limited. AAOx3. NAD. Normal affect.  Speech and respirations are unlabored.  Accessory Clinical Findings    None  Assessment & Plan    1.  Preoperative Cardiovascular Risk Assessment:  According to the Revised Cardiac Risk Index (RCRI), her Perioperative Risk of Major Cardiac Event is (%): 0.9. Her Functional Capacity in METs is: 5.81 according to the [facility] Activity Status Index (DASI).Therefore, based on ACC/AHA guidelines, patient  would be at acceptable risk for the planned procedure without further cardiovascular testing.    A copy of this note will be routed to requesting surgeon.  Time:   Today, I have spent 4 minutes with the patient with telehealth technology discussing medical history, symptoms, and management plan.     Lenna Sciara, NP  02/28/2022, 4:10 PM

## 2022-03-02 ENCOUNTER — Other Ambulatory Visit: Payer: Self-pay | Admitting: General Surgery

## 2022-03-02 DIAGNOSIS — D0512 Intraductal carcinoma in situ of left breast: Secondary | ICD-10-CM

## 2022-03-06 NOTE — Pre-Procedure Instructions (Signed)
Surgical Instructions    Your procedure is scheduled on March 14, 2022.  Report to [facility] Main Entrance "A" at 6:30 A.M., then check in with the Admitting office.  Call this number if you have problems the morning of surgery:  [phone]   If you have any questions prior to your surgery date call [phone]: Open Monday-Friday 8am-4pm    Remember:  Do not eat after midnight the night before your surgery  You may drink clear liquids until 5:30 AM the morning of your surgery.   Clear liquids allowed are: Water, Non-Citrus Juices (without pulp), Carbonated Beverages, Clear Tea, Black Coffee Only (NO MILK, CREAM OR POWDERED CREAMER of any kind), and Gatorade.  Patient Instructions  The night before surgery:  No food after midnight. ONLY clear liquids after midnight  The day of surgery (if you have diabetes): Drink ONE (1) 12 oz G2 given to you in your pre admission testing appointment by 5:30 AM the morning of surgery. Drink in one sitting. Do not sip.  This drink was given to you during your hospital  pre-op appointment visit.  Nothing else to drink after completing the  12 oz bottle of G2.         If you have questions, please contact your surgeon's office.     Take these medicines the morning of surgery with A SIP OF WATER:  amLODipine (NORVASC)   atorvastatin (LIPITOR)   hydrALAZINE (APRESOLINE)  metoprolol tartrate (LOPRESSOR)  anastrozole (ARIMIDEX)   Take these medicines the morning of surgery AS NEEDED:  acetaminophen (TYLENOL)  Colchicine  fluticasone (FLONASE)   nitroGLYCERIN (NITROSTAT) - please let hospital staff know If you take this medication the day of surgery.    Follow your surgeon's instructions on when to stop Aspirin.  If no instructions were given by your surgeon then you will need to call the office to get those instructions.     As of today, STOP taking any Aspirin (unless otherwise instructed by your surgeon) Aleve, Naproxen, Ibuprofen,  Motrin, Advil, Goody's, BC's, all herbal medications, fish oil, and all vitamins.  WHAT DO I DO ABOUT MY DIABETES MEDICATION?   Do not take metFORMIN (GLUCOPHAGE) the morning of surgery.   HOW TO MANAGE YOUR DIABETES BEFORE AND AFTER SURGERY  Why is it important to control my blood sugar before and after surgery? Improving blood sugar levels before and after surgery helps healing and can limit problems. A way of improving blood sugar control is eating a healthy diet by:  Eating less sugar and carbohydrates  Increasing activity/exercise  Talking with your doctor about reaching your blood sugar goals High blood sugars (greater than 180 mg/dL) can raise your risk of infections and slow your recovery, so you will need to focus on controlling your diabetes during the weeks before surgery. Make sure that the doctor who takes care of your diabetes knows about your planned surgery including the date and location.  How do I manage my blood sugar before surgery? Check your blood sugar at least 4 times a day, starting 2 days before surgery, to make sure that the level is not too high or low.  Check your blood sugar the morning of your surgery when you wake up and every 2 hours until you get to the Short Stay unit.  If your blood sugar is less than 70 mg/dL, you will need to treat for low blood sugar: Do not take insulin. Treat a low blood sugar (less than 70 mg/dL) with  cup of clear juice (cranberry or apple), 4 glucose tablets, OR glucose gel. Recheck blood sugar in 15 minutes after treatment (to make sure it is greater than 70 mg/dL). If your blood sugar is not greater than 70 mg/dL on recheck, call [phone] for further instructions. Report your blood sugar to the short stay nurse when you get to Short Stay.  If you are admitted to the hospital after surgery: Your blood sugar will be checked by the staff and you will probably be given insulin after surgery (instead of oral diabetes  medicines) to make sure you have good blood sugar levels. The goal for blood sugar control after surgery is 80-180 mg/dL.                      Do NOT Smoke (Tobacco/Vaping) for 24 hours prior to your procedure.  If you use a CPAP at night, you may bring your mask/headgear for your overnight stay.   Contacts, glasses, piercing's, hearing aid's, dentures or partials may not be worn into surgery, please bring cases for these belongings.    For patients admitted to the hospital, discharge time will be determined by your treatment team.   Patients discharged the day of surgery will not be allowed to drive home, and someone needs to stay with them for 24 hours.  SURGICAL WAITING ROOM VISITATION Patients having surgery or a procedure may have two support people in the waiting room. These visitors may be switched out with other visitors if needed. Children under the age of 43 must have an adult accompany them who is not the patient. If the patient needs to stay at the hospital during part of their recovery, the visitor guidelines for inpatient rooms apply.  Please refer to the [facility] website for the visitor guidelines for Inpatients (after your surgery is over and you are in a regular room).    Special instructions:   - Preparing For Surgery  Before surgery, you can play an important role. Because skin is not sterile, your skin needs to be as free of germs as possible. You can reduce the number of germs on your skin by washing with CHG (chlorahexidine gluconate) Soap before surgery.  CHG is an antiseptic cleaner which kills germs and bonds with the skin to continue killing germs even after washing.    Oral Hygiene is also important to reduce your risk of infection.  Remember - BRUSH YOUR TEETH THE MORNING OF SURGERY WITH YOUR REGULAR TOOTHPASTE  Please do not use if you have an allergy to CHG or antibacterial soaps. If your skin becomes reddened/irritated stop using the CHG.  Do  not shave (including legs and underarms) for at least 48 hours prior to first CHG shower. It is OK to shave your face.  Please follow these instructions carefully.   Shower the NIGHT BEFORE SURGERY and the MORNING OF SURGERY  If you chose to wash your hair, wash your hair first as usual with your normal shampoo.  After you shampoo, rinse your hair and body thoroughly to remove the shampoo.  Use CHG Soap as you would any other liquid soap. You can apply CHG directly to the skin and wash gently with a scrungie or a clean washcloth.   Apply the CHG Soap to your body ONLY FROM THE NECK DOWN.  Do not use on open wounds or open sores. Avoid contact with your eyes, ears, mouth and genitals (private parts). Wash Face and genitals (private parts)  with your normal soap.   Wash thoroughly, paying special attention to the area where your surgery will be performed.  Thoroughly rinse your body with warm water from the neck down.  DO NOT shower/wash with your normal soap after using and rinsing off the CHG Soap.  Pat yourself dry with a CLEAN TOWEL.  Wear CLEAN PAJAMAS to bed the night before surgery  Place CLEAN SHEETS on your bed the night before your surgery  DO NOT SLEEP WITH PETS.   Day of Surgery: Take a shower with CHG soap. Do not wear jewelry or makeup Do not wear lotions, powders, perfumes/colognes, or deodorant. Do not shave 48 hours prior to surgery.  Men may shave face and neck. Do not bring valuables to the hospital.  [facility] is not responsible for any belongings or valuables. Do not wear nail polish, gel polish, artificial nails, or any other type of covering on natural nails (fingers and toes) If you have artificial nails or gel coating that need to be removed by a nail salon, please have this removed prior to surgery. Artificial nails or gel coating may interfere with anesthesia's ability to adequately monitor your vital signs. Wear Clean/Comfortable clothing the morning  of surgery Remember to brush your teeth WITH YOUR REGULAR TOOTHPASTE.   Please read over the following fact sheets that you were given.    If you received a COVID test during your pre-op visit  it is requested that you wear a mask when out in public, stay away from anyone that may not be feeling well and notify your surgeon if you develop symptoms. If you have been in contact with anyone that has tested positive in the last 10 days please notify you surgeon.

## 2022-03-07 ENCOUNTER — Encounter (HOSPITAL_COMMUNITY): Payer: Self-pay

## 2022-03-07 ENCOUNTER — Encounter (HOSPITAL_COMMUNITY)
Admission: RE | Admit: 2022-03-07 | Discharge: 2022-03-07 | Disposition: A | Discharge status: Home / Self Care | Payer: Medicare HMO | Source: Ambulatory Visit | Attending: General Surgery | Admitting: General Surgery

## 2022-03-07 ENCOUNTER — Other Ambulatory Visit: Payer: Self-pay

## 2022-03-07 VITALS — BP 146/53 | HR 68 | Temp 98.2°F | Resp 18 | Ht 60.0 in | Wt 215.0 lb

## 2022-03-07 DIAGNOSIS — I10 Essential (primary) hypertension: Secondary | ICD-10-CM | POA: Insufficient documentation

## 2022-03-07 DIAGNOSIS — E119 Type 2 diabetes mellitus without complications: Secondary | ICD-10-CM | POA: Insufficient documentation

## 2022-03-07 DIAGNOSIS — Z01818 Encounter for other preprocedural examination: Secondary | ICD-10-CM | POA: Diagnosis not present

## 2022-03-07 HISTORY — DX: Chronic kidney disease, unspecified: N18.9

## 2022-03-07 LAB — BASIC METABOLIC PANEL
Anion gap: 10 (ref 5–15)
BUN: 18 mg/dL (ref 8–23)
CO2: 21 mmol/L — ABNORMAL LOW (ref 22–32)
Calcium: 9.3 mg/dL (ref 8.9–10.3)
Chloride: 108 mmol/L (ref 98–111)
Creatinine, Ser: 1.29 mg/dL — ABNORMAL HIGH (ref 0.44–1.00)
GFR, Estimated: 43 mL/min — ABNORMAL LOW (ref >60)
Glucose, Bld: 99 mg/dL (ref 70–99)
Potassium: 3.5 mmol/L (ref 3.5–5.1)
Sodium: 139 mmol/L (ref 135–145)

## 2022-03-07 LAB — CBC
HCT: 38.1 % (ref 36.0–46.0)
Hemoglobin: 12.3 g/dL (ref 12.0–15.0)
MCH: 29.3 pg (ref 26.0–34.0)
MCHC: 32.3 g/dL (ref 30.0–36.0)
MCV: 90.7 fL (ref 80.0–100.0)
Platelets: 259 10*3/uL (ref 150–400)
RBC: 4.2 MIL/uL (ref 3.87–5.11)
RDW: 14.5 % (ref 11.5–15.5)
WBC: 9.4 10*3/uL (ref 4.0–10.5)
nRBC: 0 % (ref 0.0–0.2)

## 2022-03-07 LAB — HEMOGLOBIN A1C
Hgb A1c MFr Bld: 6.8 % — ABNORMAL HIGH (ref 4.8–5.6)
Mean Plasma Glucose: 148.46 mg/dL

## 2022-03-07 LAB — GLUCOSE, CAPILLARY: Glucose-Capillary: 102 mg/dL — ABNORMAL HIGH (ref 70–99)

## 2022-03-07 NOTE — Progress Notes (Signed)
PCP - Dr. Judd Lien Cardiologist - Dr. Carlyle Dolly  PPM/ICD - denies   Chest x-ray - 05/27/14 EKG - 03/07/22 Stress Test - denies ECHO - 09/03/11 Cardiac Cath - denies  Sleep Study - 5-10 years ago per pt, OSA- CPAP - n/a  DM- Type 2 Fasting Blood Sugar - pt unsure Checks Blood Sugar about once a week  Blood Thinner Instructions:n/a Aspirin Instructions: pt instructed to f/u with surgeon for instructions  ERAS Protcol - yes PRE-SURGERY G2- given at PAT  COVID TEST- n/a   Anesthesia review: yes, cardiac hx  Patient denies shortness of breath, fever, cough and chest pain at PAT appointment   All instructions explained to the patient, with a verbal understanding of the material. Patient agrees to go over the instructions while at home for a better understanding.  The opportunity to ask questions was provided.

## 2022-03-08 NOTE — Progress Notes (Signed)
Anesthesia Chart Review:   Case: [id number] Date/Time: 03/14/22 0815   Procedure: LEFT BREAST SEED GUIDED LUMPECTOMY (Left) - LMA   Anesthesia type: General   Pre-op diagnosis: LEFT BREAST DUCTAL CARCINOMA IN SITU   Location: [place] [facility] OR ROOM 02 / [facility]   Surgeons: Rolm Bookbinder, MD       DISCUSSION: Patient is a 76 year old female scheduled for the above procedure.  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 BMI is consistent with morbid obesity.   Preoperative cardiovascular risk assessment done on 02/28/2022 by Diona Browner, NP: "According to the Revised Cardiac Risk Index (RCRI), her Perioperative Risk of Major Cardiac Event is (%): 0.9. Her Functional Capacity in METs is: 5.81 according to the Duke Activity Status Index (DASI).Therefore, based on ACC/AHA guidelines, patient would be at acceptable risk for the planned procedure without further cardiovascular testing."  Anesthesia team to evaluate on the day of surgery.   VS: BP (!) 146/53   Pulse 68   Temp 36.8 C   Resp 18   Ht 5' (1.524 m)   Wt 97.5 kg   SpO2 100%   BMI 41.99 kg/m    PROVIDERS: Burdine, Virgina Evener, MD is PCP Carlyle Dolly, MD is cardiologist Ulice Bold, MD is nephrologist Derek Jack, MD is HEM-ONC Jamelle Haring, MD is vascular surgeon. Last visit 12/26/21. She had progression of carotid disease (60-79% RICA) and no progression of PAD. Continued monitoring  recommended.    LABS: Labs reviewed: Acceptable for surgery. (all labs ordered are listed, but only abnormal results are displayed)  Labs Reviewed  GLUCOSE, CAPILLARY - Abnormal; Notable for the following components:      Result Value   Glucose-Capillary 102 (*)    All other components within normal limits  HEMOGLOBIN A1C - Abnormal; Notable for the following components:   Hgb A1c MFr Bld 6.8 (*)    All other components within normal limits  BASIC METABOLIC PANEL - Abnormal; Notable for the following components:   CO2 21 (*)    Creatinine, Ser 1.29 (*)    GFR, Estimated 43 (*)    All other components within normal limits  CBC     EKG: 03/07/22: Sinus rhythm with Premature atrial complexes Septal infarct , age undetermined Abnormal ECG When compared with ECG of 27-May-2014 15:02, PREVIOUS ECG IS PRESENT No significant change since last tracing Confirmed by Quay Burow [phone]) on 03/07/2022 6:26:59 PM - PR interval is borderline prolonged at 204 ms   CV: US Carotid 12/26/21: Summary:  - Right Carotid: Velocities in the right ICA are consistent with a 60-79% stenosis.  - Left Carotid: Velocities in the left ICA are consistent with a 1-39% stenosis.  - Vertebrals:  Bilateral vertebral arteries demonstrate antegrade flow.  - Subclavians: Normal flow hemodynamics were seen in bilateral subclavian arteries.    Echo 09/03/11: Study Conclusions  Left ventricle: The cavity size was normal. Systolic  function was normal. The estimated ejection fraction was in  the range of 55% to 60%. There was an increased relative  contribution  of atrial contraction to ventricular filling.    Cardiac cath 08/22/11: Hemodynamics: AO:  126/66   mmHg LV:  134/18    mmHg LVEDP: 23  MmHg   Mild gradient across the aortic valve.   Final Conclusions:   1. Significant three-vessel coronary artery disease with an occluded left anterior descending artery where 2 previous drug-eluting stents  were placed.  2. Mildly reduced LV systolic function with an estimated ejection fraction of 40-45%. There is moderate anterior wall hypokinesis as well as dyskinesias of the apex. 3. Mild to moderate elevation in left ventricular end-diastolic pressure. 4. mild gradient across the aortic valve likely due to mild degree of stenosis.   Recommendations: I recommend CABG. Given her three-vessel coronary artery disease including an occluded left anterior descending artery with 2 previous drug-eluting stents were placed, mildly reduced LV systolic function and known history of diabetes, the patient will get the best long-term outcome from surgical revascularization versus PCI. An echocardiogram will be requested before the surgery.    Past Medical History:  Diagnosis Date   Aortic stenosis    mild gradient across AV 07/2611 cath; no AS on 09/03/11 echo   Arthritis    AV block, 1st degree    Back pain    occasionally with weather changes   CAD (coronary artery disease)    Non-STEMI/Taxus DES x2 for 100% LAD; s/p CABG 09/05/11 LIMA-LAD, SVG-OM, SVG-acute marginal   Cancer ([place])    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; left breast DCIS 02/05/22   Carotid artery occlusion    Chronic kidney disease    mild, stage 1   DM (diabetes mellitus) ([place])    takes Metformin '1000mg'$  BID   Dyslipidemia    takes Pravastatin daily   Early cataracts, bilateral    Gout    takes Allopurinol daily   Herniated disc    left   Lumbar herniated disc    Myocardial infarction ([place]) 2008   pt has 2 stents   Nocturia    Obesity    Unspecified essential hypertension    takes Metoprolol,Lisinopril,and Amlodipine   Urinary frequency     Past Surgical History:  Procedure Laterality Date   ABDOMINAL HYSTERECTOMY     BACK SURGERY  2009/2010/2011   BREAST BIOPSY  unknown   left   BREAST LUMPECTOMY WITH RADIOACTIVE SEED LOCALIZATION Left 03/03/2019   Procedure: LEFT BREAST LUMPECTOMY  WITH RADIOACTIVE SEED LOCALIZATION;  Surgeon: Rolm Bookbinder, MD;  Location: [facility];  Service: General;  Laterality: Left;   CARDIAC CATHETERIZATION  2008   NON-STEMI/TAXUS STENTING 100% PROXIMAL LAD (X2) JANUARY 2008   CARDIAC CATHETERIZATION  2012   occluded LAD stents, Om1 : 70%, RCA: 80%, Ef 40-45%   CARPAL TUNNEL RELEASE     bilateral   CATARACT EXTRACTION W/PHACO Right 10/11/2017   Procedure: CATARACT EXTRACTION PHACO AND INTRAOCULAR LENS PLACEMENT RIGHT EYE;  Surgeon: Baruch Goldmann, MD;  Location: [facility];  Service: Ophthalmology;  Laterality: Right;  CDE: 5.17   CATARACT EXTRACTION W/PHACO Left 12/27/2017   Procedure: CATARACT EXTRACTION PHACO AND INTRAOCULAR LENS PLACEMENT (IOC);  Surgeon: Baruch Goldmann, MD;  Location: [facility];  Service: Ophthalmology;  Laterality: Left;  CDE: 2.99   CHOLECYSTECTOMY  not sure   COLONOSCOPY     CORONARY ARTERY BYPASS GRAFT  09/05/2011   Procedure: CORONARY ARTERY BYPASS GRAFTING (CABG);  Surgeon: Grace Isaac, MD;  Location: [facility];  Service: Open Heart Surgery;  Laterality: N/A;  CABG times three using left internal mammary artery and left leg greater saphenous vein harvested endoscopically   LEG TENDON SURGERY  unknown   left    LUMBAR LAMINECTOMY/DECOMPRESSION MICRODISCECTOMY Left 06/04/2014   Procedure: LUMBAR LAMINECTOMY/DECOMPRESSION MICRODISCECTOMY LUMBAR FOUR-FIVE;  Surgeon: Elaina Hoops, MD;  Location: [facility];  Service: Neurosurgery;  Laterality: Left;   MASS EXCISION Left 08/08/2018   Procedure: EXCISION 5CM LIPOMA ON BACK;  Surgeon: Virl Cagey, MD;  Location: [facility];  Service: General;  Laterality: Left;   TONSILLECTOMY      MEDICATIONS:  acetaminophen (TYLENOL) 500 MG tablet   allopurinol (ZYLOPRIM) 100 MG tablet   amLODipine (NORVASC) 10 MG tablet   anastrozole (ARIMIDEX) 1 MG tablet   Ascorbic Acid (VITAMIN C) 1000 MG tablet   aspirin 81 MG EC tablet   atorvastatin (LIPITOR) 40 MG tablet    Cholecalciferol (VITAMIN D) 50 MCG (2000 UT) tablet   colchicine 0.6 MG tablet   fluticasone (FLONASE) 50 MCG/ACT nasal spray   hydrALAZINE (APRESOLINE) 25 MG tablet   lisinopril (PRINIVIL,ZESTRIL) 40 MG tablet   LUMIGAN 0.01 % SOLN   Menthol, Topical Analgesic, (ICY HOT EX)   metFORMIN (GLUCOPHAGE) 500 MG tablet   metoprolol tartrate (LOPRESSOR) 50 MG tablet   nitroGLYCERIN (NITROSTAT) 0.4 MG SL tablet   vitamin B-12 (CYANOCOBALAMIN) 500 MCG tablet   No current facility-administered medications for this encounter.    Myra Gianotti, PA-C Surgical Short Stay/Anesthesiology [facility] Phone [phone] [facility] Phone [phone] 03/09/2022 10:53 AM

## 2022-03-09 ENCOUNTER — Encounter (HOSPITAL_COMMUNITY): Payer: Self-pay

## 2022-03-09 NOTE — Anesthesia Preprocedure Evaluation (Signed)
Anesthesia Evaluation  Patient identified by MRN, date of birth, ID band Patient awake    Reviewed: Allergy & Precautions, NPO status , Patient's Chart, lab work & pertinent test results  Airway Mallampati: II  TM Distance: >3 FB Neck ROM: Full    Dental no notable dental hx.    Pulmonary neg pulmonary ROS,    Pulmonary exam normal breath sounds clear to auscultation       Cardiovascular hypertension, + CAD, + Past MI, + Cardiac Stents, + CABG and + Peripheral Vascular Disease  Normal cardiovascular exam Rhythm:Regular Rate:Normal     Neuro/Psych negative neurological ROS  negative psych ROS   GI/Hepatic negative GI ROS, Neg liver ROS,   Endo/Other  diabetes  Renal/GU Renal InsufficiencyRenal disease  negative genitourinary   Musculoskeletal negative musculoskeletal ROS (+)   Abdominal   Peds negative pediatric ROS (+)  Hematology negative hematology ROS (+)   Anesthesia Other Findings   Reproductive/Obstetrics negative OB ROS                            Anesthesia Physical Anesthesia Plan  ASA: 3  Anesthesia Plan: General   Post-op Pain Management: Minimal or no pain anticipated   Induction: Intravenous  PONV Risk Score and Plan: 3 and Ondansetron, Dexamethasone and Treatment may vary due to age or medical condition  Airway Management Planned: LMA  Additional Equipment:   Intra-op Plan:   Post-operative Plan: Extubation in OR  Informed Consent: I have reviewed the patients History and Physical, chart, labs and discussed the procedure including the risks, benefits and alternatives for the proposed anesthesia with the patient or authorized representative who has indicated his/her understanding and acceptance.     Dental advisory given  Plan Discussed with: CRNA and Surgeon  Anesthesia Plan Comments: (PAT note written 03/09/2022 by Myra Gianotti, PA-C. )        Anesthesia Quick Evaluation

## 2022-03-13 ENCOUNTER — Ambulatory Visit
Admission: RE | Admit: 2022-03-13 | Discharge: 2022-03-13 | Disposition: A | Discharge status: Home / Self Care | Payer: Medicare HMO | Source: Ambulatory Visit | Attending: General Surgery | Admitting: General Surgery

## 2022-03-13 DIAGNOSIS — D0511 Intraductal carcinoma in situ of right breast: Secondary | ICD-10-CM | POA: Diagnosis not present

## 2022-03-13 DIAGNOSIS — D0512 Intraductal carcinoma in situ of left breast: Secondary | ICD-10-CM

## 2022-03-13 MED ORDER — CHLORHEXIDINE GLUCONATE 0.12 % MT SOLN
OROMUCOSAL | Status: AC
  Administered 2022-03-14: 15 mL via OROMUCOSAL
  Filled 2022-03-13: qty 15

## 2022-03-14 ENCOUNTER — Ambulatory Visit (HOSPITAL_COMMUNITY): Payer: Medicare HMO | Admitting: Vascular Surgery

## 2022-03-14 ENCOUNTER — Ambulatory Visit (HOSPITAL_COMMUNITY)
Admission: RE | Admit: 2022-03-14 | Discharge: 2022-03-14 | Disposition: A | Discharge status: Home / Self Care | Payer: Medicare HMO | Attending: General Surgery | Admitting: General Surgery

## 2022-03-14 ENCOUNTER — Encounter (HOSPITAL_COMMUNITY)
Admission: RE | Disposition: A | Discharge status: Home / Self Care | Payer: Self-pay | Source: Home / Self Care | Attending: General Surgery

## 2022-03-14 ENCOUNTER — Ambulatory Visit
Admission: RE | Admit: 2022-03-14 | Discharge: 2022-03-14 | Disposition: A | Discharge status: Home / Self Care | Payer: Medicare HMO | Source: Ambulatory Visit | Attending: General Surgery | Admitting: General Surgery

## 2022-03-14 ENCOUNTER — Ambulatory Visit (HOSPITAL_BASED_OUTPATIENT_CLINIC_OR_DEPARTMENT_OTHER): Payer: Medicare HMO | Admitting: Certified Registered Nurse Anesthetist

## 2022-03-14 ENCOUNTER — Other Ambulatory Visit: Payer: Self-pay

## 2022-03-14 DIAGNOSIS — I10 Essential (primary) hypertension: Secondary | ICD-10-CM | POA: Diagnosis not present

## 2022-03-14 DIAGNOSIS — E119 Type 2 diabetes mellitus without complications: Secondary | ICD-10-CM

## 2022-03-14 DIAGNOSIS — E1122 Type 2 diabetes mellitus with diabetic chronic kidney disease: Secondary | ICD-10-CM | POA: Insufficient documentation

## 2022-03-14 DIAGNOSIS — Z951 Presence of aortocoronary bypass graft: Secondary | ICD-10-CM | POA: Diagnosis not present

## 2022-03-14 DIAGNOSIS — I129 Hypertensive chronic kidney disease with stage 1 through stage 4 chronic kidney disease, or unspecified chronic kidney disease: Secondary | ICD-10-CM | POA: Insufficient documentation

## 2022-03-14 DIAGNOSIS — R920 Mammographic microcalcification found on diagnostic imaging of breast: Secondary | ICD-10-CM | POA: Diagnosis not present

## 2022-03-14 DIAGNOSIS — R928 Other abnormal and inconclusive findings on diagnostic imaging of breast: Secondary | ICD-10-CM | POA: Diagnosis not present

## 2022-03-14 DIAGNOSIS — D0512 Intraductal carcinoma in situ of left breast: Secondary | ICD-10-CM | POA: Diagnosis not present

## 2022-03-14 DIAGNOSIS — I252 Old myocardial infarction: Secondary | ICD-10-CM | POA: Insufficient documentation

## 2022-03-14 DIAGNOSIS — Z170 Estrogen receptor positive status [ER+]: Secondary | ICD-10-CM | POA: Diagnosis not present

## 2022-03-14 DIAGNOSIS — N6022 Fibroadenosis of left breast: Secondary | ICD-10-CM | POA: Diagnosis not present

## 2022-03-14 DIAGNOSIS — I2510 Atherosclerotic heart disease of native coronary artery without angina pectoris: Secondary | ICD-10-CM

## 2022-03-14 DIAGNOSIS — E1151 Type 2 diabetes mellitus with diabetic peripheral angiopathy without gangrene: Secondary | ICD-10-CM | POA: Diagnosis not present

## 2022-03-14 DIAGNOSIS — N6032 Fibrosclerosis of left breast: Secondary | ICD-10-CM | POA: Diagnosis not present

## 2022-03-14 DIAGNOSIS — N6092 Unspecified benign mammary dysplasia of left breast: Secondary | ICD-10-CM | POA: Diagnosis not present

## 2022-03-14 DIAGNOSIS — C50912 Malignant neoplasm of unspecified site of left female breast: Secondary | ICD-10-CM | POA: Diagnosis not present

## 2022-03-14 DIAGNOSIS — Z79811 Long term (current) use of aromatase inhibitors: Secondary | ICD-10-CM | POA: Insufficient documentation

## 2022-03-14 DIAGNOSIS — N62 Hypertrophy of breast: Secondary | ICD-10-CM | POA: Diagnosis not present

## 2022-03-14 HISTORY — PX: BREAST LUMPECTOMY WITH RADIOACTIVE SEED LOCALIZATION: SHX6424

## 2022-03-14 LAB — GLUCOSE, CAPILLARY
Glucose-Capillary: 183 mg/dL — ABNORMAL HIGH (ref 70–99)
Glucose-Capillary: 155 mg/dL — ABNORMAL HIGH (ref 70–99)

## 2022-03-14 SURGERY — BREAST LUMPECTOMY WITH RADIOACTIVE SEED LOCALIZATION
Anesthesia: General | Site: Breast | Laterality: Left

## 2022-03-14 MED ORDER — BUPIVACAINE-EPINEPHRINE (PF) 0.25% -1:200000 IJ SOLN
INTRAMUSCULAR | Status: AC
  Filled 2022-03-14: qty 30

## 2022-03-14 MED ORDER — MIDAZOLAM HCL 2 MG/2ML IJ SOLN
INTRAMUSCULAR | Status: AC
  Filled 2022-03-14: qty 2

## 2022-03-14 MED ORDER — FENTANYL CITRATE (PF) 100 MCG/2ML IJ SOLN
INTRAMUSCULAR | Status: DC | PRN
  Administered 2022-03-14: 50 ug via INTRAVENOUS
  Administered 2022-03-14: 25 ug via INTRAVENOUS

## 2022-03-14 MED ORDER — EPHEDRINE SULFATE-NACL 50-0.9 MG/10ML-% IV SOSY
PREFILLED_SYRINGE | INTRAVENOUS | Status: DC | PRN
  Administered 2022-03-14 (×3): 5 mg via INTRAVENOUS

## 2022-03-14 MED ORDER — CHLORHEXIDINE GLUCONATE 0.12 % MT SOLN
15.0000 mL | Freq: Once | OROMUCOSAL | Status: AC
  Filled 2022-03-14: qty 15

## 2022-03-14 MED ORDER — PROPOFOL 10 MG/ML IV BOLUS
INTRAVENOUS | Status: AC
Start: 2022-03-14 — End: ?
  Filled 2022-03-14: qty 20

## 2022-03-14 MED ORDER — PHENYLEPHRINE 80 MCG/ML (10ML) SYRINGE FOR IV PUSH (FOR BLOOD PRESSURE SUPPORT)
PREFILLED_SYRINGE | INTRAVENOUS | Status: AC
  Filled 2022-03-14: qty 10

## 2022-03-14 MED ORDER — LIDOCAINE 2% (20 MG/ML) 5 ML SYRINGE
INTRAMUSCULAR | Status: DC | PRN
  Administered 2022-03-14: 100 mg via INTRAVENOUS

## 2022-03-14 MED ORDER — ORAL CARE MOUTH RINSE: 15.0000 mL | Freq: Once | OROMUCOSAL | Status: AC

## 2022-03-14 MED ORDER — PHENYLEPHRINE 80 MCG/ML (10ML) SYRINGE FOR IV PUSH (FOR BLOOD PRESSURE SUPPORT)
PREFILLED_SYRINGE | INTRAVENOUS | Status: DC | PRN
  Administered 2022-03-14: 40 ug via INTRAVENOUS
  Administered 2022-03-14: 80 ug via INTRAVENOUS

## 2022-03-14 MED ORDER — DEXAMETHASONE SODIUM PHOSPHATE 10 MG/ML IJ SOLN
INTRAMUSCULAR | Status: DC | PRN
  Administered 2022-03-14: 4 mg via INTRAVENOUS

## 2022-03-14 MED ORDER — ACETAMINOPHEN 500 MG PO TABS
1000.0000 mg | ORAL_TABLET | ORAL | Status: AC
  Administered 2022-03-14: 1000 mg via ORAL
  Filled 2022-03-14: qty 2

## 2022-03-14 MED ORDER — FENTANYL CITRATE (PF) 250 MCG/5ML IJ SOLN
INTRAMUSCULAR | Status: AC
  Filled 2022-03-14: qty 5

## 2022-03-14 MED ORDER — INSULIN ASPART 100 UNIT/ML IJ SOLN
0.0000 [IU] | INTRAMUSCULAR | Status: DC | PRN
  Administered 2022-03-14: 2 [IU] via SUBCUTANEOUS
  Filled 2022-03-14: qty 1

## 2022-03-14 MED ORDER — CHLORHEXIDINE GLUCONATE CLOTH 2 % EX PADS: 6.0000 | MEDICATED_PAD | Freq: Once | CUTANEOUS | Status: DC

## 2022-03-14 MED ORDER — BUPIVACAINE-EPINEPHRINE 0.25% -1:200000 IJ SOLN
INTRAMUSCULAR | Status: DC | PRN
  Administered 2022-03-14: 10 mL

## 2022-03-14 MED ORDER — PROPOFOL 10 MG/ML IV BOLUS
INTRAVENOUS | Status: AC
  Filled 2022-03-14: qty 20

## 2022-03-14 MED ORDER — ENSURE PRE-SURGERY PO LIQD: 296.0000 mL | Freq: Once | ORAL | Status: DC

## 2022-03-14 MED ORDER — LACTATED RINGERS IV SOLN: INTRAVENOUS | Status: DC

## 2022-03-14 MED ORDER — PROPOFOL 10 MG/ML IV BOLUS
INTRAVENOUS | Status: DC | PRN
  Administered 2022-03-14: 150 mg via INTRAVENOUS

## 2022-03-14 MED ORDER — 0.9 % SODIUM CHLORIDE (POUR BTL) OPTIME
TOPICAL | Status: DC | PRN
  Administered 2022-03-14: 1000 mL

## 2022-03-14 MED ORDER — CEFAZOLIN SODIUM-DEXTROSE 2-4 GM/100ML-% IV SOLN
2.0000 g | INTRAVENOUS | Status: AC
  Administered 2022-03-14: 2 g via INTRAVENOUS
  Filled 2022-03-14: qty 100

## 2022-03-14 MED ORDER — ONDANSETRON HCL 4 MG/2ML IJ SOLN
INTRAMUSCULAR | Status: DC | PRN
  Administered 2022-03-14: 4 mg via INTRAVENOUS

## 2022-03-14 SURGICAL SUPPLY — 44 items
ADH SKN CLS APL DERMABOND .7 (GAUZE/BANDAGES/DRESSINGS) ×1
APL PRP STRL LF DISP 70% ISPRP (MISCELLANEOUS)
APPLIER CLIP 9.375 MED OPEN (MISCELLANEOUS)
APR CLP MED 9.3 20 MLT OPN (MISCELLANEOUS)
BAG COUNTER SPONGE SURGICOUNT (BAG) ×3 IMPLANT
BAG SPNG CNTER NS LX DISP (BAG) ×1
BINDER BREAST LRG (GAUZE/BANDAGES/DRESSINGS) IMPLANT
BINDER BREAST XLRG (GAUZE/BANDAGES/DRESSINGS) ×1 IMPLANT
CANISTER SUCT 3000ML PPV (MISCELLANEOUS) ×3 IMPLANT
CHLORAPREP W/TINT 26 (MISCELLANEOUS) ×2 IMPLANT
CLIP APPLIE 9.375 MED OPEN (MISCELLANEOUS) IMPLANT
CLIP VESOCCLUDE MED 6/CT (CLIP) ×3 IMPLANT
COVER PROBE W GEL 5X96 (DRAPES) ×3 IMPLANT
COVER SURGICAL LIGHT HANDLE (MISCELLANEOUS) ×3 IMPLANT
DERMABOND ADVANCED (GAUZE/BANDAGES/DRESSINGS) ×1
DERMABOND ADVANCED .7 DNX12 (GAUZE/BANDAGES/DRESSINGS) ×2 IMPLANT
DEVICE DUBIN SPECIMEN MAMMOGRA (MISCELLANEOUS) ×3 IMPLANT
DRAPE CHEST BREAST 15X10 FENES (DRAPES) ×3 IMPLANT
ELECT COATED BLADE 2.86 ST (ELECTRODE) ×3 IMPLANT
ELECT REM PT RETURN 9FT ADLT (ELECTROSURGICAL) ×2
ELECTRODE REM PT RTRN 9FT ADLT (ELECTROSURGICAL) ×2 IMPLANT
GLOVE BIO SURGEON STRL SZ7 (GLOVE) ×6 IMPLANT
GLOVE BIOGEL PI IND STRL 7.5 (GLOVE) ×2 IMPLANT
GLOVE BIOGEL PI INDICATOR 7.5 (GLOVE) ×1
GOWN STRL REUS W/ TWL LRG LVL3 (GOWN DISPOSABLE) ×4 IMPLANT
GOWN STRL REUS W/TWL LRG LVL3 (GOWN DISPOSABLE) ×4
KIT BASIN OR (CUSTOM PROCEDURE TRAY) ×3 IMPLANT
KIT MARKER MARGIN INK (KITS) ×3 IMPLANT
LIGHT WAVEGUIDE WIDE FLAT (MISCELLANEOUS) IMPLANT
NDL HYPO 25GX1X1/2 BEV (NEEDLE) ×2 IMPLANT
NEEDLE HYPO 25GX1X1/2 BEV (NEEDLE) ×2 IMPLANT
NS IRRIG 1000ML POUR BTL (IV SOLUTION) ×3 IMPLANT
PACK GENERAL/GYN (CUSTOM PROCEDURE TRAY) ×3 IMPLANT
STRIP CLOSURE SKIN 1/2X4 (GAUZE/BANDAGES/DRESSINGS) ×3 IMPLANT
SUT MNCRL AB 4-0 PS2 18 (SUTURE) ×3 IMPLANT
SUT MON AB 5-0 PS2 18 (SUTURE) IMPLANT
SUT SILK 2 0 SH (SUTURE) ×1 IMPLANT
SUT VIC AB 2-0 SH 27 (SUTURE) ×2
SUT VIC AB 2-0 SH 27XBRD (SUTURE) ×2 IMPLANT
SUT VIC AB 3-0 SH 27 (SUTURE) ×2
SUT VIC AB 3-0 SH 27X BRD (SUTURE) ×2 IMPLANT
SYR CONTROL 10ML LL (SYRINGE) ×3 IMPLANT
TOWEL GREEN STERILE (TOWEL DISPOSABLE) ×3 IMPLANT
TOWEL GREEN STERILE FF (TOWEL DISPOSABLE) ×3 IMPLANT

## 2022-03-14 NOTE — Discharge Instructions (Signed)
[facility] Office Phone Number [phone]  POST OP INSTRUCTIONS Take 400 mg of ibuprofen every 8 hours or 650 mg tylenol every 6 hours for next 72 hours then as needed. Use ice several times daily also.  A prescription for pain medication may be given to you upon discharge.  Take your pain medication as prescribed, if needed.  If narcotic pain medicine is not needed, then you may take acetaminophen (Tylenol), naprosyn (Alleve) or ibuprofen (Advil) as needed. Take your usually prescribed medications unless otherwise directed If you need a refill on your pain medication, please contact your pharmacy.  They will contact our office to request authorization.  Prescriptions will not be filled after 5pm or on week-ends. You should eat very light the first 24 hours after surgery, such as soup, crackers, pudding, etc.  Resume your normal diet the day after surgery. Most patients will experience some swelling and bruising in the breast.  Ice packs and a good support bra will help.  Wear the breast binder provided or a sports bra for 72 hours day and night.  After that wear a sports bra during the day until you return to the office. Swelling and bruising can take several days to resolve.  It is common to experience some constipation if taking pain medication after surgery.  Increasing fluid intake and taking a stool softener will usually help or prevent this problem from occurring.  A mild laxative (Milk of Magnesia or Miralax) should be taken according to package directions if there are no bowel movements after 48 hours. I used skin glue on the incision, you may shower in 24 hours.  The glue will flake off over the next 2-3 weeks.  Any sutures or staples will be removed at the office during your follow-up visit. ACTIVITIES:  You may resume regular daily activities (gradually increasing) beginning the next day.  Wearing a good support bra or sports bra minimizes pain and swelling.  You may have  sexual intercourse when it is comfortable. You may drive when you no longer are taking prescription pain medication, you can comfortably wear a seatbelt, and you can safely maneuver your car and apply brakes. RETURN TO WORK:  ______________________________________________________________________________________ You should see your doctor in the office for a follow-up appointment approximately two weeks after your surgery.  Your doctor's nurse will typically make your follow-up appointment when she calls you with your pathology report.  Expect your pathology report 3-4 business days after your surgery.  You may call to check if you do not hear from us after three days. OTHER INSTRUCTIONS: _______________________________________________________________________________________________ _____________________________________________________________________________________________________________________________________ _____________________________________________________________________________________________________________________________________ _____________________________________________________________________________________________________________________________________  WHEN TO CALL DR Ichelle Harral: Fever over 101.0 Nausea and/or vomiting. Extreme swelling or bruising. Continued bleeding from incision. Increased pain, redness, or drainage from the incision.  The clinic staff is available to answer your questions during regular business hours.  Please don't hesitate to call and ask to speak to one of the nurses for clinical concerns.  If you have a medical emergency, go to the nearest emergency room or call 911.  A surgeon from [facility] is always on call at the hospital.  For further questions, please visit [email or web address]  

## 2022-03-14 NOTE — Interval H&P Note (Signed)
History and Physical Interval Note:  03/14/2022 8:23 AM  [name]  has presented today for surgery, with the diagnosis of LEFT BREAST DUCTAL CARCINOMA IN SITU.  The various methods of treatment have been discussed with the patient and family. After consideration of risks, benefits and other options for treatment, the patient has consented to  Procedure(s) with comments: LEFT BREAST SEED GUIDED LUMPECTOMY (Left) - LMA as a surgical intervention.  The patient's history has been reviewed, patient examined, no change in status, stable for surgery.  I have reviewed the patient's chart and labs.  Questions were answered to the patient's satisfaction.     Rolm Bookbinder

## 2022-03-14 NOTE — Op Note (Signed)
Preoperative diagnosis: Left breast cancer, clinical stage 0 Postoperative diagnosis: Same as above Procedure: Left breast radioactive seed guided lumpectomy Surgeon: Dr. Serita Grammes Estimated blood loss: Minimal Complications: None Drains: None Specimens: 1.  Left breast tissue containing seed and clip marked with paint 2.  Additional medial and posterior margins marked short stitch superior, long stitch lateral, double stitch deep Sponge and count was correct completion Disposition to recovery stable condition  Indications: 76 y.o. female who is seen today for new breast cancer. In 2020 she underwent a left breast biopsy for discordant core that ended up showing an 8 mm invasive ductal carcinoma that was ER positive, PR negative, HER2 negative, and Ki-67 was 5%. She sought radiation therapy in [place] and did not proceed with this. She has been on anastrozole since then. In May 2023 she underwent a mammogram that showed 2.5 cm of area of calcifications in the upper outer quadrant. This is undergone a biopsy and is ductal carcinoma in situ, intermediate grade. This is ER positive at 40% and PR positive at greater than 1%. She also had an additional 3 mm group of calcifications that was near her old scar that is benign breast tissue and microcalcifications were present. This was read as concordant. We discussed a lumpetomy.   Procedure: After informed consent was obtained she first had a seed placed at the breast center.  I had these mammograms in the operating room.  She was given antibiotics.  SCDs were placed.  She was placed under general anesthesia without complication.  She was prepped and draped in the standard sterile surgical fashion.  I filtrated Marcaine throughout the left upper outer quadrant.  I then made a curvilinear incision overlying the tumor.  I dissected down and identified the seed.  I removed the seed and the surrounding tissue in that attempt to get a clear margin.  I then  did a mammogram confirmed removal of the seed and the clip.  The 3D images showed it look like I was close to 2 margins.  I removed additional medial and posterior margins.   These were marked as above.  I then obtained hemostasis.  I closed the cavity with 2-0 Vicryl.  The skin was closed with 3-0 Vicryl and 4-0 Monocryl.  Glue Steri-Strips were applied.  She tolerated this well was extubated and transferred recovery stable.

## 2022-03-14 NOTE — Anesthesia Postprocedure Evaluation (Signed)
Anesthesia Post Note  Patient: [name]  Procedure(s) Performed: LEFT BREAST SEED GUIDED LUMPECTOMY (Left: Breast)     Patient location during evaluation: PACU Anesthesia Type: General Level of consciousness: awake and alert Pain management: pain level controlled Vital Signs Assessment: post-procedure vital signs reviewed and stable Respiratory status: spontaneous breathing, nonlabored ventilation, respiratory function stable and patient connected to nasal cannula oxygen Cardiovascular status: blood pressure returned to baseline and stable Postop Assessment: no apparent nausea or vomiting Anesthetic complications: no   No notable events documented.  Last Vitals:  Vitals:   03/14/22 0943 03/14/22 0958  BP: 102/79 (!) 119/50  Pulse: (!) 55 (!) 56  Resp: 17 18  Temp:  36.8 C  SpO2: 93% 94%    Last Pain:  Vitals:   03/14/22 0958  TempSrc:   PainSc: 0-No pain                 Genowefa Morga S

## 2022-03-14 NOTE — Anesthesia Procedure Notes (Signed)
Procedure Name: LMA Insertion Date/Time: 03/14/2022 8:39 AM  Performed by: Leonor Liv, CRNAPre-anesthesia Checklist: Patient identified, Emergency Drugs available, Suction available and Patient being monitored Patient Re-evaluated:Patient Re-evaluated prior to induction Oxygen Delivery Method: Circle System Utilized Preoxygenation: Pre-oxygenation with 100% oxygen Induction Type: IV induction Ventilation: Mask ventilation without difficulty LMA: LMA inserted LMA Size: 4.0 Number of attempts: 1 Placement Confirmation: positive ETCO2 Tube secured with: Tape Dental Injury: Teeth and Oropharynx as per pre-operative assessment

## 2022-03-14 NOTE — Transfer of Care (Signed)
Immediate Anesthesia Transfer of Care Note  Patient: [name]  Procedure(s) Performed: LEFT BREAST SEED GUIDED LUMPECTOMY (Left: Breast)  Patient Location: PACU  Anesthesia Type:General  Level of Consciousness: awake, alert  and oriented  Airway & Oxygen Therapy: Patient Spontanous Breathing  Post-op Assessment: Report given to RN and Post -op Vital signs reviewed and stable  Post vital signs: Reviewed and stable  Last Vitals:  Vitals Value Taken Time  BP 122/50 03/14/22 0928  Temp    Pulse 62 03/14/22 0930  Resp 20 03/14/22 0930  SpO2 91 % 03/14/22 0930  Vitals shown include unvalidated device data.  Last Pain:  Vitals:   03/14/22 0715  TempSrc:   PainSc: 6       Patients Stated Pain Goal: 3 (14/97/02 6378)  Complications: No notable events documented.

## 2022-03-14 NOTE — H&P (Signed)
76 y.o. female who is seen today for new breast cancer. She has multiple medical problems. In 2020 she underwent a left breast biopsy for discordant core that ended up showing an 8 mm invasive ductal carcinoma that was ER positive, PR negative, HER2 negative, and Ki-67 was 5%. She sought radiation therapy in [place] and did not proceed with this. She has been on anastrozole since then. In May 2023 she underwent a mammogram that showed 2.5 cm of area of calcifications in the upper outer quadrant. This is undergone a biopsy and is ductal carcinoma in situ, intermediate grade. This is ER positive at 40% and PR positive at greater than 1%. She also had an additional 3 mm group of calcifications that was near her old scar that is benign breast tissue and microcalcifications were present. This was read as concordant. She comes here today with her daughters to discuss her options.  Review of Systems: A complete review of systems was obtained from the patient. I have reviewed this information and discussed as appropriate with the patient. See HPI as well for other ROS.  Review of Systems  All other systems reviewed and are negative.   Medical History: Past Medical History:  Diagnosis Date  Arthritis  Chronic kidney disease  Diabetes mellitus without complication (CMS-HCC)  History of cancer  Hypertension   There is no problem list on file for this patient.  Past Surgical History:  Procedure Laterality Date  MASTECTOMY PARTIAL / LUMPECTOMY Left   No Known Allergies  Current Outpatient Medications on File Prior to Visit  Medication Sig Dispense Refill  allopurinoL (ZYLOPRIM) 100 MG tablet Take 100 mg by mouth at bedtime  amLODIPine (NORVASC) 10 MG tablet Take 10 mg by mouth once daily  anastrozole (ARIMIDEX) 1 mg tablet Take 1 tablet by mouth once daily  ascorbic acid, vitamin C, 500 mg PwPk Take by mouth  aspirin 81 MG EC tablet Take by mouth  atorvastatin (LIPITOR) 40 MG tablet Take 1 tablet  by mouth once daily  cholecalciferol (VITAMIN D3) 2,000 unit tablet Take by mouth  cyanocobalamin (VITAMIN B12) 500 MCG tablet Take by mouth  hydrALAZINE (APRESOLINE) 25 MG tablet Take 25 mg by mouth 2 (two) times daily  lisinopriL (ZESTRIL) 40 MG tablet TAKE 1 TABLET(S) ORAL EVERY DAY FOR BLOOD PRESSURE  metFORMIN (GLUCOPHAGE) 500 MG tablet Take by mouth  metoprolol tartrate (LOPRESSOR) 50 MG tablet Take 1 tablet by mouth 2 (two) times daily  nitroGLYcerin (NITROSTAT) 0.4 MG SL tablet Place 0.4 mg under the tongue every 5 (five) minutes as needed    Family History  Problem Relation Age of Onset  High blood pressure (Hypertension) Brother  Coronary Artery Disease (Blocked arteries around heart) Brother   Social History   Tobacco Use  Smoking Status Never  Smokeless Tobacco Never  Marital status: Widowed  Tobacco Use  Smoking status: Never  Smokeless tobacco: Never  Substance and Sexual Activity  Alcohol use: Not Currently  Drug use: Not Currently   Objective:   Vitals:  02/22/22 1022  BP: (!) 142/78  Pulse: 77  Weight: 98 kg (216 lb)  Height: 157.5 cm (_0 )   Body mass index is 39.51 kg/m.  Physical Exam Vitals reviewed.  Constitutional:  Appearance: Normal appearance.  Chest:  Breasts: Right: No inverted nipple, mass or nipple discharge.  Left: No inverted nipple, mass or nipple discharge.  Lymphadenopathy:  Upper Body:  Right upper body: No supraclavicular or axillary adenopathy.  Left upper body: No supraclavicular  or axillary adenopathy.  Neurological:  Mental Status: She is alert.    Assessment and Plan:   Ductal carcinoma in situ (DCIS) of left breast  Left breast seed guided lumpectomy  She didn't get radiotherapy before. No other abnormalities. I think would be reasonable to do lumpectomy for this new finding. I do think radiotherapy afterwards should be strongly considered. We discussed other options such as mastectomy but she does not want to  pursue this and with her other medical issues I think lumpectomy a better option. Will have her see rad onc at [facility] once surgery is done and will continue on anastrozole with Dr Raliegh Ip. Plan to schedule soon

## 2022-03-15 ENCOUNTER — Encounter (HOSPITAL_COMMUNITY): Payer: Self-pay | Admitting: General Surgery

## 2022-03-19 LAB — SURGICAL PATHOLOGY

## 2022-03-27 ENCOUNTER — Ambulatory Visit: Payer: Medicare HMO | Admitting: Cardiology

## 2022-03-28 ENCOUNTER — Encounter (HOSPITAL_COMMUNITY): Payer: Self-pay

## 2022-03-30 DIAGNOSIS — I129 Hypertensive chronic kidney disease with stage 1 through stage 4 chronic kidney disease, or unspecified chronic kidney disease: Secondary | ICD-10-CM | POA: Diagnosis not present

## 2022-03-30 DIAGNOSIS — N189 Chronic kidney disease, unspecified: Secondary | ICD-10-CM | POA: Diagnosis not present

## 2022-03-30 DIAGNOSIS — E1122 Type 2 diabetes mellitus with diabetic chronic kidney disease: Secondary | ICD-10-CM | POA: Diagnosis not present

## 2022-04-05 DIAGNOSIS — N189 Chronic kidney disease, unspecified: Secondary | ICD-10-CM | POA: Diagnosis not present

## 2022-04-05 DIAGNOSIS — E1122 Type 2 diabetes mellitus with diabetic chronic kidney disease: Secondary | ICD-10-CM | POA: Diagnosis not present

## 2022-04-05 DIAGNOSIS — Z6839 Body mass index (BMI) 39.0-39.9, adult: Secondary | ICD-10-CM | POA: Diagnosis not present

## 2022-04-05 DIAGNOSIS — I129 Hypertensive chronic kidney disease with stage 1 through stage 4 chronic kidney disease, or unspecified chronic kidney disease: Secondary | ICD-10-CM | POA: Diagnosis not present

## 2022-04-12 DIAGNOSIS — D0512 Intraductal carcinoma in situ of left breast: Secondary | ICD-10-CM | POA: Diagnosis not present

## 2022-04-12 DIAGNOSIS — Z923 Personal history of irradiation: Secondary | ICD-10-CM | POA: Diagnosis not present

## 2022-04-12 DIAGNOSIS — Z90710 Acquired absence of both cervix and uterus: Secondary | ICD-10-CM | POA: Diagnosis not present

## 2022-04-12 DIAGNOSIS — Z5181 Encounter for therapeutic drug level monitoring: Secondary | ICD-10-CM | POA: Diagnosis not present

## 2022-04-12 DIAGNOSIS — Z170 Estrogen receptor positive status [ER+]: Secondary | ICD-10-CM | POA: Diagnosis not present

## 2022-04-12 DIAGNOSIS — Z9012 Acquired absence of left breast and nipple: Secondary | ICD-10-CM | POA: Diagnosis not present

## 2022-04-12 DIAGNOSIS — C50312 Malignant neoplasm of lower-inner quadrant of left female breast: Secondary | ICD-10-CM | POA: Diagnosis not present

## 2022-04-12 DIAGNOSIS — Z79899 Other long term (current) drug therapy: Secondary | ICD-10-CM | POA: Diagnosis not present

## 2022-04-17 DIAGNOSIS — Z9012 Acquired absence of left breast and nipple: Secondary | ICD-10-CM | POA: Diagnosis not present

## 2022-04-17 DIAGNOSIS — C50312 Malignant neoplasm of lower-inner quadrant of left female breast: Secondary | ICD-10-CM | POA: Diagnosis not present

## 2022-04-17 DIAGNOSIS — Z923 Personal history of irradiation: Secondary | ICD-10-CM | POA: Diagnosis not present

## 2022-04-17 DIAGNOSIS — D0512 Intraductal carcinoma in situ of left breast: Secondary | ICD-10-CM | POA: Diagnosis not present

## 2022-04-17 DIAGNOSIS — Z170 Estrogen receptor positive status [ER+]: Secondary | ICD-10-CM | POA: Diagnosis not present

## 2022-04-17 DIAGNOSIS — Z79899 Other long term (current) drug therapy: Secondary | ICD-10-CM | POA: Diagnosis not present

## 2022-04-17 DIAGNOSIS — Z5181 Encounter for therapeutic drug level monitoring: Secondary | ICD-10-CM | POA: Diagnosis not present

## 2022-04-17 DIAGNOSIS — Z90710 Acquired absence of both cervix and uterus: Secondary | ICD-10-CM | POA: Diagnosis not present

## 2022-04-24 DIAGNOSIS — Z90710 Acquired absence of both cervix and uterus: Secondary | ICD-10-CM | POA: Diagnosis not present

## 2022-04-24 DIAGNOSIS — Z5181 Encounter for therapeutic drug level monitoring: Secondary | ICD-10-CM | POA: Diagnosis not present

## 2022-04-24 DIAGNOSIS — C50312 Malignant neoplasm of lower-inner quadrant of left female breast: Secondary | ICD-10-CM | POA: Diagnosis not present

## 2022-04-24 DIAGNOSIS — Z170 Estrogen receptor positive status [ER+]: Secondary | ICD-10-CM | POA: Diagnosis not present

## 2022-04-24 DIAGNOSIS — D0512 Intraductal carcinoma in situ of left breast: Secondary | ICD-10-CM | POA: Diagnosis not present

## 2022-04-24 DIAGNOSIS — Z923 Personal history of irradiation: Secondary | ICD-10-CM | POA: Diagnosis not present

## 2022-04-24 DIAGNOSIS — Z9012 Acquired absence of left breast and nipple: Secondary | ICD-10-CM | POA: Diagnosis not present

## 2022-04-24 DIAGNOSIS — Z79899 Other long term (current) drug therapy: Secondary | ICD-10-CM | POA: Diagnosis not present

## 2022-04-26 DIAGNOSIS — D0512 Intraductal carcinoma in situ of left breast: Secondary | ICD-10-CM | POA: Diagnosis not present

## 2022-05-01 DIAGNOSIS — Z90710 Acquired absence of both cervix and uterus: Secondary | ICD-10-CM | POA: Diagnosis not present

## 2022-05-01 DIAGNOSIS — Z5181 Encounter for therapeutic drug level monitoring: Secondary | ICD-10-CM | POA: Diagnosis not present

## 2022-05-01 DIAGNOSIS — Z9012 Acquired absence of left breast and nipple: Secondary | ICD-10-CM | POA: Diagnosis not present

## 2022-05-01 DIAGNOSIS — Z79899 Other long term (current) drug therapy: Secondary | ICD-10-CM | POA: Diagnosis not present

## 2022-05-01 DIAGNOSIS — C50312 Malignant neoplasm of lower-inner quadrant of left female breast: Secondary | ICD-10-CM | POA: Diagnosis not present

## 2022-05-01 DIAGNOSIS — Z170 Estrogen receptor positive status [ER+]: Secondary | ICD-10-CM | POA: Diagnosis not present

## 2022-05-01 DIAGNOSIS — Z923 Personal history of irradiation: Secondary | ICD-10-CM | POA: Diagnosis not present

## 2022-05-01 DIAGNOSIS — D0512 Intraductal carcinoma in situ of left breast: Secondary | ICD-10-CM | POA: Diagnosis not present

## 2022-05-02 DIAGNOSIS — C50312 Malignant neoplasm of lower-inner quadrant of left female breast: Secondary | ICD-10-CM | POA: Diagnosis not present

## 2022-05-02 DIAGNOSIS — Z79899 Other long term (current) drug therapy: Secondary | ICD-10-CM | POA: Diagnosis not present

## 2022-05-02 DIAGNOSIS — Z923 Personal history of irradiation: Secondary | ICD-10-CM | POA: Diagnosis not present

## 2022-05-02 DIAGNOSIS — Z170 Estrogen receptor positive status [ER+]: Secondary | ICD-10-CM | POA: Diagnosis not present

## 2022-05-02 DIAGNOSIS — D0512 Intraductal carcinoma in situ of left breast: Secondary | ICD-10-CM | POA: Diagnosis not present

## 2022-05-02 DIAGNOSIS — Z90710 Acquired absence of both cervix and uterus: Secondary | ICD-10-CM | POA: Diagnosis not present

## 2022-05-02 DIAGNOSIS — Z5181 Encounter for therapeutic drug level monitoring: Secondary | ICD-10-CM | POA: Diagnosis not present

## 2022-05-02 DIAGNOSIS — Z9012 Acquired absence of left breast and nipple: Secondary | ICD-10-CM | POA: Diagnosis not present

## 2022-05-03 DIAGNOSIS — Z9012 Acquired absence of left breast and nipple: Secondary | ICD-10-CM | POA: Diagnosis not present

## 2022-05-03 DIAGNOSIS — Z5181 Encounter for therapeutic drug level monitoring: Secondary | ICD-10-CM | POA: Diagnosis not present

## 2022-05-03 DIAGNOSIS — Z170 Estrogen receptor positive status [ER+]: Secondary | ICD-10-CM | POA: Diagnosis not present

## 2022-05-03 DIAGNOSIS — Z90710 Acquired absence of both cervix and uterus: Secondary | ICD-10-CM | POA: Diagnosis not present

## 2022-05-03 DIAGNOSIS — Z923 Personal history of irradiation: Secondary | ICD-10-CM | POA: Diagnosis not present

## 2022-05-03 DIAGNOSIS — C50312 Malignant neoplasm of lower-inner quadrant of left female breast: Secondary | ICD-10-CM | POA: Diagnosis not present

## 2022-05-03 DIAGNOSIS — Z79899 Other long term (current) drug therapy: Secondary | ICD-10-CM | POA: Diagnosis not present

## 2022-05-03 DIAGNOSIS — D0512 Intraductal carcinoma in situ of left breast: Secondary | ICD-10-CM | POA: Diagnosis not present

## 2022-05-04 DIAGNOSIS — Z90710 Acquired absence of both cervix and uterus: Secondary | ICD-10-CM | POA: Diagnosis not present

## 2022-05-04 DIAGNOSIS — Z9012 Acquired absence of left breast and nipple: Secondary | ICD-10-CM | POA: Diagnosis not present

## 2022-05-04 DIAGNOSIS — D0512 Intraductal carcinoma in situ of left breast: Secondary | ICD-10-CM | POA: Diagnosis not present

## 2022-05-04 DIAGNOSIS — Z79899 Other long term (current) drug therapy: Secondary | ICD-10-CM | POA: Diagnosis not present

## 2022-05-04 DIAGNOSIS — Z5181 Encounter for therapeutic drug level monitoring: Secondary | ICD-10-CM | POA: Diagnosis not present

## 2022-05-04 DIAGNOSIS — Z170 Estrogen receptor positive status [ER+]: Secondary | ICD-10-CM | POA: Diagnosis not present

## 2022-05-04 DIAGNOSIS — Z923 Personal history of irradiation: Secondary | ICD-10-CM | POA: Diagnosis not present

## 2022-05-04 DIAGNOSIS — C50312 Malignant neoplasm of lower-inner quadrant of left female breast: Secondary | ICD-10-CM | POA: Diagnosis not present

## 2022-05-07 DIAGNOSIS — Z79899 Other long term (current) drug therapy: Secondary | ICD-10-CM | POA: Diagnosis not present

## 2022-05-07 DIAGNOSIS — C50312 Malignant neoplasm of lower-inner quadrant of left female breast: Secondary | ICD-10-CM | POA: Diagnosis not present

## 2022-05-07 DIAGNOSIS — Z90710 Acquired absence of both cervix and uterus: Secondary | ICD-10-CM | POA: Diagnosis not present

## 2022-05-07 DIAGNOSIS — Z5181 Encounter for therapeutic drug level monitoring: Secondary | ICD-10-CM | POA: Diagnosis not present

## 2022-05-07 DIAGNOSIS — Z170 Estrogen receptor positive status [ER+]: Secondary | ICD-10-CM | POA: Diagnosis not present

## 2022-05-07 DIAGNOSIS — Z9012 Acquired absence of left breast and nipple: Secondary | ICD-10-CM | POA: Diagnosis not present

## 2022-05-07 DIAGNOSIS — D0512 Intraductal carcinoma in situ of left breast: Secondary | ICD-10-CM | POA: Diagnosis not present

## 2022-05-07 DIAGNOSIS — Z923 Personal history of irradiation: Secondary | ICD-10-CM | POA: Diagnosis not present

## 2022-05-08 DIAGNOSIS — Z5181 Encounter for therapeutic drug level monitoring: Secondary | ICD-10-CM | POA: Diagnosis not present

## 2022-05-08 DIAGNOSIS — Z923 Personal history of irradiation: Secondary | ICD-10-CM | POA: Diagnosis not present

## 2022-05-08 DIAGNOSIS — Z9012 Acquired absence of left breast and nipple: Secondary | ICD-10-CM | POA: Diagnosis not present

## 2022-05-08 DIAGNOSIS — Z90710 Acquired absence of both cervix and uterus: Secondary | ICD-10-CM | POA: Diagnosis not present

## 2022-05-08 DIAGNOSIS — Z170 Estrogen receptor positive status [ER+]: Secondary | ICD-10-CM | POA: Diagnosis not present

## 2022-05-08 DIAGNOSIS — D0512 Intraductal carcinoma in situ of left breast: Secondary | ICD-10-CM | POA: Diagnosis not present

## 2022-05-08 DIAGNOSIS — C50312 Malignant neoplasm of lower-inner quadrant of left female breast: Secondary | ICD-10-CM | POA: Diagnosis not present

## 2022-05-08 DIAGNOSIS — Z79899 Other long term (current) drug therapy: Secondary | ICD-10-CM | POA: Diagnosis not present

## 2022-05-09 DIAGNOSIS — Z90710 Acquired absence of both cervix and uterus: Secondary | ICD-10-CM | POA: Diagnosis not present

## 2022-05-09 DIAGNOSIS — Z5181 Encounter for therapeutic drug level monitoring: Secondary | ICD-10-CM | POA: Diagnosis not present

## 2022-05-09 DIAGNOSIS — Z79899 Other long term (current) drug therapy: Secondary | ICD-10-CM | POA: Diagnosis not present

## 2022-05-09 DIAGNOSIS — C50312 Malignant neoplasm of lower-inner quadrant of left female breast: Secondary | ICD-10-CM | POA: Diagnosis not present

## 2022-05-09 DIAGNOSIS — Z9012 Acquired absence of left breast and nipple: Secondary | ICD-10-CM | POA: Diagnosis not present

## 2022-05-09 DIAGNOSIS — Z170 Estrogen receptor positive status [ER+]: Secondary | ICD-10-CM | POA: Diagnosis not present

## 2022-05-09 DIAGNOSIS — D0512 Intraductal carcinoma in situ of left breast: Secondary | ICD-10-CM | POA: Diagnosis not present

## 2022-05-09 DIAGNOSIS — Z923 Personal history of irradiation: Secondary | ICD-10-CM | POA: Diagnosis not present

## 2022-05-10 DIAGNOSIS — Z9012 Acquired absence of left breast and nipple: Secondary | ICD-10-CM | POA: Diagnosis not present

## 2022-05-10 DIAGNOSIS — Z5181 Encounter for therapeutic drug level monitoring: Secondary | ICD-10-CM | POA: Diagnosis not present

## 2022-05-10 DIAGNOSIS — Z923 Personal history of irradiation: Secondary | ICD-10-CM | POA: Diagnosis not present

## 2022-05-10 DIAGNOSIS — Z90710 Acquired absence of both cervix and uterus: Secondary | ICD-10-CM | POA: Diagnosis not present

## 2022-05-10 DIAGNOSIS — Z170 Estrogen receptor positive status [ER+]: Secondary | ICD-10-CM | POA: Diagnosis not present

## 2022-05-10 DIAGNOSIS — D0512 Intraductal carcinoma in situ of left breast: Secondary | ICD-10-CM | POA: Diagnosis not present

## 2022-05-10 DIAGNOSIS — Z79899 Other long term (current) drug therapy: Secondary | ICD-10-CM | POA: Diagnosis not present

## 2022-05-10 DIAGNOSIS — C50312 Malignant neoplasm of lower-inner quadrant of left female breast: Secondary | ICD-10-CM | POA: Diagnosis not present

## 2022-05-11 DIAGNOSIS — Z79899 Other long term (current) drug therapy: Secondary | ICD-10-CM | POA: Diagnosis not present

## 2022-05-11 DIAGNOSIS — Z90710 Acquired absence of both cervix and uterus: Secondary | ICD-10-CM | POA: Diagnosis not present

## 2022-05-11 DIAGNOSIS — Z170 Estrogen receptor positive status [ER+]: Secondary | ICD-10-CM | POA: Diagnosis not present

## 2022-05-11 DIAGNOSIS — Z9012 Acquired absence of left breast and nipple: Secondary | ICD-10-CM | POA: Diagnosis not present

## 2022-05-11 DIAGNOSIS — D0512 Intraductal carcinoma in situ of left breast: Secondary | ICD-10-CM | POA: Diagnosis not present

## 2022-05-11 DIAGNOSIS — C50312 Malignant neoplasm of lower-inner quadrant of left female breast: Secondary | ICD-10-CM | POA: Diagnosis not present

## 2022-05-11 DIAGNOSIS — Z923 Personal history of irradiation: Secondary | ICD-10-CM | POA: Diagnosis not present

## 2022-05-11 DIAGNOSIS — Z5181 Encounter for therapeutic drug level monitoring: Secondary | ICD-10-CM | POA: Diagnosis not present

## 2022-05-14 DIAGNOSIS — C50312 Malignant neoplasm of lower-inner quadrant of left female breast: Secondary | ICD-10-CM | POA: Diagnosis not present

## 2022-05-14 DIAGNOSIS — Z9012 Acquired absence of left breast and nipple: Secondary | ICD-10-CM | POA: Diagnosis not present

## 2022-05-14 DIAGNOSIS — Z5181 Encounter for therapeutic drug level monitoring: Secondary | ICD-10-CM | POA: Diagnosis not present

## 2022-05-14 DIAGNOSIS — Z923 Personal history of irradiation: Secondary | ICD-10-CM | POA: Diagnosis not present

## 2022-05-14 DIAGNOSIS — D0512 Intraductal carcinoma in situ of left breast: Secondary | ICD-10-CM | POA: Diagnosis not present

## 2022-05-14 DIAGNOSIS — Z79899 Other long term (current) drug therapy: Secondary | ICD-10-CM | POA: Diagnosis not present

## 2022-05-14 DIAGNOSIS — Z90710 Acquired absence of both cervix and uterus: Secondary | ICD-10-CM | POA: Diagnosis not present

## 2022-05-14 DIAGNOSIS — Z170 Estrogen receptor positive status [ER+]: Secondary | ICD-10-CM | POA: Diagnosis not present

## 2022-05-15 DIAGNOSIS — Z923 Personal history of irradiation: Secondary | ICD-10-CM | POA: Diagnosis not present

## 2022-05-15 DIAGNOSIS — Z170 Estrogen receptor positive status [ER+]: Secondary | ICD-10-CM | POA: Diagnosis not present

## 2022-05-15 DIAGNOSIS — C50312 Malignant neoplasm of lower-inner quadrant of left female breast: Secondary | ICD-10-CM | POA: Diagnosis not present

## 2022-05-15 DIAGNOSIS — Z79899 Other long term (current) drug therapy: Secondary | ICD-10-CM | POA: Diagnosis not present

## 2022-05-15 DIAGNOSIS — Z9012 Acquired absence of left breast and nipple: Secondary | ICD-10-CM | POA: Diagnosis not present

## 2022-05-15 DIAGNOSIS — D0512 Intraductal carcinoma in situ of left breast: Secondary | ICD-10-CM | POA: Diagnosis not present

## 2022-05-15 DIAGNOSIS — Z90710 Acquired absence of both cervix and uterus: Secondary | ICD-10-CM | POA: Diagnosis not present

## 2022-05-15 DIAGNOSIS — Z5181 Encounter for therapeutic drug level monitoring: Secondary | ICD-10-CM | POA: Diagnosis not present

## 2022-05-16 DIAGNOSIS — D0512 Intraductal carcinoma in situ of left breast: Secondary | ICD-10-CM | POA: Diagnosis not present

## 2022-05-16 DIAGNOSIS — C50312 Malignant neoplasm of lower-inner quadrant of left female breast: Secondary | ICD-10-CM | POA: Diagnosis not present

## 2022-05-16 DIAGNOSIS — Z90710 Acquired absence of both cervix and uterus: Secondary | ICD-10-CM | POA: Diagnosis not present

## 2022-05-16 DIAGNOSIS — Z9012 Acquired absence of left breast and nipple: Secondary | ICD-10-CM | POA: Diagnosis not present

## 2022-05-16 DIAGNOSIS — Z5181 Encounter for therapeutic drug level monitoring: Secondary | ICD-10-CM | POA: Diagnosis not present

## 2022-05-16 DIAGNOSIS — Z79899 Other long term (current) drug therapy: Secondary | ICD-10-CM | POA: Diagnosis not present

## 2022-05-16 DIAGNOSIS — Z170 Estrogen receptor positive status [ER+]: Secondary | ICD-10-CM | POA: Diagnosis not present

## 2022-05-16 DIAGNOSIS — Z923 Personal history of irradiation: Secondary | ICD-10-CM | POA: Diagnosis not present

## 2022-05-17 ENCOUNTER — Telehealth: Payer: Self-pay | Admitting: Cardiology

## 2022-05-17 DIAGNOSIS — Z9012 Acquired absence of left breast and nipple: Secondary | ICD-10-CM | POA: Diagnosis not present

## 2022-05-17 DIAGNOSIS — Z79899 Other long term (current) drug therapy: Secondary | ICD-10-CM | POA: Diagnosis not present

## 2022-05-17 DIAGNOSIS — Z170 Estrogen receptor positive status [ER+]: Secondary | ICD-10-CM | POA: Diagnosis not present

## 2022-05-17 DIAGNOSIS — Z923 Personal history of irradiation: Secondary | ICD-10-CM | POA: Diagnosis not present

## 2022-05-17 DIAGNOSIS — Z90710 Acquired absence of both cervix and uterus: Secondary | ICD-10-CM | POA: Diagnosis not present

## 2022-05-17 DIAGNOSIS — C50312 Malignant neoplasm of lower-inner quadrant of left female breast: Secondary | ICD-10-CM | POA: Diagnosis not present

## 2022-05-17 DIAGNOSIS — Z5181 Encounter for therapeutic drug level monitoring: Secondary | ICD-10-CM | POA: Diagnosis not present

## 2022-05-17 DIAGNOSIS — D0512 Intraductal carcinoma in situ of left breast: Secondary | ICD-10-CM | POA: Diagnosis not present

## 2022-05-17 NOTE — Telephone Encounter (Signed)
We do recommend you get the Covid vaccine. However, if you have additional questions please call your Primary Care doctor to discuss if the Vaccine is right for you. Patient informed and verbalized understanding of plan.

## 2022-05-17 NOTE — Telephone Encounter (Signed)
Patient calling to see if she can/need to take the new covid shot. Please advise

## 2022-05-18 DIAGNOSIS — Z79899 Other long term (current) drug therapy: Secondary | ICD-10-CM | POA: Diagnosis not present

## 2022-05-18 DIAGNOSIS — Z90710 Acquired absence of both cervix and uterus: Secondary | ICD-10-CM | POA: Diagnosis not present

## 2022-05-18 DIAGNOSIS — Z923 Personal history of irradiation: Secondary | ICD-10-CM | POA: Diagnosis not present

## 2022-05-18 DIAGNOSIS — Z5181 Encounter for therapeutic drug level monitoring: Secondary | ICD-10-CM | POA: Diagnosis not present

## 2022-05-18 DIAGNOSIS — C50312 Malignant neoplasm of lower-inner quadrant of left female breast: Secondary | ICD-10-CM | POA: Diagnosis not present

## 2022-05-18 DIAGNOSIS — Z170 Estrogen receptor positive status [ER+]: Secondary | ICD-10-CM | POA: Diagnosis not present

## 2022-05-18 DIAGNOSIS — Z9012 Acquired absence of left breast and nipple: Secondary | ICD-10-CM | POA: Diagnosis not present

## 2022-05-18 DIAGNOSIS — D0512 Intraductal carcinoma in situ of left breast: Secondary | ICD-10-CM | POA: Diagnosis not present

## 2022-05-20 ENCOUNTER — Other Ambulatory Visit (HOSPITAL_COMMUNITY): Payer: Self-pay | Admitting: Hematology

## 2022-05-20 DIAGNOSIS — C50312 Malignant neoplasm of lower-inner quadrant of left female breast: Secondary | ICD-10-CM

## 2022-05-21 ENCOUNTER — Other Ambulatory Visit: Payer: Self-pay | Admitting: *Deleted

## 2022-05-21 DIAGNOSIS — C50312 Malignant neoplasm of lower-inner quadrant of left female breast: Secondary | ICD-10-CM | POA: Diagnosis not present

## 2022-05-21 DIAGNOSIS — Z9012 Acquired absence of left breast and nipple: Secondary | ICD-10-CM | POA: Diagnosis not present

## 2022-05-21 DIAGNOSIS — Z79899 Other long term (current) drug therapy: Secondary | ICD-10-CM | POA: Diagnosis not present

## 2022-05-21 DIAGNOSIS — Z90710 Acquired absence of both cervix and uterus: Secondary | ICD-10-CM | POA: Diagnosis not present

## 2022-05-21 DIAGNOSIS — D0512 Intraductal carcinoma in situ of left breast: Secondary | ICD-10-CM | POA: Diagnosis not present

## 2022-05-21 DIAGNOSIS — Z5181 Encounter for therapeutic drug level monitoring: Secondary | ICD-10-CM | POA: Diagnosis not present

## 2022-05-21 DIAGNOSIS — Z923 Personal history of irradiation: Secondary | ICD-10-CM | POA: Diagnosis not present

## 2022-05-21 DIAGNOSIS — Z170 Estrogen receptor positive status [ER+]: Secondary | ICD-10-CM | POA: Diagnosis not present

## 2022-05-21 NOTE — Telephone Encounter (Signed)
Anastrozole refill approved.  Patient tolerating and is to continue therapy. 

## 2022-05-22 DIAGNOSIS — Z923 Personal history of irradiation: Secondary | ICD-10-CM | POA: Diagnosis not present

## 2022-05-22 DIAGNOSIS — Z5181 Encounter for therapeutic drug level monitoring: Secondary | ICD-10-CM | POA: Diagnosis not present

## 2022-05-22 DIAGNOSIS — D0512 Intraductal carcinoma in situ of left breast: Secondary | ICD-10-CM | POA: Diagnosis not present

## 2022-05-22 DIAGNOSIS — Z79899 Other long term (current) drug therapy: Secondary | ICD-10-CM | POA: Diagnosis not present

## 2022-05-22 DIAGNOSIS — Z170 Estrogen receptor positive status [ER+]: Secondary | ICD-10-CM | POA: Diagnosis not present

## 2022-05-22 DIAGNOSIS — Z90710 Acquired absence of both cervix and uterus: Secondary | ICD-10-CM | POA: Diagnosis not present

## 2022-05-22 DIAGNOSIS — Z9012 Acquired absence of left breast and nipple: Secondary | ICD-10-CM | POA: Diagnosis not present

## 2022-05-22 DIAGNOSIS — C50312 Malignant neoplasm of lower-inner quadrant of left female breast: Secondary | ICD-10-CM | POA: Diagnosis not present

## 2022-05-23 DIAGNOSIS — Z79899 Other long term (current) drug therapy: Secondary | ICD-10-CM | POA: Diagnosis not present

## 2022-05-23 DIAGNOSIS — Z923 Personal history of irradiation: Secondary | ICD-10-CM | POA: Diagnosis not present

## 2022-05-23 DIAGNOSIS — Z5181 Encounter for therapeutic drug level monitoring: Secondary | ICD-10-CM | POA: Diagnosis not present

## 2022-05-23 DIAGNOSIS — Z90710 Acquired absence of both cervix and uterus: Secondary | ICD-10-CM | POA: Diagnosis not present

## 2022-05-23 DIAGNOSIS — C50312 Malignant neoplasm of lower-inner quadrant of left female breast: Secondary | ICD-10-CM | POA: Diagnosis not present

## 2022-05-23 DIAGNOSIS — Z170 Estrogen receptor positive status [ER+]: Secondary | ICD-10-CM | POA: Diagnosis not present

## 2022-05-23 DIAGNOSIS — D0512 Intraductal carcinoma in situ of left breast: Secondary | ICD-10-CM | POA: Diagnosis not present

## 2022-05-23 DIAGNOSIS — Z9012 Acquired absence of left breast and nipple: Secondary | ICD-10-CM | POA: Diagnosis not present

## 2022-05-24 DIAGNOSIS — Z170 Estrogen receptor positive status [ER+]: Secondary | ICD-10-CM | POA: Diagnosis not present

## 2022-05-24 DIAGNOSIS — C50312 Malignant neoplasm of lower-inner quadrant of left female breast: Secondary | ICD-10-CM | POA: Diagnosis not present

## 2022-05-24 DIAGNOSIS — Z90710 Acquired absence of both cervix and uterus: Secondary | ICD-10-CM | POA: Diagnosis not present

## 2022-05-24 DIAGNOSIS — D0512 Intraductal carcinoma in situ of left breast: Secondary | ICD-10-CM | POA: Diagnosis not present

## 2022-05-24 DIAGNOSIS — Z9012 Acquired absence of left breast and nipple: Secondary | ICD-10-CM | POA: Diagnosis not present

## 2022-05-24 DIAGNOSIS — Z923 Personal history of irradiation: Secondary | ICD-10-CM | POA: Diagnosis not present

## 2022-05-24 DIAGNOSIS — Z5181 Encounter for therapeutic drug level monitoring: Secondary | ICD-10-CM | POA: Diagnosis not present

## 2022-05-24 DIAGNOSIS — Z79899 Other long term (current) drug therapy: Secondary | ICD-10-CM | POA: Diagnosis not present

## 2022-05-25 DIAGNOSIS — M109 Gout, unspecified: Secondary | ICD-10-CM | POA: Diagnosis not present

## 2022-05-25 DIAGNOSIS — Z90710 Acquired absence of both cervix and uterus: Secondary | ICD-10-CM | POA: Diagnosis not present

## 2022-05-25 DIAGNOSIS — E1165 Type 2 diabetes mellitus with hyperglycemia: Secondary | ICD-10-CM | POA: Diagnosis not present

## 2022-05-25 DIAGNOSIS — Z9012 Acquired absence of left breast and nipple: Secondary | ICD-10-CM | POA: Diagnosis not present

## 2022-05-25 DIAGNOSIS — D0512 Intraductal carcinoma in situ of left breast: Secondary | ICD-10-CM | POA: Diagnosis not present

## 2022-05-25 DIAGNOSIS — E782 Mixed hyperlipidemia: Secondary | ICD-10-CM | POA: Diagnosis not present

## 2022-05-25 DIAGNOSIS — E559 Vitamin D deficiency, unspecified: Secondary | ICD-10-CM | POA: Diagnosis not present

## 2022-05-25 DIAGNOSIS — C50312 Malignant neoplasm of lower-inner quadrant of left female breast: Secondary | ICD-10-CM | POA: Diagnosis not present

## 2022-05-25 DIAGNOSIS — Z170 Estrogen receptor positive status [ER+]: Secondary | ICD-10-CM | POA: Diagnosis not present

## 2022-05-25 DIAGNOSIS — Z1329 Encounter for screening for other suspected endocrine disorder: Secondary | ICD-10-CM | POA: Diagnosis not present

## 2022-05-25 DIAGNOSIS — N183 Chronic kidney disease, stage 3 unspecified: Secondary | ICD-10-CM | POA: Diagnosis not present

## 2022-05-25 DIAGNOSIS — Z5181 Encounter for therapeutic drug level monitoring: Secondary | ICD-10-CM | POA: Diagnosis not present

## 2022-05-25 DIAGNOSIS — Z79899 Other long term (current) drug therapy: Secondary | ICD-10-CM | POA: Diagnosis not present

## 2022-05-25 DIAGNOSIS — Z923 Personal history of irradiation: Secondary | ICD-10-CM | POA: Diagnosis not present

## 2022-05-25 DIAGNOSIS — I10 Essential (primary) hypertension: Secondary | ICD-10-CM | POA: Diagnosis not present

## 2022-05-25 DIAGNOSIS — R5383 Other fatigue: Secondary | ICD-10-CM | POA: Diagnosis not present

## 2022-05-25 DIAGNOSIS — E7849 Other hyperlipidemia: Secondary | ICD-10-CM | POA: Diagnosis not present

## 2022-05-28 DIAGNOSIS — C50312 Malignant neoplasm of lower-inner quadrant of left female breast: Secondary | ICD-10-CM | POA: Diagnosis not present

## 2022-05-28 DIAGNOSIS — Z9012 Acquired absence of left breast and nipple: Secondary | ICD-10-CM | POA: Diagnosis not present

## 2022-05-28 DIAGNOSIS — Z170 Estrogen receptor positive status [ER+]: Secondary | ICD-10-CM | POA: Diagnosis not present

## 2022-05-28 DIAGNOSIS — Z923 Personal history of irradiation: Secondary | ICD-10-CM | POA: Diagnosis not present

## 2022-05-28 DIAGNOSIS — Z90710 Acquired absence of both cervix and uterus: Secondary | ICD-10-CM | POA: Diagnosis not present

## 2022-05-28 DIAGNOSIS — D0512 Intraductal carcinoma in situ of left breast: Secondary | ICD-10-CM | POA: Diagnosis not present

## 2022-05-28 DIAGNOSIS — Z79899 Other long term (current) drug therapy: Secondary | ICD-10-CM | POA: Diagnosis not present

## 2022-05-28 DIAGNOSIS — Z5181 Encounter for therapeutic drug level monitoring: Secondary | ICD-10-CM | POA: Diagnosis not present

## 2022-05-29 DIAGNOSIS — E7849 Other hyperlipidemia: Secondary | ICD-10-CM | POA: Diagnosis not present

## 2022-05-29 DIAGNOSIS — R030 Elevated blood-pressure reading, without diagnosis of hypertension: Secondary | ICD-10-CM | POA: Diagnosis not present

## 2022-05-29 DIAGNOSIS — Z923 Personal history of irradiation: Secondary | ICD-10-CM | POA: Diagnosis not present

## 2022-05-29 DIAGNOSIS — M65331 Trigger finger, right middle finger: Secondary | ICD-10-CM | POA: Diagnosis not present

## 2022-05-29 DIAGNOSIS — C50312 Malignant neoplasm of lower-inner quadrant of left female breast: Secondary | ICD-10-CM | POA: Diagnosis not present

## 2022-05-29 DIAGNOSIS — Z170 Estrogen receptor positive status [ER+]: Secondary | ICD-10-CM | POA: Diagnosis not present

## 2022-05-29 DIAGNOSIS — Z5181 Encounter for therapeutic drug level monitoring: Secondary | ICD-10-CM | POA: Diagnosis not present

## 2022-05-29 DIAGNOSIS — E1122 Type 2 diabetes mellitus with diabetic chronic kidney disease: Secondary | ICD-10-CM | POA: Diagnosis not present

## 2022-05-29 DIAGNOSIS — Z90710 Acquired absence of both cervix and uterus: Secondary | ICD-10-CM | POA: Diagnosis not present

## 2022-05-29 DIAGNOSIS — I10 Essential (primary) hypertension: Secondary | ICD-10-CM | POA: Diagnosis not present

## 2022-05-29 DIAGNOSIS — N183 Chronic kidney disease, stage 3 unspecified: Secondary | ICD-10-CM | POA: Diagnosis not present

## 2022-05-29 DIAGNOSIS — D0512 Intraductal carcinoma in situ of left breast: Secondary | ICD-10-CM | POA: Diagnosis not present

## 2022-05-29 DIAGNOSIS — I2510 Atherosclerotic heart disease of native coronary artery without angina pectoris: Secondary | ICD-10-CM | POA: Diagnosis not present

## 2022-05-29 DIAGNOSIS — Z6836 Body mass index (BMI) 36.0-36.9, adult: Secondary | ICD-10-CM | POA: Diagnosis not present

## 2022-05-29 DIAGNOSIS — Z955 Presence of coronary angioplasty implant and graft: Secondary | ICD-10-CM | POA: Diagnosis not present

## 2022-05-29 DIAGNOSIS — E1151 Type 2 diabetes mellitus with diabetic peripheral angiopathy without gangrene: Secondary | ICD-10-CM | POA: Diagnosis not present

## 2022-05-29 DIAGNOSIS — Z79899 Other long term (current) drug therapy: Secondary | ICD-10-CM | POA: Diagnosis not present

## 2022-05-29 DIAGNOSIS — Z9012 Acquired absence of left breast and nipple: Secondary | ICD-10-CM | POA: Diagnosis not present

## 2022-05-30 DIAGNOSIS — Z5181 Encounter for therapeutic drug level monitoring: Secondary | ICD-10-CM | POA: Diagnosis not present

## 2022-05-30 DIAGNOSIS — Z9012 Acquired absence of left breast and nipple: Secondary | ICD-10-CM | POA: Diagnosis not present

## 2022-05-30 DIAGNOSIS — Z90710 Acquired absence of both cervix and uterus: Secondary | ICD-10-CM | POA: Diagnosis not present

## 2022-05-30 DIAGNOSIS — C50312 Malignant neoplasm of lower-inner quadrant of left female breast: Secondary | ICD-10-CM | POA: Diagnosis not present

## 2022-05-30 DIAGNOSIS — D0512 Intraductal carcinoma in situ of left breast: Secondary | ICD-10-CM | POA: Diagnosis not present

## 2022-05-30 DIAGNOSIS — Z79899 Other long term (current) drug therapy: Secondary | ICD-10-CM | POA: Diagnosis not present

## 2022-05-30 DIAGNOSIS — Z170 Estrogen receptor positive status [ER+]: Secondary | ICD-10-CM | POA: Diagnosis not present

## 2022-05-30 DIAGNOSIS — Z923 Personal history of irradiation: Secondary | ICD-10-CM | POA: Diagnosis not present

## 2022-06-09 IMAGING — MG MM DIGITAL DIAGNOSTIC UNILAT*L* W/ TOMO W/ CAD
8 of 9 series · 8 of 13 positions shown · non-contrast
Comparison: Previous exam(s).

CLINICAL DATA: 76-year-old female for further evaluation of 2
groups of LEFT breast calcifications identified on screening
mammogram. History of lumpectomy for LEFT breast invasive ductal
carcinoma arising in a complex sclerosing lesion on 03/03/2019.

EXAM:
DIGITAL DIAGNOSTIC UNILATERAL LEFT MAMMOGRAM WITH TOMOSYNTHESIS AND
CAD
TECHNIQUE: Left digital diagnostic mammography and breast tomosynthesis was
performed. The images were evaluated with computer-aided detection.

[L CC (1 of 3)]
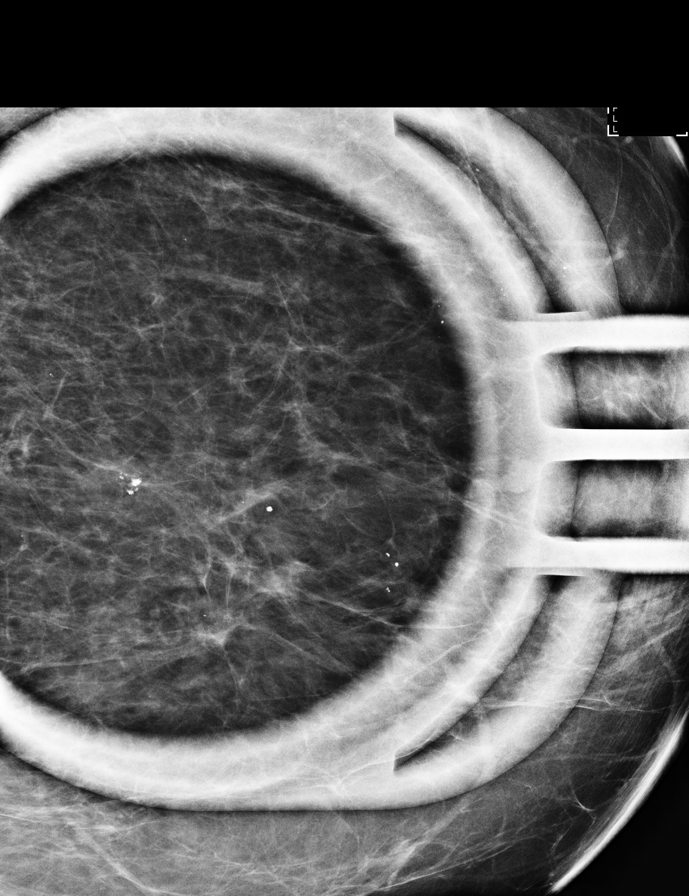

[L CC (2 of 3)]
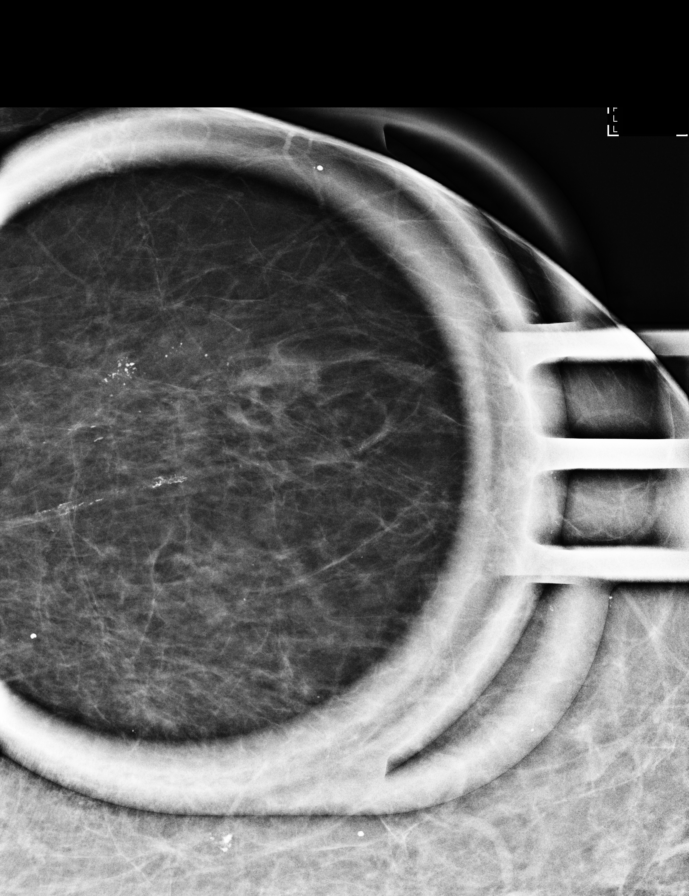

[L ML (1 of 4)]
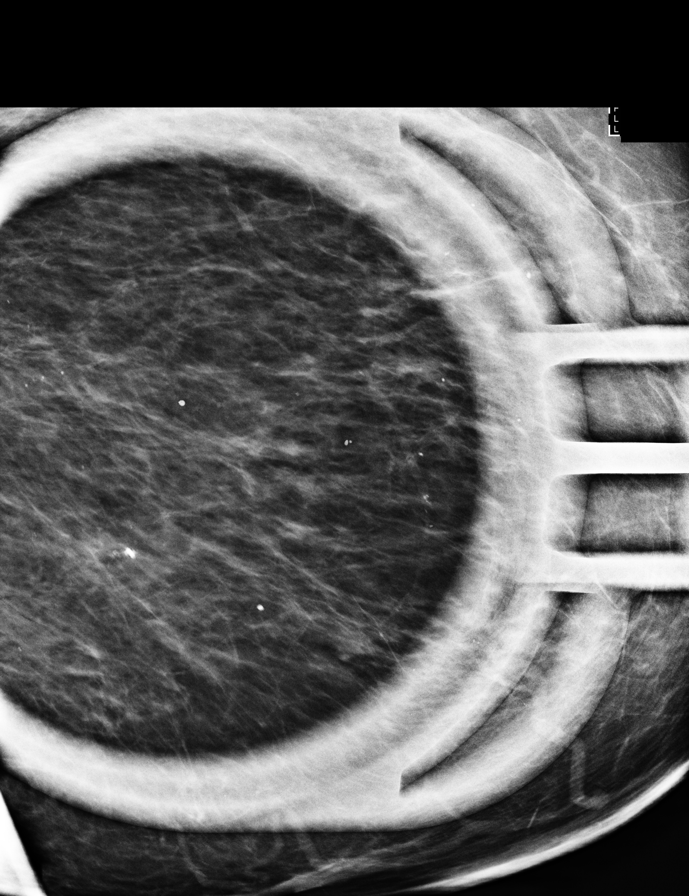

[L CC (3 of 3)]
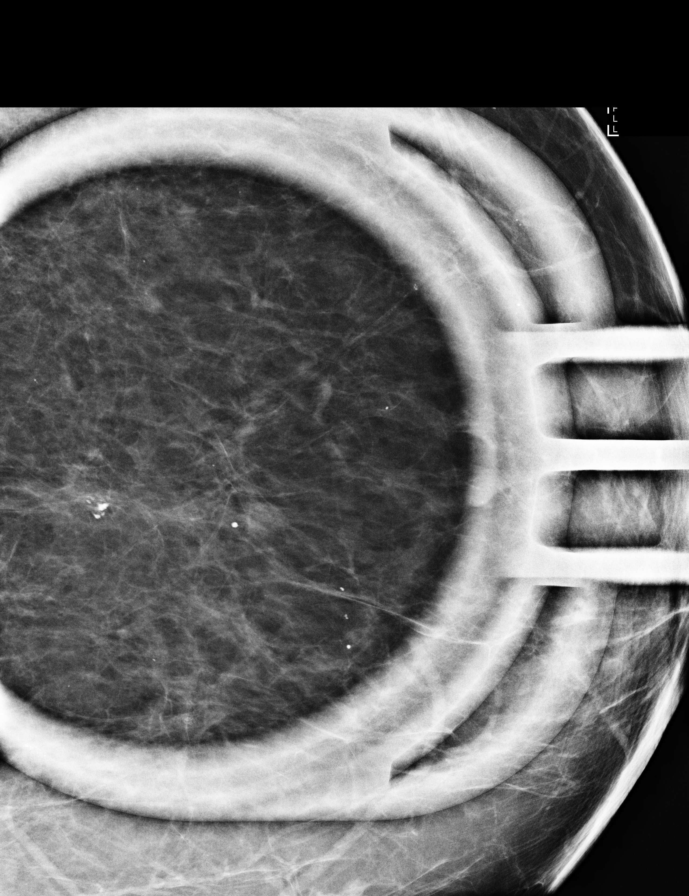

[L ML (2 of 4)]
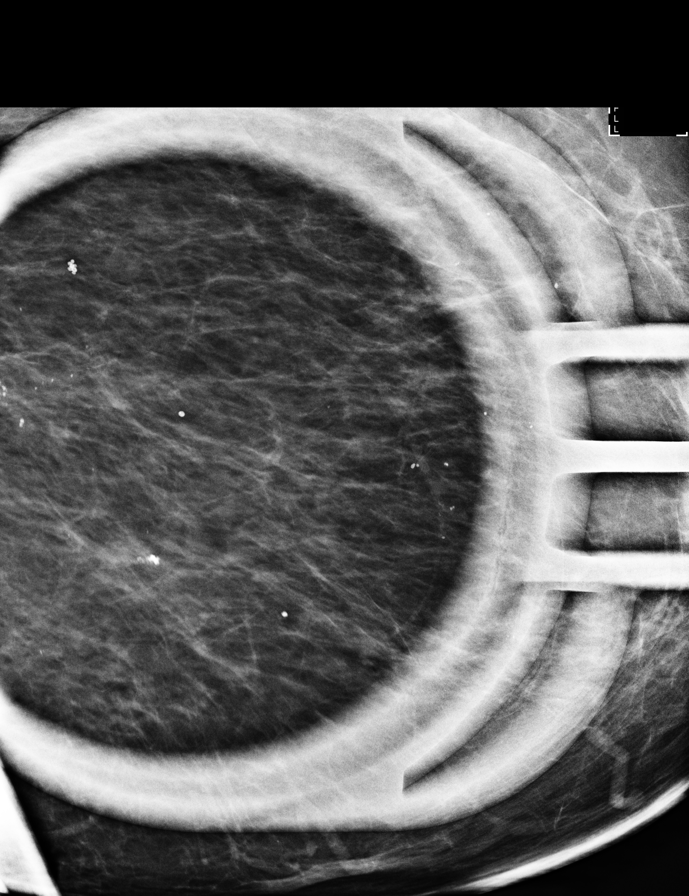

[L ML (3 of 4)]
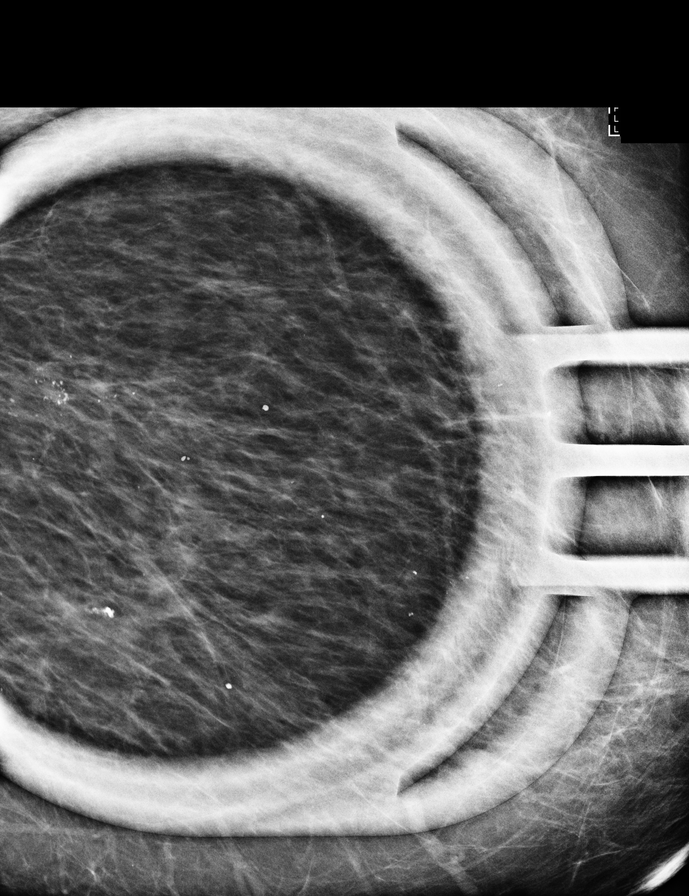

[L ML (4 of 4)]
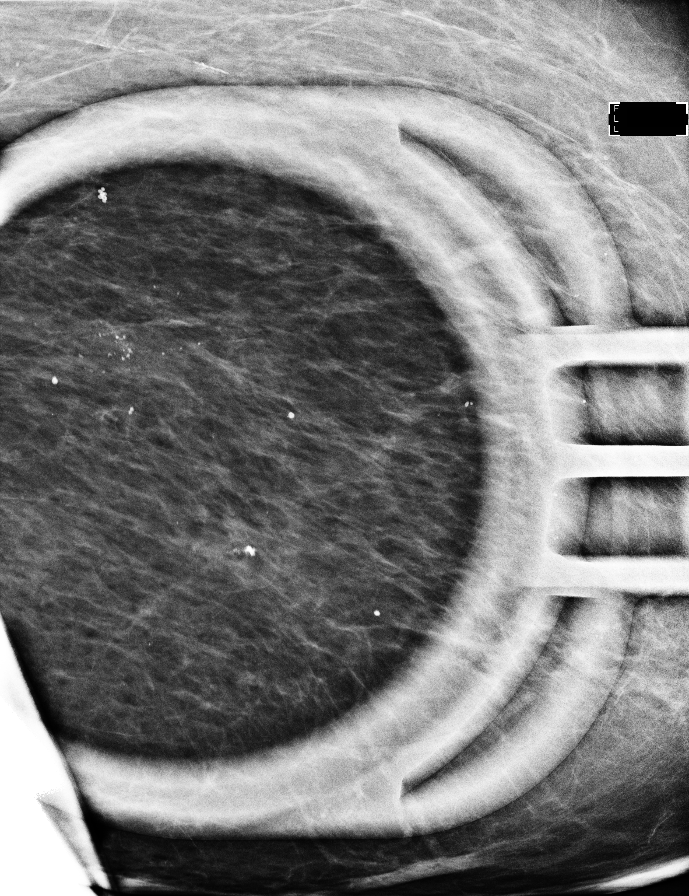

[L ML synth-2D]
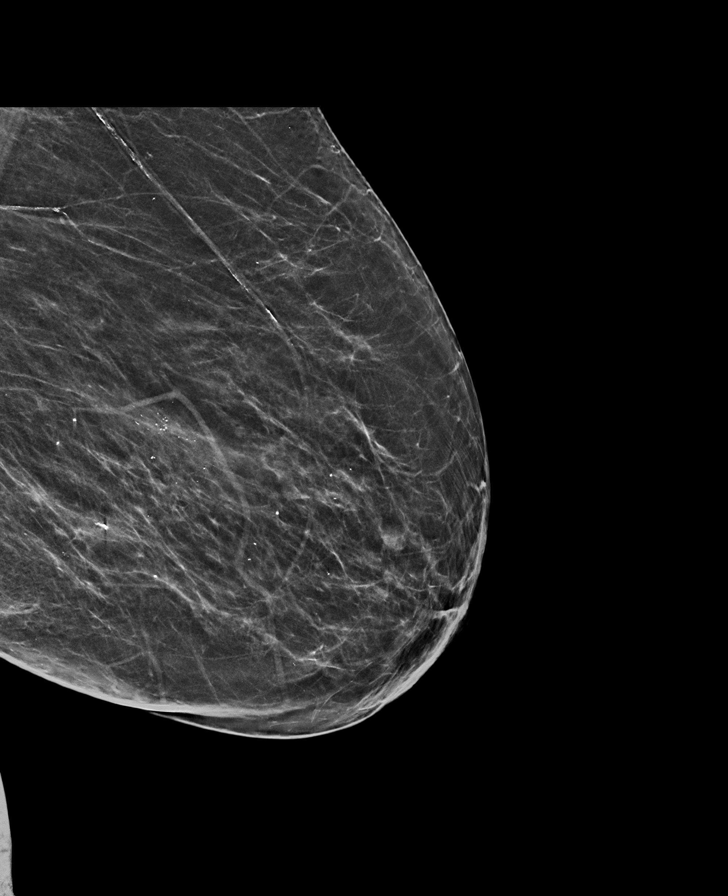

[8 of 13 positions shown; findings below may reference images not displayed]

ACR Breast Density Category b: There are scattered areas of
fibroglandular density.
FINDINGS: Full field and magnification views of the LEFT breast demonstrate
the following:

A 2.5 cm group of heterogeneous calcifications within the
UPPER-OUTER LEFT breast, middle to posterior depth.

A 0.3 cm group of coarse calcifications within the posterior central
LEFT breast, at the site of prior lumpectomy.
IMPRESSION: 1. Indeterminate 2.5 cm group of UPPER-OUTER LEFT breast
calcifications. Tissue sampling is recommended.
2. 0.3 cm group of calcifications within the posterior central LEFT
breast, at the site of prior lumpectomy. Although these most likely
represent dystrophic/fat necrosis changes, given other LEFT breast
calcifications and prior invasive breast cancer carcinoma at this
site tissue sampling is recommended.

RECOMMENDATION:
1. 3D/stereotactic guided biopsy of 2.5 cm group of UPPER-OUTER LEFT
breast calcifications and 3D/stereotactic guided biopsy of 0.3 cm
group of posterior central LEFT breast calcifications.

I have discussed the findings and recommendations with the patient.
If applicable, a reminder letter will be sent to the patient
regarding the next appointment.

BI-RADS CATEGORY  4: Suspicious.

## 2022-06-19 DIAGNOSIS — H401211 Low-tension glaucoma, right eye, mild stage: Secondary | ICD-10-CM | POA: Diagnosis not present

## 2022-06-27 DIAGNOSIS — C50312 Malignant neoplasm of lower-inner quadrant of left female breast: Secondary | ICD-10-CM | POA: Diagnosis not present

## 2022-06-27 DIAGNOSIS — Z923 Personal history of irradiation: Secondary | ICD-10-CM | POA: Diagnosis not present

## 2022-06-27 DIAGNOSIS — Z170 Estrogen receptor positive status [ER+]: Secondary | ICD-10-CM | POA: Diagnosis not present

## 2022-06-27 DIAGNOSIS — Z79899 Other long term (current) drug therapy: Secondary | ICD-10-CM | POA: Diagnosis not present

## 2022-06-27 DIAGNOSIS — Z5181 Encounter for therapeutic drug level monitoring: Secondary | ICD-10-CM | POA: Diagnosis not present

## 2022-06-27 DIAGNOSIS — Z90710 Acquired absence of both cervix and uterus: Secondary | ICD-10-CM | POA: Diagnosis not present

## 2022-06-27 DIAGNOSIS — Z9012 Acquired absence of left breast and nipple: Secondary | ICD-10-CM | POA: Diagnosis not present

## 2022-07-24 ENCOUNTER — Encounter: Payer: Self-pay | Admitting: *Deleted

## 2022-07-24 ENCOUNTER — Encounter: Payer: Self-pay | Admitting: Cardiology

## 2022-07-24 ENCOUNTER — Ambulatory Visit: Payer: Medicare HMO | Attending: Cardiology | Admitting: Cardiology

## 2022-07-24 VITALS — BP 134/60 | HR 64 | Ht 63.0 in | Wt 208.2 lb

## 2022-07-24 DIAGNOSIS — I10 Essential (primary) hypertension: Secondary | ICD-10-CM

## 2022-07-24 DIAGNOSIS — E782 Mixed hyperlipidemia: Secondary | ICD-10-CM

## 2022-07-24 DIAGNOSIS — I2510 Atherosclerotic heart disease of native coronary artery without angina pectoris: Secondary | ICD-10-CM

## 2022-07-24 NOTE — Patient Instructions (Addendum)
Medication Instructions:  Continue all current medications.   Labwork: none  Testing/Procedures: none  Follow-Up: 6 months   Any Other Special Instructions Will Be Listed Below (If Applicable).   If you need a refill on your cardiac medications before your next appointment, please call your pharmacy.  

## 2022-07-24 NOTE — Progress Notes (Signed)
Clinical Summary [name] is a 76 y.o.female seen today for follow up of the following medical problems.      1. CAD   - CABG Jan 2013: 3 vessel (LIMA to LAD, SVG to OM, SVG to acute marginal).She had prior stenting as described below   - Jan 2013 echo LVEF 55-60%       - no chest pains, no SOB/DOE - compliant with meds       2. Hyperlipidemia   -upcoming labs with pcp - she is on atorvastain  Jan 2023 TC 127 TG 146 HDL 33 LDL 68     3. HTN   - home bp's 130s/60s - compliant with meds     4. PAD - followed by vascular.    5. 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) 2008   pt has 2 stents   Nocturia    Obesity    Unspecified essential hypertension    takes Metoprolol,Lisinopril,and Amlodipine   Urinary frequency      No Known Allergies   Current Outpatient Medications  Medication Sig Dispense Refill   acetaminophen (TYLENOL) 500 MG tablet Take 500 mg by mouth 2 (two) times daily as needed for moderate pain or headache.      allopurinol (ZYLOPRIM) 100 MG tablet Take 100 mg by mouth at bedtime.     amLODipine (NORVASC) 10 MG tablet TAKE 1 TABLET EVERY DAY   *DOSE  INCREASE* 90 tablet 3   anastrozole (ARIMIDEX) 1 MG tablet TAKE 1 TABLET BY MOUTH EVERY DAY 90 tablet 2   Ascorbic Acid (VITAMIN C) 1000 MG tablet Take 1,000 mg by mouth daily.     aspirin 81 MG EC tablet Take 81 mg by mouth at bedtime.     atorvastatin (LIPITOR) 40 MG tablet Take 1 tablet by mouth daily.     Cholecalciferol (VITAMIN D) 50 MCG (2000 UT) tablet Take 2,000 Units by mouth daily.     colchicine 0.6 MG tablet Take one tablet PO and may repeat in one hour if no improvement. Then take one tablet every 3 hours until pain is relieved. 30 tablet 0   fluticasone (FLONASE) 50 MCG/ACT nasal spray Place 1 spray into both nostrils as needed for allergies or rhinitis.     hydrALAZINE (APRESOLINE) 25 MG tablet Take 25 mg by mouth See admin instructions. Take 25 mg daily may take a second 25 mg dose as needed for high blood pressure     lisinopril (PRINIVIL,ZESTRIL) 40 MG tablet  Take 1 tablet (40 mg total) by mouth daily. 90 tablet 3   LUMIGAN 0.01 % SOLN Place 1 drop into the right eye at bedtime.     Menthol, Topical Analgesic, (ICY HOT EX) Apply 1 Application topically daily as needed (pain).     metFORMIN (GLUCOPHAGE) 500 MG tablet Take 500 mg by mouth 2 (two) times daily with a meal.      metoprolol tartrate (LOPRESSOR) 50 MG tablet Take 1 tablet (50 mg total) by mouth 2 (two) times daily. 180 tablet 3   nitroGLYCERIN (NITROSTAT) 0.4 MG SL tablet Place 0.4 mg under the tongue every 5 (five) minutes as needed for chest pain.     vitamin B-12 (CYANOCOBALAMIN) 500 MCG tablet Take 500 mcg by mouth daily.     No current facility-administered medications for this visit.     Past Surgical History:  Procedure Laterality Date   ABDOMINAL HYSTERECTOMY     BACK SURGERY  2009/2010/2011   BREAST BIOPSY  unknown   left   BREAST BIOPSY Left 02/05/2022   Times 2   BREAST LUMPECTOMY WITH RADIOACTIVE SEED LOCALIZATION Left 03/03/2019   Procedure: LEFT BREAST LUMPECTOMY WITH RADIOACTIVE SEED  LOCALIZATION;  Surgeon: Rolm Bookbinder, MD;  Location: [place];  Service: General;  Laterality: Left;   BREAST LUMPECTOMY WITH RADIOACTIVE SEED LOCALIZATION Left 03/14/2022   Procedure: LEFT BREAST SEED GUIDED LUMPECTOMY;  Surgeon: Rolm Bookbinder, MD;  Location: [place];  Service: General;  Laterality: Left;   CARDIAC CATHETERIZATION  08/27/2006   NON-STEMI/TAXUS STENTING 100% PROXIMAL LAD (X2) JANUARY 2008   CARDIAC CATHETERIZATION  08/27/2010   occluded LAD stents, Om1 : 70%, RCA: 80%, Ef 40-45%   CARPAL TUNNEL RELEASE     bilateral   CATARACT EXTRACTION W/PHACO Right 10/11/2017   Procedure: CATARACT EXTRACTION PHACO AND INTRAOCULAR LENS PLACEMENT RIGHT EYE;  Surgeon: Baruch Goldmann, MD;  Location: [facility];  Service: Ophthalmology;  Laterality: Right;  CDE: 5.17   CATARACT EXTRACTION W/PHACO Left 12/27/2017   Procedure: CATARACT EXTRACTION PHACO AND INTRAOCULAR LENS PLACEMENT (IOC);  Surgeon: Baruch Goldmann, MD;  Location: [facility];  Service: Ophthalmology;  Laterality: Left;  CDE: 2.99   CHOLECYSTECTOMY  not sure   COLONOSCOPY     CORONARY ARTERY BYPASS GRAFT  09/05/2011   Procedure: CORONARY ARTERY BYPASS GRAFTING (CABG);  Surgeon: Grace Isaac, MD;  Location: [facility];  Service: Open Heart Surgery;  Laterality: N/A;  CABG times three using left internal mammary artery and left leg greater saphenous vein harvested endoscopically   LEG TENDON SURGERY  unknown   left    LUMBAR LAMINECTOMY/DECOMPRESSION MICRODISCECTOMY Left 06/04/2014   Procedure: LUMBAR LAMINECTOMY/DECOMPRESSION MICRODISCECTOMY LUMBAR FOUR-FIVE;  Surgeon: Elaina Hoops, MD;  Location: [place] [facility];  Service: Neurosurgery;  Laterality: Left;   MASS EXCISION Left 08/08/2018   Procedure: EXCISION 5CM LIPOMA ON BACK;  Surgeon: Virl Cagey, MD;  Location: [facility];  Service: General;  Laterality: Left;   TONSILLECTOMY       No Known Allergies    Family History  Problem Relation Age of Onset    Hypertension Father    Heart attack Sister 16   Heart attack Brother 68   Dementia Mother    Lupus Brother    Heart attack Brother    Hypertension Brother    Anesthesia problems Neg Hx    Hypotension Neg Hx    Malignant hyperthermia Neg Hx    Pseudochol deficiency Neg Hx  Social History Ms. Dippolito reports that she has never smoked. She has never used smokeless tobacco. Ms. Kite reports no history of alcohol use.   Review of Systems CONSTITUTIONAL: No weight loss, fever, chills, weakness or fatigue.  HEENT: Eyes: No visual loss, blurred vision, double vision or yellow sclerae.No hearing loss, sneezing, congestion, runny nose or sore throat.  SKIN: No rash or itching.  CARDIOVASCULAR: per hpi RESPIRATORY: No shortness of breath, cough or sputum.  GASTROINTESTINAL: No anorexia, nausea, vomiting or diarrhea. No abdominal pain or blood.  GENITOURINARY: No burning on urination, no polyuria NEUROLOGICAL: No headache, dizziness, syncope, paralysis, ataxia, numbness or tingling in the extremities. No change in bowel or bladder control.  MUSCULOSKELETAL: No muscle, back pain, joint pain or stiffness.  LYMPHATICS: No enlarged nodes. No history of splenectomy.  PSYCHIATRIC: No history of depression or anxiety.  ENDOCRINOLOGIC: No reports of sweating, cold or heat intolerance. No polyuria or polydipsia.  [name]   Physical Examination Today's Vitals   07/24/22 1435  BP: (!) 140/60  Pulse: 64  SpO2: 99%  Weight: 208 lb 3.2 oz (94.4 kg)  Height: '5\' 3"'$  (1.6 m)   Body mass index is 36.88 kg/m.  Gen: resting comfortably, no acute distress HEENT: no scleral icterus, pupils equal round and reactive, no palptable cervical adenopathy,  CV: RRR, no m/rg, no jvd Resp: Clear to auscultation bilaterally GI: abdomen is soft, non-tender, non-distended, normal bowel sounds, no hepatosplenomegaly MSK: extremities are warm, no edema.  Skin: warm, no rash Neuro:  no focal deficits Psych:  appropriate affect    Assessment and Plan  1. CAD   - no symptoms, continue current meds   2. HTN   Essentially at goal, here, home numbers at goal. COntinue current meds   3. Hyperlipidemia   - LDL is at goal, continue atorvatatin  F/u 6 months      Arnoldo Lenis, M.D.

## 2022-07-26 DIAGNOSIS — I129 Hypertensive chronic kidney disease with stage 1 through stage 4 chronic kidney disease, or unspecified chronic kidney disease: Secondary | ICD-10-CM | POA: Diagnosis not present

## 2022-07-26 DIAGNOSIS — Z6839 Body mass index (BMI) 39.0-39.9, adult: Secondary | ICD-10-CM | POA: Diagnosis not present

## 2022-07-26 DIAGNOSIS — N189 Chronic kidney disease, unspecified: Secondary | ICD-10-CM | POA: Diagnosis not present

## 2022-07-26 DIAGNOSIS — E1122 Type 2 diabetes mellitus with diabetic chronic kidney disease: Secondary | ICD-10-CM | POA: Diagnosis not present

## 2022-08-06 ENCOUNTER — Inpatient Hospital Stay: Payer: Medicare HMO | Attending: Hematology

## 2022-08-06 DIAGNOSIS — C50312 Malignant neoplasm of lower-inner quadrant of left female breast: Secondary | ICD-10-CM | POA: Diagnosis not present

## 2022-08-06 DIAGNOSIS — Z79811 Long term (current) use of aromatase inhibitors: Secondary | ICD-10-CM | POA: Diagnosis not present

## 2022-08-06 DIAGNOSIS — Z8639 Personal history of other endocrine, nutritional and metabolic disease: Secondary | ICD-10-CM | POA: Insufficient documentation

## 2022-08-06 DIAGNOSIS — N189 Chronic kidney disease, unspecified: Secondary | ICD-10-CM | POA: Diagnosis not present

## 2022-08-06 DIAGNOSIS — Z170 Estrogen receptor positive status [ER+]: Secondary | ICD-10-CM | POA: Insufficient documentation

## 2022-08-06 DIAGNOSIS — E559 Vitamin D deficiency, unspecified: Secondary | ICD-10-CM

## 2022-08-06 LAB — CBC WITH DIFFERENTIAL/PLATELET
Abs Immature Granulocytes: 0.05 10*3/uL (ref 0.00–0.07)
Basophils Absolute: 0 10*3/uL (ref 0.0–0.1)
Basophils Relative: 0 %
Eosinophils Absolute: 0.1 10*3/uL (ref 0.0–0.5)
Eosinophils Relative: 2 %
HCT: 37.5 % (ref 36.0–46.0)
Hemoglobin: 12.1 g/dL (ref 12.0–15.0)
Immature Granulocytes: 1 %
Lymphocytes Relative: 30 %
Lymphs Abs: 2.1 10*3/uL (ref 0.7–4.0)
MCH: 29.9 pg (ref 26.0–34.0)
MCHC: 32.3 g/dL (ref 30.0–36.0)
MCV: 92.6 fL (ref 80.0–100.0)
Monocytes Absolute: 0.6 10*3/uL (ref 0.1–1.0)
Monocytes Relative: 8 %
Neutro Abs: 4.1 10*3/uL (ref 1.7–7.7)
Neutrophils Relative %: 59 %
Platelets: 262 10*3/uL (ref 150–400)
RBC: 4.05 MIL/uL (ref 3.87–5.11)
RDW: 15 % (ref 11.5–15.5)
WBC: 6.9 10*3/uL (ref 4.0–10.5)
nRBC: 0 % (ref 0.0–0.2)

## 2022-08-06 LAB — COMPREHENSIVE METABOLIC PANEL
ALT: 22 U/L (ref 0–44)
AST: 22 U/L (ref 15–41)
Albumin: 4.5 g/dL (ref 3.5–5.0)
Alkaline Phosphatase: 51 U/L (ref 38–126)
Anion gap: 11 (ref 5–15)
BUN: 13 mg/dL (ref 8–23)
CO2: 25 mmol/L (ref 22–32)
Calcium: 9.5 mg/dL (ref 8.9–10.3)
Chloride: 105 mmol/L (ref 98–111)
Creatinine, Ser: 1.1 mg/dL — ABNORMAL HIGH (ref 0.44–1.00)
GFR, Estimated: 52 mL/min — ABNORMAL LOW (ref >60)
Glucose, Bld: 137 mg/dL — ABNORMAL HIGH (ref 70–99)
Potassium: 3.8 mmol/L (ref 3.5–5.1)
Sodium: 141 mmol/L (ref 135–145)
Total Bilirubin: 1.1 mg/dL (ref 0.3–1.2)
Total Protein: 7.9 g/dL (ref 6.5–8.1)

## 2022-08-06 LAB — VITAMIN D 25 HYDROXY (VIT D DEFICIENCY, FRACTURES): Vit D, 25-Hydroxy: 46.29 ng/mL (ref 30–100)

## 2022-08-08 DIAGNOSIS — N189 Chronic kidney disease, unspecified: Secondary | ICD-10-CM | POA: Diagnosis not present

## 2022-08-08 DIAGNOSIS — Z6837 Body mass index (BMI) 37.0-37.9, adult: Secondary | ICD-10-CM | POA: Diagnosis not present

## 2022-08-08 DIAGNOSIS — E1122 Type 2 diabetes mellitus with diabetic chronic kidney disease: Secondary | ICD-10-CM | POA: Diagnosis not present

## 2022-08-08 DIAGNOSIS — I129 Hypertensive chronic kidney disease with stage 1 through stage 4 chronic kidney disease, or unspecified chronic kidney disease: Secondary | ICD-10-CM | POA: Diagnosis not present

## 2022-08-13 ENCOUNTER — Other Ambulatory Visit (HOSPITAL_COMMUNITY): Payer: Self-pay | Admitting: Hematology

## 2022-08-13 ENCOUNTER — Inpatient Hospital Stay: Payer: Medicare HMO | Admitting: Hematology

## 2022-08-13 VITALS — BP 161/72 | HR 60 | Temp 98.3°F | Resp 18 | Ht 62.5 in | Wt 205.3 lb

## 2022-08-13 DIAGNOSIS — Z79811 Long term (current) use of aromatase inhibitors: Secondary | ICD-10-CM | POA: Diagnosis not present

## 2022-08-13 DIAGNOSIS — Z170 Estrogen receptor positive status [ER+]: Secondary | ICD-10-CM | POA: Diagnosis not present

## 2022-08-13 DIAGNOSIS — Z9889 Other specified postprocedural states: Secondary | ICD-10-CM

## 2022-08-13 DIAGNOSIS — C50312 Malignant neoplasm of lower-inner quadrant of left female breast: Secondary | ICD-10-CM

## 2022-08-13 DIAGNOSIS — N189 Chronic kidney disease, unspecified: Secondary | ICD-10-CM | POA: Diagnosis not present

## 2022-08-13 DIAGNOSIS — Z8639 Personal history of other endocrine, nutritional and metabolic disease: Secondary | ICD-10-CM | POA: Diagnosis not present

## 2022-08-13 NOTE — Patient Instructions (Signed)
[place] at [facility] Discharge Instructions   You were seen and examined today by Dr. Delton Coombes.  He reviewed the results of your lab work which are normal.   Continue anastrozole daily as prescribed.   Continue Vitamin D.   We will get a mammogram prior to your next visit.   Return as scheduled.    Thank you for choosing [place] at [facility] to provide your oncology and hematology care.  To afford each patient quality time with our provider, please arrive at least 15 minutes before your scheduled appointment time.   If you have a lab appointment with the [facility] please come in thru the Main Entrance and check in at the main information desk.  You need to re-schedule your appointment should you arrive 10 or more minutes late.  We strive to give you quality time with our providers, and arriving late affects you and other patients whose appointments are after yours.  Also, if you no show three or more times for appointments you may be dismissed from the clinic at the providers discretion.     Again, thank you for choosing [facility].  Our hope is that these requests will decrease the amount of time that you wait before being seen by our physicians.       _____________________________________________________________  Should you have questions after your visit to [facility], please contact our office at [phone] and follow the prompts.  Our office hours are 8:00 a.m. and 4:30 p.m. Monday - Friday.  Please note that voicemails left after 4:00 p.m. may not be returned until the following business day.  We are closed weekends and major holidays.  You do have access to a nurse 24-7, just call the main number to the clinic [phone] and do not press any options, hold on the line and a nurse will answer the phone.    For prescription refill requests, have your pharmacy contact our office and allow 72  hours.    Due to Covid, you will need to wear a mask upon entering the hospital. If you do not have a mask, a mask will be given to you at the Main Entrance upon arrival. For doctor visits, patients may have 1 support person age 21 or older with them. For treatment visits, patients can not have anyone with them due to social distancing guidelines and our immunocompromised population.

## 2022-08-13 NOTE — Progress Notes (Signed)
[facility] [address] [name]   Patient Care Team: [name], MD as PCP - General Branch, Alphonse Guild, MD as PCP - Cardiology (Cardiology) Kary Kos, MD (Neurosurgery) Grace Isaac, MD (Inactive) (Cardiothoracic Surgery)  SUMMARY OF ONCOLOGIC HISTORY: Oncology History  Malignant neoplasm of lower-inner quadrant of left breast in female, estrogen receptor positive ([place])  03/03/2019 Cancer Staging   Staging form: Breast, AJCC 8th Edition - Pathologic stage from 03/03/2019: Stage IA (pT1b, pN0, cM0, G1, ER+, PR-, HER2-) - Signed by Gardenia Phlegm, NP on 03/11/2019   03/11/2019 Initial Diagnosis   Malignant neoplasm of lower-inner quadrant of left breast in female, estrogen receptor positive ([place])     CHIEF COMPLIANT: Follow-up of left breast cancer   INTERVAL HISTORY: [name] is a 76 y.o. female seen for follow-up of her left breast cancer.  She underwent lumpectomy and radiation therapy since last visit.  She is continuing to tolerate anastrozole very well.  REVIEW OF SYSTEMS:   Review of Systems  Constitutional:  Negative for appetite change and fatigue.  Musculoskeletal:  Positive for back pain (5/10).  All other systems reviewed and are negative.   I have reviewed the past medical history, past surgical history, social history and family history with the patient and they are unchanged from previous note.   ALLERGIES:   has No Known Allergies.   MEDICATIONS:  Current Outpatient Medications  Medication Sig Dispense Refill   acetaminophen (TYLENOL) 500 MG tablet Take 500 mg by mouth 2 (two) times daily as needed for moderate pain or headache.      allopurinol (ZYLOPRIM) 100 MG tablet Take 100 mg by mouth at bedtime.     amLODipine (NORVASC) 10 MG tablet TAKE 1 TABLET EVERY DAY  *DOSE  INCREASE* 90 tablet 3   anastrozole (ARIMIDEX) 1 MG tablet TAKE 1 TABLET BY MOUTH EVERY DAY 90 tablet 2   Ascorbic Acid  (VITAMIN C) 1000 MG tablet Take 1,000 mg by mouth daily.     aspirin 81 MG EC tablet Take 81 mg by mouth at bedtime.     atorvastatin (LIPITOR) 40 MG tablet Take 1 tablet by mouth daily.     Cholecalciferol (VITAMIN D) 50 MCG (2000 UT) tablet Take 2,000 Units by mouth daily.     colchicine 0.6 MG tablet Take one tablet PO and may repeat in one hour if no improvement. Then take one tablet every 3 hours until pain is relieved. 30 tablet 0   COMIRNATY SUSP injection      fluticasone (FLONASE) 50 MCG/ACT nasal spray Place 1 spray into both nostrils as needed for allergies or rhinitis.     lisinopril (PRINIVIL,ZESTRIL) 40 MG tablet Take 1 tablet (40 mg total) by mouth daily. 90 tablet 3   LUMIGAN 0.01 % SOLN Place 1 drop into the right eye at bedtime.     Menthol, Topical Analgesic, (ICY HOT EX) Apply 1 Application topically daily as needed (pain).     metFORMIN (GLUCOPHAGE) 500 MG tablet Take 500 mg by mouth 2 (two) times daily with a meal.      metoprolol tartrate (LOPRESSOR) 50 MG tablet Take 1 tablet (50 mg total) by mouth 2 (two) times daily. 180 tablet 3   nitroGLYCERIN (NITROSTAT) 0.4 MG SL tablet Place 0.4 mg under the tongue every 5 (five) minutes as needed for chest pain.     tiZANidine (ZANAFLEX) 4 MG tablet Take by mouth as needed.  vitamin B-12 (CYANOCOBALAMIN) 500 MCG tablet Take 500 mcg by mouth daily.     hydrALAZINE (APRESOLINE) 25 MG tablet Take 25 mg by mouth See admin instructions. Take 25 mg daily may take a second 25 mg dose as needed for high blood pressure     No current facility-administered medications for this visit.     PHYSICAL EXAMINATION: Performance status (ECOG): 1 - Symptomatic but completely ambulatory  Vitals:   08/13/22 1134  BP: (!) 161/72  Pulse: 60  Resp: 18  Temp: 98.3 F (36.8 C)  SpO2: 100%   Wt Readings from Last 3 Encounters:  08/13/22 205 lb 4.8 oz (93.1 kg)  07/24/22 208 lb 3.2 oz (94.4 kg)  03/14/22 215 lb (97.5 kg)   Physical  Exam Vitals reviewed.  Constitutional:      Appearance: Normal appearance. She is obese.  Cardiovascular:     Rate and Rhythm: Normal rate and regular rhythm.     Pulses: Normal pulses.     Heart sounds: Normal heart sounds.  Pulmonary:     Effort: Pulmonary effort is normal.     Breath sounds: Normal breath sounds.  Chest:  Breasts:    Left: No swelling, bleeding, inverted nipple, mass, nipple discharge, skin change or tenderness.  Lymphadenopathy:     Upper Body:     Left upper body: No supraclavicular or axillary adenopathy.  Neurological:     General: No focal deficit present.     Mental Status: She is alert and oriented to person, place, and time.  Psychiatric:        Mood and Affect: Mood normal.        Behavior: Behavior normal.     Breast Exam Chaperone: Thana Ates     LABORATORY DATA:  I have reviewed the data as listed    Latest Ref Rng & Units 08/06/2022   11:50 AM 03/07/2022    4:00 PM 02/06/2022   10:30 AM  CMP  Glucose 70 - 99 mg/dL 137  99  153   BUN 8 - 23 mg/dL _0 Creatinine 0.44 - 1.00 mg/dL 1.10  1.29  1.18   Sodium 135 - 145 mmol/L 141  139  140   Potassium 3.5 - 5.1 mmol/L 3.8  3.5  3.9   Chloride 98 - 111 mmol/L 105  108  109   CO2 22 - 32 mmol/L _1 Calcium 8.9 - 10.3 mg/dL 9.5  9.3  9.1   Total Protein 6.5 - 8.1 g/dL 7.9   7.5   Total Bilirubin 0.3 - 1.2 mg/dL 1.1   0.9   Alkaline Phos 38 - 126 U/L 51   46   AST 15 - 41 U/L 22   22   ALT 0 - 44 U/L 22   21    No results found for: "CAN153" Lab Results  Component Value Date   WBC 6.9 08/06/2022   HGB 12.1 08/06/2022   HCT 37.5 08/06/2022   MCV 92.6 08/06/2022   PLT 262 08/06/2022   NEUTROABS 4.1 08/06/2022    ASSESSMENT:  1.  Stage Ia (PT1BPNX) left breast IDC: - Abnormal mammogram followed by left breast biopsy on 11/04/2018 showed small intraductal papilloma with usual ductal hyperplasia, no evidence of malignancy. - Left lumpectomy by Dr. Donne Hazel on  03/03/2019, pathology showing 0.8 cm grade 1 invasive ductal carcinoma, arising in a complex sclerosing lesion, free margins.  ER-100%, PR-0%, HER-2 negative.  Ki-67 5%. - She was referred to [facility].  No radiation therapy was recommended. -Anastrozole was started on 04/02/2019. 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  Negative for invasive carcinoma.  [place] present.  ER 40% moderate staining, PR more than 1% weak staining.  Second left breast biopsy was benign breast tissue. - Left breast lumpectomy (03/14/2022): Negative for invasive carcinoma.  Focal residual intermediate grade DCIS measuring 2.4 mm.  Focal ALH with ductal involvement.  Reexcision of the medial margin was negative.  Additional superior margin is also negative.  pTis PNX. - XRT completed in [place].   2.  Bone health: - DEXA scan on 03/17/2019 shows T score of -0.2 which is in the normal range.     PLAN:  1.  Stage Ia left breast IDC: - I have reviewed the pathology reports from lumpectomy on 03/14/2022. - She has completed radiation therapy. - She is tolerating anastrozole well. - Reviewed labs today which showed normal LFTs and stable creatinine of 1.1.  CBC was normal. - Continue anastrozole.  RTC after mammogram in May 2024.   2.  Bone health: - Vitamin D level is 46.  Continue supplements. - DEXA scan on 05/08/2021 with T-score -0.2, normal.   3.  CKD: - Mild CKD with creatinine 1.1 stable.  Breast Cancer therapy associated bone loss: I have recommended calcium, Vitamin D and weight bearing exercises.  Orders placed this encounter:  Orders Placed This Encounter  Procedures   MM DIAG BREAST TOMO BILATERAL     The patient has a good understanding of the overall plan. She agrees with it. She will call with any problems that may develop before the next visit here.  Derek Jack, MD [place] [phone]

## 2022-08-30 DIAGNOSIS — E1165 Type 2 diabetes mellitus with hyperglycemia: Secondary | ICD-10-CM | POA: Diagnosis not present

## 2022-08-30 DIAGNOSIS — E1122 Type 2 diabetes mellitus with diabetic chronic kidney disease: Secondary | ICD-10-CM | POA: Diagnosis not present

## 2022-08-30 DIAGNOSIS — N183 Chronic kidney disease, stage 3 unspecified: Secondary | ICD-10-CM | POA: Diagnosis not present

## 2022-08-30 DIAGNOSIS — I10 Essential (primary) hypertension: Secondary | ICD-10-CM | POA: Diagnosis not present

## 2022-09-05 DIAGNOSIS — Z6836 Body mass index (BMI) 36.0-36.9, adult: Secondary | ICD-10-CM | POA: Diagnosis not present

## 2022-09-05 DIAGNOSIS — Z955 Presence of coronary angioplasty implant and graft: Secondary | ICD-10-CM | POA: Diagnosis not present

## 2022-09-05 DIAGNOSIS — R030 Elevated blood-pressure reading, without diagnosis of hypertension: Secondary | ICD-10-CM | POA: Diagnosis not present

## 2022-09-05 DIAGNOSIS — D0512 Intraductal carcinoma in situ of left breast: Secondary | ICD-10-CM | POA: Diagnosis not present

## 2022-09-05 DIAGNOSIS — E7849 Other hyperlipidemia: Secondary | ICD-10-CM | POA: Diagnosis not present

## 2022-09-05 DIAGNOSIS — N1831 Chronic kidney disease, stage 3a: Secondary | ICD-10-CM | POA: Diagnosis not present

## 2022-09-05 DIAGNOSIS — E1122 Type 2 diabetes mellitus with diabetic chronic kidney disease: Secondary | ICD-10-CM | POA: Diagnosis not present

## 2022-09-05 DIAGNOSIS — E1151 Type 2 diabetes mellitus with diabetic peripheral angiopathy without gangrene: Secondary | ICD-10-CM | POA: Diagnosis not present

## 2022-09-05 DIAGNOSIS — I2510 Atherosclerotic heart disease of native coronary artery without angina pectoris: Secondary | ICD-10-CM | POA: Diagnosis not present

## 2022-09-18 DIAGNOSIS — H409 Unspecified glaucoma: Secondary | ICD-10-CM | POA: Diagnosis not present

## 2022-09-18 DIAGNOSIS — E1122 Type 2 diabetes mellitus with diabetic chronic kidney disease: Secondary | ICD-10-CM | POA: Diagnosis not present

## 2022-09-18 DIAGNOSIS — E1151 Type 2 diabetes mellitus with diabetic peripheral angiopathy without gangrene: Secondary | ICD-10-CM | POA: Diagnosis not present

## 2022-09-18 DIAGNOSIS — N1831 Chronic kidney disease, stage 3a: Secondary | ICD-10-CM | POA: Diagnosis not present

## 2022-09-18 DIAGNOSIS — I252 Old myocardial infarction: Secondary | ICD-10-CM | POA: Diagnosis not present

## 2022-09-18 DIAGNOSIS — M1990 Unspecified osteoarthritis, unspecified site: Secondary | ICD-10-CM | POA: Diagnosis not present

## 2022-09-18 DIAGNOSIS — Z008 Encounter for other general examination: Secondary | ICD-10-CM | POA: Diagnosis not present

## 2022-09-18 DIAGNOSIS — Z7984 Long term (current) use of oral hypoglycemic drugs: Secondary | ICD-10-CM | POA: Diagnosis not present

## 2022-09-18 DIAGNOSIS — Z8249 Family history of ischemic heart disease and other diseases of the circulatory system: Secondary | ICD-10-CM | POA: Diagnosis not present

## 2022-09-18 DIAGNOSIS — E785 Hyperlipidemia, unspecified: Secondary | ICD-10-CM | POA: Diagnosis not present

## 2022-09-18 DIAGNOSIS — I2510 Atherosclerotic heart disease of native coronary artery without angina pectoris: Secondary | ICD-10-CM | POA: Diagnosis not present

## 2022-09-18 DIAGNOSIS — J309 Allergic rhinitis, unspecified: Secondary | ICD-10-CM | POA: Diagnosis not present

## 2022-11-16 DIAGNOSIS — H401211 Low-tension glaucoma, right eye, mild stage: Secondary | ICD-10-CM | POA: Diagnosis not present

## 2022-11-16 DIAGNOSIS — H524 Presbyopia: Secondary | ICD-10-CM | POA: Diagnosis not present

## 2022-11-26 ENCOUNTER — Other Ambulatory Visit: Payer: Self-pay | Admitting: Hematology

## 2022-11-26 ENCOUNTER — Other Ambulatory Visit: Payer: Self-pay | Admitting: *Deleted

## 2022-11-26 DIAGNOSIS — Z170 Estrogen receptor positive status [ER+]: Secondary | ICD-10-CM

## 2022-11-26 NOTE — Telephone Encounter (Signed)
Anastrozole refill approved.  Patient tolerating and is to continue therapy. 

## 2022-11-27 DIAGNOSIS — E1122 Type 2 diabetes mellitus with diabetic chronic kidney disease: Secondary | ICD-10-CM | POA: Diagnosis not present

## 2022-11-27 DIAGNOSIS — I129 Hypertensive chronic kidney disease with stage 1 through stage 4 chronic kidney disease, or unspecified chronic kidney disease: Secondary | ICD-10-CM | POA: Diagnosis not present

## 2022-11-27 DIAGNOSIS — N189 Chronic kidney disease, unspecified: Secondary | ICD-10-CM | POA: Diagnosis not present

## 2022-12-11 DIAGNOSIS — E1129 Type 2 diabetes mellitus with other diabetic kidney complication: Secondary | ICD-10-CM | POA: Diagnosis not present

## 2022-12-11 DIAGNOSIS — R809 Proteinuria, unspecified: Secondary | ICD-10-CM | POA: Diagnosis not present

## 2022-12-11 DIAGNOSIS — E1122 Type 2 diabetes mellitus with diabetic chronic kidney disease: Secondary | ICD-10-CM | POA: Diagnosis not present

## 2022-12-11 DIAGNOSIS — N189 Chronic kidney disease, unspecified: Secondary | ICD-10-CM | POA: Diagnosis not present

## 2022-12-11 DIAGNOSIS — I129 Hypertensive chronic kidney disease with stage 1 through stage 4 chronic kidney disease, or unspecified chronic kidney disease: Secondary | ICD-10-CM | POA: Diagnosis not present

## 2022-12-24 ENCOUNTER — Other Ambulatory Visit: Payer: Self-pay

## 2022-12-24 DIAGNOSIS — Z170 Estrogen receptor positive status [ER+]: Secondary | ICD-10-CM

## 2022-12-25 ENCOUNTER — Ambulatory Visit (HOSPITAL_COMMUNITY)
Admission: RE | Admit: 2022-12-25 | Discharge: 2022-12-25 | Disposition: A | Discharge status: Home / Self Care | Payer: Medicare HMO | Source: Ambulatory Visit | Attending: Hematology | Admitting: Hematology

## 2022-12-25 ENCOUNTER — Encounter (HOSPITAL_COMMUNITY): Payer: Self-pay

## 2022-12-25 ENCOUNTER — Inpatient Hospital Stay: Payer: Medicare HMO | Attending: Hematology

## 2022-12-25 DIAGNOSIS — Z9889 Other specified postprocedural states: Secondary | ICD-10-CM

## 2022-12-25 DIAGNOSIS — C50312 Malignant neoplasm of lower-inner quadrant of left female breast: Secondary | ICD-10-CM

## 2022-12-25 DIAGNOSIS — Z79899 Other long term (current) drug therapy: Secondary | ICD-10-CM | POA: Diagnosis not present

## 2022-12-25 DIAGNOSIS — R92323 Mammographic fibroglandular density, bilateral breasts: Secondary | ICD-10-CM | POA: Diagnosis not present

## 2022-12-25 DIAGNOSIS — Z170 Estrogen receptor positive status [ER+]: Secondary | ICD-10-CM | POA: Diagnosis not present

## 2022-12-25 DIAGNOSIS — N183 Chronic kidney disease, stage 3 unspecified: Secondary | ICD-10-CM | POA: Diagnosis not present

## 2022-12-25 LAB — CBC WITH DIFFERENTIAL/PLATELET
Abs Immature Granulocytes: 0.05 10*3/uL (ref 0.00–0.07)
Basophils Absolute: 0 10*3/uL (ref 0.0–0.1)
Basophils Relative: 0 %
Eosinophils Absolute: 0.2 10*3/uL (ref 0.0–0.5)
Eosinophils Relative: 2 %
HCT: 38.3 % (ref 36.0–46.0)
Hemoglobin: 12.2 g/dL (ref 12.0–15.0)
Immature Granulocytes: 1 %
Lymphocytes Relative: 41 %
Lymphs Abs: 3.1 10*3/uL (ref 0.7–4.0)
MCH: 28.2 pg (ref 26.0–34.0)
MCHC: 31.9 g/dL (ref 30.0–36.0)
MCV: 88.5 fL (ref 80.0–100.0)
Monocytes Absolute: 0.5 10*3/uL (ref 0.1–1.0)
Monocytes Relative: 6 %
Neutro Abs: 3.8 10*3/uL (ref 1.7–7.7)
Neutrophils Relative %: 50 %
Platelets: 271 10*3/uL (ref 150–400)
RBC: 4.33 MIL/uL (ref 3.87–5.11)
RDW: 16.1 % — ABNORMAL HIGH (ref 11.5–15.5)
WBC: 7.6 10*3/uL (ref 4.0–10.5)
nRBC: 0 % (ref 0.0–0.2)

## 2022-12-25 LAB — COMPREHENSIVE METABOLIC PANEL
ALT: 19 U/L (ref 0–44)
AST: 23 U/L (ref 15–41)
Albumin: 4.4 g/dL (ref 3.5–5.0)
Alkaline Phosphatase: 47 U/L (ref 38–126)
Anion gap: 13 (ref 5–15)
BUN: 23 mg/dL (ref 8–23)
CO2: 22 mmol/L (ref 22–32)
Calcium: 9.4 mg/dL (ref 8.9–10.3)
Chloride: 103 mmol/L (ref 98–111)
Creatinine, Ser: 1.15 mg/dL — ABNORMAL HIGH (ref 0.44–1.00)
GFR, Estimated: 49 mL/min — ABNORMAL LOW (ref >60)
Glucose, Bld: 151 mg/dL — ABNORMAL HIGH (ref 70–99)
Potassium: 3.4 mmol/L — ABNORMAL LOW (ref 3.5–5.1)
Sodium: 138 mmol/L (ref 135–145)
Total Bilirubin: 1.1 mg/dL (ref 0.3–1.2)
Total Protein: 7.8 g/dL (ref 6.5–8.1)

## 2022-12-25 LAB — VITAMIN D 25 HYDROXY (VIT D DEFICIENCY, FRACTURES): Vit D, 25-Hydroxy: 40.37 ng/mL (ref 30–100)

## 2023-01-02 NOTE — Progress Notes (Signed)
[facility] [address], [place] [address]    Clinic Day:  01/03/2023  Referring physician: Juliette Alcide, MD  Patient Care Team: Juliette Alcide, MD as PCP - General Branch, Dorothe Pea, MD as PCP - Cardiology (Cardiology) Donalee Citrin, MD (Neurosurgery) Delight Ovens, MD (Inactive) (Cardiothoracic Surgery)   ASSESSMENT & PLAN:   Assessment: 1.  Stage Ia (PT1BPNX) left breast IDC: - Abnormal mammogram followed by left breast biopsy on 11/04/2018 showed small intraductal papilloma with usual ductal hyperplasia, no evidence of malignancy. - Left lumpectomy by Dr. Dwain Sarna on 03/03/2019, pathology showing 0.8 cm grade 1 invasive ductal carcinoma, arising in a complex sclerosing lesion, free margins.  ER-100%, PR-0%, HER-2 negative.  Ki-67 5%. - She was referred to Dr.Yanagihara.  No radiation therapy was recommended. -Anastrozole was started on 04/02/2019. 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  Negative for invasive carcinoma.  ALH present.  ER 40% moderate staining, PR more than 1% weak staining.  Second left breast biopsy was benign breast tissue. - Left breast lumpectomy (03/14/2022): Negative for invasive carcinoma.  Focal residual intermediate grade DCIS measuring 2.4 mm.  Focal ALH with ductal involvement.  Reexcision of the medial margin was negative.  Additional superior margin is also negative.  pTis PNX. - XRT completed in [place].   2.  Bone health: - DEXA scan on 03/17/2019 shows T score of -0.2 which is in the normal range.    Plan: 1.  Stage Ia left breast IDC: - She is tolerating anastrozole very well. - Reviewed mammogram from 12/25/2022: BI-RADS Category 2. - Labs: Normal LFTs and CBC. - Continue anastrozole daily.  RTC 6 months for follow-up.   2.  Bone health: - Vitamin D level is 40.  Continue vitamin D supplements. - Will plan to repeat DEXA scan in 6 months.  Last scan was in September 2022  with T-score -0.2.   3.  CKD: - Mild CKD with creatinine around 1.15 is stable.    Orders Placed This Encounter  Procedures   DG Bone Density    Standing Status:   Future    Standing Expiration Date:   01/03/2024    Order Specific Question:   Reason for Exam (SYMPTOM  OR DIAGNOSIS REQUIRED)    Answer:   anti-estrogen therapy    Order Specific Question:   Preferred imaging location?    Answer:   [facility]   CBC with Differential    Standing Status:   Future    Standing Expiration Date:   01/03/2024   Comprehensive metabolic panel    Standing Status:   Future    Standing Expiration Date:   01/03/2024   VITAMIN D 25 Hydroxy (Vit-D Deficiency, Fractures)    Standing Status:   Future    Standing Expiration Date:   01/03/2024      I,Katie Daubenspeck,acting as a scribe for Doreatha Massed, MD.,have documented all relevant documentation on the behalf of Doreatha Massed, MD,as directed by  Doreatha Massed, MD while in the presence of Doreatha Massed, MD.   I, Doreatha Massed MD, have reviewed the above documentation for accuracy and completeness, and I agree with the above.   Doreatha Massed, MD   5/9/202412:48 PM  CHIEF COMPLAINT:   Diagnosis: left breast cancer    Cancer Staging  Malignant neoplasm of lower-inner quadrant of left breast in female, estrogen receptor positive (HCC) Staging form: Breast, AJCC 8th Edition - Pathologic stage  from 03/03/2019: Stage IA (pT1b, pN0, cM0, G1, ER+, PR-, HER2-) - Signed by Loa Socks, NP on 03/11/2019    Prior Therapy: 1. Left lumpectomy on 03/03/2019 (Dr. Dwain Sarna) 2. Left lumpectomy on 03/14/22 3. XRT to left breast completed in [place]  Current Therapy:  anastrozole, started 04/02/2019   HISTORY OF PRESENT ILLNESS:   Oncology History  Malignant neoplasm of lower-inner quadrant of left breast in female, estrogen receptor positive (HCC)  03/03/2019 Cancer Staging   Staging form: Breast, AJCC 8th  Edition - Pathologic stage from 03/03/2019: Stage IA (pT1b, pN0, cM0, G1, ER+, PR-, HER2-) - Signed by Loa Socks, NP on 03/11/2019   03/11/2019 Initial Diagnosis   Malignant neoplasm of lower-inner quadrant of left breast in female, estrogen receptor positive (HCC)      INTERVAL HISTORY:   [name] is a 77 y.o. female presenting to clinic today for follow up of left breast cancer. She was last seen by me on 08/03/22.  Since her last visit, she underwent annual bilateral mammogram on 12/25/22 showing: new left breast lumpectomy changes; no evidence of malignancy in either breast.  Today, she states that she is doing well overall. Her appetite level is at 100%. Her energy level is at 50%.  PAST MEDICAL HISTORY:   Past 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; left breast DCIS 02/05/22   Carotid artery occlusion    Chronic kidney disease    mild, stage 1   DM (diabetes mellitus) (HCC)    takes Metformin 1000mg  BID   Dyslipidemia    takes Pravastatin daily   Early cataracts, bilateral    Gout    takes Allopurinol daily   Herniated disc    left   Lumbar herniated disc    Myocardial infarction [facility]) 2008   pt has 2 stents   Nocturia    Obesity    Unspecified essential hypertension    takes Metoprolol,Lisinopril,and Amlodipine   Urinary frequency     Surgical History: Past Surgical History:  Procedure Laterality Date   ABDOMINAL HYSTERECTOMY     BACK SURGERY  2009/2010/2011   BREAST BIOPSY  unknown   left   BREAST BIOPSY Left 02/05/2022   Times 2   BREAST LUMPECTOMY WITH  RADIOACTIVE SEED LOCALIZATION Left 03/03/2019   Procedure: LEFT BREAST LUMPECTOMY WITH RADIOACTIVE SEED LOCALIZATION;  Surgeon: Emelia Loron, MD;  Location: [facility];  Service: General;  Laterality: Left;   BREAST LUMPECTOMY WITH RADIOACTIVE SEED LOCALIZATION Left 03/14/2022   Procedure: LEFT BREAST SEED GUIDED LUMPECTOMY;  Surgeon: Emelia Loron, MD;  Location: [facility] OR;  Service: General;  Laterality: Left;   CARDIAC CATHETERIZATION  08/27/2006   NON-STEMI/TAXUS STENTING 100% PROXIMAL LAD (X2) JANUARY 2008   CARDIAC CATHETERIZATION  08/27/2010   occluded LAD stents, Om1 : 70%, RCA: 80%, Ef 40-45%   CARPAL TUNNEL RELEASE     bilateral   CATARACT EXTRACTION W/PHACO Right 10/11/2017   Procedure: CATARACT EXTRACTION PHACO AND INTRAOCULAR LENS PLACEMENT RIGHT EYE;  Surgeon: Fabio Pierce, MD;  Location:  [facility];  Service: Ophthalmology;  Laterality: Right;  CDE: 5.17   CATARACT EXTRACTION W/PHACO Left 12/27/2017   Procedure: CATARACT EXTRACTION PHACO AND INTRAOCULAR LENS PLACEMENT (IOC);  Surgeon: Fabio Pierce, MD;  Location: [facility];  Service: Ophthalmology;  Laterality: Left;  CDE: 2.99   CHOLECYSTECTOMY  not sure   COLONOSCOPY     CORONARY ARTERY BYPASS GRAFT  09/05/2011   Procedure: CORONARY ARTERY BYPASS GRAFTING (CABG);  Surgeon: Delight Ovens, MD;  Location: [facility] OR;  Service: Open Heart Surgery;  Laterality: N/A;  CABG times three using left internal mammary artery and left leg greater saphenous vein harvested endoscopically   LEG TENDON SURGERY  unknown   left    LUMBAR LAMINECTOMY/DECOMPRESSION MICRODISCECTOMY Left 06/04/2014   Procedure: LUMBAR LAMINECTOMY/DECOMPRESSION MICRODISCECTOMY LUMBAR FOUR-FIVE;  Surgeon: Mariam Dollar, MD;  Location: [facility];  Service: Neurosurgery;  Laterality: Left;   MASS EXCISION Left 08/08/2018   Procedure: EXCISION 5CM LIPOMA ON BACK;  Surgeon: Lucretia Roers, MD;  Location: [facility];  Service: General;  Laterality: Left;    TONSILLECTOMY      Social History: Social History   Socioeconomic History   Marital status: Widowed    Spouse name: Not on file   Number of children: 2   Years of education: Not on file   Highest education level: Not on file  Occupational History   Not on file  Tobacco Use   Smoking status: Never   Smokeless tobacco: Never  Vaping Use   Vaping Use: Never used  Substance and Sexual Activity   Alcohol use: No    Alcohol/week: 0.0 standard drinks of alcohol   Drug use: No   Sexual activity: Not Currently    Birth control/protection: Surgical  Other Topics Concern   Not on file  Social History Narrative   Not on file   Social Determinants of Health   Financial Resource Strain: Low Risk  (07/25/2020)   Overall Financial Resource Strain (CARDIA)    Difficulty of Paying Living Expenses: Not hard at all  Food Insecurity: No Food Insecurity (07/25/2020)   Hunger Vital Sign    Worried About Running Out of Food in the Last Year: Never true    Ran Out of Food in the Last Year: Never true  Transportation Needs: No Transportation Needs (07/25/2020)   PRAPARE - Administrator, Civil Service (Medical): No    Lack of Transportation (Non-Medical): No  Physical Activity: Insufficiently Active (07/25/2020)   Exercise Vital Sign    Days of Exercise per Week: 1 day    Minutes of Exercise per Session: 30 min  Stress: No Stress Concern Present (07/25/2020)   [employer] of Occupational Health - Occupational Stress Questionnaire    Feeling of Stress : Not at all  Social Connections: Moderately Isolated (07/25/2020)   Social Connection and Isolation Panel [NHANES]    Frequency of Communication with Friends and Family: More than three times a week    Frequency of Social Gatherings with Friends and Family: Three times a week    Attends Religious Services: More than 4 times per year    Active Member of Clubs or Organizations: No    Attends Banker Meetings:  Never    Marital Status: Widowed  Intimate Partner Violence: Not At Risk (07/25/2020)   Humiliation, Afraid, Rape, and Kick questionnaire    Fear of Current or Ex-Partner: No    Emotionally Abused: No    Physically Abused: No  Sexually Abused: No    Family History: Family History  Problem Relation Age of Onset   Hypertension Father    Heart attack Sister 61   Heart attack Brother 61   Dementia Mother    Lupus Brother    Heart attack Brother    Hypertension Brother    Anesthesia problems Neg Hx    Hypotension Neg Hx    Malignant hyperthermia Neg Hx    Pseudochol deficiency Neg Hx     Current Medications:  Current Outpatient Medications:    acetaminophen (TYLENOL) 500 MG tablet, Take 500 mg by mouth 2 (two) times daily as needed for moderate pain or headache. , Disp: , Rfl:    allopurinol (ZYLOPRIM) 100 MG tablet, Take 100 mg by mouth at bedtime., Disp: , Rfl:    amLODipine (NORVASC) 10 MG tablet, TAKE 1 TABLET EVERY DAY  *DOSE  INCREASE*, Disp: 90 tablet, Rfl: 3   anastrozole (ARIMIDEX) 1 MG tablet, TAKE 1 TABLET BY MOUTH EVERY DAY, Disp: 90 tablet, Rfl: 2   Ascorbic Acid (VITAMIN C) 1000 MG tablet, Take 1,000 mg by mouth daily., Disp: , Rfl:    aspirin 81 MG EC tablet, Take 81 mg by mouth at bedtime., Disp: , Rfl:    atorvastatin (LIPITOR) 40 MG tablet, Take 1 tablet by mouth daily., Disp: , Rfl:    Cholecalciferol (VITAMIN D) 50 MCG (2000 UT) tablet, Take 2,000 Units by mouth daily., Disp: , Rfl:    colchicine 0.6 MG tablet, Take one tablet PO and may repeat in one hour if no improvement. Then take one tablet every 3 hours until pain is relieved., Disp: 30 tablet, Rfl: 0   COMIRNATY SUSP injection, , Disp: , Rfl:    fluticasone (FLONASE) 50 MCG/ACT nasal spray, Place 1 spray into both nostrils as needed for allergies or rhinitis., Disp: , Rfl:    hydrALAZINE (APRESOLINE) 25 MG tablet, Take 25 mg by mouth See admin instructions. Take 25 mg daily may take a second 25 mg dose  as needed for high blood pressure, Disp: , Rfl:    lisinopril (PRINIVIL,ZESTRIL) 40 MG tablet, Take 1 tablet (40 mg total) by mouth daily., Disp: 90 tablet, Rfl: 3   LUMIGAN 0.01 % SOLN, Place 1 drop into the right eye at bedtime., Disp: , Rfl:    Menthol, Topical Analgesic, (ICY HOT EX), Apply 1 Application topically daily as needed (pain)., Disp: , Rfl:    metFORMIN (GLUCOPHAGE) 500 MG tablet, Take 500 mg by mouth 2 (two) times daily with a meal. , Disp: , Rfl:    metoprolol tartrate (LOPRESSOR) 50 MG tablet, Take 1 tablet (50 mg total) by mouth 2 (two) times daily., Disp: 180 tablet, Rfl: 3   nitroGLYCERIN (NITROSTAT) 0.4 MG SL tablet, Place 0.4 mg under the tongue every 5 (five) minutes as needed for chest pain., Disp: , Rfl:    tiZANidine (ZANAFLEX) 4 MG tablet, Take by mouth as needed., Disp: , Rfl:    vitamin B-12 (CYANOCOBALAMIN) 500 MCG tablet, Take 500 mcg by mouth daily., Disp: , Rfl:    Allergies: No Known Allergies  REVIEW OF SYSTEMS:   Review of Systems  Constitutional:  Negative for chills, fatigue and fever.  HENT:   Negative for lump/mass, mouth sores, nosebleeds, sore throat and trouble swallowing.   Eyes:  Negative for eye problems.  Respiratory:  Negative for cough and shortness of breath.   Cardiovascular:  Negative for chest pain, leg swelling and palpitations.  Gastrointestinal:  Negative  for abdominal pain, constipation, diarrhea, nausea and vomiting.  Genitourinary:  Negative for bladder incontinence, difficulty urinating, dysuria, frequency, hematuria and nocturia.   Musculoskeletal:  Negative for arthralgias, back pain, flank pain, myalgias and neck pain.  Skin:  Negative for itching and rash.  Neurological:  Positive for numbness. Negative for dizziness and headaches.  Hematological:  Does not bruise/bleed easily.  Psychiatric/Behavioral:  Negative for depression, sleep disturbance and suicidal ideas. The patient is not nervous/anxious.   All other systems  reviewed and are negative.    VITALS:   Blood pressure (!) 162/63, pulse 64, temperature 98.2 F (36.8 C), temperature source Oral, resp. rate 18, weight 198 lb (89.8 kg), SpO2 100 %.  Wt Readings from Last 3 Encounters:  01/03/23 198 lb (89.8 kg)  08/13/22 205 lb 4.8 oz (93.1 kg)  07/24/22 208 lb 3.2 oz (94.4 kg)    Body mass index is 35.64 kg/m.  Performance status (ECOG): 1 - Symptomatic but completely ambulatory  PHYSICAL EXAM:   Physical Exam Vitals and nursing note reviewed. Exam conducted with a chaperone present.  Constitutional:      Appearance: Normal appearance.  Cardiovascular:     Rate and Rhythm: Normal rate and regular rhythm.     Pulses: Normal pulses.     Heart sounds: Normal heart sounds.  Pulmonary:     Effort: Pulmonary effort is normal.     Breath sounds: Normal breath sounds.  Abdominal:     Palpations: Abdomen is soft. There is no hepatomegaly, splenomegaly or mass.     Tenderness: There is no abdominal tenderness.  Musculoskeletal:     Right lower leg: No edema.     Left lower leg: No edema.  Lymphadenopathy:     Cervical: No cervical adenopathy.     Right cervical: No superficial, deep or posterior cervical adenopathy.    Left cervical: No superficial, deep or posterior cervical adenopathy.     Upper Body:     Right upper body: No supraclavicular or axillary adenopathy.     Left upper body: No supraclavicular or axillary adenopathy.  Neurological:     General: No focal deficit present.     Mental Status: She is alert and oriented to person, place, and time.  Psychiatric:        Mood and Affect: Mood normal.        Behavior: Behavior normal.     LABS:      Latest Ref Rng & Units 12/25/2022    9:22 AM 08/06/2022   11:50 AM 03/07/2022    4:00 PM  CBC  WBC 4.0 - 10.5 K/uL 7.6  6.9  9.4   Hemoglobin 12.0 - 15.0 g/dL 86.5  78.4  69.6   Hematocrit 36.0 - 46.0 % 38.3  37.5  38.1   Platelets 150 - 400 K/uL 271  262  259       Latest Ref  Rng & Units 12/25/2022    9:22 AM 08/06/2022   11:50 AM 03/07/2022    4:00 PM  CMP  Glucose 70 - 99 mg/dL 295  284  99   BUN 8 - 23 mg/dL 23  13  18    Creatinine 0.44 - 1.00 mg/dL 1.32  4.40  1.02   Sodium 135 - 145 mmol/L 138  141  139   Potassium 3.5 - 5.1 mmol/L 3.4  3.8  3.5   Chloride 98 - 111 mmol/L 103  105  108   CO2 22 - 32 mmol/L 22  25  21   Calcium 8.9 - 10.3 mg/dL 9.4  9.5  9.3   Total Protein 6.5 - 8.1 g/dL 7.8  7.9    Total Bilirubin 0.3 - 1.2 mg/dL 1.1  1.1    Alkaline Phos 38 - 126 U/L 47  51    AST 15 - 41 U/L 23  22    ALT 0 - 44 U/L 19  22       No results found for: "CEA1", "CEA" / No results found for: "CEA1", "CEA" No results found for: "PSA1" No results found for: "ZOX096" No results found for: "CAN125"  No results found for: "TOTALPROTELP", "ALBUMINELP", "A1GS", "A2GS", "BETS", "BETA2SER", "GAMS", "MSPIKE", "SPEI" No results found for: "TIBC", "FERRITIN", "IRONPCTSAT" Lab Results  Component Value Date   LDH 136 09/19/2010   LDH 135 02/22/2010     STUDIES:   MM DIAG BREAST TOMO BILATERAL  Result Date: 12/25/2022 CLINICAL DATA:  77 year old female with history of recurrent left breast cancer post lumpectomy initially 03/03/2019 and subsequently 03/14/2022 followed by radiation therapy. EXAM: DIGITAL DIAGNOSTIC BILATERAL MAMMOGRAM WITH TOMOSYNTHESIS TECHNIQUE: Bilateral digital diagnostic mammography and breast tomosynthesis was performed. COMPARISON:  Previous exam(s). ACR Breast Density Category b: There are scattered areas of fibroglandular density. FINDINGS: No suspicious masses or calcifications are seen in either breast. New lumpectomy changes are identified in the upper slightly outer left breast. Coil shaped biopsy marking clip at site of prior biopsy in the central left breast. Spot compression magnification CC view of the left breast lumpectomy site was performed. There is no mammographic evidence of locally recurrent malignancy. IMPRESSION: New  lumpectomy changes in the left breast. No mammographic evidence of malignancy in either breast. RECOMMENDATION: Diagnostic mammogram is suggested in 1 year. (Code:[id number]-B-01Y) I have discussed the findings and recommendations with the patient. If applicable, a reminder letter will be sent to the patient regarding the next appointment. BI-RADS CATEGORY  2: Benign. Electronically Signed   By: Edwin Cap M.D.   On: 12/25/2022 10:51

## 2023-01-03 ENCOUNTER — Encounter: Payer: Self-pay | Admitting: Hematology

## 2023-01-03 ENCOUNTER — Inpatient Hospital Stay: Payer: Medicare HMO | Attending: Hematology | Admitting: Hematology

## 2023-01-03 VITALS — BP 162/63 | HR 64 | Temp 98.2°F | Resp 18 | Wt 198.0 lb

## 2023-01-03 DIAGNOSIS — E559 Vitamin D deficiency, unspecified: Secondary | ICD-10-CM

## 2023-01-03 DIAGNOSIS — Z923 Personal history of irradiation: Secondary | ICD-10-CM | POA: Insufficient documentation

## 2023-01-03 DIAGNOSIS — Z79811 Long term (current) use of aromatase inhibitors: Secondary | ICD-10-CM | POA: Insufficient documentation

## 2023-01-03 DIAGNOSIS — C50312 Malignant neoplasm of lower-inner quadrant of left female breast: Secondary | ICD-10-CM | POA: Insufficient documentation

## 2023-01-03 DIAGNOSIS — Z170 Estrogen receptor positive status [ER+]: Secondary | ICD-10-CM | POA: Insufficient documentation

## 2023-01-03 NOTE — Patient Instructions (Signed)
[facility] Discharge Instructions   You were seen and examined today by Dr. Ellin Saba.  He reviewed the results of your lab work which are normal.   He reviewed the results of your mammogram which was normal.   We will see you back in 6 months. We will repeat lab work prior to your next visit. We will also repeat a bone density test.     Thank you for choosing [facility] to provide your oncology and hematology care.  To afford each patient quality time with our provider, please arrive at least 15 minutes before your scheduled appointment time.   If you have a lab appointment with the Cancer Center please come in thru the Main Entrance and check in at the main information desk.  You need to re-schedule your appointment should you arrive 10 or more minutes late.  We strive to give you quality time with our providers, and arriving late affects you and other patients whose appointments are after yours.  Also, if you no show three or more times for appointments you may be dismissed from the clinic at the providers discretion.     Again, thank you for choosing [facility].  Our hope is that these requests will decrease the amount of time that you wait before being seen by our physicians.       _____________________________________________________________  Should you have questions after your visit to [facility], please contact our office at [phone] and follow the prompts.  Our office hours are 8:00 a.m. and 4:30 p.m. Monday - Friday.  Please note that voicemails left after 4:00 p.m. may not be returned until the following business day.  We are closed weekends and major holidays.  You do have access to a nurse 24-7, just call the main number to the clinic [phone] and do not press any options, hold on the line and a nurse will answer the phone.    For prescription refill requests, have  your pharmacy contact our office and allow 72 hours.    Due to Covid, you will need to wear a mask upon entering the hospital. If you do not have a mask, a mask will be given to you at the Main Entrance upon arrival. For doctor visits, patients may have 1 support person age 51 or older with them. For treatment visits, patients can not have anyone with them due to social distancing guidelines and our immunocompromised population.

## 2023-01-08 ENCOUNTER — Other Ambulatory Visit: Payer: Self-pay | Admitting: *Deleted

## 2023-01-08 DIAGNOSIS — I739 Peripheral vascular disease, unspecified: Secondary | ICD-10-CM

## 2023-01-08 DIAGNOSIS — I6523 Occlusion and stenosis of bilateral carotid arteries: Secondary | ICD-10-CM

## 2023-01-14 NOTE — Progress Notes (Unsigned)
[facility] [place] PROGRESS NOTE  ASSESSMENT / PLAN: [name] is a 77 y.o. 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 Progression of carotid disease noted. No progression of PAD.  I do not think the patient is a candidate for any type of intervention for carotid artery stenosis or claudication at the moment, but needs close monitoring.   The patient should continue best medical therapy for carotid artery stenosis including: Complete cessation from all tobacco products. Blood glucose control with goal A1c < 7%. Blood pressure control with goal blood pressure < 140/90 mmHg. Lipid reduction therapy with goal LDL-C <100 mg/dL (<16 if symptomatic from carotid artery stenosis).  Aspirin 81mg  PO QD.  Atorvastatin 40-80mg  PO QD (or other "high intensity" statin therapy).  Follow up in 12 months.   SUBJECTIVE: No neurologic symptoms. No recent stroke / TIA. Reports stable claudication. Continues to exercise in the pool at the [facility]. No ischemic rest pain. No ulceration.  OBJECTIVE: There were no vitals taken for this visit.  NAD RRR Unlabored No palpable pedal pulses.     Latest Ref Rng & Units 12/25/2022    9:22 AM 08/06/2022   11:50 AM 03/07/2022    4:00 PM  CBC  WBC 4.0 - 10.5 K/uL 7.6  6.9  9.4   Hemoglobin 12.0 - 15.0 g/dL 10.9  60.4  54.0   Hematocrit 36.0 - 46.0 % 38.3  37.5  38.1   Platelets 150 - 400 K/uL 271  262  259         Latest Ref Rng & Units 12/25/2022    9:22 AM 08/06/2022   11:50 AM 03/07/2022    4:00 PM  CMP  Glucose 70 - 99 mg/dL 981  191  99   BUN 8 - 23 mg/dL 23  13  18    Creatinine 0.44 - 1.00 mg/dL 4.78  2.95  6.21   Sodium 135 - 145 mmol/L 138  141  139   Potassium 3.5 - 5.1 mmol/L 3.4  3.8  3.5   Chloride 98 - 111 mmol/L 103  105  108   CO2 22 - 32 mmol/L 22  25  21    Calcium 8.9 -  10.3 mg/dL 9.4  9.5  9.3   Total Protein 6.5 - 8.1 g/dL 7.8  7.9    Total Bilirubin 0.3 - 1.2 mg/dL 1.1  1.1    Alkaline Phos 38 - 126 U/L 47  51    AST 15 - 41 U/L 23  22    ALT 0 - 44 U/L 19  22      Estimated Creatinine Clearance: 42.5 mL/min (A) (by C-G formula based on SCr of 1.15 mg/dL (H)).  +-------+-----------+-----------+------------+------------+  ABI/TBIToday's ABIToday's TBIPrevious ABIPrevious TBI  +-------+-----------+-----------+------------+------------+  Right  0.95       0.56       0.84        0.56          +-------+-----------+-----------+------------+------------+  Left   0.98       0.65       0.69        0.42          +-------+-----------+-----------+------------+------------+   Carotid duplex Right Carotid: Velocities in the right ICA are consistent with a 60-79%                 stenosis.  Left Carotid: Velocities in the left ICA are consistent with a 1-39%  stenosis.   Vertebrals:  Bilateral vertebral arteries demonstrate antegrade flow.  Subclavians: Normal flow hemodynamics were seen in bilateral subclavian               arteries.   Rande Brunt. Lenell Antu, MD [facility] Phone Number: [phone] 01/14/2023 7:28 AM

## 2023-01-15 ENCOUNTER — Encounter: Payer: Self-pay | Admitting: Vascular Surgery

## 2023-01-15 ENCOUNTER — Ambulatory Visit (HOSPITAL_COMMUNITY)
Admission: RE | Admit: 2023-01-15 | Discharge: 2023-01-15 | Disposition: A | Discharge status: Home / Self Care | Payer: Medicare HMO | Source: Ambulatory Visit | Attending: Vascular Surgery | Admitting: Vascular Surgery

## 2023-01-15 ENCOUNTER — Ambulatory Visit: Payer: Medicare HMO | Admitting: Vascular Surgery

## 2023-01-15 ENCOUNTER — Ambulatory Visit (INDEPENDENT_AMBULATORY_CARE_PROVIDER_SITE_OTHER)
Admission: RE | Admit: 2023-01-15 | Discharge: 2023-01-15 | Disposition: A | Discharge status: Home / Self Care | Payer: Medicare HMO | Source: Ambulatory Visit | Attending: Vascular Surgery | Admitting: Vascular Surgery

## 2023-01-15 VITALS — BP 156/70 | HR 60 | Temp 98.2°F | Resp 20 | Ht 62.5 in | Wt 202.0 lb

## 2023-01-15 DIAGNOSIS — I6523 Occlusion and stenosis of bilateral carotid arteries: Secondary | ICD-10-CM | POA: Diagnosis not present

## 2023-01-15 DIAGNOSIS — I739 Peripheral vascular disease, unspecified: Secondary | ICD-10-CM | POA: Diagnosis not present

## 2023-01-15 LAB — VAS US ABI WITH/WO TBI
Left ABI: 0.73
Right ABI: 0.8

## 2023-01-17 ENCOUNTER — Encounter: Payer: Self-pay | Admitting: Cardiology

## 2023-01-17 ENCOUNTER — Ambulatory Visit: Payer: Medicare HMO | Attending: Cardiology | Admitting: Cardiology

## 2023-01-17 VITALS — BP 150/70 | HR 67 | Ht 63.0 in | Wt 202.6 lb

## 2023-01-17 DIAGNOSIS — E782 Mixed hyperlipidemia: Secondary | ICD-10-CM | POA: Diagnosis not present

## 2023-01-17 DIAGNOSIS — I10 Essential (primary) hypertension: Secondary | ICD-10-CM

## 2023-01-17 DIAGNOSIS — I2510 Atherosclerotic heart disease of native coronary artery without angina pectoris: Secondary | ICD-10-CM

## 2023-01-17 NOTE — Progress Notes (Signed)
Clinical Summary Ms. Schnell is a 77 y.o.female seen today for follow up of the following medical problems.      1. CAD   - CABG Jan 2013: 3 vessel (LIMA to LAD, SVG to OM, SVG to acute marginal).She had prior stenting as described below   - Jan 2013 echo LVEF 55-60%       - no chest pains, denies SOB/DOE - compliant with meds     2. Hyperlipidemia   -upcoming labs with pcp - she is on atorvastain   Jan 2023 TC 127 TG 146 HDL 33 LDL 68  - upcoming labs with pcp - she is on atorvastatin 40mg  daily.    3. HTN   - home bp's 120s-130s/70s - compliant with meds     4. PAD - followed by vascular. - carotid stenosis followed by vascular, LE arterial disease as well   5. 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; left breast DCIS 02/05/22   Carotid artery occlusion    Chronic kidney disease    mild, stage 1   DM (diabetes mellitus) (HCC)    takes Metformin 1000mg  BID   Dyslipidemia    takes Pravastatin daily   Early cataracts, bilateral    Gout    takes Allopurinol daily   Herniated disc    left   Lumbar herniated disc    [facility]ocardial [facility]) 2008   pt has 2 stents   Nocturia    Obesity    Unspecified essential hypertension    takes Metoprolol,Lisinopril,and Amlodipine   Urinary frequency      No Known Allergies   Current Outpatient Medications  Medication Sig Dispense Refill   acetaminophen (TYLENOL) 500 MG tablet Take 500 mg by mouth 2 (two) times daily as needed for moderate pain or headache.       allopurinol (ZYLOPRIM) 100 MG tablet Take 100 mg by mouth at bedtime.     amLODipine (NORVASC) 10 MG tablet TAKE 1 TABLET EVERY DAY  *DOSE  INCREASE* 90 tablet 3   anastrozole (ARIMIDEX) 1 MG tablet TAKE 1 TABLET BY MOUTH EVERY DAY 90 tablet 2   Ascorbic Acid (VITAMIN C) 1000 MG tablet Take 1,000 mg by mouth daily.     aspirin 81 MG EC tablet Take 81 mg by mouth at bedtime.     atorvastatin (LIPITOR) 40 MG tablet Take 1 tablet by mouth daily.     Cholecalciferol (VITAMIN D) 50 MCG (2000 UT) tablet Take 2,000 Units by mouth daily.     colchicine 0.6 MG tablet Take one tablet PO and may repeat in one hour if no improvement. Then take one tablet every 3 hours until pain is relieved. 30 tablet 0   COMIRNATY SUSP injection      fluticasone (FLONASE) 50 MCG/ACT nasal spray Place 1 spray into both nostrils as needed for allergies or rhinitis.     hydrALAZINE (APRESOLINE) 25 MG tablet Take 25 mg  by mouth See admin instructions. Take 25 mg daily may take a second 25 mg dose as needed for high blood pressure     lisinopril (PRINIVIL,ZESTRIL) 40 MG tablet Take 1 tablet (40 mg total) by mouth daily. 90 tablet 3   LUMIGAN 0.01 % SOLN Place 1 drop into the right eye at bedtime.     Menthol, Topical Analgesic, (ICY HOT EX) Apply 1 Application topically daily as needed (pain).     metFORMIN (GLUCOPHAGE) 500 MG tablet Take 500 mg by mouth 2 (two) times daily with a meal.      metoprolol tartrate (LOPRESSOR) 50 MG tablet Take 1 tablet (50 mg total) by mouth 2 (two) times daily. 180 tablet 3   nitroGLYCERIN (NITROSTAT) 0.4 MG SL tablet Place 0.4 mg under the tongue every 5 (five) minutes as needed for chest pain.     tiZANidine (ZANAFLEX) 4 MG tablet Take by mouth as needed.     vitamin B-12 (CYANOCOBALAMIN) 500 MCG tablet Take 500 mcg by mouth daily.     No current facility-administered medications for this visit.     Past Surgical History:  Procedure Laterality Date   ABDOMINAL HYSTERECTOMY     BACK  SURGERY  2009/2010/2011   BREAST BIOPSY  unknown   left   BREAST BIOPSY Left 02/05/2022   Times 2   BREAST LUMPECTOMY WITH RADIOACTIVE SEED LOCALIZATION Left 03/03/2019   Procedure: LEFT BREAST LUMPECTOMY WITH RADIOACTIVE SEED LOCALIZATION;  Surgeon: Emelia Loron, MD;  Location: [facility];  Service: General;  Laterality: Left;   BREAST LUMPECTOMY WITH RADIOACTIVE SEED LOCALIZATION Left 03/14/2022   Procedure: LEFT BREAST SEED GUIDED LUMPECTOMY;  Surgeon: Emelia Loron, MD;  Location: [facility] OR;  Service: General;  Laterality: Left;   CARDIAC CATHETERIZATION  08/27/2006   NON-STEMI/TAXUS STENTING 100% PROXIMAL LAD (X2) JANUARY 2008   CARDIAC CATHETERIZATION  08/27/2010   occluded LAD stents, Om1 : 70%, RCA: 80%, Ef 40-45%   CARPAL TUNNEL RELEASE     bilateral   CATARACT EXTRACTION W/PHACO Right 10/11/2017   Procedure: CATARACT EXTRACTION PHACO AND INTRAOCULAR LENS PLACEMENT RIGHT EYE;  Surgeon: Fabio Pierce, MD;  Location: [facility];  Service: Ophthalmology;  Laterality: Right;  CDE: 5.17   CATARACT EXTRACTION W/PHACO Left 12/27/2017   Procedure: CATARACT EXTRACTION PHACO AND INTRAOCULAR LENS PLACEMENT (IOC);  Surgeon: Fabio Pierce, MD;  Location: [facility];  Service: Ophthalmology;  Laterality: Left;  CDE: 2.99   CHOLECYSTECTOMY  not sure   COLONOSCOPY     CORONARY ARTERY BYPASS GRAFT  09/05/2011   Procedure: CORONARY ARTERY BYPASS GRAFTING (CABG);  Surgeon: Delight Ovens, MD;  Location: [facility] OR;  Service: Open Heart Surgery;  Laterality: N/A;  CABG times three using left internal mammary artery and left leg greater saphenous vein harvested endoscopically   LEG TENDON SURGERY  unknown   left    LUMBAR LAMINECTOMY/DECOMPRESSION MICRODISCECTOMY Left 06/04/2014   Procedure: LUMBAR LAMINECTOMY/DECOMPRESSION MICRODISCECTOMY LUMBAR FOUR-FIVE;  Surgeon: Mariam Dollar, MD;  Location: [facility];  Service: Neurosurgery;  Laterality: Left;   MASS EXCISION Left 08/08/2018    Procedure: EXCISION 5CM LIPOMA ON BACK;  Surgeon: Lucretia Roers, MD;  Location: [facility];  Service: General;  Laterality: Left;   TONSILLECTOMY       No Known Allergies    Family History  Problem Relation Age of Onset   Hypertension Father    Heart attack Sister 76   Heart attack Brother 63   Dementia Mother  Lupus Brother    Heart attack Brother    Hypertension Brother    Anesthesia problems Neg Hx    Hypotension Neg Hx    Malignant hyperthermia Neg Hx    Pseudochol deficiency Neg Hx      Social History Ms. Bischoff reports that she has never smoked. She has never used smokeless tobacco. Ms. Calbert reports no history of alcohol use.   Review of Systems CONSTITUTIONAL: No weight loss, fever, chills, weakness or fatigue.  HEENT: Eyes: No visual loss, blurred vision, double vision or yellow sclerae.No hearing loss, sneezing, congestion, runny nose or sore throat.  SKIN: No rash or itching.  CARDIOVASCULAR: per hpi RESPIRATORY: per hpi GASTROINTESTINAL: No anorexia, nausea, vomiting or diarrhea. No abdominal pain or blood.  GENITOURINARY: No burning on urination, no polyuria NEUROLOGICAL: No headache, dizziness, syncope, paralysis, ataxia, numbness or tingling in the extremities. No change in bowel or bladder control.  MUSCULOSKELETAL: No muscle, back pain, joint pain or stiffness.  LYMPHATICS: No enlarged nodes. No history of splenectomy.  PSYCHIATRIC: No history of depression or anxiety.  ENDOCRINOLOGIC: No reports of sweating, cold or heat intolerance. No polyuria or polydipsia.  [name]   Physical Examination Today's Vitals   01/17/23 1352  BP: (!) 150/70  Pulse: 67  SpO2: 98%  Weight: 202 lb 9.6 oz (91.9 kg)  Height: 5\' 3"  (1.6 m)  Manual recheck 125/75   Body mass index is 35.89 kg/m.  Gen: resting comfortably, no acute distress HEENT: no scleral icterus, pupils equal round and reactive, no palptable cervical adenopathy,  CV: RRR, no m/rg, no jvd Resp:  Clear to auscultation bilaterally GI: abdomen is soft, non-tender, non-distended, normal bowel sounds, no hepatosplenomegaly MSK: extremities are warm, no edema.  Skin: warm, no rash Neuro:  no focal deficits Psych: appropriate affect   Diagnostic Studies     Assessment and Plan   1. CAD   - no recent symptoms, continue current meds   2. HTN   At goal based on home numbers and manual recheck, continue current meds   3. Hyperlipidemia   - LDL remains at goal, continue current meds   F/u 6 months     Antoine Poche, M.D

## 2023-01-18 MED ORDER — NITROGLYCERIN 0.4 MG SL SUBL: 0.4000 mg | SUBLINGUAL_TABLET | SUBLINGUAL | 3 refills | Status: AC | PRN

## 2023-01-18 NOTE — Patient Instructions (Signed)
Medication Instructions:   Nitroglycerin refill sent to pharmacy on 01/18/2023.   Continue all other medications.     Labwork:  none  Testing/Procedures:  none  Follow-Up:  6 months   Any Other Special Instructions Will Be Listed Below (If Applicable).   If you need a refill on your cardiac medications before your next appointment, please call your pharmacy.

## 2023-01-22 ENCOUNTER — Ambulatory Visit: Payer: Medicare HMO | Admitting: Hematology

## 2023-01-24 ENCOUNTER — Other Ambulatory Visit: Payer: Self-pay

## 2023-01-24 DIAGNOSIS — I6523 Occlusion and stenosis of bilateral carotid arteries: Secondary | ICD-10-CM

## 2023-01-24 DIAGNOSIS — I739 Peripheral vascular disease, unspecified: Secondary | ICD-10-CM

## 2023-02-07 DIAGNOSIS — H401211 Low-tension glaucoma, right eye, mild stage: Secondary | ICD-10-CM | POA: Diagnosis not present

## 2023-03-05 DIAGNOSIS — E7849 Other hyperlipidemia: Secondary | ICD-10-CM | POA: Diagnosis not present

## 2023-03-05 DIAGNOSIS — E1165 Type 2 diabetes mellitus with hyperglycemia: Secondary | ICD-10-CM | POA: Diagnosis not present

## 2023-03-05 DIAGNOSIS — I10 Essential (primary) hypertension: Secondary | ICD-10-CM | POA: Diagnosis not present

## 2023-03-05 DIAGNOSIS — Z1329 Encounter for screening for other suspected endocrine disorder: Secondary | ICD-10-CM | POA: Diagnosis not present

## 2023-03-05 DIAGNOSIS — N182 Chronic kidney disease, stage 2 (mild): Secondary | ICD-10-CM | POA: Diagnosis not present

## 2023-03-05 DIAGNOSIS — E1122 Type 2 diabetes mellitus with diabetic chronic kidney disease: Secondary | ICD-10-CM | POA: Diagnosis not present

## 2023-03-12 DIAGNOSIS — E7849 Other hyperlipidemia: Secondary | ICD-10-CM | POA: Diagnosis not present

## 2023-03-12 DIAGNOSIS — I2510 Atherosclerotic heart disease of native coronary artery without angina pectoris: Secondary | ICD-10-CM | POA: Diagnosis not present

## 2023-03-12 DIAGNOSIS — D0512 Intraductal carcinoma in situ of left breast: Secondary | ICD-10-CM | POA: Diagnosis not present

## 2023-03-12 DIAGNOSIS — D126 Benign neoplasm of colon, unspecified: Secondary | ICD-10-CM | POA: Diagnosis not present

## 2023-03-12 DIAGNOSIS — Z0001 Encounter for general adult medical examination with abnormal findings: Secondary | ICD-10-CM | POA: Diagnosis not present

## 2023-03-12 DIAGNOSIS — Z955 Presence of coronary angioplasty implant and graft: Secondary | ICD-10-CM | POA: Diagnosis not present

## 2023-03-12 DIAGNOSIS — N1831 Chronic kidney disease, stage 3a: Secondary | ICD-10-CM | POA: Diagnosis not present

## 2023-03-12 DIAGNOSIS — E1151 Type 2 diabetes mellitus with diabetic peripheral angiopathy without gangrene: Secondary | ICD-10-CM | POA: Diagnosis not present

## 2023-03-12 DIAGNOSIS — M109 Gout, unspecified: Secondary | ICD-10-CM | POA: Diagnosis not present

## 2023-03-12 DIAGNOSIS — E1122 Type 2 diabetes mellitus with diabetic chronic kidney disease: Secondary | ICD-10-CM | POA: Diagnosis not present

## 2023-03-20 DIAGNOSIS — I129 Hypertensive chronic kidney disease with stage 1 through stage 4 chronic kidney disease, or unspecified chronic kidney disease: Secondary | ICD-10-CM | POA: Diagnosis not present

## 2023-03-20 DIAGNOSIS — E1122 Type 2 diabetes mellitus with diabetic chronic kidney disease: Secondary | ICD-10-CM | POA: Diagnosis not present

## 2023-03-20 DIAGNOSIS — R809 Proteinuria, unspecified: Secondary | ICD-10-CM | POA: Diagnosis not present

## 2023-03-20 DIAGNOSIS — E1129 Type 2 diabetes mellitus with other diabetic kidney complication: Secondary | ICD-10-CM | POA: Diagnosis not present

## 2023-04-20 ENCOUNTER — Emergency Department (HOSPITAL_COMMUNITY)
Admission: EM | Admit: 2023-04-20 | Discharge: 2023-04-20 | Disposition: A | Discharge status: Home / Self Care | Payer: Medicare HMO | Source: Home / Self Care | Attending: Emergency Medicine | Admitting: Emergency Medicine

## 2023-04-20 ENCOUNTER — Other Ambulatory Visit: Payer: Self-pay

## 2023-04-20 ENCOUNTER — Encounter (HOSPITAL_COMMUNITY): Payer: Self-pay | Admitting: Emergency Medicine

## 2023-04-20 DIAGNOSIS — Z7984 Long term (current) use of oral hypoglycemic drugs: Secondary | ICD-10-CM | POA: Diagnosis not present

## 2023-04-20 DIAGNOSIS — R300 Dysuria: Secondary | ICD-10-CM | POA: Diagnosis not present

## 2023-04-20 DIAGNOSIS — E1122 Type 2 diabetes mellitus with diabetic chronic kidney disease: Secondary | ICD-10-CM | POA: Diagnosis not present

## 2023-04-20 DIAGNOSIS — Z79899 Other long term (current) drug therapy: Secondary | ICD-10-CM | POA: Diagnosis not present

## 2023-04-20 DIAGNOSIS — I2510 Atherosclerotic heart disease of native coronary artery without angina pectoris: Secondary | ICD-10-CM | POA: Diagnosis not present

## 2023-04-20 DIAGNOSIS — I129 Hypertensive chronic kidney disease with stage 1 through stage 4 chronic kidney disease, or unspecified chronic kidney disease: Secondary | ICD-10-CM | POA: Diagnosis not present

## 2023-04-20 DIAGNOSIS — Z853 Personal history of malignant neoplasm of breast: Secondary | ICD-10-CM | POA: Diagnosis not present

## 2023-04-20 DIAGNOSIS — N181 Chronic kidney disease, stage 1: Secondary | ICD-10-CM | POA: Diagnosis not present

## 2023-04-20 DIAGNOSIS — Z7982 Long term (current) use of aspirin: Secondary | ICD-10-CM | POA: Insufficient documentation

## 2023-04-20 LAB — URINALYSIS, ROUTINE W REFLEX MICROSCOPIC
Bacteria, UA: NONE SEEN
Bilirubin Urine: NEGATIVE
Glucose, UA: NEGATIVE mg/dL
Hgb urine dipstick: NEGATIVE
Ketones, ur: NEGATIVE mg/dL
Nitrite: NEGATIVE
Protein, ur: NEGATIVE mg/dL
Specific Gravity, Urine: 1.005 (ref 1.005–1.030)
pH: 5 (ref 5.0–8.0)

## 2023-04-20 MED ORDER — CEPHALEXIN 500 MG PO CAPS
500.0000 mg | ORAL_CAPSULE | Freq: Once | ORAL | Status: AC
  Administered 2023-04-20: 500 mg via ORAL
  Filled 2023-04-20: qty 1

## 2023-04-20 MED ORDER — CEPHALEXIN 500 MG PO CAPS: 500.0000 mg | ORAL_CAPSULE | Freq: Three times a day (TID) | ORAL | 0 refills | Status: DC

## 2023-04-20 NOTE — ED Provider Notes (Signed)
[place] EMERGENCY DEPARTMENT AT [facility] Provider Note   CSN: [id number] Arrival date & time: 04/20/23  9629     History  Chief Complaint  Patient presents with   Dysuria    [name] is a 77 y.o. female.  The history is provided by the patient.  Pt presents for concern for UTI She reports over the past 24 hrs she has urinary frequency and dysuria No fever/vomiting but she does report nausea No new back or flank pain She reports mild suprapubic pain She does not get UTI's frequently No other acute complaints    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; left breast DCIS 02/05/22   Carotid artery occlusion    Chronic kidney disease    mild, stage 1   DM (diabetes mellitus) (HCC)    takes Metformin 1000mg  BID   Dyslipidemia    takes Pravastatin daily   Early cataracts, bilateral    Gout    takes Allopurinol daily   Herniated disc    left   Lumbar herniated disc    Myocardial infarction [facility]) 2008   pt has 2 stents   Nocturia    Obesity    Unspecified essential hypertension    takes Metoprolol,Lisinopril,and Amlodipine   Urinary frequency     Home Medications Prior to Admission medications   Medication Sig Start Date End Date Taking? Authorizing Provider  cephALEXin (KEFLEX) 500 MG capsule Take 1 capsule (500 mg total) by mouth 3 (three) times daily. 04/20/23  Yes Zadie Rhine, MD  acetaminophen (TYLENOL) 500 MG tablet Take 500 mg by mouth 2 (two) times daily as needed for moderate pain or headache.     [provider]  allopurinol (ZYLOPRIM) 100 MG  tablet Take 100 mg by mouth at bedtime.    [provider]  amLODipine (NORVASC) 10 MG tablet TAKE 1 TABLET EVERY DAY  *DOSE  INCREASE* 12/15/15   Antoine Poche, MD  anastrozole (ARIMIDEX) 1 MG tablet TAKE 1 TABLET BY MOUTH EVERY DAY 11/26/22   Doreatha Massed, MD  Ascorbic Acid (VITAMIN C) 1000 MG tablet Take 1,000 mg by mouth daily.    [provider]  aspirin 81 MG EC tablet Take 81 mg by mouth at bedtime.    [provider]  atorvastatin (LIPITOR) 40 MG tablet Take 1 tablet by mouth daily. 11/30/20   [provider]  Cholecalciferol (VITAMIN D) 50 MCG (2000 UT) tablet Take 2,000 Units by mouth daily.    [provider]  colchicine 0.6 MG tablet Take one tablet PO and may repeat in one hour if no improvement. Then take one tablet every 3 hours until pain is relieved. 06/20/14   Janne Napoleon, NP  COMIRNATY SUSP injection  06/11/22   [provider]  fluticasone (FLONASE) 50 MCG/ACT nasal spray Place 1 spray into both nostrils as needed for allergies or rhinitis. 05/10/20   [provider]  hydrALAZINE (APRESOLINE) 25 MG tablet Take 25 mg by mouth See admin instructions. Take 25 mg daily  may take a second 25 mg dose as needed for high blood pressure 02/23/21 01/17/23  [provider]  lisinopril (PRINIVIL,ZESTRIL) 40 MG tablet Take 1 tablet (40 mg total) by mouth daily. 10/23/11   [name], MD  LUMIGAN 0.01 % SOLN Place 1 drop into the right eye at bedtime. 01/30/22   [provider]  Menthol, Topical Analgesic, (ICY HOT EX) Apply 1 Application topically daily as needed (pain).    [provider]  metFORMIN (GLUCOPHAGE) 500 MG tablet Take 500 mg by mouth 2 (two) times daily with a meal.     [provider]  metoprolol tartrate (LOPRESSOR) 50 MG tablet Take 1 tablet (50 mg total) by mouth 2 (two) times daily. 12/27/20   Antoine Poche, MD  nitroGLYCERIN (NITROSTAT) 0.4 MG SL tablet Place 1  tablet (0.4 mg total) under the tongue every 5 (five) minutes x 3 doses as needed for chest pain (If no relief after 3rd dose, proceed to the ED or call 911). 01/18/23   Antoine Poche, MD  tiZANidine (ZANAFLEX) 4 MG tablet Take by mouth as needed. 09/17/17   [provider]  vitamin B-12 (CYANOCOBALAMIN) 500 MCG tablet Take 500 mcg by mouth daily.    [provider]      Allergies    Patient has no known allergies.    Review of Systems   Review of Systems  Constitutional:  Negative for fever.  Gastrointestinal:  Positive for vomiting.  Genitourinary:  Positive for dysuria and frequency. Negative for vaginal bleeding and vaginal discharge.    Physical Exam Updated Vital Signs BP (!) 165/64 (BP Location: Right Arm)   Pulse 63   Temp 98.3 F (36.8 C) (Oral)   Resp 20   Ht 1.6 m (5\' 3" )   Wt 93 kg   SpO2 98%   BMI 36.31 kg/m  Physical Exam CONSTITUTIONAL: elderly, no distress HEAD: Normocephalic/atraumatic ENMT: Mucous membranes moist NECK: supple no meningeal signs CV: S1/S2 noted, no murmurs/rubs/gallops noted LUNGS: Lungs are clear to auscultation bilaterally, no apparent distress ABDOMEN: soft, mild SP tenderness, no rebound or guarding GU:no cva tenderness NEURO: Pt is awake/alert/appropriate, moves all extremitiesx4.  No facial droop.   EXTREMITIES: pulses normal/equal, full ROM SKIN: warm, color normal PSYCH: no abnormalities of mood noted, alert and oriented to situation  ED Results / Procedures / Treatments   Labs (all labs ordered are listed, but only abnormal results are displayed) Labs Reviewed  URINALYSIS, ROUTINE W REFLEX MICROSCOPIC - Abnormal; Notable for the following components:      Result Value   Color, Urine STRAW (*)    Leukocytes,Ua TRACE (*)    All other components within normal limits    EKG None  Radiology No results found.  Procedures Procedures    Medications Ordered in ED Medications  cephALEXin (KEFLEX)  capsule 500 mg (has no administration in time range)    ED Course/ Medical Decision Making/ A&P                                 Medical Decision Making Amount and/or Complexity of Data Reviewed Labs: ordered.  Risk Prescription drug management.   Pt reports symptoms of UTI - dysuria and frequency Now having nausea No fever/vomiting Pt is well appearing She is not septic appearing Plan to treat due to her symptoms Otherwise she is safe for d/c home  Final Clinical Impression(s) / ED Diagnoses Final diagnoses:  Dysuria    Rx / DC Orders ED Discharge Orders          Ordered    cephALEXin (KEFLEX) 500 MG capsule  3 times daily        04/20/23 0640              Zadie Rhine, MD 04/20/23 [phone]

## 2023-04-20 NOTE — ED Triage Notes (Signed)
Pt states she thinks she has a UTI. C/o burning with urination and frequency.

## 2023-05-02 DIAGNOSIS — Z6835 Body mass index (BMI) 35.0-35.9, adult: Secondary | ICD-10-CM | POA: Diagnosis not present

## 2023-05-02 DIAGNOSIS — R300 Dysuria: Secondary | ICD-10-CM | POA: Diagnosis not present

## 2023-05-02 DIAGNOSIS — R030 Elevated blood-pressure reading, without diagnosis of hypertension: Secondary | ICD-10-CM | POA: Diagnosis not present

## 2023-05-08 LAB — AMB RESULTS CONSOLE CBG: Glucose: 151

## 2023-05-08 NOTE — Progress Notes (Signed)
Patient presents to the mobile for bp and glucose screening. She takes medications daily to manage her diabetes and hypertension. She recently had appointment with PCP

## 2023-05-29 ENCOUNTER — Encounter: Payer: Self-pay | Admitting: *Deleted

## 2023-05-29 NOTE — Progress Notes (Signed)
Pt attended 05/08/23 screening event where her b/p was 134/55 and her blood sugar was 151. At the event the pt did not identify and SDOH insecurities and told event nurse she had had recent appt with her PCP (r/t to b/p and blood sugar f/u.) Chart review indicates pt was last seen by her PCP Dr. Quintin Alto from the [facility] on 03/12/23 (PCP past visits visible in [facility] but not PCP notes or future appt). Chart review documents pt saw her nephrologist on 03/20/23 and reveals that pt has ongoing oncology f/u appt also. No additional health equity team support indicated at this time.

## 2023-06-04 DIAGNOSIS — N189 Chronic kidney disease, unspecified: Secondary | ICD-10-CM | POA: Diagnosis not present

## 2023-06-04 DIAGNOSIS — E1129 Type 2 diabetes mellitus with other diabetic kidney complication: Secondary | ICD-10-CM | POA: Diagnosis not present

## 2023-06-04 DIAGNOSIS — R809 Proteinuria, unspecified: Secondary | ICD-10-CM | POA: Diagnosis not present

## 2023-06-04 DIAGNOSIS — I129 Hypertensive chronic kidney disease with stage 1 through stage 4 chronic kidney disease, or unspecified chronic kidney disease: Secondary | ICD-10-CM | POA: Diagnosis not present

## 2023-06-04 DIAGNOSIS — N1832 Chronic kidney disease, stage 3b: Secondary | ICD-10-CM | POA: Diagnosis not present

## 2023-06-04 DIAGNOSIS — E1122 Type 2 diabetes mellitus with diabetic chronic kidney disease: Secondary | ICD-10-CM | POA: Diagnosis not present

## 2023-06-10 DIAGNOSIS — N1831 Chronic kidney disease, stage 3a: Secondary | ICD-10-CM | POA: Diagnosis not present

## 2023-06-10 DIAGNOSIS — R809 Proteinuria, unspecified: Secondary | ICD-10-CM | POA: Diagnosis not present

## 2023-06-10 DIAGNOSIS — E1122 Type 2 diabetes mellitus with diabetic chronic kidney disease: Secondary | ICD-10-CM | POA: Diagnosis not present

## 2023-06-10 DIAGNOSIS — E1129 Type 2 diabetes mellitus with other diabetic kidney complication: Secondary | ICD-10-CM | POA: Diagnosis not present

## 2023-06-14 DIAGNOSIS — H401211 Low-tension glaucoma, right eye, mild stage: Secondary | ICD-10-CM | POA: Diagnosis not present

## 2023-06-29 DIAGNOSIS — R051 Acute cough: Secondary | ICD-10-CM | POA: Diagnosis not present

## 2023-06-29 DIAGNOSIS — Z20822 Contact with and (suspected) exposure to covid-19: Secondary | ICD-10-CM | POA: Diagnosis not present

## 2023-07-01 ENCOUNTER — Inpatient Hospital Stay: Payer: Medicare HMO

## 2023-07-01 ENCOUNTER — Other Ambulatory Visit (HOSPITAL_COMMUNITY): Payer: Medicare HMO

## 2023-07-08 ENCOUNTER — Inpatient Hospital Stay: Payer: Medicare HMO | Admitting: Hematology

## 2023-07-09 DIAGNOSIS — Z6834 Body mass index (BMI) 34.0-34.9, adult: Secondary | ICD-10-CM | POA: Diagnosis not present

## 2023-07-09 DIAGNOSIS — J209 Acute bronchitis, unspecified: Secondary | ICD-10-CM | POA: Diagnosis not present

## 2023-07-09 DIAGNOSIS — R030 Elevated blood-pressure reading, without diagnosis of hypertension: Secondary | ICD-10-CM | POA: Diagnosis not present

## 2023-08-05 ENCOUNTER — Ambulatory Visit: Payer: Medicare HMO | Attending: Cardiology | Admitting: Cardiology

## 2023-08-05 ENCOUNTER — Encounter: Payer: Self-pay | Admitting: Cardiology

## 2023-08-05 VITALS — BP 128/62 | HR 73 | Ht 63.0 in | Wt 196.8 lb

## 2023-08-05 DIAGNOSIS — E782 Mixed hyperlipidemia: Secondary | ICD-10-CM

## 2023-08-05 DIAGNOSIS — I2510 Atherosclerotic heart disease of native coronary artery without angina pectoris: Secondary | ICD-10-CM

## 2023-08-05 DIAGNOSIS — I10 Essential (primary) hypertension: Secondary | ICD-10-CM

## 2023-08-05 NOTE — Progress Notes (Signed)
Clinical Summary Ms. Burdsall is a 77 y.o.female seen today for follow up of the following medical problems.      1. CAD   - CABG Jan 2013: 3 vessel (LIMA to LAD, SVG to OM, SVG to acute marginal).She had prior stenting as described below   - Jan 2013 echo LVEF 55-60%       - no chest pain, no SOB/DOE - compliant with meds     2. Hyperlipidemia   -upcoming labs with pcp - she is on atorvastain   Jan 2023 TC 127 TG 146 HDL 33 LDL 68 - she is on atorvastatin 40mg  daily.  - upcoming labs with pcp in Jan   3. HTN   - home bp's typicaly 130s/60s - compliant with meds     4. PAD - followed by vascular. - carotid stenosis followed by vascular, LE arterial disease as well   5. 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; left breast DCIS 02/05/22   Carotid artery occlusion    Chronic kidney disease    mild, stage 1   DM (diabetes mellitus) (HCC)    takes Metformin 1000mg  BID   Dyslipidemia    takes Pravastatin daily   Early cataracts, bilateral    Gout    takes Allopurinol daily   Herniated disc    left   Lumbar herniated disc    Myocardial infarction [facility]) 2008   pt has 2 stents   Nocturia    Obesity    Unspecified essential hypertension    takes Metoprolol,Lisinopril,and Amlodipine   Urinary frequency      No Known Allergies   Current Outpatient Medications  Medication Sig Dispense Refill   acetaminophen (TYLENOL) 500 MG tablet Take 500 mg by mouth 2 (two) times daily as needed for moderate pain or  headache.      allopurinol (ZYLOPRIM) 100 MG tablet Take 100 mg by mouth at bedtime.     amLODipine (NORVASC) 10 MG tablet TAKE 1 TABLET EVERY DAY  *DOSE  INCREASE* 90 tablet 3   anastrozole (ARIMIDEX) 1 MG tablet TAKE 1 TABLET BY MOUTH EVERY DAY 90 tablet 2   Ascorbic Acid (VITAMIN C) 1000 MG tablet Take 1,000 mg by mouth daily.     aspirin 81 MG EC tablet Take 81 mg by mouth at bedtime.     atorvastatin (LIPITOR) 40 MG tablet Take 1 tablet by mouth daily.     cephALEXin (KEFLEX) 500 MG capsule Take 1 capsule (500 mg total) by mouth 3 (three) times daily. 21 capsule 0   Cholecalciferol (VITAMIN D) 50 MCG (2000 UT) tablet Take 2,000 Units by mouth daily.     colchicine 0.6 MG tablet Take one tablet PO and may repeat in one hour if no improvement. Then take one tablet every 3 hours until pain is relieved. 30 tablet 0   COMIRNATY SUSP injection      fluticasone (FLONASE) 50 MCG/ACT nasal spray  Place 1 spray into both nostrils as needed for allergies or rhinitis.     hydrALAZINE (APRESOLINE) 25 MG tablet Take 25 mg by mouth See admin instructions. Take 25 mg daily may take a second 25 mg dose as needed for high blood pressure     lisinopril (PRINIVIL,ZESTRIL) 40 MG tablet Take 1 tablet (40 mg total) by mouth daily. 90 tablet 3   LUMIGAN 0.01 % SOLN Place 1 drop into the right eye at bedtime.     Menthol, Topical Analgesic, (ICY HOT EX) Apply 1 Application topically daily as needed (pain).     metFORMIN (GLUCOPHAGE) 500 MG tablet Take 500 mg by mouth 2 (two) times daily with a meal.      metoprolol tartrate (LOPRESSOR) 50 MG tablet Take 1 tablet (50 mg total) by mouth 2 (two) times daily. 180 tablet 3   nitroGLYCERIN (NITROSTAT) 0.4 MG SL tablet Place 1 tablet (0.4 mg total) under the tongue every 5 (five) minutes x 3 doses as needed for chest pain (If no relief after 3rd dose, proceed to the ED or call 911). 25 tablet 3   tiZANidine (ZANAFLEX) 4 MG tablet Take by mouth as needed.     vitamin B-12  (CYANOCOBALAMIN) 500 MCG tablet Take 500 mcg by mouth daily.     No current facility-administered medications for this visit.     Past Surgical History:  Procedure Laterality Date   ABDOMINAL HYSTERECTOMY     BACK SURGERY  2009/2010/2011   BREAST BIOPSY  unknown   left   BREAST BIOPSY Left 02/05/2022   Times 2   BREAST LUMPECTOMY WITH RADIOACTIVE SEED LOCALIZATION Left 03/03/2019   Procedure: LEFT BREAST LUMPECTOMY WITH RADIOACTIVE SEED LOCALIZATION;  Surgeon: Emelia Loron, MD;  Location: [facility];  Service: General;  Laterality: Left;   BREAST LUMPECTOMY WITH RADIOACTIVE SEED LOCALIZATION Left 03/14/2022   Procedure: LEFT BREAST SEED GUIDED LUMPECTOMY;  Surgeon: Emelia Loron, MD;  Location: [facility] OR;  Service: General;  Laterality: Left;   CARDIAC CATHETERIZATION  08/27/2006   NON-STEMI/TAXUS STENTING 100% PROXIMAL LAD (X2) JANUARY 2008   CARDIAC CATHETERIZATION  08/27/2010   occluded LAD stents, Om1 : 70%, RCA: 80%, Ef 40-45%   CARPAL TUNNEL RELEASE     bilateral   CATARACT EXTRACTION W/PHACO Right 10/11/2017   Procedure: CATARACT EXTRACTION PHACO AND INTRAOCULAR LENS PLACEMENT RIGHT EYE;  Surgeon: Fabio Pierce, MD;  Location: [facility];  Service: Ophthalmology;  Laterality: Right;  CDE: 5.17   CATARACT EXTRACTION W/PHACO Left 12/27/2017   Procedure: CATARACT EXTRACTION PHACO AND INTRAOCULAR LENS PLACEMENT (IOC);  Surgeon: Fabio Pierce, MD;  Location: [facility];  Service: Ophthalmology;  Laterality: Left;  CDE: 2.99   CHOLECYSTECTOMY  not sure   COLONOSCOPY     CORONARY ARTERY BYPASS GRAFT  09/05/2011   Procedure: CORONARY ARTERY BYPASS GRAFTING (CABG);  Surgeon: Delight Ovens, MD;  Location: [facility] OR;  Service: Open Heart Surgery;  Laterality: N/A;  CABG times three using left internal mammary artery and left leg greater saphenous vein harvested endoscopically   LEG TENDON SURGERY  unknown   left    LUMBAR LAMINECTOMY/DECOMPRESSION MICRODISCECTOMY Left  06/04/2014   Procedure: LUMBAR LAMINECTOMY/DECOMPRESSION MICRODISCECTOMY LUMBAR FOUR-FIVE;  Surgeon: Mariam Dollar, MD;  Location: [facility];  Service: Neurosurgery;  Laterality: Left;   MASS EXCISION Left 08/08/2018   Procedure: EXCISION 5CM LIPOMA ON BACK;  Surgeon: Lucretia Roers, MD;  Location: [facility];  Service: General;  Laterality: Left;   TONSILLECTOMY  No Known Allergies    Family History  Problem Relation Age of Onset   Hypertension Father    Heart attack Sister 54   Heart attack Brother 74   Dementia Mother    Lupus Brother    Heart attack Brother    Hypertension Brother    Anesthesia problems Neg Hx    Hypotension Neg Hx    Malignant hyperthermia Neg Hx    Pseudochol deficiency Neg Hx      Social History Ms. Beth reports that she has never smoked. She has never used smokeless tobacco. Ms. Heiberg reports no history of alcohol use.   Review of Systems CONSTITUTIONAL: No weight loss, fever, chills, weakness or fatigue.  HEENT: Eyes: No visual loss, blurred vision, double vision or yellow sclerae.No hearing loss, sneezing, congestion, runny nose or sore throat.  SKIN: No rash or itching.  CARDIOVASCULAR: per hpi RESPIRATORY: No shortness of breath, cough or sputum.  GASTROINTESTINAL: No anorexia, nausea, vomiting or diarrhea. No abdominal pain or blood.  GENITOURINARY: No burning on urination, no polyuria NEUROLOGICAL: No headache, dizziness, syncope, paralysis, ataxia, numbness or tingling in the extremities. No change in bowel or bladder control.  MUSCULOSKELETAL: No muscle, back pain, joint pain or stiffness.  LYMPHATICS: No enlarged nodes. No history of splenectomy.  PSYCHIATRIC: No history of depression or anxiety.  ENDOCRINOLOGIC: No reports of sweating, cold or heat intolerance. No polyuria or polydipsia.  [name]   Physical Examination Today's Vitals   08/05/23 1300  BP: 128/62  Pulse: 73  SpO2: 98%  Weight: 196 lb 12.8 oz (89.3 kg)   Height: 5\' 3"  (1.6 m)   Body mass index is 34.86 kg/m.  Gen: resting comfortably, no acute distress HEENT: no scleral icterus, pupils equal round and reactive, no palptable cervical adenopathy,  CV: RRR, no m/rg, no jvd Resp: Clear to auscultation bilaterally GI: abdomen is soft, non-tender, non-distended, normal bowel sounds, no hepatosplenomegaly MSK: extremities are warm, no edema.  Skin: warm, no rash Neuro:  no focal deficits Psych: appropriate affect     Assessment and Plan  1. CAD   - denies any symptoms, continue current meds   2. HTN   - bp is at goal, continue current meds  3. Hyperlipidemia   - has been at goal, upcoming labs with pcp next monthy - continue current meds      Antoine Poche, M.D.

## 2023-08-05 NOTE — Patient Instructions (Signed)
Medication Instructions:  Continue all current medications.   Labwork: none  Testing/Procedures: none  Follow-Up: 6 months   Any Other Special Instructions Will Be Listed Below (If Applicable).   If you need a refill on your cardiac medications before your next appointment, please call your pharmacy.  

## 2023-08-26 ENCOUNTER — Inpatient Hospital Stay: Payer: Medicare HMO | Attending: Hematology

## 2023-08-26 ENCOUNTER — Ambulatory Visit (HOSPITAL_COMMUNITY)
Admission: RE | Admit: 2023-08-26 | Discharge: 2023-08-26 | Disposition: A | Discharge status: Home / Self Care | Payer: Medicare HMO | Source: Ambulatory Visit | Attending: Hematology | Admitting: Hematology

## 2023-08-26 DIAGNOSIS — C50312 Malignant neoplasm of lower-inner quadrant of left female breast: Secondary | ICD-10-CM | POA: Insufficient documentation

## 2023-08-26 DIAGNOSIS — Z13820 Encounter for screening for osteoporosis: Secondary | ICD-10-CM | POA: Diagnosis not present

## 2023-08-26 DIAGNOSIS — Z170 Estrogen receptor positive status [ER+]: Secondary | ICD-10-CM | POA: Diagnosis not present

## 2023-08-26 DIAGNOSIS — Z923 Personal history of irradiation: Secondary | ICD-10-CM | POA: Diagnosis not present

## 2023-08-26 DIAGNOSIS — Z79811 Long term (current) use of aromatase inhibitors: Secondary | ICD-10-CM | POA: Insufficient documentation

## 2023-08-26 DIAGNOSIS — E559 Vitamin D deficiency, unspecified: Secondary | ICD-10-CM | POA: Diagnosis not present

## 2023-08-26 DIAGNOSIS — Z780 Asymptomatic menopausal state: Secondary | ICD-10-CM | POA: Insufficient documentation

## 2023-08-26 LAB — CBC WITH DIFFERENTIAL/PLATELET
Abs Immature Granulocytes: 0.04 10*3/uL (ref 0.00–0.07)
Basophils Absolute: 0.1 10*3/uL (ref 0.0–0.1)
Basophils Relative: 1 %
Eosinophils Absolute: 0.2 10*3/uL (ref 0.0–0.5)
Eosinophils Relative: 2 %
HCT: 40 % (ref 36.0–46.0)
Hemoglobin: 12.5 g/dL (ref 12.0–15.0)
Immature Granulocytes: 0 %
Lymphocytes Relative: 34 %
Lymphs Abs: 3.1 10*3/uL (ref 0.7–4.0)
MCH: 28.8 pg (ref 26.0–34.0)
MCHC: 31.3 g/dL (ref 30.0–36.0)
MCV: 92.2 fL (ref 80.0–100.0)
Monocytes Absolute: 0.7 10*3/uL (ref 0.1–1.0)
Monocytes Relative: 8 %
Neutro Abs: 5.1 10*3/uL (ref 1.7–7.7)
Neutrophils Relative %: 55 %
Platelets: 248 10*3/uL (ref 150–400)
RBC: 4.34 MIL/uL (ref 3.87–5.11)
RDW: 16.1 % — ABNORMAL HIGH (ref 11.5–15.5)
WBC: 9.2 10*3/uL (ref 4.0–10.5)
nRBC: 0 % (ref 0.0–0.2)

## 2023-08-26 LAB — COMPREHENSIVE METABOLIC PANEL
ALT: 26 U/L (ref 0–44)
AST: 22 U/L (ref 15–41)
Albumin: 3.9 g/dL (ref 3.5–5.0)
Alkaline Phosphatase: 44 U/L (ref 38–126)
Anion gap: 11 (ref 5–15)
BUN: 24 mg/dL — ABNORMAL HIGH (ref 8–23)
CO2: 23 mmol/L (ref 22–32)
Calcium: 9.1 mg/dL (ref 8.9–10.3)
Chloride: 105 mmol/L (ref 98–111)
Creatinine, Ser: 1.16 mg/dL — ABNORMAL HIGH (ref 0.44–1.00)
GFR, Estimated: 48 mL/min — ABNORMAL LOW (ref >60)
Glucose, Bld: 148 mg/dL — ABNORMAL HIGH (ref 70–99)
Potassium: 4 mmol/L (ref 3.5–5.1)
Sodium: 139 mmol/L (ref 135–145)
Total Bilirubin: 1.1 mg/dL (ref <1.2)
Total Protein: 7.3 g/dL (ref 6.5–8.1)

## 2023-08-26 LAB — VITAMIN D 25 HYDROXY (VIT D DEFICIENCY, FRACTURES): Vit D, 25-Hydroxy: 31.56 ng/mL (ref 30–100)

## 2023-09-02 ENCOUNTER — Inpatient Hospital Stay: Payer: Medicare HMO | Admitting: Hematology

## 2023-09-09 ENCOUNTER — Inpatient Hospital Stay: Payer: Medicare HMO | Admitting: Hematology

## 2023-09-10 DIAGNOSIS — M109 Gout, unspecified: Secondary | ICD-10-CM | POA: Diagnosis not present

## 2023-09-16 NOTE — Progress Notes (Signed)
[facility] [address], [place] [address]    Clinic Day:  09/17/2023  Referring physician: Juliette Alcide, MD  Patient Care Team: Juliette Alcide, MD as PCP - General Branch, Dorothe Pea, MD as PCP - Cardiology (Cardiology) Donalee Citrin, MD (Neurosurgery) Delight Ovens, MD (Inactive) (Cardiothoracic Surgery)   ASSESSMENT & PLAN:   Assessment: 1.  Stage Ia (PT1BPNX) left breast IDC: - Abnormal mammogram followed by left breast biopsy on 11/04/2018 showed small intraductal papilloma with usual ductal hyperplasia, no evidence of malignancy. - Left lumpectomy by Dr. Dwain Sarna on 03/03/2019, pathology showing 0.8 cm grade 1 invasive ductal carcinoma, arising in a complex sclerosing lesion, free margins.  ER-100%, PR-0%, HER-2 negative.  Ki-67 5%. - She was referred to Dr.Yanagihara.  No radiation therapy was recommended. -Anastrozole was started on 04/02/2019. 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  Negative for invasive carcinoma.  ALH present.  ER 40% moderate staining, PR more than 1% weak staining.  Second left breast biopsy was benign breast tissue. - Left breast lumpectomy (03/14/2022): Negative for invasive carcinoma.  Focal residual intermediate grade DCIS measuring 2.4 mm.  Focal ALH with ductal involvement.  Reexcision of the medial margin was negative.  Additional superior margin is also negative.  pTis PNX. - XRT completed in [place].   2.  Bone health: - DEXA scan on 03/17/2019 shows T score of -0.2 which is in the normal range.    Plan: 1.  Stage Ia left breast IDC: - She is tolerating anastrozole very well. - Physical exam: Left breast lumpectomy scar within normal limits with no palpable masses.  No palpable adenopathy. - Labs from 08/26/2023: Normal LFTs and CBC. - Continue anastrozole daily.  Will arrange for diagnostic mammogram end of April.  RTC 6 months for follow-up.   2.  Bone health: - Reviewed  bone density on 08/26/2023 results with T-score -0.4, normal limits. - Vitamin D level is 31.  Continue vitamin D supplements.   3.  CKD: - Creatinine is stable at 1.16.    Orders Placed This Encounter  Procedures   MM DIAG BREAST TOMO BILATERAL    Standing Status:   Future    Expected Date:   12/25/2023    Expiration Date:   09/16/2024    Reason for Exam (SYMPTOM  OR DIAGNOSIS REQUIRED):   breast cancer screening; recent hx breast cancer    Preferred imaging location?:   [facility]   CBC with Differential    Standing Status:   Future    Expected Date:   03/09/2024    Expiration Date:   09/16/2024   Comprehensive metabolic panel    Standing Status:   Future    Expected Date:   03/09/2024    Expiration Date:   09/16/2024       Mikeal Hawthorne R Teague,acting as a scribe for Doreatha Massed, MD.,have documented all relevant documentation on the behalf of Doreatha Massed, MD,as directed by  Doreatha Massed, MD while in the presence of Doreatha Massed, MD.    I, Doreatha Massed MD, have reviewed the above documentation for accuracy and completeness, and I agree with the above.   Doreatha Massed, MD   1/21/202510:47 AM  CHIEF COMPLAINT:   Diagnosis: left breast cancer    Cancer Staging  Malignant neoplasm of lower-inner quadrant of left breast in female, estrogen receptor positive (HCC) Staging form: Breast, AJCC 8th Edition - Pathologic stage from 03/03/2019: Stage  IA (pT1b, pN0, cM0, G1, ER+, PR-, HER2-) - Signed by Loa Socks, NP on 03/11/2019    Prior Therapy: 1. Left lumpectomy on 03/03/2019 (Dr. Dwain Sarna) 2. Left lumpectomy on 03/14/22 3. XRT to left breast completed in [place]  Current Therapy:  anastrozole, started 04/02/2019    HISTORY OF PRESENT ILLNESS:   Oncology History  Malignant neoplasm of lower-inner quadrant of left breast in female, estrogen receptor positive (HCC)  03/03/2019 Cancer Staging   Staging form: Breast, AJCC  8th Edition - Pathologic stage from 03/03/2019: Stage IA (pT1b, pN0, cM0, G1, ER+, PR-, HER2-) - Signed by Loa Socks, NP on 03/11/2019   03/11/2019 Initial Diagnosis   Malignant neoplasm of lower-inner quadrant of left breast in female, estrogen receptor positive (HCC)      INTERVAL HISTORY:   [name] is a 78 y.o. female presenting to clinic today for follow up of left breast cancer. She was last seen by me on 01/03/23.  Since her last visit, she underwent repeat DEXA scan on 08/26/23 showing a T-score of -0.4, which is considered normal. This is slightly worse from prior, which was -0.2 in 04/2021.  Today, she states that she is doing well overall. Her appetite level is at 75%. Her energy level is at 100%. She denies any recent medical issues. She takes anastrozole as prescribed and denies any side effects. She takes Vitamin D daily.   PAST MEDICAL HISTORY:   Past 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; left breast DCIS 02/05/22   Carotid artery occlusion    Chronic kidney disease    mild, stage 1   DM (diabetes mellitus) (HCC)    takes Metformin 1000mg  BID   Dyslipidemia    takes Pravastatin daily   Early cataracts, bilateral    Gout    takes Allopurinol daily   Herniated disc    left   Lumbar herniated disc    Myocardial infarction [facility]) 2008   pt has 2 stents   Nocturia    Obesity    Unspecified essential hypertension    takes Metoprolol,Lisinopril,and Amlodipine   Urinary frequency     Surgical History: Past Surgical History:  Procedure Laterality Date    ABDOMINAL HYSTERECTOMY     BACK SURGERY  2009/2010/2011   BREAST BIOPSY  unknown   left   BREAST BIOPSY Left 02/05/2022   Times 2   BREAST LUMPECTOMY WITH RADIOACTIVE SEED LOCALIZATION Left 03/03/2019   Procedure: LEFT BREAST LUMPECTOMY WITH RADIOACTIVE SEED LOCALIZATION;  Surgeon: Emelia Loron, MD;  Location: [facility];  Service: General;  Laterality: Left;   BREAST LUMPECTOMY WITH RADIOACTIVE SEED LOCALIZATION Left 03/14/2022   Procedure: LEFT BREAST SEED GUIDED LUMPECTOMY;  Surgeon: Emelia Loron, MD;  Location[facility] OR;  Service: General;  Laterality: Left;   CARDIAC CATHETERIZATION  08/27/2006   NON-STEMI/TAXUS STENTING 100% PROXIMAL LAD (X2) JANUARY 2008   CARDIAC CATHETERIZATION  08/27/2010   occluded LAD stents, Om1 : 70%, RCA: 80%, Ef 40-45%   CARPAL TUNNEL RELEASE  bilateral   CATARACT EXTRACTION W/PHACO Right 10/11/2017   Procedure: CATARACT EXTRACTION PHACO AND INTRAOCULAR LENS PLACEMENT RIGHT EYE;  Surgeon: Fabio Pierce, MD;  Location: [facility];  Service: Ophthalmology;  Laterality: Right;  CDE: 5.17   CATARACT EXTRACTION W/PHACO Left 12/27/2017   Procedure: CATARACT EXTRACTION PHACO AND INTRAOCULAR LENS PLACEMENT (IOC);  Surgeon: Fabio Pierce, MD;  Location: [facility];  Service: Ophthalmology;  Laterality: Left;  CDE: 2.99   CHOLECYSTECTOMY  not sure   COLONOSCOPY     CORONARY ARTERY BYPASS GRAFT  09/05/2011   Procedure: CORONARY ARTERY BYPASS GRAFTING (CABG);  Surgeon: Delight Ovens, MD;  Location: [facility] OR;  Service: Open Heart Surgery;  Laterality: N/A;  CABG times three using left internal mammary artery and left leg greater saphenous vein harvested endoscopically   LEG TENDON SURGERY  unknown   left    LUMBAR LAMINECTOMY/DECOMPRESSION MICRODISCECTOMY Left 06/04/2014   Procedure: LUMBAR LAMINECTOMY/DECOMPRESSION MICRODISCECTOMY LUMBAR FOUR-FIVE;  Surgeon: Mariam Dollar, MD;  Location: [facility];  Service: Neurosurgery;  Laterality: Left;    MASS EXCISION Left 08/08/2018   Procedure: EXCISION 5CM LIPOMA ON BACK;  Surgeon: Lucretia Roers, MD;  Location: [facility];  Service: General;  Laterality: Left;   TONSILLECTOMY      Social History: Social History   Socioeconomic History   Marital status: Widowed    Spouse name: Not on file   Number of children: 2   Years of education: Not on file   Highest education level: Not on file  Occupational History   Not on file  Tobacco Use   Smoking status: Never   Smokeless tobacco: Never  Vaping Use   Vaping status: Never Used  Substance and Sexual Activity   Alcohol use: No    Alcohol/week: 0.0 standard drinks of alcohol   Drug use: No   Sexual activity: Not Currently    Birth control/protection: Surgical  Other Topics Concern   Not on file  Social History Narrative   Not on file   Social Drivers of Health   Financial Resource Strain: Low Risk  (07/25/2020)   Overall Financial Resource Strain (CARDIA)    Difficulty of Paying Living Expenses: Not hard at all  Food Insecurity: No Food Insecurity (05/08/2023)   Hunger Vital Sign    Worried About Running Out of Food in the Last Year: Never true    Ran Out of Food in the Last Year: Never true  Transportation Needs: No Transportation Needs (05/08/2023)   PRAPARE - Administrator, Civil Service (Medical): No    Lack of Transportation (Non-Medical): No  Physical Activity: Insufficiently Active (07/25/2020)   Exercise Vital Sign    Days of Exercise per Week: 1 day    Minutes of Exercise per Session: 30 min  Stress: No Stress Concern Present (07/25/2020)   [employer] of Occupational Health - Occupational Stress Questionnaire    Feeling of Stress : Not at all  Social Connections: Moderately Isolated (07/25/2020)   Social Connection and Isolation Panel [NHANES]    Frequency of Communication with Friends and Family: More than three times a week    Frequency of Social Gatherings with Friends and Family: Three  times a week    Attends Religious Services: More than 4 times per year    Active Member of Clubs or Organizations: No    Attends Banker Meetings: Never    Marital Status: Widowed  Intimate Partner Violence: Not At Risk (05/08/2023)   Humiliation,  Afraid, Rape, and Kick questionnaire    Fear of Current or Ex-Partner: No    Emotionally Abused: No    Physically Abused: No    Sexually Abused: No    Family History: Family History  Problem Relation Age of Onset   Hypertension Father    Heart attack Sister 5   Heart attack Brother 62   Dementia Mother    Lupus Brother    Heart attack Brother    Hypertension Brother    Anesthesia problems Neg Hx    Hypotension Neg Hx    Malignant hyperthermia Neg Hx    Pseudochol deficiency Neg Hx     Current Medications:  Current Outpatient Medications:    acetaminophen (TYLENOL) 500 MG tablet, Take 500 mg by mouth 2 (two) times daily as needed for moderate pain or headache. , Disp: , Rfl:    allopurinol (ZYLOPRIM) 100 MG tablet, Take 100 mg by mouth at bedtime., Disp: , Rfl:    amLODipine (NORVASC) 10 MG tablet, TAKE 1 TABLET EVERY DAY  *DOSE  INCREASE*, Disp: 90 tablet, Rfl: 3   anastrozole (ARIMIDEX) 1 MG tablet, TAKE 1 TABLET BY MOUTH EVERY DAY, Disp: 90 tablet, Rfl: 2   Ascorbic Acid (VITAMIN C) 1000 MG tablet, Take 1,000 mg by mouth daily., Disp: , Rfl:    aspirin 81 MG EC tablet, Take 81 mg by mouth at bedtime., Disp: , Rfl:    atorvastatin (LIPITOR) 40 MG tablet, Take 1 tablet by mouth daily., Disp: , Rfl:    cephALEXin (KEFLEX) 500 MG capsule, Take 1 capsule (500 mg total) by mouth 3 (three) times daily., Disp: 21 capsule, Rfl: 0   Cholecalciferol (VITAMIN D) 50 MCG (2000 UT) tablet, Take 2,000 Units by mouth daily., Disp: , Rfl:    colchicine 0.6 MG tablet, Take one tablet PO and may repeat in one hour if no improvement. Then take one tablet every 3 hours until pain is relieved., Disp: 30 tablet, Rfl: 0   COMIRNATY SUSP  injection, , Disp: , Rfl:    fluticasone (FLONASE) 50 MCG/ACT nasal spray, Place 1 spray into both nostrils as needed for allergies or rhinitis., Disp: , Rfl:    lisinopril (PRINIVIL,ZESTRIL) 40 MG tablet, Take 1 tablet (40 mg total) by mouth daily., Disp: 90 tablet, Rfl: 3   LUMIGAN 0.01 % SOLN, Place 1 drop into the right eye at bedtime., Disp: , Rfl:    Menthol, Topical Analgesic, (ICY HOT EX), Apply 1 Application topically daily as needed (pain)., Disp: , Rfl:    metFORMIN (GLUCOPHAGE) 500 MG tablet, Take 500 mg by mouth 2 (two) times daily with a meal. , Disp: , Rfl:    metoprolol tartrate (LOPRESSOR) 50 MG tablet, Take 1 tablet (50 mg total) by mouth 2 (two) times daily., Disp: 180 tablet, Rfl: 3   tiZANidine (ZANAFLEX) 4 MG tablet, Take by mouth as needed., Disp: , Rfl:    vitamin B-12 (CYANOCOBALAMIN) 500 MCG tablet, Take 500 mcg by mouth daily., Disp: , Rfl:    hydrALAZINE (APRESOLINE) 25 MG tablet, Take 25 mg by mouth See admin instructions. Take 25 mg daily may take a second 25 mg dose as needed for high blood pressure, Disp: , Rfl:    nitroGLYCERIN (NITROSTAT) 0.4 MG SL tablet, Place 1 tablet (0.4 mg total) under the tongue every 5 (five) minutes x 3 doses as needed for chest pain (If no relief after 3rd dose, proceed to the ED or call 911). (Patient not taking: Reported on  09/17/2023), Disp: 25 tablet, Rfl: 3   Allergies: No Known Allergies  REVIEW OF SYSTEMS:   Review of Systems  Constitutional:  Negative for chills, fatigue and fever.  HENT:   Negative for lump/mass, mouth sores, nosebleeds, sore throat and trouble swallowing.   Eyes:  Negative for eye problems.  Respiratory:  Negative for cough and shortness of breath.   Cardiovascular:  Negative for chest pain, leg swelling and palpitations.  Gastrointestinal:  Negative for abdominal pain, constipation, diarrhea, nausea and vomiting.  Genitourinary:  Negative for bladder incontinence, difficulty urinating, dysuria, frequency,  hematuria and nocturia.   Musculoskeletal:  Negative for arthralgias, back pain, flank pain, myalgias and neck pain.  Skin:  Negative for itching and rash.  Neurological:  Positive for numbness. Negative for dizziness and headaches.  Hematological:  Does not bruise/bleed easily.  Psychiatric/Behavioral:  Negative for depression, sleep disturbance and suicidal ideas. The patient is not nervous/anxious.   All other systems reviewed and are negative.    VITALS:   Blood pressure (!) 145/60, pulse (!) 58, temperature 97.9 F (36.6 C), temperature source Oral, resp. rate 16, weight 196 lb 10.4 oz (89.2 kg), SpO2 100%.  Wt Readings from Last 3 Encounters:  09/17/23 196 lb 10.4 oz (89.2 kg)  08/05/23 196 lb 12.8 oz (89.3 kg)  04/20/23 205 lb (93 kg)    Body mass index is 34.84 kg/m.  Performance status (ECOG): 1 - Symptomatic but completely ambulatory  PHYSICAL EXAM:   Physical Exam Vitals and nursing note reviewed. Exam conducted with a chaperone present.  Constitutional:      Appearance: Normal appearance.  Cardiovascular:     Rate and Rhythm: Normal rate and regular rhythm.     Pulses: Normal pulses.     Heart sounds: Normal heart sounds.  Pulmonary:     Effort: Pulmonary effort is normal.     Breath sounds: Normal breath sounds.  Chest:     Comments: +Left breast lumpectomy scar in upper outer quadrant +no masses palpable in breasts bilaterally Abdominal:     Palpations: Abdomen is soft. There is no hepatomegaly, splenomegaly or mass.     Tenderness: There is no abdominal tenderness.  Musculoskeletal:     Right lower leg: No edema.     Left lower leg: No edema.  Lymphadenopathy:     Cervical: No cervical adenopathy.     Right cervical: No superficial, deep or posterior cervical adenopathy.    Left cervical: No superficial, deep or posterior cervical adenopathy.     Upper Body:     Right upper body: No supraclavicular or axillary adenopathy.     Left upper body: No  supraclavicular or axillary adenopathy.  Neurological:     General: No focal deficit present.     Mental Status: She is alert and oriented to person, place, and time.  Psychiatric:        Mood and Affect: Mood normal.        Behavior: Behavior normal.   Breast Exam Chaperone: Chapman Moss, RN  LABS:   CBC     Component Value Date/Time   WBC 9.2 08/26/2023 0911   RBC 4.34 08/26/2023 0911   HGB 12.5 08/26/2023 0911   HGB 13.0 09/19/2010 1152   HCT 40.0 08/26/2023 0911   HCT 39.3 09/19/2010 1152   PLT 248 08/26/2023 0911   PLT 276 09/19/2010 1152   MCV 92.2 08/26/2023 0911   MCV 89.2 09/19/2010 1152   MCH 28.8 08/26/2023 0911   MCHC  31.3 08/26/2023 0911   RDW 16.1 (H) 08/26/2023 0911   RDW 14.8 (H) 09/19/2010 1152   LYMPHSABS 3.1 08/26/2023 0911   LYMPHSABS 2.2 09/19/2010 1152   MONOABS 0.7 08/26/2023 0911   MONOABS 0.5 09/19/2010 1152   EOSABS 0.2 08/26/2023 0911   EOSABS 0.3 09/19/2010 1152   BASOSABS 0.1 08/26/2023 0911   BASOSABS 0.0 09/19/2010 1152    CMP      Component Value Date/Time   NA 139 08/26/2023 0911   NA 145 09/19/2010 1152   K 4.0 08/26/2023 0911   K 4.1 09/19/2010 1152   CL 105 08/26/2023 0911   CL 98 09/19/2010 1152   CO2 23 08/26/2023 0911   CO2 30 09/19/2010 1152   GLUCOSE 148 (H) 08/26/2023 0911   GLUCOSE 126 (H) 09/19/2010 1152   BUN 24 (H) 08/26/2023 0911   BUN 19 09/19/2010 1152   CREATININE 1.16 (H) 08/26/2023 0911   CREATININE 1.3 (H) 09/19/2010 1152   CALCIUM 9.1 08/26/2023 0911   CALCIUM 9.1 09/19/2010 1152   PROT 7.3 08/26/2023 0911   PROT 8.0 09/19/2010 1152   ALBUMIN 3.9 08/26/2023 0911   ALBUMIN 4.2 09/19/2010 1152   AST 22 08/26/2023 0911   AST 23 09/19/2010 1152   ALT 26 08/26/2023 0911   ALT 22 09/19/2010 1152   ALKPHOS 44 08/26/2023 0911   ALKPHOS 48 09/19/2010 1152   BILITOT 1.1 08/26/2023 0911   BILITOT 0.60 09/19/2010 1152   GFRNONAA 48 (L) 08/26/2023 0911   GFRAA 48 (L) 12/08/2019 1330     No  results found for: "CEA1", "CEA" / No results found for: "CEA1", "CEA" No results found for: "PSA1" No results found for: "ZHY865" No results found for: "CAN125"  No results found for: "TOTALPROTELP", "ALBUMINELP", "A1GS", "A2GS", "BETS", "BETA2SER", "GAMS", "MSPIKE", "SPEI" No results found for: "TIBC", "FERRITIN", "IRONPCTSAT" Lab Results  Component Value Date   LDH 136 09/19/2010   LDH 135 02/22/2010     STUDIES:   DG Bone Density Result Date: 08/26/2023 EXAM: DUAL X-RAY ABSORPTIOMETRY (DXA) FOR BONE MINERAL DENSITY IMPRESSION: Your patient Shakirra Rem completed a BMD test on 08/26/2023 using the [employer] DXA System (software version: 14.10) manufactured by [employer]. The following summarizes the results of our evaluation. Technologist: [facility] PATIENT BIOGRAPHICAL: Name: Kainoa, Heinkel Patient ID: [id number] Birth Date: February 27, 1946 Height: 63.0 in. Gender: Female Exam Date: 08/26/2023 Weight: 196.8 lbs. Indications: Height Loss, Post Menopausal, Hx Breast Ca, Back surgery, Bilateral Oophrectomy Fractures: Treatments: Asprin, Vitamin D DENSITOMETRY RESULTS: Site         Region     Measured Date Measured Age WHO Classification Young Adult T-score BMD         %Change vs. Previous Significant Change (*) DualFemur Neck Right 08/26/2023 78.7 Normal -0.4 0.986 g/cm2 -2.3% - DualFemur Neck Right 05/08/2021 76.4 Normal -0.2 1.009 g/cm2 -2.3% - DualFemur Neck Right 03/17/2019 74.2 Normal 0.0 1.033 g/cm2 - - DualFemur Total Mean 08/26/2023 78.7 Normal 1.2 1.154 g/cm2 -1.2% - DualFemur Total Mean 05/08/2021 76.4 Normal 1.3 1.168 g/cm2 -2.9% Yes DualFemur Total Mean 03/17/2019 74.2 Normal 1.6 1.203 g/cm2 - - Left Forearm Radius 33% 08/26/2023 78.7 Normal 0.0 0.705 g/cm2 -6.7% Yes Left Forearm Radius 33% 05/08/2021 76.4 Normal 0.7 0.756 g/cm2 -2.0% - Left Forearm Radius 33% 03/17/2019 74.2 Normal 0.9 0.771 g/cm2 - - ASSESSMENT: The BMD measured at Femur Neck Right is 0.986 g/cm2 with a  T-score of -0.4. This patient's diagnostic category is NORMAL according to World  Health Organization [facility]) criteria. The scan quality is good. Lumbar spine was excluded due to advanced degenerative changes and multiple surgeries. Compared with the prior study on 05/08/21. Since the prior study, there has been a SIGNIFICANT DECREASE in bone mineral density of the LEFT forearm (-6.7%). Since the prior study, there has been NO SIGNIFICANT CHANGE in bone mineral density of the hips. World Science writer [facility]) criteria for post-menopausal, Caucasian Women: Normal:       T-score at or above -1 SD Osteopenia:   T-score between -1 and -2.5 SD Osteoporosis: T-score at or below -2.5 SD RECOMMENDATIONS: 1. All patients should optimize calcium and vitamin D intake. 2. Consider FDA-approved medical therapies in postmenopausal women and med aged 64 years and older, based on the following: a. A hip or vertebral (clinical or morphometric) fracture b. T-score< -2.5 at the femoral neck or spine after appropriate evaluation to exclude secondary causes c. Low bone mass (T-score between -1.0 and -2.5 at the femoral neck or spine) and a 10-year probability of a hip fracture > 3% or a 10-year probability of a major osteoporosis-related fracture > 20% based on the [place]-adapted WHO algorithm d. Clinician judgment and/or patient preferences may indicate treatment for people with 10-year fracture probabilities above or below these levels FOLLOW-UP: Patients with diagnosis of osteoporosis or at high risk for fracture should have regular bone mineral density tests. For patients eligible for Medicare, routine testing is allowed once every 2 years. The testing frequency can be increased to one year for patients who have rapidly progressing disease, those who are receiving or discontinuing medical therapy to restore bone mass, or have additional risk factors. I have reviewed this report, and agree with the above findings. [facility] Electronically Signed   By: Harmon Pier M.D.   On: 08/26/2023 10:31

## 2023-09-17 ENCOUNTER — Inpatient Hospital Stay: Payer: Medicare HMO | Attending: Hematology | Admitting: Hematology

## 2023-09-17 ENCOUNTER — Other Ambulatory Visit (HOSPITAL_COMMUNITY): Payer: Self-pay | Admitting: Hematology

## 2023-09-17 VITALS — BP 145/60 | HR 58 | Temp 97.9°F | Resp 16 | Wt 196.7 lb

## 2023-09-17 DIAGNOSIS — Z79811 Long term (current) use of aromatase inhibitors: Secondary | ICD-10-CM | POA: Diagnosis not present

## 2023-09-17 DIAGNOSIS — Z9889 Other specified postprocedural states: Secondary | ICD-10-CM

## 2023-09-17 DIAGNOSIS — C50312 Malignant neoplasm of lower-inner quadrant of left female breast: Secondary | ICD-10-CM | POA: Insufficient documentation

## 2023-09-17 DIAGNOSIS — Z1722 Progesterone receptor negative status: Secondary | ICD-10-CM | POA: Insufficient documentation

## 2023-09-17 DIAGNOSIS — Z923 Personal history of irradiation: Secondary | ICD-10-CM | POA: Insufficient documentation

## 2023-09-17 DIAGNOSIS — Z170 Estrogen receptor positive status [ER+]: Secondary | ICD-10-CM | POA: Diagnosis not present

## 2023-09-17 DIAGNOSIS — Z1732 Human epidermal growth factor receptor 2 negative status: Secondary | ICD-10-CM | POA: Insufficient documentation

## 2023-09-17 NOTE — Progress Notes (Signed)
Patient is taking anastrozole as prescribed.  She has not missed any doses and reports no side effects at this time.

## 2023-09-17 NOTE — Patient Instructions (Addendum)
[facility] at [facility] Discharge Instructions   You were seen and examined today by Dr. Ellin Saba.  He reviewed the results of your lab work which are normal/stable.   Your bone density test was normal.   Continue anastrozole as prescribed.   We will schedule you for a mammogram on or after April 30th.   We will see you back in 6 months. We will repeat lab work at that time.   Return as scheduled.    Thank you for choosing [facility] at [facility] to provide your oncology and hematology care.  To afford each patient quality time with our provider, please arrive at least 15 minutes before your scheduled appointment time.   If you have a lab appointment with the Cancer Center please come in thru the Main Entrance and check in at the main information desk.  You need to re-schedule your appointment should you arrive 10 or more minutes late.  We strive to give you quality time with our providers, and arriving late affects you and other patients whose appointments are after yours.  Also, if you no show three or more times for appointments you may be dismissed from the clinic at the providers discretion.     Again, thank you for choosing [facility].  Our hope is that these requests will decrease the amount of time that you wait before being seen by our physicians.       _____________________________________________________________  Should you have questions after your visit to [facility], please contact our office at [phone] and follow the prompts.  Our office hours are 8:00 a.m. and 4:30 p.m. Monday - Friday.  Please note that voicemails left after 4:00 p.m. may not be returned until the following business day.  We are closed weekends and major holidays.  You do have access to a nurse 24-7, just call the main number to the clinic [phone] and do not press any options, hold on the line and a nurse will  answer the phone.    For prescription refill requests, have your pharmacy contact our office and allow 72 hours.    Due to Covid, you will need to wear a mask upon entering the hospital. If you do not have a mask, a mask will be given to you at the Main Entrance upon arrival. For doctor visits, patients may have 1 support person age 79 or older with them. For treatment visits, patients can not have anyone with them due to social distancing guidelines and our immunocompromised population.

## 2023-10-10 DIAGNOSIS — I70223 Atherosclerosis of native arteries of extremities with rest pain, bilateral legs: Secondary | ICD-10-CM | POA: Diagnosis not present

## 2023-10-10 DIAGNOSIS — R809 Proteinuria, unspecified: Secondary | ICD-10-CM | POA: Diagnosis not present

## 2023-10-10 DIAGNOSIS — I129 Hypertensive chronic kidney disease with stage 1 through stage 4 chronic kidney disease, or unspecified chronic kidney disease: Secondary | ICD-10-CM | POA: Diagnosis not present

## 2023-10-10 DIAGNOSIS — Z8249 Family history of ischemic heart disease and other diseases of the circulatory system: Secondary | ICD-10-CM | POA: Diagnosis not present

## 2023-10-10 DIAGNOSIS — E1129 Type 2 diabetes mellitus with other diabetic kidney complication: Secondary | ICD-10-CM | POA: Diagnosis not present

## 2023-10-10 DIAGNOSIS — E1151 Type 2 diabetes mellitus with diabetic peripheral angiopathy without gangrene: Secondary | ICD-10-CM | POA: Diagnosis not present

## 2023-10-10 DIAGNOSIS — Z833 Family history of diabetes mellitus: Secondary | ICD-10-CM | POA: Diagnosis not present

## 2023-10-10 DIAGNOSIS — C50919 Malignant neoplasm of unspecified site of unspecified female breast: Secondary | ICD-10-CM | POA: Diagnosis not present

## 2023-10-10 DIAGNOSIS — N1831 Chronic kidney disease, stage 3a: Secondary | ICD-10-CM | POA: Diagnosis not present

## 2023-10-10 DIAGNOSIS — E1122 Type 2 diabetes mellitus with diabetic chronic kidney disease: Secondary | ICD-10-CM | POA: Diagnosis not present

## 2023-10-10 DIAGNOSIS — I25119 Atherosclerotic heart disease of native coronary artery with unspecified angina pectoris: Secondary | ICD-10-CM | POA: Diagnosis not present

## 2023-10-10 DIAGNOSIS — I509 Heart failure, unspecified: Secondary | ICD-10-CM | POA: Diagnosis not present

## 2023-10-10 DIAGNOSIS — Z7984 Long term (current) use of oral hypoglycemic drugs: Secondary | ICD-10-CM | POA: Diagnosis not present

## 2023-10-10 DIAGNOSIS — I130 Hypertensive heart and chronic kidney disease with heart failure and stage 1 through stage 4 chronic kidney disease, or unspecified chronic kidney disease: Secondary | ICD-10-CM | POA: Diagnosis not present

## 2023-10-16 DIAGNOSIS — R809 Proteinuria, unspecified: Secondary | ICD-10-CM | POA: Diagnosis not present

## 2023-10-16 DIAGNOSIS — I129 Hypertensive chronic kidney disease with stage 1 through stage 4 chronic kidney disease, or unspecified chronic kidney disease: Secondary | ICD-10-CM | POA: Diagnosis not present

## 2023-10-16 DIAGNOSIS — E1129 Type 2 diabetes mellitus with other diabetic kidney complication: Secondary | ICD-10-CM | POA: Diagnosis not present

## 2023-10-16 DIAGNOSIS — E1122 Type 2 diabetes mellitus with diabetic chronic kidney disease: Secondary | ICD-10-CM | POA: Diagnosis not present

## 2023-10-23 ENCOUNTER — Other Ambulatory Visit: Payer: Self-pay | Admitting: Hematology

## 2023-10-23 DIAGNOSIS — C50312 Malignant neoplasm of lower-inner quadrant of left female breast: Secondary | ICD-10-CM

## 2023-11-20 DIAGNOSIS — H401211 Low-tension glaucoma, right eye, mild stage: Secondary | ICD-10-CM | POA: Diagnosis not present

## 2023-11-20 DIAGNOSIS — H5212 Myopia, left eye: Secondary | ICD-10-CM | POA: Diagnosis not present

## 2023-12-24 DIAGNOSIS — R059 Cough, unspecified: Secondary | ICD-10-CM | POA: Diagnosis not present

## 2023-12-24 DIAGNOSIS — Z6835 Body mass index (BMI) 35.0-35.9, adult: Secondary | ICD-10-CM | POA: Diagnosis not present

## 2023-12-24 DIAGNOSIS — J019 Acute sinusitis, unspecified: Secondary | ICD-10-CM | POA: Diagnosis not present

## 2023-12-31 ENCOUNTER — Encounter (HOSPITAL_COMMUNITY): Payer: Medicare HMO

## 2023-12-31 ENCOUNTER — Ambulatory Visit (HOSPITAL_COMMUNITY): Payer: Medicare HMO

## 2024-01-14 ENCOUNTER — Encounter (HOSPITAL_COMMUNITY): Payer: Self-pay

## 2024-01-14 ENCOUNTER — Ambulatory Visit (HOSPITAL_COMMUNITY)
Admission: RE | Admit: 2024-01-14 | Discharge: 2024-01-14 | Disposition: A | Discharge status: Home / Self Care | Source: Ambulatory Visit | Attending: Hematology | Admitting: Hematology

## 2024-01-14 DIAGNOSIS — R92323 Mammographic fibroglandular density, bilateral breasts: Secondary | ICD-10-CM | POA: Diagnosis not present

## 2024-01-14 DIAGNOSIS — Z170 Estrogen receptor positive status [ER+]: Secondary | ICD-10-CM | POA: Insufficient documentation

## 2024-01-14 DIAGNOSIS — C50312 Malignant neoplasm of lower-inner quadrant of left female breast: Secondary | ICD-10-CM | POA: Insufficient documentation

## 2024-01-14 DIAGNOSIS — Z9889 Other specified postprocedural states: Secondary | ICD-10-CM | POA: Insufficient documentation

## 2024-01-14 DIAGNOSIS — Z853 Personal history of malignant neoplasm of breast: Secondary | ICD-10-CM | POA: Diagnosis not present

## 2024-01-14 DIAGNOSIS — R928 Other abnormal and inconclusive findings on diagnostic imaging of breast: Secondary | ICD-10-CM | POA: Diagnosis not present

## 2024-01-14 HISTORY — DX: Personal history of irradiation: Z92.3

## 2024-01-30 DIAGNOSIS — N189 Chronic kidney disease, unspecified: Secondary | ICD-10-CM | POA: Diagnosis not present

## 2024-01-30 DIAGNOSIS — D631 Anemia in chronic kidney disease: Secondary | ICD-10-CM | POA: Diagnosis not present

## 2024-01-30 DIAGNOSIS — R809 Proteinuria, unspecified: Secondary | ICD-10-CM | POA: Diagnosis not present

## 2024-02-06 DIAGNOSIS — N1831 Chronic kidney disease, stage 3a: Secondary | ICD-10-CM | POA: Diagnosis not present

## 2024-02-06 DIAGNOSIS — E876 Hypokalemia: Secondary | ICD-10-CM | POA: Diagnosis not present

## 2024-02-06 DIAGNOSIS — Z6837 Body mass index (BMI) 37.0-37.9, adult: Secondary | ICD-10-CM | POA: Diagnosis not present

## 2024-02-06 DIAGNOSIS — E1129 Type 2 diabetes mellitus with other diabetic kidney complication: Secondary | ICD-10-CM | POA: Diagnosis not present

## 2024-02-11 ENCOUNTER — Ambulatory Visit: Payer: Medicare HMO | Admitting: Cardiology

## 2024-02-13 DIAGNOSIS — H401211 Low-tension glaucoma, right eye, mild stage: Secondary | ICD-10-CM | POA: Diagnosis not present

## 2024-02-14 ENCOUNTER — Encounter: Payer: Self-pay | Admitting: Cardiology

## 2024-02-14 ENCOUNTER — Ambulatory Visit: Attending: Cardiology | Admitting: Cardiology

## 2024-02-14 ENCOUNTER — Encounter: Payer: Self-pay | Admitting: *Deleted

## 2024-02-14 VITALS — BP 134/60 | HR 60 | Ht 62.0 in | Wt 200.8 lb

## 2024-02-14 DIAGNOSIS — I10 Essential (primary) hypertension: Secondary | ICD-10-CM | POA: Diagnosis not present

## 2024-02-14 DIAGNOSIS — E782 Mixed hyperlipidemia: Secondary | ICD-10-CM | POA: Diagnosis not present

## 2024-02-14 DIAGNOSIS — I2510 Atherosclerotic heart disease of native coronary artery without angina pectoris: Secondary | ICD-10-CM | POA: Diagnosis not present

## 2024-02-14 NOTE — Patient Instructions (Signed)
 Medication Instructions:  Continue all current medications.   Labwork: none  Testing/Procedures: none  Follow-Up: 6 months   Any Other Special Instructions Will Be Listed Below (If Applicable).   If you need a refill on your cardiac medications before your next appointment, please call your pharmacy.

## 2024-02-14 NOTE — Progress Notes (Signed)
 Clinical Summary [name] is a 78 y.o.female seen today for follow up of the following medical problems.      1. CAD   - CABG Jan 2013: 3 vessel (LIMA to LAD, SVG to OM, SVG to acute marginal).She had prior stenting as described below   - Jan 2013 echo LVEF 55-60%       - no chest pains, no SOB/DOE -compliant with meds     2. Hyperlipidemia   -upcoming labs with pcp - she is on atorvastain   Jan 2023 TC 127 TG 146 HDL 33 LDL 68 - she is on atorvastatin 40mg  daily.  - upcoming labs with pcp in Jan   3. HTN   - home bp's typicaly 130s/70s      4. PAD - followed by vascular. - carotid stenosis followed by vascular, LE arterial disease as well   5. 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; left breast DCIS 02/05/22   Carotid artery occlusion    Chronic kidney disease    mild, stage 1   DM (diabetes mellitus) (HCC)    takes Metformin  1000mg  BID   Dyslipidemia    takes Pravastatin  daily   Early cataracts, bilateral    Gout    takes Allopurinol  daily   Herniated disc    left   Lumbar herniated disc    Myocardial infarction [facility]) 2008   pt has 2 stents   Nocturia    Obesity    Personal history of radiation therapy    Unspecified essential hypertension    takes Metoprolol ,Lisinopril ,and Amlodipine    Urinary frequency      No Known Allergies   Current Outpatient Medications  Medication Sig Dispense Refill   acetaminophen  (TYLENOL ) 500 MG tablet Take 500 mg by mouth 2 (two) times daily as needed for  moderate pain or headache.      amLODipine  (NORVASC ) 10 MG tablet TAKE 1 TABLET EVERY DAY  *DOSE  INCREASE* 90 tablet 3   anastrozole  (ARIMIDEX ) 1 MG tablet TAKE 1 TABLET BY MOUTH EVERY DAY 90 tablet 2   Ascorbic Acid (VITAMIN C) 1000 MG tablet Take 1,000 mg by mouth daily.     aspirin  81 MG EC tablet Take 81 mg by mouth at bedtime.     atorvastatin (LIPITOR) 40 MG tablet Take 1 tablet by mouth daily.     chlorthalidone  (HYGROTON ) 25 MG tablet Take 12.5 mg by mouth every morning. (Patient taking differently: Take 12.5 mg by mouth every other day.)     Cholecalciferol (VITAMIN D ) 50 MCG (2000 UT) tablet Take 2,000 Units by mouth daily.     colchicine  0.6 MG tablet Take one tablet PO and may repeat in one hour if no improvement. Then take one tablet every 3 hours until pain is relieved. 30 tablet 0   COMIRNATY SUSP injection      fluticasone (FLONASE) 50 MCG/ACT nasal spray Place 1 spray into both nostrils as needed for  allergies or rhinitis.     hydrALAZINE (APRESOLINE) 25 MG tablet Take 25 mg by mouth See admin instructions. Take 25 mg daily may take a second 25 mg dose as needed for high blood pressure     lisinopril  (PRINIVIL ,ZESTRIL ) 40 MG tablet Take 1 tablet (40 mg total) by mouth daily. 90 tablet 3   LUMIGAN 0.01 % SOLN Place 1 drop into the right eye at bedtime.     Menthol , Topical Analgesic, (ICY HOT EX) Apply 1 Application topically daily as needed (pain).     metFORMIN  (GLUCOPHAGE ) 500 MG tablet Take 500 mg by mouth 2 (two) times daily with a meal.      metoprolol  tartrate (LOPRESSOR ) 50 MG tablet Take 1 tablet (50 mg total) by mouth 2 (two) times daily. 180 tablet 3   nitroGLYCERIN  (NITROSTAT ) 0.4 MG SL tablet Place 1 tablet (0.4 mg total) under the tongue every 5 (five) minutes x 3 doses as needed for chest pain (If no relief after 3rd dose, proceed to the ED or call 911). 25 tablet 3   potassium chloride  (KLOR-CON ) 10 MEQ tablet Take 10 mEq by mouth 3 (three) times a week.      tiZANidine (ZANAFLEX) 4 MG tablet Take by mouth as needed.     vitamin B-12 (CYANOCOBALAMIN) 500 MCG tablet Take 500 mcg by mouth daily.     allopurinol  (ZYLOPRIM ) 100 MG tablet Take 100 mg by mouth at bedtime. (Patient not taking: Reported on 02/14/2024)     cephALEXin  (KEFLEX ) 500 MG capsule Take 1 capsule (500 mg total) by mouth 3 (three) times daily. (Patient not taking: Reported on 02/14/2024) 21 capsule 0   No current facility-administered medications for this visit.     Past Surgical History:  Procedure Laterality Date   ABDOMINAL HYSTERECTOMY     BACK SURGERY  2009/2010/2011   BREAST BIOPSY  unknown   left   BREAST BIOPSY Left 02/05/2022   Times 2   BREAST LUMPECTOMY WITH RADIOACTIVE SEED LOCALIZATION Left 03/03/2019   Procedure: LEFT BREAST LUMPECTOMY WITH RADIOACTIVE SEED LOCALIZATION;  Surgeon: Enid Harry, MD;  Location: [facility];  Service: General;  Laterality: Left;   BREAST LUMPECTOMY WITH RADIOACTIVE SEED LOCALIZATION Left 03/14/2022   Procedure: LEFT BREAST SEED GUIDED LUMPECTOMY;  Surgeon: Enid Harry, MD;  Location: [facility] OR;  Service: General;  Laterality: Left;   CARDIAC CATHETERIZATION  08/27/2006   NON-STEMI/TAXUS STENTING 100% PROXIMAL LAD (X2) JANUARY 2008   CARDIAC CATHETERIZATION  08/27/2010   occluded LAD stents, Om1 : 70%, RCA: 80%, Ef 40-45%   CARPAL TUNNEL RELEASE     bilateral   CATARACT EXTRACTION W/PHACO Right 10/11/2017   Procedure: CATARACT EXTRACTION PHACO AND INTRAOCULAR LENS PLACEMENT RIGHT EYE;  Surgeon: Tarri Farm, MD;  Location: [facility];  Service: Ophthalmology;  Laterality: Right;  CDE: 5.17   CATARACT EXTRACTION W/PHACO Left 12/27/2017   Procedure: CATARACT EXTRACTION PHACO AND INTRAOCULAR LENS PLACEMENT (IOC);  Surgeon: Tarri Farm, MD;  Location: [facility];  Service: Ophthalmology;  Laterality: Left;  CDE: 2.99   CHOLECYSTECTOMY  not sure   COLONOSCOPY     CORONARY ARTERY BYPASS GRAFT  09/05/2011   Procedure:  CORONARY ARTERY BYPASS GRAFTING (CABG);  Surgeon: Norita Beauvais, MD;  Location: [facility] OR;  Service: Open Heart Surgery;  Laterality: N/A;  CABG times three using left internal mammary artery and left leg greater saphenous vein harvested endoscopically   LEG TENDON SURGERY  unknown   left    LUMBAR LAMINECTOMY/DECOMPRESSION  MICRODISCECTOMY Left 06/04/2014   Procedure: LUMBAR LAMINECTOMY/DECOMPRESSION MICRODISCECTOMY LUMBAR FOUR-FIVE;  Surgeon: Ferris Hua, MD;  Location: [facility];  Service: Neurosurgery;  Laterality: Left;   MASS EXCISION Left 08/08/2018   Procedure: EXCISION 5CM LIPOMA ON BACK;  Surgeon: Awilda Bogus, MD;  Location: [facility];  Service: General;  Laterality: Left;   TONSILLECTOMY       No Known Allergies    Family History  Problem Relation Age of Onset   Hypertension Father    Heart attack Sister 39   Heart attack Brother 17   Dementia Mother    Lupus Brother    Heart attack Brother    Hypertension Brother    Anesthesia problems Neg Hx    Hypotension Neg Hx    Malignant hyperthermia Neg Hx    Pseudochol deficiency Neg Hx      Social History Ms. Leugers reports that she has never smoked. She has never used smokeless tobacco. Ms. Sossamon reports no history of alcohol  use.     Physical Examination Today's Vitals   02/14/24 1333 02/14/24 1404  BP: (!) 152/70 134/60  Pulse: 60   SpO2: 95%   Weight: 200 lb 12.8 oz (91.1 kg)   Height: 5' 2 (1.575 m)    Body mass index is 36.73 kg/m.  Gen: resting comfortably, no acute distress HEENT: no scleral icterus, pupils equal round and reactive, no palptable cervical adenopathy,  CV: RRR, no m/rg, no jvd Resp: Clear to auscultation bilaterally GI: abdomen is soft, non-tender, non-distended, normal bowel sounds, no hepatosplenomegaly MSK: extremities are warm, no edema.  Skin: warm, no rash Neuro:  no focal deficits Psych: appropriate affect   Diagnostic Studies     Assessment and Plan   1.  CAD   - denies any significant symptoms, continue current meds   2. HTN   - bp at goal, continue current meds   3. Hyperlipidemia   - request pcp labs, continue current meds     Laurann Pollock, M.D.

## 2024-02-21 ENCOUNTER — Other Ambulatory Visit: Payer: Self-pay

## 2024-02-21 DIAGNOSIS — I6523 Occlusion and stenosis of bilateral carotid arteries: Secondary | ICD-10-CM

## 2024-02-21 DIAGNOSIS — I739 Peripheral vascular disease, unspecified: Secondary | ICD-10-CM

## 2024-03-16 ENCOUNTER — Inpatient Hospital Stay: Payer: Medicare HMO

## 2024-03-16 ENCOUNTER — Inpatient Hospital Stay: Payer: Medicare HMO | Attending: Hematology | Admitting: Hematology

## 2024-03-16 VITALS — BP 180/58 | HR 57 | Temp 97.9°F | Resp 16 | Wt 207.0 lb

## 2024-03-16 DIAGNOSIS — I252 Old myocardial infarction: Secondary | ICD-10-CM | POA: Insufficient documentation

## 2024-03-16 DIAGNOSIS — C50312 Malignant neoplasm of lower-inner quadrant of left female breast: Secondary | ICD-10-CM

## 2024-03-16 DIAGNOSIS — Z1722 Progesterone receptor negative status: Secondary | ICD-10-CM | POA: Insufficient documentation

## 2024-03-16 DIAGNOSIS — N189 Chronic kidney disease, unspecified: Secondary | ICD-10-CM | POA: Insufficient documentation

## 2024-03-16 DIAGNOSIS — D508 Other iron deficiency anemias: Secondary | ICD-10-CM

## 2024-03-16 DIAGNOSIS — R7989 Other specified abnormal findings of blood chemistry: Secondary | ICD-10-CM | POA: Insufficient documentation

## 2024-03-16 DIAGNOSIS — Z170 Estrogen receptor positive status [ER+]: Secondary | ICD-10-CM | POA: Insufficient documentation

## 2024-03-16 DIAGNOSIS — Z923 Personal history of irradiation: Secondary | ICD-10-CM | POA: Diagnosis not present

## 2024-03-16 DIAGNOSIS — Z79811 Long term (current) use of aromatase inhibitors: Secondary | ICD-10-CM | POA: Diagnosis not present

## 2024-03-16 DIAGNOSIS — Z1732 Human epidermal growth factor receptor 2 negative status: Secondary | ICD-10-CM | POA: Diagnosis not present

## 2024-03-16 LAB — CBC WITH DIFFERENTIAL/PLATELET
Abs Immature Granulocytes: 0.03 K/uL (ref 0.00–0.07)
Basophils Absolute: 0 K/uL (ref 0.0–0.1)
Basophils Relative: 0 %
Eosinophils Absolute: 0.2 K/uL (ref 0.0–0.5)
Eosinophils Relative: 3 %
HCT: 36.6 % (ref 36.0–46.0)
Hemoglobin: 11.8 g/dL — ABNORMAL LOW (ref 12.0–15.0)
Immature Granulocytes: 0 %
Lymphocytes Relative: 36 %
Lymphs Abs: 3 K/uL (ref 0.7–4.0)
MCH: 29.9 pg (ref 26.0–34.0)
MCHC: 32.2 g/dL (ref 30.0–36.0)
MCV: 92.7 fL (ref 80.0–100.0)
Monocytes Absolute: 0.7 K/uL (ref 0.1–1.0)
Monocytes Relative: 9 %
Neutro Abs: 4.2 K/uL (ref 1.7–7.7)
Neutrophils Relative %: 52 %
Platelets: 249 K/uL (ref 150–400)
RBC: 3.95 MIL/uL (ref 3.87–5.11)
RDW: 15.9 % — ABNORMAL HIGH (ref 11.5–15.5)
WBC: 8.1 K/uL (ref 4.0–10.5)
nRBC: 0 % (ref 0.0–0.2)

## 2024-03-16 LAB — COMPREHENSIVE METABOLIC PANEL WITH GFR
ALT: 16 U/L (ref 0–44)
AST: 20 U/L (ref 15–41)
Albumin: 3.8 g/dL (ref 3.5–5.0)
Alkaline Phosphatase: 39 U/L (ref 38–126)
Anion gap: 16 — ABNORMAL HIGH (ref 5–15)
BUN: 27 mg/dL — ABNORMAL HIGH (ref 8–23)
CO2: 20 mmol/L — ABNORMAL LOW (ref 22–32)
Calcium: 9 mg/dL (ref 8.9–10.3)
Chloride: 102 mmol/L (ref 98–111)
Creatinine, Ser: 1.62 mg/dL — ABNORMAL HIGH (ref 0.44–1.00)
GFR, Estimated: 32 mL/min — ABNORMAL LOW (ref >60)
Glucose, Bld: 174 mg/dL — ABNORMAL HIGH (ref 70–99)
Potassium: 3.9 mmol/L (ref 3.5–5.1)
Sodium: 138 mmol/L (ref 135–145)
Total Bilirubin: 1.4 mg/dL — ABNORMAL HIGH (ref 0.0–1.2)
Total Protein: 7.1 g/dL (ref 6.5–8.1)

## 2024-03-16 NOTE — Progress Notes (Signed)
 [facility] [address], [place] [address]    Clinic Day:  03/16/2024  Referring physician: Lari Elspeth BRAVO, MD  Patient Care Team: Lari Elspeth BRAVO, MD as PCP - General Branch, Dorn FALCON, MD as PCP - Cardiology (Cardiology) Onetha Kuba, MD (Neurosurgery) [employer] [place] NOVAK, MD (Inactive) (Cardiothoracic Surgery)   ASSESSMENT & PLAN:   Assessment: 1.  Stage Ia (PT1BPNX) left breast IDC: - Abnormal mammogram followed by left breast biopsy on 11/04/2018 showed small intraductal papilloma with usual ductal hyperplasia, no evidence of malignancy. - Left lumpectomy by Dr. Ebbie on 03/03/2019, pathology showing 0.8 cm grade 1 invasive ductal carcinoma, arising in a complex sclerosing lesion, free margins.  ER-100%, PR-0%, HER-2 negative.  Ki-67 5%. - She was referred to Dr.Yanagihara.  No radiation therapy was recommended. -Anastrozole  was started on 04/02/2019. 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  Negative for invasive carcinoma.  ALH present.  ER 40% moderate staining, PR more than 1% weak staining.  Second left breast biopsy was benign breast tissue. - Left breast lumpectomy (03/14/2022): Negative for invasive carcinoma.  Focal residual intermediate grade DCIS measuring 2.4 mm.  Focal ALH with ductal involvement.  Reexcision of the medial margin was negative.  Additional superior margin is also negative.  pTis PNX. - XRT completed in [place].   2.  Bone health: - DEXA scan on 03/17/2019 shows T score of -0.2 which is in the normal range.    Plan: 1.  Stage Ia left breast IDC: - Reviewed mammogram from 01/14/2024: BI-RADS Category 2. - She is tolerating anastrozole  very well. - Labs from 03/16/2024: Normal LFTs.  CBC grossly normal. - She has a very low risk disease.  She will complete 5 years of anastrozole  in August of this year.  She was told to stop taking anastrozole  after that.  We will schedule her for follow-up  in 1 year with repeat mammogram.   2.  Bone health: - Bone density on 08/26/2023 with T-score -0.4, normal limits. - Continue vitamin D  supplements.  Last vitamin D  was 31.   3.  CKD: - Creatinine has increased to 1.62 from 1.16 in December.  Continue follow-up with Dr. Rachele.    Orders Placed This Encounter  Procedures   MM 3D SCREENING MAMMOGRAM BILATERAL BREAST    Standing Status:   Future    Expected Date:   01/07/2025    Reason for Exam (SYMPTOM  OR DIAGNOSIS REQUIRED):   breast cancer screening    Preferred imaging location?:   [facility]   CBC with Differential    Standing Status:   Future    Expected Date:   03/08/2025    Expiration Date:   03/16/2025   Comprehensive metabolic panel    Standing Status:   Future    Expected Date:   03/08/2025    Expiration Date:   03/16/2025   Iron  and TIBC ([facility] ONLY)    Standing Status:   Future    Expected Date:   03/08/2025    Expiration Date:   03/16/2025   Ferritin    Standing Status:   Future    Expected Date:   03/08/2025    Expiration Date:   03/16/2025      LILLETTE Hummingbird R Teague,acting as a scribe for Alean Stands, MD.,have documented all relevant documentation on the behalf of Alean Stands, MD,as directed by  Alean Stands, MD while in the presence of Alean Stands, MD.  I, Alean Stands MD, have reviewed the above documentation for accuracy and completeness, and I agree with the above.    Alean Stands, MD   7/21/202512:29 PM  CHIEF COMPLAINT:   Diagnosis: left breast cancer    Cancer Staging  Malignant neoplasm of lower-inner quadrant of left breast in female, estrogen receptor positive (HCC) Staging form: Breast, AJCC 8th Edition - Pathologic stage from 03/03/2019: Stage IA (pT1b, pN0, cM0, G1, ER+, PR-, HER2-) - Signed by Crawford Morna Pickle, NP on 03/11/2019    Prior Therapy: 1. Left lumpectomy on 03/03/2019 (Dr. Ebbie) 2. Left lumpectomy on  03/14/22 3. XRT to left breast completed in [place]  Current Therapy:  anastrozole , started 04/02/2019    HISTORY OF PRESENT ILLNESS:   Oncology History  Malignant neoplasm of lower-inner quadrant of left breast in female, estrogen receptor positive (HCC)  03/03/2019 Cancer Staging   Staging form: Breast, AJCC 8th Edition - Pathologic stage from 03/03/2019: Stage IA (pT1b, pN0, cM0, G1, ER+, PR-, HER2-) - Signed by Crawford Morna Pickle, NP on 03/11/2019   03/11/2019 Initial Diagnosis   Malignant neoplasm of lower-inner quadrant of left breast in female, estrogen receptor positive (HCC)      INTERVAL HISTORY:   [name] is a 78 y.o. female presenting to clinic today for follow up of left breast cancer. She was last seen by me on 09/17/2023.  Since her last visit, she underwent diagnostic bilateral mammogram on 01/14/2024 that found: No evidence of breast malignancy.   Today, she states that she is doing well overall. Her appetite level is at 75%. Her energy level is at 50%.   PAST MEDICAL HISTORY:   Past 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; left breast DCIS 02/05/22   Carotid artery occlusion    Chronic kidney disease    mild, stage 1   DM (diabetes mellitus) (HCC)    takes Metformin  1000mg  BID   Dyslipidemia    takes Pravastatin  daily   Early cataracts, bilateral    Gout    takes Allopurinol  daily   Herniated disc    left   Lumbar herniated disc    Myocardial infarction [place]) 2008   pt has 2 stents   Nocturia    Obesity    Personal history of radiation therapy    Unspecified  essential hypertension    takes Metoprolol ,Lisinopril ,and Amlodipine    Urinary frequency     Surgical History: Past Surgical History:  Procedure Laterality Date   ABDOMINAL HYSTERECTOMY     BACK SURGERY  2009/2010/2011   BREAST BIOPSY  unknown   left   BREAST BIOPSY Left 02/05/2022   Times 2   BREAST LUMPECTOMY WITH RADIOACTIVE SEED LOCALIZATION Left 03/03/2019   Procedure: LEFT BREAST LUMPECTOMY WITH RADIOACTIVE SEED LOCALIZATION;  Surgeon: Ebbie Cough, MD;  Location: [facility];  Service: General;  Laterality: Left;   BREAST LUMPECTOMY WITH RADIOACTIVE SEED LOCALIZATION Left 03/14/2022   Procedure: LEFT BREAST SEED GUIDED LUMPECTOMY;  Surgeon: Ebbie Cough, MD;  Location: [facility] OR;  Service: General;  Laterality: Left;   CARDIAC CATHETERIZATION  08/27/2006   NON-STEMI/TAXUS STENTING 100% PROXIMAL LAD (X2) JANUARY 2008   CARDIAC CATHETERIZATION  08/27/2010   occluded LAD stents, Om1 : 70%, RCA: 80%, Ef 40-45%   CARPAL TUNNEL RELEASE     bilateral   CATARACT EXTRACTION W/PHACO Right 10/11/2017   Procedure: CATARACT EXTRACTION PHACO AND INTRAOCULAR LENS PLACEMENT RIGHT EYE;  Surgeon: Harrie Agent, MD;  Location: [facility];  Service: Ophthalmology;  Laterality: Right;  CDE: 5.17   CATARACT EXTRACTION W/PHACO Left 12/27/2017   Procedure: CATARACT EXTRACTION PHACO AND INTRAOCULAR LENS PLACEMENT (IOC);  Surgeon: Harrie Agent, MD;  Location: [facility];  Service: Ophthalmology;  Laterality: Left;  CDE: 2.99   CHOLECYSTECTOMY  not sure   COLONOSCOPY     CORONARY ARTERY BYPASS GRAFT  09/05/2011   Procedure: CORONARY ARTERY BYPASS GRAFTING (CABG);  Surgeon: Dallas KATHEE Jude, MD;  Location: [facility];  Service: Open Heart Surgery;  Laterality: N/A;  CABG times three using left internal mammary artery and left leg greater saphenous vein harvested endoscopically   LEG TENDON SURGERY  unknown   left    LUMBAR LAMINECTOMY/DECOMPRESSION MICRODISCECTOMY Left 06/04/2014    Procedure: LUMBAR LAMINECTOMY/DECOMPRESSION MICRODISCECTOMY LUMBAR FOUR-FIVE;  Surgeon: Arley SHAUNNA Helling, MD;  Location: [facility];  Service: Neurosurgery;  Laterality: Left;   MASS EXCISION Left 08/08/2018   Procedure: EXCISION 5CM LIPOMA ON BACK;  Surgeon: Kallie Manuelita BROCKS, MD;  Location: [facility];  Service: General;  Laterality: Left;   TONSILLECTOMY      Social History: Social History   Socioeconomic History   Marital status: Widowed    Spouse name: Not on file   Number of children: 2   Years of education: Not on file   Highest education level: Not on file  Occupational History   Not on file  Tobacco Use   Smoking status: Never   Smokeless tobacco: Never  Vaping Use   Vaping status: Never Used  Substance and Sexual Activity   Alcohol  use: No    Alcohol /week: 0.0 standard drinks of alcohol    Drug use: No   Sexual activity: Not Currently    Birth control/protection: Surgical  Other Topics Concern   Not on file  Social History Narrative   Not on file   Social Drivers of Health   Financial Resource Strain: Low Risk  (07/25/2020)   Overall Financial Resource Strain (CARDIA)    Difficulty of Paying Living Expenses: Not hard at all  Food Insecurity: No Food Insecurity (05/08/2023)   Hunger Vital Sign    Worried About Running Out of Food in the Last Year: Never true    Ran Out of Food in the Last Year: Never true  Transportation Needs: No Transportation Needs (05/08/2023)   PRAPARE - Administrator, Civil Service (Medical): No    Lack of Transportation (Non-Medical): No  Physical Activity: Insufficiently Active (07/25/2020)   Exercise Vital Sign    Days of Exercise per Week: 1 day    Minutes of Exercise per Session: 30 min  Stress: No Stress Concern Present (07/25/2020)   [employer] of Occupational Health - Occupational Stress Questionnaire    Feeling of Stress : Not at all  Social Connections: Moderately Isolated (07/25/2020)   Social Connection and  Isolation Panel    Frequency of Communication with Friends and Family: More than three times a week    Frequency of Social Gatherings with Friends and Family: Three times a week    Attends  Religious Services: More than 4 times per year    Active Member of Clubs or Organizations: No    Attends Banker Meetings: Never    Marital Status: Widowed  Intimate Partner Violence: Not At Risk (05/08/2023)   Humiliation, Afraid, Rape, and Kick questionnaire    Fear of Current or Ex-Partner: No    Emotionally Abused: No    Physically Abused: No    Sexually Abused: No    Family History: Family History  Problem Relation Age of Onset   Hypertension Father    Heart attack Sister 77   Heart attack Brother 89   Dementia Mother    Lupus Brother    Heart attack Brother    Hypertension Brother    Anesthesia problems Neg Hx    Hypotension Neg Hx    Malignant hyperthermia Neg Hx    Pseudochol deficiency Neg Hx     Current Medications:  Current Outpatient Medications:    acetaminophen  (TYLENOL ) 500 MG tablet, Take 500 mg by mouth 2 (two) times daily as needed for moderate pain or headache. , Disp: , Rfl:    allopurinol  (ZYLOPRIM ) 100 MG tablet, Take 100 mg by mouth at bedtime., Disp: , Rfl:    amLODipine  (NORVASC ) 10 MG tablet, TAKE 1 TABLET EVERY DAY  *DOSE  INCREASE*, Disp: 90 tablet, Rfl: 3   anastrozole  (ARIMIDEX ) 1 MG tablet, TAKE 1 TABLET BY MOUTH EVERY DAY, Disp: 90 tablet, Rfl: 2   Ascorbic Acid (VITAMIN C) 1000 MG tablet, Take 1,000 mg by mouth daily., Disp: , Rfl:    aspirin  81 MG EC tablet, Take 81 mg by mouth at bedtime., Disp: , Rfl:    atorvastatin (LIPITOR) 40 MG tablet, Take 1 tablet by mouth daily., Disp: , Rfl:    chlorthalidone  (HYGROTON ) 25 MG tablet, Take 12.5 mg by mouth every morning. (Patient taking differently: Take 12.5 mg by mouth every other day.), Disp: , Rfl:    Cholecalciferol (VITAMIN D ) 50 MCG (2000 UT) tablet, Take 2,000 Units by mouth daily., Disp: ,  Rfl:    colchicine  0.6 MG tablet, Take one tablet PO and may repeat in one hour if no improvement. Then take one tablet every 3 hours until pain is relieved., Disp: 30 tablet, Rfl: 0   COMIRNATY SUSP injection, , Disp: , Rfl:    fluticasone (FLONASE) 50 MCG/ACT nasal spray, Place 1 spray into both nostrils as needed for allergies or rhinitis., Disp: , Rfl:    hydrALAZINE (APRESOLINE) 25 MG tablet, Take 25 mg by mouth See admin instructions. Take 25 mg daily may take a second 25 mg dose as needed for high blood pressure, Disp: , Rfl:    lisinopril  (PRINIVIL ,ZESTRIL ) 40 MG tablet, Take 1 tablet (40 mg total) by mouth daily., Disp: 90 tablet, Rfl: 3   LUMIGAN 0.01 % SOLN, Place 1 drop into the right eye at bedtime., Disp: , Rfl:    Menthol , Topical Analgesic, (ICY HOT EX), Apply 1 Application topically daily as needed (pain)., Disp: , Rfl:    metFORMIN  (GLUCOPHAGE ) 500 MG tablet, Take 500 mg by mouth 2 (two) times daily with a meal. , Disp: , Rfl:    metoprolol  tartrate (LOPRESSOR ) 50 MG tablet, Take 1 tablet (50 mg total) by mouth 2 (two) times daily., Disp: 180 tablet, Rfl: 3   nitroGLYCERIN  (NITROSTAT ) 0.4 MG SL tablet, Place 1 tablet (0.4 mg total) under the tongue every 5 (five) minutes x 3 doses as needed for chest pain (If no relief  after 3rd dose, proceed to the ED or call 911)., Disp: 25 tablet, Rfl: 3   potassium chloride  (KLOR-CON ) 10 MEQ tablet, Take 10 mEq by mouth 3 (three) times a week., Disp: , Rfl:    tiZANidine (ZANAFLEX) 4 MG tablet, Take by mouth as needed., Disp: , Rfl:    vitamin B-12 (CYANOCOBALAMIN) 500 MCG tablet, Take 500 mcg by mouth daily., Disp: , Rfl:    Allergies: No Known Allergies  REVIEW OF SYSTEMS:   Review of Systems  Constitutional:  Negative for chills, fatigue and fever.  HENT:   Negative for lump/mass, mouth sores, nosebleeds, sore throat and trouble swallowing.   Eyes:  Negative for eye problems.  Respiratory:  Negative for cough and shortness of breath.    Cardiovascular:  Negative for chest pain, leg swelling and palpitations.  Gastrointestinal:  Negative for abdominal pain, constipation, diarrhea, nausea and vomiting.  Genitourinary:  Negative for bladder incontinence, difficulty urinating, dysuria, frequency, hematuria and nocturia.   Musculoskeletal:  Negative for arthralgias, back pain, flank pain, myalgias and neck pain.  Skin:  Negative for itching and rash.  Neurological:  Negative for dizziness, headaches and numbness.  Hematological:  Does not bruise/bleed easily.  Psychiatric/Behavioral:  Negative for depression, sleep disturbance and suicidal ideas. The patient is not nervous/anxious.   All other systems reviewed and are negative.    VITALS:   Blood pressure (!) 180/58, pulse (!) 57, temperature 97.9 F (36.6 C), temperature source Oral, resp. rate 16, weight 207 lb 0.2 oz (93.9 kg), SpO2 100%.  Wt Readings from Last 3 Encounters:  03/16/24 207 lb 0.2 oz (93.9 kg)  02/14/24 200 lb 12.8 oz (91.1 kg)  09/17/23 196 lb 10.4 oz (89.2 kg)    Body mass index is 37.86 kg/m.  Performance status (ECOG): 1 - Symptomatic but completely ambulatory  PHYSICAL EXAM:   Physical Exam Vitals and nursing note reviewed. Exam conducted with a chaperone present.  Constitutional:      Appearance: Normal appearance.  Cardiovascular:     Rate and Rhythm: Normal rate and regular rhythm.     Pulses: Normal pulses.     Heart sounds: Normal heart sounds.  Pulmonary:     Effort: Pulmonary effort is normal.     Breath sounds: Normal breath sounds.  Abdominal:     Palpations: Abdomen is soft. There is no hepatomegaly, splenomegaly or mass.     Tenderness: There is no abdominal tenderness.  Musculoskeletal:     Right lower leg: No edema.     Left lower leg: No edema.  Lymphadenopathy:     Cervical: No cervical adenopathy.     Right cervical: No superficial, deep or posterior cervical adenopathy.    Left cervical: No superficial, deep or  posterior cervical adenopathy.     Upper Body:     Right upper body: No supraclavicular or axillary adenopathy.     Left upper body: No supraclavicular or axillary adenopathy.  Neurological:     General: No focal deficit present.     Mental Status: She is alert and oriented to person, place, and time.  Psychiatric:        Mood and Affect: Mood normal.        Behavior: Behavior normal.   Breast Exam Chaperone: Isaiah Piety, RN  LABS:   CBC     Component Value Date/Time   WBC 8.1 03/16/2024 0923   RBC 3.95 03/16/2024 0923   HGB 11.8 (L) 03/16/2024 0923   HGB 13.0 09/19/2010  1152   HCT 36.6 03/16/2024 0923   HCT 39.3 09/19/2010 1152   PLT 249 03/16/2024 0923   PLT 276 09/19/2010 1152   MCV 92.7 03/16/2024 0923   MCV 89.2 09/19/2010 1152   MCH 29.9 03/16/2024 0923   MCHC 32.2 03/16/2024 0923   RDW 15.9 (H) 03/16/2024 0923   RDW 14.8 (H) 09/19/2010 1152   LYMPHSABS 3.0 03/16/2024 0923   LYMPHSABS 2.2 09/19/2010 1152   MONOABS 0.7 03/16/2024 0923   MONOABS 0.5 09/19/2010 1152   EOSABS 0.2 03/16/2024 0923   EOSABS 0.3 09/19/2010 1152   BASOSABS 0.0 03/16/2024 0923   BASOSABS 0.0 09/19/2010 1152    CMP      Component Value Date/Time   NA 138 03/16/2024 0923   NA 145 09/19/2010 1152   K 3.9 03/16/2024 0923   K 4.1 09/19/2010 1152   CL 102 03/16/2024 0923   CL 98 09/19/2010 1152   CO2 20 (L) 03/16/2024 0923   CO2 30 09/19/2010 1152   GLUCOSE 174 (H) 03/16/2024 0923   GLUCOSE 126 (H) 09/19/2010 1152   BUN 27 (H) 03/16/2024 0923   BUN 19 09/19/2010 1152   CREATININE 1.62 (H) 03/16/2024 0923   CREATININE 1.3 (H) 09/19/2010 1152   CALCIUM  9.0 03/16/2024 0923   CALCIUM  9.1 09/19/2010 1152   PROT 7.1 03/16/2024 0923   PROT 8.0 09/19/2010 1152   ALBUMIN  3.8 03/16/2024 0923   ALBUMIN  4.2 09/19/2010 1152   AST 20 03/16/2024 0923   AST 23 09/19/2010 1152   ALT 16 03/16/2024 0923   ALT 22 09/19/2010 1152   ALKPHOS 39 03/16/2024 0923   ALKPHOS 48 09/19/2010 1152    BILITOT 1.4 (H) 03/16/2024 0923   BILITOT 0.60 09/19/2010 1152   GFRNONAA 32 (L) 03/16/2024 0923   GFRAA 48 (L) 12/08/2019 1330     No results found for: CEA1, CEA / No results found for: CEA1, CEA No results found for: PSA1 No results found for: CAN199 No results found for: CAN125  No results found for: TOTALPROTELP, ALBUMINELP, A1GS, A2GS, BETS, BETA2SER, GAMS, MSPIKE, SPEI No results found for: TIBC, FERRITIN, IRONPCTSAT Lab Results  Component Value Date   LDH 136 09/19/2010   LDH 135 02/22/2010     STUDIES:   No results found.

## 2024-03-16 NOTE — Progress Notes (Signed)
 [facility] [place]  ASSESSMENT / PLAN: BECKI MCCASKILL is a 78 y.o. female with:  # asymptomatic R (60-79%) > L (1-39%) carotid artery stenosis   # atherosclerosis of native arteries of bilateral lower extremities causing intermittent claudication.   Recommend:  Abstinence from all tobacco products. Blood glucose control with goal A1c < 7%. Blood pressure control with goal blood pressure < 130/80 mmHg. Lipid reduction therapy with goal LDL-C < 55 mg/dL. Aspirin  81mg  by mouth daily. Atorvastatin 40-80mg  PO QD (or other high intensity statin therapy).  Her ABI has deteriorated but her symptoms are fairly stable.  We will continue to monitor this closely.  I counseled the patient not limb-threatening ischemia and encouraged her to present should this develop.  CHIEF COMPLAINT: surveillance of PAD  HISTORY OF PRESENT ILLNESS: DAMEISHA TSCHIDA is a 78 y.o. female returns to care for follow-up of carotid artery disease and peripheral arterial disease.  The patient reports stable claudication symptoms.  She is able to take care of her independent activities of daily living without restriction.  She does not describe ischemic rest pain or ulceration.  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; left breast DCIS 02/05/22   Carotid artery occlusion    Chronic kidney disease    mild, stage 1   DM (diabetes mellitus) (HCC)    takes Metformin  1000mg  BID   Dyslipidemia    takes  Pravastatin  daily   Early cataracts, bilateral    Gout    takes Allopurinol  daily   Herniated disc    left   Lumbar herniated disc    Myocardial infarction [facility]) 2008   pt has 2 stents   Nocturia    Obesity    Personal history of radiation therapy    Unspecified essential hypertension    takes Metoprolol ,Lisinopril ,and Amlodipine    Urinary frequency     Past Surgical History:  Procedure Laterality Date   ABDOMINAL HYSTERECTOMY     BACK SURGERY  2009/2010/2011   BREAST BIOPSY  unknown   left   BREAST BIOPSY Left 02/05/2022   Times 2   BREAST LUMPECTOMY WITH RADIOACTIVE SEED LOCALIZATION Left 03/03/2019   Procedure: LEFT BREAST LUMPECTOMY WITH RADIOACTIVE SEED LOCALIZATION;  Surgeon: Ebbie Cough, MD;  Location: [facility];  Service: General;  Laterality: Left;   BREAST LUMPECTOMY WITH RADIOACTIVE SEED LOCALIZATION Left 03/14/2022   Procedure: LEFT BREAST SEED GUIDED LUMPECTOMY;  Surgeon: Ebbie Cough, MD;  Location: [facility];  Service: General;  Laterality: Left;   CARDIAC CATHETERIZATION  08/27/2006   NON-STEMI/TAXUS STENTING 100% PROXIMAL LAD (X2) JANUARY 2008   CARDIAC CATHETERIZATION  08/27/2010   occluded LAD stents, Om1 : 70%, RCA: 80%, Ef 40-45%   CARPAL TUNNEL RELEASE     bilateral   CATARACT EXTRACTION W/PHACO Right 10/11/2017   Procedure: CATARACT EXTRACTION PHACO AND INTRAOCULAR LENS PLACEMENT RIGHT EYE;  Surgeon: Harrie Agent, MD;  Location:  [facility];  Service: Ophthalmology;  Laterality: Right;  CDE: 5.17   CATARACT EXTRACTION W/PHACO Left 12/27/2017   Procedure: CATARACT EXTRACTION PHACO AND INTRAOCULAR LENS PLACEMENT (IOC);  Surgeon: Harrie Agent, MD;  Location: [facility];  Service: Ophthalmology;  Laterality: Left;  CDE: 2.99   CHOLECYSTECTOMY  not sure   COLONOSCOPY     CORONARY ARTERY BYPASS GRAFT  09/05/2011   Procedure: CORONARY ARTERY BYPASS GRAFTING (CABG);  Surgeon: Dallas KATHEE Jude, MD;  Location: [facility] OR;  Service: Open Heart  Surgery;  Laterality: N/A;  CABG times three using left internal mammary artery and left leg greater saphenous vein harvested endoscopically   LEG TENDON SURGERY  unknown   left    LUMBAR LAMINECTOMY/DECOMPRESSION MICRODISCECTOMY Left 06/04/2014   Procedure: LUMBAR LAMINECTOMY/DECOMPRESSION MICRODISCECTOMY LUMBAR FOUR-FIVE;  Surgeon: Arley SHAUNNA Helling, MD;  Location: [facility];  Service: Neurosurgery;  Laterality: Left;   MASS EXCISION Left 08/08/2018   Procedure: EXCISION 5CM LIPOMA ON BACK;  Surgeon: Kallie Manuelita BROCKS, MD;  Location: [facility];  Service: General;  Laterality: Left;   TONSILLECTOMY      Family History  Problem Relation Age of Onset   Hypertension Father    Heart attack Sister 66   Heart attack Brother 40   Dementia Mother    Lupus Brother    Heart attack Brother    Hypertension Brother    Anesthesia problems Neg Hx    Hypotension Neg Hx    Malignant hyperthermia Neg Hx    Pseudochol deficiency Neg Hx     Social History   Socioeconomic History   Marital status: Widowed    Spouse name: Not on file   Number of children: 2   Years of education: Not on file   Highest education level: Not on file  Occupational History   Not on file  Tobacco Use   Smoking status: Never   Smokeless tobacco: Never  Vaping Use   Vaping status: Never Used  Substance and Sexual Activity   Alcohol  use: No    Alcohol /week: 0.0 standard drinks of alcohol    Drug use: No   Sexual activity: Not Currently    Birth control/protection: Surgical  Other Topics Concern   Not on file  Social History Narrative   Not on file   Social Drivers of Health   Financial Resource Strain: Low Risk  (07/25/2020)   Overall Financial Resource Strain (CARDIA)    Difficulty of Paying Living Expenses: Not hard at all  Food Insecurity: No Food Insecurity (05/08/2023)   Hunger Vital Sign    Worried About Running Out of Food in the Last Year: Never true    Ran Out of Food in the Last Year: Never true   Transportation Needs: No Transportation Needs (05/08/2023)   PRAPARE - Administrator, Civil Service (Medical): No    Lack of Transportation (Non-Medical): No  Physical Activity: Insufficiently Active (07/25/2020)   Exercise Vital Sign    Days of Exercise per Week: 1 day    Minutes of Exercise per Session: 30 min  Stress: No Stress Concern Present (07/25/2020)   [employer] of Occupational Health - Occupational Stress Questionnaire    Feeling of Stress : Not at all  Social Connections: Moderately Isolated (07/25/2020)   Social Connection and Isolation Panel    Frequency of Communication with Friends and Family: More than three times a week    Frequency of Social Gatherings with Friends and Family: Three times a week  Attends Religious Services: More than 4 times per year    Active Member of Clubs or Organizations: No    Attends Banker Meetings: Never    Marital Status: Widowed  Intimate Partner Violence: Not At Risk (05/08/2023)   Humiliation, Afraid, Rape, and Kick questionnaire    Fear of Current or Ex-Partner: No    Emotionally Abused: No    Physically Abused: No    Sexually Abused: No    No Known Allergies  Current Outpatient Medications  Medication Sig Dispense Refill   acetaminophen  (TYLENOL ) 500 MG tablet Take 500 mg by mouth 2 (two) times daily as needed for moderate pain or headache.      allopurinol  (ZYLOPRIM ) 100 MG tablet Take 100 mg by mouth at bedtime.     amLODipine  (NORVASC ) 10 MG tablet TAKE 1 TABLET EVERY DAY  *DOSE  INCREASE* 90 tablet 3   anastrozole  (ARIMIDEX ) 1 MG tablet TAKE 1 TABLET BY MOUTH EVERY DAY 90 tablet 2   Ascorbic Acid (VITAMIN C) 1000 MG tablet Take 1,000 mg by mouth daily.     aspirin  81 MG EC tablet Take 81 mg by mouth at bedtime.     atorvastatin (LIPITOR) 40 MG tablet Take 1 tablet by mouth daily.     chlorthalidone  (HYGROTON ) 25 MG tablet Take 12.5 mg by mouth every morning. (Patient taking differently:  Take 12.5 mg by mouth every other day.)     Cholecalciferol (VITAMIN D ) 50 MCG (2000 UT) tablet Take 2,000 Units by mouth daily.     colchicine  0.6 MG tablet Take one tablet PO and may repeat in one hour if no improvement. Then take one tablet every 3 hours until pain is relieved. 30 tablet 0   COMIRNATY SUSP injection      fluticasone (FLONASE) 50 MCG/ACT nasal spray Place 1 spray into both nostrils as needed for allergies or rhinitis.     hydrALAZINE (APRESOLINE) 25 MG tablet Take 25 mg by mouth See admin instructions. Take 25 mg daily may take a second 25 mg dose as needed for high blood pressure     lisinopril  (PRINIVIL ,ZESTRIL ) 40 MG tablet Take 1 tablet (40 mg total) by mouth daily. 90 tablet 3   LUMIGAN 0.01 % SOLN Place 1 drop into the right eye at bedtime.     Menthol , Topical Analgesic, (ICY HOT EX) Apply 1 Application topically daily as needed (pain).     metFORMIN  (GLUCOPHAGE ) 500 MG tablet Take 500 mg by mouth 2 (two) times daily with a meal.      metoprolol  tartrate (LOPRESSOR ) 50 MG tablet Take 1 tablet (50 mg total) by mouth 2 (two) times daily. 180 tablet 3   nitroGLYCERIN  (NITROSTAT ) 0.4 MG SL tablet Place 1 tablet (0.4 mg total) under the tongue every 5 (five) minutes x 3 doses as needed for chest pain (If no relief after 3rd dose, proceed to the ED or call 911). 25 tablet 3   potassium chloride  (KLOR-CON ) 10 MEQ tablet Take 10 mEq by mouth 3 (three) times a week.     tiZANidine (ZANAFLEX) 4 MG tablet Take by mouth as needed.     vitamin B-12 (CYANOCOBALAMIN) 500 MCG tablet Take 500 mcg by mouth daily.     No current facility-administered medications for this visit.    PHYSICAL EXAM Vitals:   03/17/24 1054  BP: (!) 155/75  Pulse: (!) 54  SpO2: 99%  Weight: 207 lb (93.9 kg)  Height: 5' 2 (1.575 m)   Elderly  woman in no distress Regular rate and rhythm Unlabored breathing No palpable pedal pulses    PERTINENT LABORATORY AND RADIOLOGIC DATA  Most recent CBC     Latest Ref Rng & Units 03/16/2024    9:23 AM 08/26/2023    9:11 AM 12/25/2022    9:22 AM  CBC  WBC 4.0 - 10.5 K/uL 8.1  9.2  7.6   Hemoglobin 12.0 - 15.0 g/dL 88.1  87.4  87.7   Hematocrit 36.0 - 46.0 % 36.6  40.0  38.3   Platelets 150 - 400 K/uL 249  248  271      Most recent CMP    Latest Ref Rng & Units 03/16/2024    9:23 AM 08/26/2023    9:11 AM 12/25/2022    9:22 AM  CMP  Glucose 70 - 99 mg/dL 825  851  848   BUN 8 - 23 mg/dL 27  24  23    Creatinine 0.44 - 1.00 mg/dL 8.37  8.83  8.84   Sodium 135 - 145 mmol/L 138  139  138   Potassium 3.5 - 5.1 mmol/L 3.9  4.0  3.4   Chloride 98 - 111 mmol/L 102  105  103   CO2 22 - 32 mmol/L 20  23  22    Calcium  8.9 - 10.3 mg/dL 9.0  9.1  9.4   Total Protein 6.5 - 8.1 g/dL 7.1  7.3  7.8   Total Bilirubin 0.0 - 1.2 mg/dL 1.4  1.1  1.1   Alkaline Phos 38 - 126 U/L 39  44  47   AST 15 - 41 U/L 20  22  23    ALT 0 - 44 U/L 16  26  19     LOWER EXTREMITY DOPPLER STUDY   Patient Name:  NELIAH CUYLER  Date of Exam:   03/17/2024  Medical Rec #: [id number]        Accession #:    [id number]  Date of Birth: [birth year]         Patient Gender: F  Patient Age:   36 years  Exam Location:  [address]  Procedure:      VAS US  ABI WITH/WO TBI  Referring Phys: DEBBY ROBERTSON    ---------------------------------------------------------------------------  -----    Indications: Claudication, and peripheral artery disease.   High Risk Factors: Hyperlipidemia, prior MI, coronary artery disease.     Performing Technologist: [place] RVT     Examination Guidelines: A complete evaluation includes at minimum, Doppler  waveform signals and systolic blood pressure reading at the level of  bilateral  brachial, anterior tibial, and posterior tibial arteries, when vessel  segments  are accessible. Bilateral testing is considered an integral part of a  complete  examination. Photoelectric Plethysmograph (PPG) waveforms and toe systolic  pressure  readings are included as required and additional duplex testing  as  needed. Limited examinations for reoccurring indications may be performed  as  noted.     ABI Findings:  +--------+------------------+-----+----------+--------+  Right  Rt Pressure (mmHg)IndexWaveform  Comment   +--------+------------------+-----+----------+--------+  Brachial170                                       +--------+------------------+-----+----------+--------+  ATA    144               0.85 monophasic          +--------+------------------+-----+----------+--------+  PTA    112               0.66 monophasic          +--------+------------------+-----+----------+--------+   +--------+------------------+-----+----------+-----------------------------  ----+  Left   Lt Pressure (mmHg)IndexWaveform  Comment                             +--------+------------------+-----+----------+-----------------------------  ----+  [id number]                                                                 +--------+------------------+-----+----------+-----------------------------  ----+  ATA                                     appears absent. May be due  to                                               swelling of the foot.               +--------+------------------+-----+----------+-----------------------------  ----+  PTA    80                0.47 monophasic                                    +--------+------------------+-----+----------+-----------------------------  ----+   +-------+-----------+-----------+------------+------------+  ABI/TBIToday's ABIToday's TBIPrevious ABIPrevious TBI  +-------+-----------+-----------+------------+------------+  Right 0.85                  0.8                       +-------+-----------+-----------+------------+------------+  Left  0.47                  0.73                       +-------+-----------+-----------+------------+------------+      Janifer Flo-lab not working. Exam performed with a portable Doppler  including ticker tape tracings of waveforms (to be scanned into the  report)  Previous ABI on 01/15/23.    Summary:  Right: Resting right ankle-brachial index indicates severe right lower  extremity arterial disease.   Left: Resting left ankle-brachial index indicates severe left lower  extremity arterial disease.    Debby SAILOR. Magda, MD FACS [facility] Phone Number: [phone] 03/17/2024 12:43 PM   Total time spent on preparing this encounter including chart review, data review, collecting history, examining the patient, and coordinating care: 30 minutes  Portions of this report may have been transcribed using voice recognition software.  Every effort has been made to ensure accuracy; however, inadvertent computerized transcription errors may still be present.

## 2024-03-16 NOTE — Patient Instructions (Addendum)
 [facility] at [facility] Discharge Instructions   You were seen and examined today by Dr. Rogers.  He reviewed the results of your lab work which are normal/stable.   Finish the supply of anastrozole  you have and then you may stop.   We will see you back in 1 year. We will repeat a mammogram in May.    Return as scheduled.    Thank you for choosing [facility] at [facility] to provide your oncology and hematology care.  To afford each patient quality time with our provider, please arrive at least 15 minutes before your scheduled appointment time.   If you have a lab appointment with the Cancer Center please come in thru the Main Entrance and check in at the main information desk.  You need to re-schedule your appointment should you arrive 10 or more minutes late.  We strive to give you quality time with our providers, and arriving late affects you and other patients whose appointments are after yours.  Also, if you no show three or more times for appointments you may be dismissed from the clinic at the providers discretion.     Again, thank you for choosing [facility].  Our hope is that these requests will decrease the amount of time that you wait before being seen by our physicians.       _____________________________________________________________  Should you have questions after your visit to [facility], please contact our office at [phone] and follow the prompts.  Our office hours are 8:00 a.m. and 4:30 p.m. Monday - Friday.  Please note that voicemails left after 4:00 p.m. may not be returned until the following business day.  We are closed weekends and major holidays.  You do have access to a nurse 24-7, just call the main number to the clinic [phone] and do not press any options, hold on the line and a nurse will answer the phone.    For prescription refill requests, have your pharmacy contact  our office and allow 72 hours.    Due to Covid, you will need to wear a mask upon entering the hospital. If you do not have a mask, a mask will be given to you at the Main Entrance upon arrival. For doctor visits, patients may have 1 support person age 79 or older with them. For treatment visits, patients can not have anyone with them due to social distancing guidelines and our immunocompromised population.

## 2024-03-17 ENCOUNTER — Ambulatory Visit: Admitting: Vascular Surgery

## 2024-03-17 ENCOUNTER — Encounter

## 2024-03-17 ENCOUNTER — Encounter: Payer: Self-pay | Admitting: Vascular Surgery

## 2024-03-17 VITALS — BP 155/75 | HR 54 | Ht 62.0 in | Wt 207.0 lb

## 2024-03-17 DIAGNOSIS — I739 Peripheral vascular disease, unspecified: Secondary | ICD-10-CM | POA: Diagnosis not present

## 2024-03-17 DIAGNOSIS — I6523 Occlusion and stenosis of bilateral carotid arteries: Secondary | ICD-10-CM | POA: Diagnosis not present

## 2024-03-17 LAB — VAS US ABI WITH/WO TBI
Left ABI: 0.47
Right ABI: 0.85

## 2024-03-23 DIAGNOSIS — Z0001 Encounter for general adult medical examination with abnormal findings: Secondary | ICD-10-CM | POA: Diagnosis not present

## 2024-03-23 DIAGNOSIS — M109 Gout, unspecified: Secondary | ICD-10-CM | POA: Diagnosis not present

## 2024-03-23 DIAGNOSIS — I10 Essential (primary) hypertension: Secondary | ICD-10-CM | POA: Diagnosis not present

## 2024-03-23 DIAGNOSIS — Z6836 Body mass index (BMI) 36.0-36.9, adult: Secondary | ICD-10-CM | POA: Diagnosis not present

## 2024-03-23 DIAGNOSIS — E1151 Type 2 diabetes mellitus with diabetic peripheral angiopathy without gangrene: Secondary | ICD-10-CM | POA: Diagnosis not present

## 2024-03-23 DIAGNOSIS — E1165 Type 2 diabetes mellitus with hyperglycemia: Secondary | ICD-10-CM | POA: Diagnosis not present

## 2024-05-16 ENCOUNTER — Emergency Department (HOSPITAL_COMMUNITY)

## 2024-05-16 ENCOUNTER — Inpatient Hospital Stay (HOSPITAL_COMMUNITY)
Admission: EM | Admit: 2024-05-16 | Discharge: 2024-05-22 | DRG: 322 | Disposition: A | Discharge status: Home / Self Care | Attending: Internal Medicine | Admitting: Internal Medicine

## 2024-05-16 ENCOUNTER — Encounter (HOSPITAL_COMMUNITY): Payer: Self-pay

## 2024-05-16 ENCOUNTER — Other Ambulatory Visit: Payer: Self-pay

## 2024-05-16 DIAGNOSIS — R079 Chest pain, unspecified: Secondary | ICD-10-CM | POA: Diagnosis not present

## 2024-05-16 DIAGNOSIS — N1832 Chronic kidney disease, stage 3b: Secondary | ICD-10-CM | POA: Diagnosis not present

## 2024-05-16 DIAGNOSIS — I2510 Atherosclerotic heart disease of native coronary artery without angina pectoris: Secondary | ICD-10-CM | POA: Diagnosis present

## 2024-05-16 DIAGNOSIS — E66812 Obesity, class 2: Secondary | ICD-10-CM | POA: Diagnosis present

## 2024-05-16 DIAGNOSIS — Z818 Family history of other mental and behavioral disorders: Secondary | ICD-10-CM

## 2024-05-16 DIAGNOSIS — Z743 Need for continuous supervision: Secondary | ICD-10-CM | POA: Diagnosis not present

## 2024-05-16 DIAGNOSIS — Z951 Presence of aortocoronary bypass graft: Secondary | ICD-10-CM | POA: Diagnosis not present

## 2024-05-16 DIAGNOSIS — E7841 Elevated Lipoprotein(a): Secondary | ICD-10-CM | POA: Diagnosis present

## 2024-05-16 DIAGNOSIS — R0689 Other abnormalities of breathing: Secondary | ICD-10-CM | POA: Diagnosis not present

## 2024-05-16 DIAGNOSIS — I480 Paroxysmal atrial fibrillation: Secondary | ICD-10-CM | POA: Diagnosis not present

## 2024-05-16 DIAGNOSIS — Z79899 Other long term (current) drug therapy: Secondary | ICD-10-CM

## 2024-05-16 DIAGNOSIS — I129 Hypertensive chronic kidney disease with stage 1 through stage 4 chronic kidney disease, or unspecified chronic kidney disease: Secondary | ICD-10-CM | POA: Diagnosis not present

## 2024-05-16 DIAGNOSIS — Z86000 Personal history of in-situ neoplasm of breast: Secondary | ICD-10-CM

## 2024-05-16 DIAGNOSIS — E876 Hypokalemia: Secondary | ICD-10-CM | POA: Diagnosis present

## 2024-05-16 DIAGNOSIS — E1122 Type 2 diabetes mellitus with diabetic chronic kidney disease: Secondary | ICD-10-CM | POA: Diagnosis not present

## 2024-05-16 DIAGNOSIS — C50312 Malignant neoplasm of lower-inner quadrant of left female breast: Secondary | ICD-10-CM | POA: Diagnosis not present

## 2024-05-16 DIAGNOSIS — I259 Chronic ischemic heart disease, unspecified: Secondary | ICD-10-CM | POA: Diagnosis present

## 2024-05-16 DIAGNOSIS — I252 Old myocardial infarction: Secondary | ICD-10-CM | POA: Diagnosis not present

## 2024-05-16 DIAGNOSIS — Z853 Personal history of malignant neoplasm of breast: Secondary | ICD-10-CM

## 2024-05-16 DIAGNOSIS — D509 Iron deficiency anemia, unspecified: Secondary | ICD-10-CM | POA: Diagnosis not present

## 2024-05-16 DIAGNOSIS — E1151 Type 2 diabetes mellitus with diabetic peripheral angiopathy without gangrene: Secondary | ICD-10-CM | POA: Diagnosis not present

## 2024-05-16 DIAGNOSIS — I517 Cardiomegaly: Secondary | ICD-10-CM | POA: Diagnosis not present

## 2024-05-16 DIAGNOSIS — Z923 Personal history of irradiation: Secondary | ICD-10-CM | POA: Diagnosis not present

## 2024-05-16 DIAGNOSIS — Z8269 Family history of other diseases of the musculoskeletal system and connective tissue: Secondary | ICD-10-CM

## 2024-05-16 DIAGNOSIS — Z7984 Long term (current) use of oral hypoglycemic drugs: Secondary | ICD-10-CM | POA: Diagnosis not present

## 2024-05-16 DIAGNOSIS — E782 Mixed hyperlipidemia: Secondary | ICD-10-CM | POA: Diagnosis present

## 2024-05-16 DIAGNOSIS — I350 Nonrheumatic aortic (valve) stenosis: Secondary | ICD-10-CM | POA: Diagnosis not present

## 2024-05-16 DIAGNOSIS — Z8249 Family history of ischemic heart disease and other diseases of the circulatory system: Secondary | ICD-10-CM | POA: Diagnosis not present

## 2024-05-16 DIAGNOSIS — Z7982 Long term (current) use of aspirin: Secondary | ICD-10-CM | POA: Diagnosis not present

## 2024-05-16 DIAGNOSIS — R7989 Other specified abnormal findings of blood chemistry: Secondary | ICD-10-CM | POA: Diagnosis not present

## 2024-05-16 DIAGNOSIS — I214 Non-ST elevation (NSTEMI) myocardial infarction: Secondary | ICD-10-CM | POA: Diagnosis not present

## 2024-05-16 DIAGNOSIS — Z79811 Long term (current) use of aromatase inhibitors: Secondary | ICD-10-CM

## 2024-05-16 DIAGNOSIS — Z6839 Body mass index (BMI) 39.0-39.9, adult: Secondary | ICD-10-CM

## 2024-05-16 DIAGNOSIS — I200 Unstable angina: Principal | ICD-10-CM | POA: Diagnosis present

## 2024-05-16 DIAGNOSIS — Z955 Presence of coronary angioplasty implant and graft: Secondary | ICD-10-CM | POA: Diagnosis not present

## 2024-05-16 DIAGNOSIS — I10 Essential (primary) hypertension: Secondary | ICD-10-CM | POA: Diagnosis present

## 2024-05-16 DIAGNOSIS — R531 Weakness: Secondary | ICD-10-CM | POA: Diagnosis not present

## 2024-05-16 DIAGNOSIS — E785 Hyperlipidemia, unspecified: Secondary | ICD-10-CM | POA: Diagnosis not present

## 2024-05-16 DIAGNOSIS — Z90710 Acquired absence of both cervix and uterus: Secondary | ICD-10-CM

## 2024-05-16 DIAGNOSIS — I25810 Atherosclerosis of coronary artery bypass graft(s) without angina pectoris: Secondary | ICD-10-CM | POA: Diagnosis not present

## 2024-05-16 DIAGNOSIS — Z170 Estrogen receptor positive status [ER+]: Secondary | ICD-10-CM | POA: Diagnosis not present

## 2024-05-16 LAB — CBC
HCT: 32 % — ABNORMAL LOW (ref 36.0–46.0)
Hemoglobin: 9.9 g/dL — ABNORMAL LOW (ref 12.0–15.0)
MCH: 29.3 pg (ref 26.0–34.0)
MCHC: 30.9 g/dL (ref 30.0–36.0)
MCV: 94.7 fL (ref 80.0–100.0)
Platelets: 232 K/uL (ref 150–400)
RBC: 3.38 MIL/uL — ABNORMAL LOW (ref 3.87–5.11)
RDW: 16.2 % — ABNORMAL HIGH (ref 11.5–15.5)
WBC: 6 K/uL (ref 4.0–10.5)
nRBC: 0 % (ref 0.0–0.2)

## 2024-05-16 LAB — TROPONIN I (HIGH SENSITIVITY)
Troponin I (High Sensitivity): 62 ng/L — ABNORMAL HIGH (ref <18)
Troponin I (High Sensitivity): 80 ng/L — ABNORMAL HIGH (ref <18)
Troponin I (High Sensitivity): 111 ng/L (ref <18)
Troponin I (High Sensitivity): 119 ng/L (ref <18)

## 2024-05-16 LAB — BASIC METABOLIC PANEL WITH GFR
Anion gap: 14 (ref 5–15)
BUN: 23 mg/dL (ref 8–23)
CO2: 20 mmol/L — ABNORMAL LOW (ref 22–32)
Calcium: 8.7 mg/dL — ABNORMAL LOW (ref 8.9–10.3)
Chloride: 107 mmol/L (ref 98–111)
Creatinine, Ser: 1.67 mg/dL — ABNORMAL HIGH (ref 0.44–1.00)
GFR, Estimated: 31 mL/min — ABNORMAL LOW (ref >60)
Glucose, Bld: 135 mg/dL — ABNORMAL HIGH (ref 70–99)
Potassium: 3.6 mmol/L (ref 3.5–5.1)
Sodium: 141 mmol/L (ref 135–145)

## 2024-05-16 LAB — HEPARIN LEVEL (UNFRACTIONATED): Heparin Unfractionated: 0.24 [IU]/mL — ABNORMAL LOW (ref 0.30–0.70)

## 2024-05-16 LAB — CBG MONITORING, ED: Glucose-Capillary: 167 mg/dL — ABNORMAL HIGH (ref 70–99)

## 2024-05-16 LAB — MRSA NEXT GEN BY PCR, NASAL: MRSA by PCR Next Gen: NOT DETECTED

## 2024-05-16 MED ORDER — HEPARIN BOLUS VIA INFUSION
4000.0000 [IU] | Freq: Once | INTRAVENOUS | Status: AC
Start: 2024-05-16 — End: 2024-05-16
  Administered 2024-05-16: 4000 [IU] via INTRAVENOUS

## 2024-05-16 MED ORDER — ASPIRIN 81 MG PO CHEW: 324.0000 mg | CHEWABLE_TABLET | ORAL | Status: DC

## 2024-05-16 MED ORDER — NITROGLYCERIN IN D5W 200-5 MCG/ML-% IV SOLN
0.0000 ug/min | INTRAVENOUS | Status: DC
  Administered 2024-05-16: 5 ug/min via INTRAVENOUS
  Filled 2024-05-16: qty 250

## 2024-05-16 MED ORDER — ASPIRIN 81 MG PO TBEC
81.0000 mg | DELAYED_RELEASE_TABLET | Freq: Every day | ORAL | Status: DC
  Administered 2024-05-17: 81 mg via ORAL
  Filled 2024-05-16 (×2): qty 1

## 2024-05-16 MED ORDER — HEPARIN (PORCINE) 25000 UT/250ML-% IV SOLN
1200.0000 [IU]/h | INTRAVENOUS | Status: DC
  Administered 2024-05-17: 1000 [IU]/h via INTRAVENOUS
  Administered 2024-05-18: 1050 [IU]/h via INTRAVENOUS
  Filled 2024-05-16 (×2): qty 250

## 2024-05-16 MED ORDER — METOPROLOL TARTRATE 25 MG PO TABS
25.0000 mg | ORAL_TABLET | Freq: Two times a day (BID) | ORAL | Status: DC
  Administered 2024-05-16 – 2024-05-21 (×9): 25 mg via ORAL
  Filled 2024-05-16 (×11): qty 1

## 2024-05-16 MED ORDER — HYDROMORPHONE HCL 1 MG/ML IJ SOLN
0.5000 mg | Freq: Once | INTRAMUSCULAR | Status: AC
  Administered 2024-05-16: 0.5 mg via INTRAVENOUS
  Filled 2024-05-16: qty 0.5

## 2024-05-16 MED ORDER — NITROGLYCERIN 0.4 MG SL SUBL: 0.4000 mg | SUBLINGUAL_TABLET | SUBLINGUAL | Status: DC | PRN

## 2024-05-16 MED ORDER — FUROSEMIDE 10 MG/ML IJ SOLN
20.0000 mg | Freq: Once | INTRAMUSCULAR | Status: AC
  Administered 2024-05-17: 20 mg via INTRAVENOUS
  Filled 2024-05-16: qty 2

## 2024-05-16 MED ORDER — LATANOPROST 0.005 % OP SOLN
1.0000 [drp] | Freq: Every day | OPHTHALMIC | Status: DC
  Administered 2024-05-16 – 2024-05-21 (×6): 1 [drp] via OPHTHALMIC
  Filled 2024-05-16: qty 2.5

## 2024-05-16 MED ORDER — ASPIRIN 300 MG RE SUPP: 300.0000 mg | RECTAL | Status: DC

## 2024-05-16 MED ORDER — ALLOPURINOL 100 MG PO TABS
100.0000 mg | ORAL_TABLET | Freq: Every day | ORAL | Status: DC
Start: 2024-05-16 — End: 2024-05-22
  Administered 2024-05-16 – 2024-05-21 (×6): 100 mg via ORAL
  Filled 2024-05-16 (×6): qty 1

## 2024-05-16 MED ORDER — VITAMIN B-12 1000 MCG PO TABS
500.0000 ug | ORAL_TABLET | Freq: Every day | ORAL | Status: DC
  Administered 2024-05-16 – 2024-05-22 (×7): 500 ug via ORAL
  Filled 2024-05-16 (×7): qty 1

## 2024-05-16 MED ORDER — LACTATED RINGERS IV SOLN
INTRAVENOUS | Status: DC
Start: 2024-05-16 — End: 2024-05-16

## 2024-05-16 MED ORDER — HEPARIN (PORCINE) 25000 UT/250ML-% IV SOLN
900.0000 [IU]/h | INTRAVENOUS | Status: DC
Start: 2024-05-16 — End: 2024-05-16
  Administered 2024-05-16: 900 [IU]/h via INTRAVENOUS
  Filled 2024-05-16: qty 250

## 2024-05-16 MED ORDER — ASPIRIN 81 MG PO CHEW
324.0000 mg | CHEWABLE_TABLET | Freq: Once | ORAL | Status: AC
  Administered 2024-05-16: 324 mg via ORAL
  Filled 2024-05-16: qty 4

## 2024-05-16 MED ORDER — VITAMIN D 25 MCG (1000 UNIT) PO TABS
2000.0000 [IU] | ORAL_TABLET | Freq: Every day | ORAL | Status: DC
  Administered 2024-05-16 – 2024-05-22 (×7): 2000 [IU] via ORAL
  Filled 2024-05-16 (×7): qty 2

## 2024-05-16 MED ORDER — VITAMIN C 500 MG PO TABS
1000.0000 mg | ORAL_TABLET | Freq: Every day | ORAL | Status: DC
  Administered 2024-05-16 – 2024-05-22 (×7): 1000 mg via ORAL
  Filled 2024-05-16 (×7): qty 2

## 2024-05-16 MED ORDER — ATORVASTATIN CALCIUM 40 MG PO TABS
40.0000 mg | ORAL_TABLET | Freq: Every day | ORAL | Status: DC
  Administered 2024-05-17: 40 mg via ORAL
  Filled 2024-05-16: qty 1

## 2024-05-16 MED ORDER — ACETAMINOPHEN 325 MG PO TABS
650.0000 mg | ORAL_TABLET | ORAL | Status: DC | PRN
  Administered 2024-05-18 (×2): 650 mg via ORAL
  Filled 2024-05-16 (×2): qty 2

## 2024-05-16 MED ORDER — ASPIRIN 81 MG PO CHEW
81.0000 mg | CHEWABLE_TABLET | Freq: Every day | ORAL | Status: DC
  Administered 2024-05-17 – 2024-05-22 (×5): 81 mg via ORAL
  Filled 2024-05-16 (×5): qty 1

## 2024-05-16 MED ORDER — ONDANSETRON HCL 4 MG/2ML IJ SOLN: 4.0000 mg | Freq: Four times a day (QID) | INTRAMUSCULAR | Status: DC | PRN

## 2024-05-16 MED ORDER — INSULIN ASPART 100 UNIT/ML IJ SOLN
0.0000 [IU] | Freq: Three times a day (TID) | INTRAMUSCULAR | Status: DC
  Administered 2024-05-18 (×2): 1 [IU] via SUBCUTANEOUS
  Administered 2024-05-18: 2 [IU] via SUBCUTANEOUS
  Administered 2024-05-19: 1 [IU] via SUBCUTANEOUS
  Administered 2024-05-19 – 2024-05-20 (×2): 2 [IU] via SUBCUTANEOUS
  Administered 2024-05-21 (×2): 1 [IU] via SUBCUTANEOUS
  Administered 2024-05-22: 2 [IU] via SUBCUTANEOUS

## 2024-05-16 MED ORDER — HYDRALAZINE HCL 25 MG PO TABS
25.0000 mg | ORAL_TABLET | Freq: Every day | ORAL | Status: DC
Start: 2024-05-16 — End: 2024-05-20
  Administered 2024-05-16 – 2024-05-20 (×5): 25 mg via ORAL
  Filled 2024-05-16 (×5): qty 1

## 2024-05-16 MED ORDER — ATORVASTATIN CALCIUM 40 MG PO TABS
40.0000 mg | ORAL_TABLET | Freq: Every day | ORAL | Status: DC
  Administered 2024-05-16 – 2024-05-22 (×7): 40 mg via ORAL
  Filled 2024-05-16 (×7): qty 1

## 2024-05-16 NOTE — Progress Notes (Signed)
 PHARMACY - ANTICOAGULATION CONSULT NOTE  Pharmacy Consult for Heparin  Indication: chest pain/ACS  No Known Allergies  Patient Measurements: Height: 5' 2 (157.5 cm) Weight: 89.2 kg (196 lb 10.4 oz) IBW/kg (Calculated) : 50.1 HEPARIN  DW (KG): 70.6  Vital Signs: Temp: 98.5 F (36.9 C) (09/20 2019) Temp Source: Oral (09/20 2019) BP: 156/53 (09/20 2019) Pulse Rate: 66 (09/20 2019)  Labs: Recent Labs    05/16/24 0947 05/16/24 1208 05/16/24 1724 05/16/24 2039  HGB 9.9*  --   --   --   HCT 32.0*  --   --   --   PLT 232  --   --   --   HEPARINUNFRC  --   --   --  0.24*  CREATININE 1.67*  --   --   --   TROPONINIHS 62* 80* 111*  --     Estimated Creatinine Clearance: 28.3 mL/min (A) (by C-G formula based on SCr of 1.67 mg/dL (H)).   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; left breast DCIS 02/05/22   Carotid artery occlusion    Chronic kidney disease    mild, stage 1   DM (diabetes mellitus) (HCC)    takes Metformin  1000mg  BID   Dyslipidemia    takes Pravastatin  daily   Early cataracts, bilateral    Gout    takes Allopurinol  daily   Herniated disc    left   Lumbar herniated disc    Myocardial infarction [facility]) 2008   pt has 2 stents   Nocturia    Obesity    Personal history of radiation therapy    Unspecified essential hypertension    takes Metoprolol ,Lisinopril ,and Amlodipine    Urinary frequency     Medications:  See med rec  Assessment: 78 year old female, she has a history of triple bypass, vascular disease, carotid artery disease, hypertension,  hypercholesterolemia and diabetes, presents with chest pain that radiates to her left arm. Patient having some SOB and nausea. Not on oral anticoagulation. Pharmacy asked to start heparin .   PM update 9/20 : 8 hour Heparin  level is 0.24 on heparin  900 units/hr.  Subtherapeutic. No documentation of any issues or interruptions with heparin  infusion. No bleeding reported.  Hgb 9.9 on admit an pltc 80> 111k.     Goal of Therapy:  Heparin  level 0.3-0.7 units/ml Monitor platelets by anticoagulation protocol: Yes    Plan:  Increase Heparin  rate to 1000 units/hr Check 6-8 hour heparin  level. Daily heparin  level and CBC while on heparin  IV.  Thank you for allowing pharmacy to be part of this patients care team.  Levorn Gaskins, RPh Clinical Pharmacist 05/16/2024,9:24 PM

## 2024-05-16 NOTE — ED Notes (Signed)
 Pt states she took a nitroglycerin  last night around 2130.

## 2024-05-16 NOTE — ED Notes (Signed)
 Called [phone] to give report. No answer. Will call again shortly.

## 2024-05-16 NOTE — ED Provider Notes (Signed)
 [place] [facility]ERGENCY DEPARTMENT AT [facility] Provider Note   CSN: [id number] Arrival date & time: 05/16/24  9183     Patient presents with: Chest Pain   [name] is a 78 y.o. female.    Chest Pain    This patient is a 78 year old female, she has a history of triple bypass, vascular disease, carotid artery disease, hypertension, hypercholesterolemia and diabetes, she does not smoke cigarettes or drink any alcohol .  She reports that 3 to 4 days ago she started to have some intermittent pain in her chest going to her left arm, associated with some shortness of breath and nausea but no diaphoresis.  Similar to what she had before with coronary disease.  Symptoms are persistent this morning, not associated with fevers or swelling of her legs.  She finished her treatment for breast cancer in the past and no one or takes any medication, she is in remission as far she knows, she has not had swelling of her legs  Prior to Admission medications   Medication Sig Start Date End Date Taking? Authorizing Provider  acetaminophen  (TYLENOL ) 500 MG tablet Take 500 mg by mouth 2 (two) times daily as needed for moderate pain or headache.     [provider]  allopurinol  (ZYLOPRIM ) 100 MG tablet Take 100 mg by mouth at bedtime.    [provider]  amLODipine  (NORVASC ) 10 MG tablet TAKE 1 TABLET EVERY DAY  *DOSE  INCREASE* 12/15/15   Alvan Dorn FALCON, MD  anastrozole  (ARIMIDEX ) 1 MG tablet TAKE 1 TABLET BY MOUTH EVERY DAY 10/23/23   Rogers Hai, MD  Ascorbic Acid  (VITAMIN C ) 1000 MG tablet Take 1,000 mg by mouth daily.    [provider]  aspirin  81 MG EC tablet Take 81 mg by mouth at bedtime.    [provider]  atorvastatin  (LIPITOR) 40 MG tablet Take 1 tablet by mouth daily. 11/30/20   [provider]  chlorthalidone  (HYGROTON ) 25 MG tablet Take 12.5 mg by mouth every morning. Patient taking differently: Take 12.5 mg by mouth every  other day. 02/06/24   [provider]  Cholecalciferol  (VITAMIN D ) 50 MCG (2000 UT) tablet Take 2,000 Units by mouth daily.    [provider]  colchicine  0.6 MG tablet Take one tablet PO and may repeat in one hour if no improvement. Then take one tablet every 3 hours until pain is relieved. 06/20/14   Jamelle Lorrayne HERO, NP  COMIRNATY SUSP injection  06/11/22   [provider]  fluticasone (FLONASE) 50 MCG/ACT nasal spray Place 1 spray into both nostrils as needed for allergies or rhinitis. 05/10/20   [provider]  hydrALAZINE  (APRESOLINE ) 25 MG tablet Take 25 mg by mouth See admin instructions. Take 25 mg daily may take a second 25 mg dose as needed for high blood pressure 02/23/21 03/17/24  [provider]  lisinopril  (PRINIVIL ,ZESTRIL ) 40 MG tablet Take 1 tablet (40 mg total) by mouth daily. 10/23/11   Darron Deatrice LABOR, MD  LUMIGAN 0.01 % SOLN Place 1 drop into the right eye at bedtime. 01/30/22   [provider]  Menthol , Topical Analgesic, (ICY HOT EX) Apply 1 Application topically daily as needed (pain).    [provider]  metFORMIN  (GLUCOPHAGE ) 500 MG tablet Take 500 mg by mouth 2 (two) times daily with a meal.     [provider]  metoprolol  tartrate (LOPRESSOR ) 50 MG tablet Take 1 tablet (50 mg total) by mouth 2 (  two) times daily. 12/27/20   Alvan Dorn FALCON, MD  nitroGLYCERIN  (NITROSTAT ) 0.4 MG SL tablet Place 1 tablet (0.4 mg total) under the tongue every 5 (five) minutes x 3 doses as needed for chest pain (If no relief after 3rd dose, proceed to the ED or call 911). 01/18/23   Alvan Dorn FALCON, MD  potassium chloride  (KLOR-CON ) 10 MEQ tablet Take 10 mEq by mouth 3 (three) times a week. 02/06/24   [provider]  tiZANidine (ZANAFLEX) 4 MG tablet Take by mouth as needed. 09/17/17   [provider]  vitamin B-12 (CYANOCOBALAMIN ) 500 MCG tablet Take 500 mcg by mouth daily.    [provider]     Allergies: Patient has no known allergies.    Review of Systems  Cardiovascular:  Positive for chest pain.  All other systems reviewed and are negative.   Updated Vital Signs BP (!) 176/63 (BP Location: Right Arm)   Pulse 60   Temp 98.1 F (36.7 C) (Oral)   Resp 20   Ht 1.575 m (5' 2)   Wt 97.5 kg   SpO2 98%   BMI 39.32 kg/m   Physical Exam Vitals and nursing note reviewed.  Constitutional:      General: She is not in acute distress.    Appearance: She is well-developed.  HENT:     Head: Normocephalic and atraumatic.     Mouth/Throat:     Pharynx: No oropharyngeal exudate.  Eyes:     General: No scleral icterus.       Right eye: No discharge.        Left eye: No discharge.     Conjunctiva/sclera: Conjunctivae normal.     Pupils: Pupils are equal, round, and reactive to light.  Neck:     Thyroid : No thyromegaly.     Vascular: No JVD.  Cardiovascular:     Rate and Rhythm: Normal rate and regular rhythm.     Heart sounds: Normal heart sounds. No murmur heard.    No friction rub. No gallop.  Pulmonary:     Effort: Pulmonary effort is normal. No respiratory distress.     Breath sounds: Normal breath sounds. No wheezing or rales.  Abdominal:     General: Bowel sounds are normal. There is no distension.     Palpations: Abdomen is soft. There is no mass.     Tenderness: There is no abdominal tenderness.  Musculoskeletal:        General: No tenderness. Normal range of motion.     Cervical back: Normal range of motion and neck supple.  Lymphadenopathy:     Cervical: No cervical adenopathy.  Skin:    General: Skin is warm and dry.     Findings: No erythema or rash.  Neurological:     Mental Status: She is alert.     Coordination: Coordination normal.  Psychiatric:        Behavior: Behavior normal.     (all labs ordered are listed, but only abnormal results are displayed) Labs Reviewed  BASIC METABOLIC PANEL WITH GFR - Abnormal; Notable for the following  components:      Result Value   CO2 20 (*)    Glucose, Bld 135 (*)    Creatinine, Ser 1.67 (*)    Calcium  8.7 (*)    GFR, Estimated 31 (*)    All other components within normal limits  CBC - Abnormal; Notable for the following components:   RBC 3.38 (*)  Hemoglobin 9.9 (*)    HCT 32.0 (*)    RDW 16.2 (*)    All other components within normal limits  CBG MONITORING, ED - Abnormal; Notable for the following components:   Glucose-Capillary 167 (*)    All other components within normal limits  TROPONIN I (HIGH SENSITIVITY) - Abnormal; Notable for the following components:   Troponin I (High Sensitivity) 62 (*)    All other components within normal limits  TROPONIN I (HIGH SENSITIVITY)    EKG: EKG Interpretation Date/Time:  Saturday May 16 2024 08:35:13 EDT Ventricular Rate:  60 PR Interval:  213 QRS Duration:  89 QT Interval:  473 QTC Calculation: 473 R Axis:   79  Text Interpretation: Sinus rhythm Borderline prolonged PR interval Anterior infarct, old Minimal ST depression, inferior leads Since last tracing T wave abnormality compared with prior Confirmed by Cleotilde Rogue ([id number]) on 05/16/2024 8:45:50 AM  Radiology: DG Chest Portable 1 View Result Date: 05/16/2024 CLINICAL DATA:  Chest pain for several days. Coronary artery disease. EXAM: PORTABLE CHEST 1 VIEW COMPARISON:  05/27/2014 FINDINGS: Mild cardiomegaly noted. Prior CABG also seen. Both lungs are clear. No evidence of pneumothorax or pleural effusion. IMPRESSION: Mild cardiomegaly. No active lung disease. Electronically Signed   By: Norleen DELENA Kil M.D.   On: 05/16/2024 09:13     .Critical Care  Performed by: Cleotilde Rogue, MD Authorized by: Cleotilde Rogue, MD   Critical care provider statement:    Critical care time (minutes):  45   Critical care time was exclusive of:  Separately billable procedures and treating other patients and teaching time   Critical care was necessary to treat or prevent imminent or  life-threatening deterioration of the following conditions:  Cardiac failure   Critical care was time spent personally by me on the following activities:  Development of treatment plan with patient or surrogate, discussions with consultants, evaluation of patient's response to treatment, examination of patient, obtaining history from patient or surrogate, review of old charts, re-evaluation of patient's condition, pulse oximetry, ordering and review of radiographic studies, ordering and review of laboratory studies and ordering and performing treatments and interventions   I assumed direction of critical care for this patient from another provider in my specialty: no     Care discussed with: admitting provider   Comments:          Medications Ordered in the ED  aspirin  chewable tablet 324 mg (324 mg Oral Given 05/16/24 0857)                                    Medical Decision Making Amount and/or Complexity of Data Reviewed Labs: ordered. Radiology: ordered. ECG/medicine tests: ordered.  Risk OTC drugs.   The patient's exam is rather unremarkable, she does not have any signs of overt pulmonary embolism she is not tachycardic hypoxic and has no edema.  Her symptoms are far more concerning for ACS, aspirin  given, labs and x-ray ordered, EKG ordered.  The patient is agreeable to the plan and I anticipate admission given her symptoms and history   This patient presents to the ED for concern of chest pain, this involves an extensive number of treatment options, and is a complaint that carries with it a high risk of complications and morbidity.  The differential diagnosis includes acute coronary syndrome, specifically I suspect that she has unstable angina versus a non-ST elevation MI, this could be  other causes but does seem to be the most likely.   Co morbidities / Chronic conditions that complicate the patient evaluation  Obesity, prior triple coronary bypass   Additional history  obtained:  Additional history obtained from EMR External records from outside source obtained and reviewed including medical records including prior cardiac evaluations   Lab Tests:  I Ordered, and personally interpreted labs.  The pertinent results include: Troponin is elevated, creatinine is 1.67, CBC without leukocytosis but does have a mild progressive anemia   Imaging Studies ordered:  I ordered imaging studies including chest x-ray I independently visualized and interpreted imaging which showed cardiomegaly but no other acute findings I agree with the radiologist interpretation   Cardiac Monitoring: / EKG:  The patient was maintained on a cardiac monitor.  I personally viewed and interpreted the cardiac monitored which showed an underlying rhythm of: Sinus rhythm   Problem List / ED Course / Critical interventions / Medication management  This patient appears uncomfortable, her symptoms are consistent with acute coronary syndrome and she will be treated as such with aspirin  and heparin .  She will need to have consultation with cardiology I ordered medication including aspirin  and heparin  drip Reevaluation of the patient after these medicines showed that the patient slight improvement I have reviewed the patients home medicines and have made adjustments as needed   Consultations Obtained:  I requested consultation with the cardiologist Dr. Inocencio,  and discussed lab and imaging findings as well as pertinent plan - they recommend: Admission to the hospitalist service due to the very low troponin, her progressive anemia Will discuss with hospitalist for admission   Social Determinants of Health:  Known coronary disease, obese   Test / Admission - Considered:  Admit to high level of care      Final diagnoses:  Unstable angina [facility])    ED Discharge Orders     None          Cleotilde Rogue, MD 05/16/24 1227

## 2024-05-16 NOTE — Progress Notes (Signed)
 PHARMACY - ANTICOAGULATION CONSULT NOTE  Pharmacy Consult for Heparin  Indication: chest pain/ACS  No Known Allergies  Patient Measurements: Height: 5' 2 (157.5 cm) Weight: 97.5 kg (215 lb) IBW/kg (Calculated) : 50.1 HEPARIN  DW (KG): 73.1  Vital Signs: Temp: 98.1 F (36.7 C) (09/20 0828) Temp Source: Oral (09/20 0828) BP: 176/63 (09/20 1108) Pulse Rate: 60 (09/20 1108)  Labs: Recent Labs    05/16/24 0947  HGB 9.9*  HCT 32.0*  PLT 232  CREATININE 1.67*  TROPONINIHS 62*    Estimated Creatinine Clearance: 29.8 mL/min (A) (by C-G formula based on SCr of 1.67 mg/dL (H)).   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; left breast DCIS 02/05/22   Carotid artery occlusion    Chronic kidney disease    mild, stage 1   DM (diabetes mellitus) (HCC)    takes Metformin  1000mg  BID   Dyslipidemia    takes Pravastatin  daily   Early cataracts, bilateral    Gout    takes Allopurinol  daily   Herniated disc    left   Lumbar herniated disc    Myocardial infarction [facility]) 2008   pt has 2 stents   Nocturia    Obesity    Personal history of radiation therapy    Unspecified essential hypertension    takes Metoprolol ,Lisinopril ,and Amlodipine    Urinary frequency     Medications:  See med rec  Assessment: 78 year old female, she has a history of triple bypass, vascular disease, carotid artery disease, hypertension, hypercholesterolemia and diabetes, presents with chest pain that radiates to her left arm. Patient having some SOB and nausea. Not on oral anticoagulation. Pharmacy asked to  start heparin    Goal of Therapy:  Heparin  level 0.3-0.7 units/ml Monitor platelets by anticoagulation protocol: Yes   Plan:  Give 4000 units bolus x 1 Start heparin  infusion at 900 units/hr Check anti-Xa level in ~ 8 hours and daily while on heparin  Continue to monitor H&H and platelets  Jarreau Callanan, BS Pharm D, BCPS Clinical Pharmacist 05/16/2024,12:32 PM

## 2024-05-16 NOTE — H&P (Addendum)
 History and Physical    Patient: [name] FMW:[id number] DOB: 1945-11-11 DOA: 05/16/2024 DOS: the patient was seen and examined on 05/16/2024 PCP: Lari Elspeth BRAVO, MD  Patient coming from: Home  Chief Complaint:  Chief Complaint  Patient presents with   Chest Pain   HPI: [name] is a 78 y/o female with history of DM2, hypertension, hyperlipidemia, coronary disease status post CABG, peripheral vascular disease, CKD stage III, and left breast cancer presenting with chest pain that started on 05/13/2024.  She describes it as a sharp pain substernal radiating down her left arm.  She states that it is worse with exertion and better with rest.  She states that her chest discomfort has been on and off, but has had essentially constant left arm and shoulder discomfort and pain since 05/14/2024.  She has had some fatigue and dyspnea on exertion.  She denies any fevers, chills, coughing, hemoptysis, nausea, vomiting, diarrhea, syncope. Because of her persistent chest discomfort and left arm pain, she presented for further evaluation and treatment. She states that she did take one sublingual nitroglycerin  on the evening of 05/15/2024 which did give her some temporary relief of her chest discomfort and arm pain, but the discomfort returned.  She denies any new medications.  She endorses compliance with all her home medications.  In the ED, the patient was afebrile and hemodynamically stable with oxygen saturation 98% on room air.  Troponin 62>> 80.  WBC 6.0, hemoglobin 9.9, platelets 232.  Sodium 141, potassium 3.6, bicarbonate 20, serum creatinine of 1.87.  Chest x-ray negative for infiltrates.  EKG sinus rhythm, no STT wave change. The patient was started on IV heparin .  EDP spoke with cardiology, Dr. Alton transfer to [facility] for [facility]ology consult  Review of Systems: As mentioned in the history of present illness. All other systems reviewed and are negative. 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; left breast DCIS 02/05/22   Carotid artery occlusion    Chronic kidney disease    mild, stage 1   DM (diabetes mellitus) (HCC)    takes Metformin  1000mg  BID   Dyslipidemia    takes Pravastatin  daily   Early cataracts, bilateral    Gout    takes Allopurinol  daily   Herniated disc    left   Lumbar herniated disc    Myocardial infarction [facility]) 2008   pt has 2 stents   Nocturia    Obesity    Personal history of radiation therapy    Unspecified essential hypertension    takes Metoprolol ,Lisinopril ,and Amlodipine    Urinary frequency    Past Surgical History:  Procedure Laterality Date   ABDOMINAL HYSTERECTOMY     BACK SURGERY  2009/2010/2011   BREAST BIOPSY  unknown   left   BREAST BIOPSY Left 02/05/2022   Times 2   BREAST LUMPECTOMY WITH RADIOACTIVE SEED LOCALIZATION Left 03/03/2019   Procedure: LEFT BREAST LUMPECTOMY WITH RADIOACTIVE SEED LOCALIZATION;  Surgeon: Ebbie Cough, MD;  Location: [facility];  Service: General;  Laterality: Left;  BREAST LUMPECTOMY WITH RADIOACTIVE SEED LOCALIZATION Left 03/14/2022   Procedure: LEFT BREAST SEED GUIDED LUMPECTOMY;  Surgeon: Ebbie Cough, MD;  Location: [facility] OR;  Service: General;  Laterality: Left;   CARDIAC CATHETERIZATION  08/27/2006   NON-STEMI/TAXUS STENTING 100% PROXIMAL LAD (X2) JANUARY 2008   CARDIAC CATHETERIZATION  08/27/2010   occluded LAD stents, Om1 : 70%, RCA: 80%, Ef 40-45%   CARPAL TUNNEL RELEASE     bilateral   CATARACT EXTRACTION W/PHACO Right 10/11/2017   Procedure: CATARACT EXTRACTION PHACO AND  INTRAOCULAR LENS PLACEMENT RIGHT EYE;  Surgeon: Harrie Agent, MD;  Location: [facility];  Service: Ophthalmology;  Laterality: Right;  CDE: 5.17   CATARACT EXTRACTION W/PHACO Left 12/27/2017   Procedure: CATARACT EXTRACTION PHACO AND INTRAOCULAR LENS PLACEMENT (IOC);  Surgeon: Harrie Agent, MD;  Location: [facility];  Service: Ophthalmology;  Laterality: Left;  CDE: 2.99   CHOLECYSTECTOMY  not sure   COLONOSCOPY     CORONARY ARTERY BYPASS GRAFT  09/05/2011   Procedure: CORONARY ARTERY BYPASS GRAFTING (CABG);  Surgeon: Dallas KATHEE Jude, MD;  Location: [facility];  Service: Open Heart Surgery;  Laterality: N/A;  CABG times three using left internal mammary artery and left leg greater saphenous vein harvested endoscopically   LEG TENDON SURGERY  unknown   left    LUMBAR LAMINECTOMY/DECOMPRESSION MICRODISCECTOMY Left 06/04/2014   Procedure: LUMBAR LAMINECTOMY/DECOMPRESSION MICRODISCECTOMY LUMBAR FOUR-FIVE;  Surgeon: Arley SHAUNNA Helling, MD;  Location: [facility];  Service: Neurosurgery;  Laterality: Left;   MASS EXCISION Left 08/08/2018   Procedure: EXCISION 5CM LIPOMA ON BACK;  Surgeon: Kallie Manuelita BROCKS, MD;  Location: [facility];  Service: General;  Laterality: Left;   TONSILLECTOMY     Social History:  reports that she has never smoked. She has never used smokeless tobacco. She reports that she does not drink alcohol  and does not use drugs.  No Known Allergies  Family History  Problem Relation Age of Onset   Hypertension Father    Heart attack Sister 33   Heart attack Brother 87   Dementia Mother    Lupus Brother    Heart attack Brother    Hypertension Brother    Anesthesia problems Neg Hx    Hypotension Neg Hx    Malignant hyperthermia Neg Hx    Pseudochol deficiency Neg Hx     Prior to Admission medications   Medication Sig Start Date End Date Taking? Authorizing Provider  acetaminophen  (TYLENOL ) 500 MG tablet Take 500 mg by mouth 2 (two) times daily as needed for moderate pain or headache.      [provider]  allopurinol  (ZYLOPRIM ) 100 MG tablet Take 100 mg by mouth at bedtime.    [provider]  amLODipine  (NORVASC ) 10 MG tablet TAKE 1 TABLET EVERY DAY  *DOSE  INCREASE* 12/15/15   Alvan Dorn FALCON, MD  anastrozole  (ARIMIDEX ) 1 MG tablet TAKE 1 TABLET BY MOUTH EVERY DAY 10/23/23   Rogers Hai, MD  Ascorbic Acid  (VITAMIN C ) 1000 MG tablet Take 1,000 mg by mouth daily.    [provider]  aspirin  81 MG EC tablet Take 81 mg by mouth at bedtime.    [provider]  atorvastatin  (LIPITOR) 40 MG tablet Take 1 tablet by mouth daily. 11/30/20   [provider]  chlorthalidone  (HYGROTON ) 25 MG tablet Take 12.5 mg by mouth every morning. Patient taking differently: Take 12.5 mg by mouth every other day. 02/06/24   [provider]  Cholecalciferol  (VITAMIN D ) 50 MCG (2000 UT)  tablet Take 2,000 Units by mouth daily.    [provider]  colchicine  0.6 MG tablet Take one tablet PO and may repeat in one hour if no improvement. Then take one tablet every 3 hours until pain is relieved. 06/20/14   Jamelle Lorrayne HERO, NP  COMIRNATY SUSP injection  06/11/22   [provider]  fluticasone (FLONASE) 50 MCG/ACT nasal spray Place 1 spray into both nostrils as needed for allergies or rhinitis. 05/10/20   [provider]  hydrALAZINE  (APRESOLINE ) 25 MG tablet Take 25 mg by mouth See admin instructions. Take 25 mg daily may take a second 25 mg dose as needed for high blood pressure 02/23/21 03/17/24  [provider]  lisinopril  (PRINIVIL ,ZESTRIL ) 40 MG tablet Take 1 tablet (40 mg total) by mouth daily. 10/23/11   Darron Deatrice LABOR, MD  LUMIGAN 0.01 % SOLN Place 1 drop into the right eye at bedtime. 01/30/22   [provider]  Menthol , Topical Analgesic, (ICY HOT EX) Apply 1 Application topically daily as needed (pain).    [provider]  metFORMIN  (GLUCOPHAGE ) 500 MG tablet Take 500 mg by mouth 2 (two) times  daily with a meal.     [provider]  metoprolol  tartrate (LOPRESSOR ) 50 MG tablet Take 1 tablet (50 mg total) by mouth 2 (two) times daily. 12/27/20   Alvan Dorn FALCON, MD  nitroGLYCERIN  (NITROSTAT ) 0.4 MG SL tablet Place 1 tablet (0.4 mg total) under the tongue every 5 (five) minutes x 3 doses as needed for chest pain (If no relief after 3rd dose, proceed to the ED or call 911). 01/18/23   Alvan Dorn FALCON, MD  potassium chloride  (KLOR-CON ) 10 MEQ tablet Take 10 mEq by mouth 3 (three) times a week. 02/06/24   [provider]  tiZANidine (ZANAFLEX) 4 MG tablet Take by mouth as needed. 09/17/17   [provider]  vitamin B-12 (CYANOCOBALAMIN ) 500 MCG tablet Take 500 mcg by mouth daily.    [provider]    Physical Exam: Vitals:   05/16/24 0828 05/16/24 0830 05/16/24 1108 05/16/24 1236  BP:   (!) 176/63   Pulse:   60   Resp:   20   Temp: 98.1 F (36.7 C)   97.9 F (36.6 C)  TempSrc: Oral   Oral  SpO2:   98%   Weight:  97.5 kg    Height:  5' 2 (1.575 m)     GENERAL:  A&O x 3, NAD, well developed, cooperative, follows commands HEENT: [place]/AT, No thrush, No icterus, No oral ulcers Neck:  No neck mass, No meningismus, soft, supple CV: RRR, no S3, no S4, no rub, no JVD Lungs:  CTA, no wheeze, no rhonchi, good air movement Abd: soft/NT +BS, nondistended Ext: No edema, no lymphangitis, no cyanosis, no rashes Neuro:  CN II-XII intact, strength 4/5 in RUE, RLE, strength 4/5 LUE, LLE; sensation intact bilateral; no dysmetria; babinski equivocal  Data Reviewed: Data reviewed above in the history Assessment and Plan: Unstable angina -Start IV heparin  - Cardiology consulted - EDP spoke with Dr. Tomie to [facility] for cardiology consult - Troponin 62>> 80 - Start IV nitroglycerin  drip if patient has persistent pain  CKD stage IIIb - Baseline creatinine 1.6-1.7 - Start gentle IV fluids - AM BMP  Essential hypertension - Restart metoprolol  -  Holding lisinopril  secondary to CKD - Holding chlorthalidone  - Continue hydralazine  - Patient states that she does not take amlodipine   Mixed hyperlipidemia - Continue Lipitor -  Check lipid panel  Diabetes mellitus type 2 - Holding metformin  - Check hemoglobin A1c - NovoLog  sliding scale  Left breast cancer - Patient has finished 5 years of Arimidex  - Status postlumpectomy   Peripheral arterial disease - 03/17/2024 ABI shows bilateral severe disease - Follow-up Dr. Magda  Coronary artery disease -CABG Jan 2013: 3 vessel (LIMA to LAD, SVG to OM, SVG to acute marginal)  - She has had prior stenting - She follows Dr. Alvan Muff II obesity - BMI 39.32 - Lifestyle modification   Advance Care Planning: FULL  Consults: cardiology  Family Communication: daughter 9/20  Severity of Illness: The appropriate patient status for this patient is INPATIENT. Inpatient status is judged to be reasonable and necessary in order to provide the required intensity of service to ensure the patient's safety. The patient's presenting symptoms, physical exam findings, and initial radiographic and laboratory data in the context of their chronic comorbidities is felt to place them at high risk for further clinical deterioration. Furthermore, it is not anticipated that the patient will be medically stable for discharge from the hospital within 2 midnights of admission.   * I certify that at the point of admission it is my clinical judgment that the patient will require inpatient hospital care spanning beyond 2 midnights from the point of admission due to high intensity of service, high risk for further deterioration and high frequency of surveillance required.*  Author: Alm Schneider, MD 05/16/2024 12:38 PM  For on call review [email or web address].

## 2024-05-16 NOTE — Hospital Course (Addendum)
 78 y/o female with history of DM2, hypertension, hyperlipidemia, coronary disease status post CABG, peripheral vascular disease, CKD stage III, and left breast cancer presenting with chest pain that started on 05/13/2024.  She describes it as a sharp pain substernal radiating down her left arm.  She states that it is worse with exertion and better with rest.  She states that her chest discomfort has been on and off, but has had essentially constant left arm and shoulder discomfort and pain since 05/14/2024.  She has had some fatigue and dyspnea on exertion.  She denies any fevers, chills, coughing, hemoptysis, nausea, vomiting, diarrhea, syncope. Because of her persistent chest discomfort and left arm pain, she presented for further evaluation and treatment. She states that she did take one sublingual nitroglycerin  on the evening of 05/15/2024 which did give her some temporary relief of her chest discomfort and arm pain, but the discomfort returned.  She denies any new medications.  She endorses compliance with all her home medications.  In the ED, the patient was afebrile and hemodynamically stable with oxygen saturation 98% on room air.  Troponin 62>> 80.  WBC 6.0, hemoglobin 9.9, platelets 232.  Sodium 141, potassium 3.6, bicarbonate 20, serum creatinine of 1.87.  Chest x-ray negative for infiltrates.  EKG sinus rhythm, no STT wave change. The patient was started on IV heparin .  EDP spoke with cardiology, Dr. Alton transfer to [facility] for [facility]ology consult

## 2024-05-16 NOTE — Plan of Care (Signed)
   Problem: Education: Goal: Knowledge of General Education information will improve Description Including pain rating scale, medication(s)/side effects and non-pharmacologic comfort measures Outcome: Progressing

## 2024-05-16 NOTE — Consult Note (Signed)
 Cardiology Consult Note:   Patient ID: [name] MRN: [id number]; DOB: [birth year]   Admission date: 05/16/2024  Primary Care Provider: Lari Elspeth BRAVO, MD Primary Cardiologist: Alvan Carrier, MD  Primary Electrophysiologist:  None   Patient Profile:   [name] is a 78 y.o. female with DM2, HTN, HLD, CAD s/p CABG, PAD, CKD III  History of Present Illness:   [name] presents to the ED with substernal chest pain that radiates down to her left arm x4 days.  When the pain initially began she thought it was related to coming off of the Anastrozole  that she was on for 5 years.  However as the days progressed the pain became more intense and occurred more frequently.  Yesterday, she thought the pain was similar to her first heart attack that she had back in 2008. She then took nitroglycerin  and had some alleviation of her symptoms.  She presented today to the emergency department because her pain had gotten significantly worse.  In the ED, she was hemodynamically stable with a troponin I  62 > 80. A hgb level of 9.9 (11.8 2 months prior)  MCV 94.7, Cr 1.67 (baseline 1.16).    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; left breast DCIS 02/05/22   Carotid artery occlusion    Chronic kidney disease    mild, stage 1   DM (diabetes mellitus) (HCC)    takes Metformin  1000mg  BID   Dyslipidemia    takes Pravastatin  daily   Early cataracts, bilateral    Gout    takes Allopurinol  daily   Herniated disc    left   Lumbar herniated disc    Myocardial infarction [facility]) 2008   pt has 2 stents   Nocturia     Obesity    Personal history of radiation therapy    Unspecified essential hypertension    takes Metoprolol ,Lisinopril ,and Amlodipine    Urinary frequency     Past Surgical History:  Procedure Laterality Date   ABDOMINAL HYSTERECTOMY     BACK SURGERY  2009/2010/2011   BREAST BIOPSY  unknown   left   BREAST BIOPSY Left 02/05/2022   Times 2   BREAST LUMPECTOMY WITH RADIOACTIVE SEED LOCALIZATION Left 03/03/2019   Procedure: LEFT BREAST LUMPECTOMY WITH RADIOACTIVE SEED LOCALIZATION;  Surgeon: Ebbie Cough, MD;  Location: [facility];  Service: General;  Laterality: Left;   BREAST LUMPECTOMY WITH RADIOACTIVE SEED LOCALIZATION Left 03/14/2022   Procedure: LEFT BREAST SEED GUIDED LUMPECTOMY;  Surgeon: Ebbie Cough, MD;  Location: [facility] OR;  Service: General;  Laterality: Left;   CARDIAC CATHETERIZATION  08/27/2006   NON-STEMI/TAXUS STENTING 100% PROXIMAL LAD (X2) JANUARY 2008   CARDIAC CATHETERIZATION  08/27/2010   occluded LAD stents, Om1 : 70%, RCA: 80%, Ef 40-45%   CARPAL TUNNEL RELEASE     bilateral   CATARACT EXTRACTION W/PHACO Right 10/11/2017   Procedure: CATARACT EXTRACTION PHACO AND INTRAOCULAR LENS PLACEMENT RIGHT EYE;  Surgeon: Harrie Agent, MD;  Location: [facility];  Service: Ophthalmology;  Laterality: Right;  CDE: 5.17  CATARACT EXTRACTION W/PHACO Left 12/27/2017   Procedure: CATARACT EXTRACTION PHACO AND INTRAOCULAR LENS PLACEMENT (IOC);  Surgeon: Harrie Agent, MD;  Location: [facility];  Service: Ophthalmology;  Laterality: Left;  CDE: 2.99   CHOLECYSTECTOMY  not sure   COLONOSCOPY     CORONARY ARTERY BYPASS GRAFT  09/05/2011   Procedure: CORONARY ARTERY BYPASS GRAFTING (CABG);  Surgeon: Dallas KATHEE Jude, MD;  Location: [facility];  Service: Open Heart Surgery;  Laterality: N/A;  CABG times three using left internal mammary artery and left leg greater saphenous vein harvested endoscopically   LEG TENDON SURGERY  unknown   left    LUMBAR  LAMINECTOMY/DECOMPRESSION MICRODISCECTOMY Left 06/04/2014   Procedure: LUMBAR LAMINECTOMY/DECOMPRESSION MICRODISCECTOMY LUMBAR FOUR-FIVE;  Surgeon: Arley SHAUNNA Helling, MD;  Location: [facility];  Service: Neurosurgery;  Laterality: Left;   MASS EXCISION Left 08/08/2018   Procedure: EXCISION 5CM LIPOMA ON BACK;  Surgeon: Kallie Manuelita BROCKS, MD;  Location: [facility];  Service: General;  Laterality: Left;   TONSILLECTOMY       Medications Prior to Admission: Prior to Admission medications   Medication Sig Start Date End Date Taking? Authorizing Provider  acetaminophen  (TYLENOL ) 500 MG tablet Take 500 mg by mouth 2 (two) times daily as needed for moderate pain or headache.     [provider]  allopurinol  (ZYLOPRIM ) 100 MG tablet Take 100 mg by mouth at bedtime.    [provider]  amLODipine  (NORVASC ) 10 MG tablet TAKE 1 TABLET EVERY DAY  *DOSE  INCREASE* 12/15/15   Alvan Dorn FALCON, MD  anastrozole  (ARIMIDEX ) 1 MG tablet TAKE 1 TABLET BY MOUTH EVERY DAY 10/23/23   Rogers Hai, MD  Ascorbic Acid  (VITAMIN C ) 1000 MG tablet Take 1,000 mg by mouth daily.    [provider]  aspirin  81 MG EC tablet Take 81 mg by mouth at bedtime.    [provider]  atorvastatin  (LIPITOR) 40 MG tablet Take 1 tablet by mouth daily. 11/30/20   [provider]  chlorthalidone  (HYGROTON ) 25 MG tablet Take 12.5 mg by mouth every morning. Patient taking differently: Take 12.5 mg by mouth every other day. 02/06/24   [provider]  Cholecalciferol  (VITAMIN D ) 50 MCG (2000 UT) tablet Take 2,000 Units by mouth daily.    [provider]  colchicine  0.6 MG tablet Take one tablet PO and may repeat in one hour if no improvement. Then take one tablet every 3 hours until pain is relieved. 06/20/14   Jamelle Lorrayne HERO, NP  COMIRNATY SUSP injection  06/11/22   [provider]  fluticasone (FLONASE) 50 MCG/ACT nasal spray Place 1 spray into both nostrils as needed for  allergies or rhinitis. 05/10/20   [provider]  hydrALAZINE  (APRESOLINE ) 25 MG tablet Take 25 mg by mouth See admin instructions. Take 25 mg daily may take a second 25 mg dose as needed for high blood pressure 02/23/21 03/17/24  [provider]  lisinopril  (PRINIVIL ,ZESTRIL ) 40 MG tablet Take 1 tablet (40 mg total) by mouth daily. 10/23/11   Darron Deatrice LABOR, MD  LUMIGAN 0.01 % SOLN Place 1 drop into the right eye at bedtime. 01/30/22   [provider]  Menthol , Topical Analgesic, (ICY HOT EX) Apply 1 Application topically daily as needed (pain).    [provider]  metFORMIN  (GLUCOPHAGE ) 500 MG tablet Take 500 mg by mouth 2 (two) times daily with a meal.     [provider]  metoprolol  tartrate (LOPRESSOR ) 50 MG tablet  Take 1 tablet (50 mg total) by mouth 2 (two) times daily. 12/27/20   Alvan Dorn FALCON, MD  nitroGLYCERIN  (NITROSTAT ) 0.4 MG SL tablet Place 1 tablet (0.4 mg total) under the tongue every 5 (five) minutes x 3 doses as needed for chest pain (If no relief after 3rd dose, proceed to the ED or call 911). 01/18/23   Alvan Dorn FALCON, MD  potassium chloride  (KLOR-CON ) 10 MEQ tablet Take 10 mEq by mouth 3 (three) times a week. 02/06/24   [provider]  tiZANidine (ZANAFLEX) 4 MG tablet Take by mouth as needed. 09/17/17   [provider]  vitamin B-12 (CYANOCOBALAMIN ) 500 MCG tablet Take 500 mcg by mouth daily.    [provider]     Allergies:   No Known Allergies  Social History:   Social History   Socioeconomic History   Marital status: Widowed    Spouse name: Not on file   Number of children: 2   Years of education: Not on file   Highest education level: Not on file  Occupational History   Not on file  Tobacco Use   Smoking status: Never   Smokeless tobacco: Never  Vaping Use   Vaping status: Never Used  Substance and Sexual Activity   Alcohol  use: No    Alcohol /week: 0.0 standard drinks of alcohol     Drug use: No   Sexual activity: Not Currently    Birth control/protection: Surgical  Other Topics Concern   Not on file  Social History Narrative   Not on file   Social Drivers of Health   Financial Resource Strain: Low Risk  (07/25/2020)   Overall Financial Resource Strain (CARDIA)    Difficulty of Paying Living Expenses: Not hard at all  Food Insecurity: No Food Insecurity (05/08/2023)   Hunger Vital Sign    Worried About Running Out of Food in the Last Year: Never true    Ran Out of Food in the Last Year: Never true  Transportation Needs: No Transportation Needs (05/08/2023)   PRAPARE - Administrator, Civil Service (Medical): No    Lack of Transportation (Non-Medical): No  Physical Activity: Insufficiently Active (07/25/2020)   Exercise Vital Sign    Days of Exercise per Week: 1 day    Minutes of Exercise per Session: 30 min  Stress: No Stress Concern Present (07/25/2020)   [employer] of Occupational Health - Occupational Stress Questionnaire    Feeling of Stress : Not at all  Social Connections: Moderately Isolated (07/25/2020)   Social Connection and Isolation Panel    Frequency of Communication with Friends and Family: More than three times a week    Frequency of Social Gatherings with Friends and Family: Three times a week    Attends Religious Services: More than 4 times per year    Active Member of Clubs or Organizations: No    Attends Banker Meetings: Never    Marital Status: Widowed  Intimate Partner Violence: Not At Risk (05/08/2023)   Humiliation, Afraid, Rape, and Kick questionnaire    Fear of Current or Ex-Partner: No    Emotionally Abused: No    Physically Abused: No    Sexually Abused: No    Family History:   The patient's family history includes Dementia in her mother; Heart attack in her brother; Heart attack (age of onset: 79) in her brother; Heart attack (age of onset: 3) in her sister; Hypertension in her brother and  father; Lupus in  her brother. There is no history of Anesthesia problems, Hypotension, Malignant hyperthermia, or Pseudochol deficiency.    Review of Systems: [y] = yes, [ ]  = no    General: Weight gain [ ] ; Weight loss [ ] ; Anorexia [ ] ; Fatigue [ ] ; Fever [ ] ; Chills [ ] ; Weakness [ ]   Cardiac: Chest pain/pressure [ y]; Resting SOB [ ] ; Exertional SOB [ ] ; Orthopnea [ ] ; Pedal Edema [ ] ; Palpitations [ ] ; Syncope [ ] ; Presyncope [ ] ; Paroxysmal nocturnal dyspnea[ ]   Pulmonary: Cough [ ] ; Wheezing[ ] ; Hemoptysis[ ] ; Sputum [ ] ; Snoring [ ]   GI: Vomiting[ ] ; Dysphagia[ ] ; Melena[ ] ; Hematochezia [ ] ; Heartburn[ ] ; Abdominal pain [ ] ; Constipation [ ] ; Diarrhea [ ] ; BRBPR [ ]   GU: Hematuria[ ] ; Dysuria [ ] ; Nocturia[ ]   Vascular: Pain in legs with walking [ ] ; Pain in feet with lying flat [ ] ; Non-healing sores [ ] ; Stroke [ ] ; TIA [ ] ; Slurred speech [ ] ;  Neuro: Headaches[ ] ; Vertigo[ ] ; Seizures[ ] ; Paresthesias[ ] ;Blurred vision [ ] ; Diplopia [ ] ; Vision changes [ ]   Ortho/Skin: Arthritis [ ] ; Joint pain [ ] ; Muscle pain [ ] ; Joint swelling [ ] ; Back Pain [ ] ; Rash [ ]   Psych: Depression[ ] ; Anxiety[ ]   Heme: Bleeding problems [ ] ; Clotting disorders [ ] ; Anemia [ ]   Endocrine: Diabetes [ ] ; Thyroid  dysfunction[ ]   Physical Exam/Data:   Vitals:   05/16/24 1330 05/16/24 1400 05/16/24 1430 05/16/24 1603  BP: (!) 172/59 (!) 167/59 (!) 183/80 (!) 174/92  Pulse: (!) 56 (!) 57 63 (!) 58  Resp: (!) 21 16 (!) 24 19  Temp:    98.1 F (36.7 C)  TempSrc:    Oral  SpO2: 98% 98% 98% 97%  Weight:    89.2 kg  Height:    5' 2 (1.575 m)   No intake or output data in the 24 hours ending 05/16/24 1756 Filed Weights   05/16/24 0830 05/16/24 1603  Weight: 97.5 kg 89.2 kg   Body mass index is 35.97 kg/m.  General:  Well nourished, well developed, in no acute distress HEENT: normal Neck: JVP 12 Cardiac:  normal S1, S2; no murmur auscultated Lungs:  clear to auscultation bilaterally, no  wheezing, rhonchi or rales  Abd: soft, nontender Ext: no edema Musculoskeletal:  No deformities Skin: warm and dry  Psych:  Normal affect    EKG:  EKG obtained 05/16/2024 with NSR at a rate of 67 bpm without active ischemia. Septal QS waves are present suggesting prior septal infarct.   Relevant CV Studies: 2013 ECHO : Normal biventricular function without valvular pathology.   Laboratory Data:  Component     Latest Ref Rng 05/16/2024  WBC     4.0 - 10.5 K/uL 6.0   RBC     3.87 - 5.11 MIL/uL 3.38 (L)   Hemoglobin     12.0 - 15.0 g/dL 9.9 (L)   HCT     63.9 - 46.0 % 32.0 (L)   MCV     80.0 - 100.0 fL 94.7   MCH     26.0 - 34.0 pg 29.3   MCHC     30.0 - 36.0 g/dL 69.0   RDW     88.4 - 84.4 % 16.2 (H)   Platelets     150 - 400 K/uL 232   nRBC     0.0 - 0.2 % 0.0     Component  Latest Ref Rng 05/16/2024  WBC     4.0 - 10.5 K/uL 6.0   RBC     3.87 - 5.11 MIL/uL 3.38 (L)   Hemoglobin     12.0 - 15.0 g/dL 9.9 (L)   HCT     63.9 - 46.0 % 32.0 (L)   MCV     80.0 - 100.0 fL 94.7   MCH     26.0 - 34.0 pg 29.3   MCHC     30.0 - 36.0 g/dL 69.0   RDW     88.4 - 84.4 % 16.2 (H)   Platelets     150 - 400 K/uL 232   nRBC     0.0 - 0.2 % 0.0     Legend: (L) Low (H) High  Component     Latest Ref Rng 05/16/2024  Troponin I (High Sensitivity)     <18 ng/L 80 (H)   Troponin I (High Sensitivity)      62 (H)     Legend: (H) High  Radiology/Studies:  DG Chest Portable 1 View Result Date: 05/16/2024 CLINICAL DATA:  Chest pain for several days. Coronary artery disease. EXAM: PORTABLE CHEST 1 VIEW COMPARISON:  05/27/2014 FINDINGS: Mild cardiomegaly noted. Prior CABG also seen. Both lungs are clear. No evidence of pneumothorax or pleural effusion. IMPRESSION: Mild cardiomegaly. No active lung disease. Electronically Signed   By: Norleen DELENA Kil M.D.   On: 05/16/2024 09:13    Assessment and Plan:  Ms. Granberry has a history of CAD s/p 3 v CABG ( LIMA-LAD, SVG to OM, SVG  to acute marginal) 2013, HLD, PAD, CKD, HTN who presents to the ED with typical chest pain with a troponin elevation consistent with NSTEMI.   Suspected NSTEMI 2/2  plaque rupture  - Continue Heparin  infusion - ASA 324 mg given this morning; begin ASA 81 mg tomorrow  - Continue home atorvastatin  - She is currently hypertensive, would recommend blood pressure control.  Primary team ordered hydralazine  which will assist in conjunction with her evening metoprolol  tartrate - Lasix  20 mg IV in the AM for elevated jugular venous pressure  - Plan for angiogram on Monday  - Risk stratification with lipid panel and A1c in the morning  - She has some pain now, NTG is ordered to titrate for pain control  Normocytic Anemia: 2 point Hgb drop in the last 2 months with MCV of 94.7. Please obtain iron  labs, and monitor Hgb levels for bleeding, especially with the addition of heparin . I have ordered a repeat level   For questions or updates, please contact [facility] Please consult [email or web address] for contact info under        Signed, Merlene JAYSON Blood, MD  05/16/2024 5:56 PM   Merlene Blood, MD MS  Cardiology [employer]

## 2024-05-16 NOTE — ED Triage Notes (Signed)
 Pt states chest been hurting since Wednesday. Pt has cardiac hx., HTN, Diabetes, and is prior cancer patient. Pt endorses left arm pain but no n/v/d. No blood thinners.

## 2024-05-17 ENCOUNTER — Inpatient Hospital Stay (HOSPITAL_COMMUNITY)

## 2024-05-17 DIAGNOSIS — I214 Non-ST elevation (NSTEMI) myocardial infarction: Secondary | ICD-10-CM | POA: Diagnosis not present

## 2024-05-17 DIAGNOSIS — I200 Unstable angina: Secondary | ICD-10-CM | POA: Diagnosis not present

## 2024-05-17 LAB — IRON AND TIBC
Iron: 62 ug/dL (ref 28–170)
Saturation Ratios: 18 % (ref 10.4–31.8)
TIBC: 346 ug/dL (ref 250–450)
UIBC: 284 ug/dL

## 2024-05-17 LAB — CBC
HCT: 29.6 % — ABNORMAL LOW (ref 36.0–46.0)
Hemoglobin: 9.5 g/dL — ABNORMAL LOW (ref 12.0–15.0)
MCH: 28.6 pg (ref 26.0–34.0)
MCHC: 32.1 g/dL (ref 30.0–36.0)
MCV: 89.2 fL (ref 80.0–100.0)
Platelets: 224 K/uL (ref 150–400)
RBC: 3.32 MIL/uL — ABNORMAL LOW (ref 3.87–5.11)
RDW: 16 % — ABNORMAL HIGH (ref 11.5–15.5)
WBC: 6.5 K/uL (ref 4.0–10.5)
nRBC: 0 % (ref 0.0–0.2)

## 2024-05-17 LAB — LIPID PANEL
Cholesterol: 74 mg/dL (ref 0–200)
HDL: 23 mg/dL — ABNORMAL LOW (ref >40)
LDL Cholesterol: 32 mg/dL (ref 0–99)
Total CHOL/HDL Ratio: 3.2 ratio
Triglycerides: 95 mg/dL (ref <150)
VLDL: 19 mg/dL (ref 0–40)

## 2024-05-17 LAB — GLUCOSE, CAPILLARY
Glucose-Capillary: 117 mg/dL — ABNORMAL HIGH (ref 70–99)
Glucose-Capillary: 118 mg/dL — ABNORMAL HIGH (ref 70–99)
Glucose-Capillary: 121 mg/dL — ABNORMAL HIGH (ref 70–99)
Glucose-Capillary: 135 mg/dL — ABNORMAL HIGH (ref 70–99)

## 2024-05-17 LAB — ECHOCARDIOGRAM COMPLETE
Area-P 1/2: 2.87 cm2
Height: 62 in
S' Lateral: 2.9 cm
Weight: 3146.41 [oz_av]

## 2024-05-17 LAB — FERRITIN: Ferritin: 52 ng/mL (ref 11–307)

## 2024-05-17 LAB — BASIC METABOLIC PANEL WITH GFR
Anion gap: 13 (ref 5–15)
BUN: 20 mg/dL (ref 8–23)
CO2: 19 mmol/L — ABNORMAL LOW (ref 22–32)
Calcium: 8.8 mg/dL — ABNORMAL LOW (ref 8.9–10.3)
Chloride: 108 mmol/L (ref 98–111)
Creatinine, Ser: 1.64 mg/dL — ABNORMAL HIGH (ref 0.44–1.00)
GFR, Estimated: 32 mL/min — ABNORMAL LOW (ref >60)
Glucose, Bld: 125 mg/dL — ABNORMAL HIGH (ref 70–99)
Potassium: 3.2 mmol/L — ABNORMAL LOW (ref 3.5–5.1)
Sodium: 140 mmol/L (ref 135–145)

## 2024-05-17 LAB — MAGNESIUM: Magnesium: 1.5 mg/dL — ABNORMAL LOW (ref 1.7–2.4)

## 2024-05-17 LAB — HEMOGLOBIN A1C
Hgb A1c MFr Bld: 6.4 % — ABNORMAL HIGH (ref 4.8–5.6)
Mean Plasma Glucose: 136.98 mg/dL

## 2024-05-17 LAB — HEPARIN LEVEL (UNFRACTIONATED): Heparin Unfractionated: 0.34 [IU]/mL (ref 0.30–0.70)

## 2024-05-17 MED ORDER — POTASSIUM CHLORIDE 20 MEQ PO PACK
40.0000 meq | PACK | Freq: Once | ORAL | Status: AC
  Administered 2024-05-17: 40 meq via ORAL
  Filled 2024-05-17: qty 2

## 2024-05-17 MED ORDER — MAGNESIUM SULFATE 2 GM/50ML IV SOLN
2.0000 g | Freq: Once | INTRAVENOUS | Status: AC
  Administered 2024-05-17: 2 g via INTRAVENOUS
  Filled 2024-05-17: qty 50

## 2024-05-17 MED ORDER — SODIUM CHLORIDE 0.9 % IV SOLN: INTRAVENOUS | Status: DC

## 2024-05-17 MED ORDER — ASPIRIN 81 MG PO CHEW
81.0000 mg | CHEWABLE_TABLET | ORAL | Status: AC
  Administered 2024-05-18: 81 mg via ORAL
  Filled 2024-05-17: qty 1

## 2024-05-17 MED ORDER — PERFLUTREN LIPID MICROSPHERE
1.0000 mL | INTRAVENOUS | Status: AC | PRN
  Administered 2024-05-17: 2 mL via INTRAVENOUS

## 2024-05-17 NOTE — Plan of Care (Signed)
   Problem: Education: Goal: Knowledge of General Education information will improve Description Including pain rating scale, medication(s)/side effects and non-pharmacologic comfort measures Outcome: Progressing

## 2024-05-17 NOTE — Progress Notes (Signed)
   Progress Note  Patient Name: [name] Date of Encounter: 05/17/2024 Primary Cardiologist: Alvan Carrier, MD   Subjective   Overnight blood pressure Is still elevated. Patient notes no CP, SOB, Palpitations.  Vital Signs    Vitals:   05/16/24 2019 05/16/24 2206 05/17/24 0632 05/17/24 0747  BP: (!) 156/53 (!) 149/59 (!) 160/67 (!) 139/55  Pulse: 66 68 (!) 58 60  Resp: (!) 21 15 (!) 22 20  Temp: 98.5 F (36.9 C) 98 F (36.7 C) 98.3 F (36.8 C) 98.1 F (36.7 C)  TempSrc: Oral Oral Oral Oral  SpO2: 96% 95% 96%   Weight:      Height:        Intake/Output Summary (Last 24 hours) at 05/17/2024 0837 Last data filed at 05/17/2024 0758 Gross per 24 hour  Intake 203.52 ml  Output 400 ml  Net -196.48 ml   Filed Weights   05/16/24 0830 05/16/24 1603  Weight: 97.5 kg 89.2 kg    Physical Exam   GEN: No acute distress.   Neck: No JVD Cardiac: RRR, no murmurs, rubs, or gallops.  Respiratory: Clear to auscultation bilaterally. GI: Soft, nontender, non-distended  MS: No edema  Labs   Telemetry: SBRAD to SR, FAV noted   Chemistry Recent Labs  Lab 05/16/24 0947 05/17/24 0026  NA 141 140  K 3.6 3.2*  CL 107 108  CO2 20* 19*  GLUCOSE 135* 125*  BUN 23 20  CREATININE 1.67* 1.64*  CALCIUM  8.7* 8.8*  GFRNONAA 31* 32*  ANIONGAP 14 13     Hematology Recent Labs  Lab 05/16/24 0947 05/17/24 0026  WBC 6.0 6.5  RBC 3.38* 3.32*  HGB 9.9* 9.5*  HCT 32.0* 29.6*  MCV 94.7 89.2  MCH 29.3 28.6  MCHC 30.9 32.1  RDW 16.2* 16.0*  PLT 232 224      Assessment & Plan   NSTEMI - known CAD LIMA-LAD, SVG to OM, SVG to acute marginal - known PAD - hx of HLD and CKD 3b, GFR 32 today follows with Dr. Rachele - LDL at goal on current therapy  - in chart review no chest radiation for Breast cancer but for Non Hodkins, unclear with present documentation   Risks and benefits of cardiac catheterization have been discussed with the patient.  These include bleeding,  infection, kidney damage, stroke, heart attack, death.  The patient understands these risks and is willing to proceed.  Access recommendations: L Radial Procedural considerations: Grafts case review of last PAD assessment suggests feasibility of R fem approach - we discussed that if she has new WMA (echo ordered by Dr. Evonnie) and AKI we would discuss and use her symptom burden to decide ischemic approach, if stable GFR planned for cath Monday  Normocytic anemia - pending IM work up, iron  studies, currently reasonable for heparin      For questions or updates, please contact [facility] Please consult [email or web address] for contact info under Cardiology/STEMI.      Stanly Leavens, MD FASE [facility] Cardiologist [facility]  [address] [place], [address] [place], [place] [address] [phone]  8:37 AM

## 2024-05-17 NOTE — Progress Notes (Signed)
 PHARMACY - ANTICOAGULATION CONSULT NOTE  Pharmacy Consult for Heparin  Indication: chest pain/ACS  No Known Allergies  Patient Measurements: Height: 5' 2 (157.5 cm) Weight: 89.2 kg (196 lb 10.4 oz) IBW/kg (Calculated) : 50.1 HEPARIN  DW (KG): 70.6  Vital Signs: Temp: 97.8 F (36.6 C) (09/21 1044) Temp Source: Oral (09/21 1044) BP: 155/64 (09/21 1044) Pulse Rate: 60 (09/21 0923)  Labs: Recent Labs    05/16/24 0947 05/16/24 1208 05/16/24 1724 05/16/24 2039 05/17/24 0026 05/17/24 0719  HGB 9.9*  --   --   --  9.5*  --   HCT 32.0*  --   --   --  29.6*  --   PLT 232  --   --   --  224  --   HEPARINUNFRC  --   --   --  0.24*  --  0.34  CREATININE 1.67*  --   --   --  1.64*  --   TROPONINIHS 62* 80* 111* 119*  --   --     Estimated Creatinine Clearance: 28.8 mL/min (A) (by C-G formula based on SCr of 1.64 mg/dL (H)).   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; left breast DCIS 02/05/22   Carotid artery occlusion    Chronic kidney disease    mild, stage 1   DM (diabetes mellitus) (HCC)    takes Metformin  1000mg  BID   Dyslipidemia    takes Pravastatin  daily   Early cataracts, bilateral    Gout    takes Allopurinol  daily   Herniated disc    left   Lumbar herniated disc    Myocardial infarction [facility]) 2008   pt has 2 stents   Nocturia    Obesity    Personal history of radiation therapy    Unspecified essential hypertension    takes Metoprolol ,Lisinopril ,and Amlodipine    Urinary frequency     Medications:  See med rec  Assessment: 78 year old female, she has a  history of triple bypass, vascular disease, carotid artery disease, hypertension, hypercholesterolemia and diabetes, presents with chest pain that radiates to her left arm. Patient having some SOB and nausea. Not on oral anticoagulation. Pharmacy asked to start heparin .   HL 0.34 - therapeutic CBC stable   Goal of Therapy:  Heparin  level 0.3-0.7 units/ml Monitor platelets by anticoagulation protocol: Yes    Plan:  Continue Heparin  at 1000 units/hr Daily heparin  level and CBC while on heparin  IV. F/u cath planned for Monday   Sharyne Glatter, PharmD, BCCCP Critical Care Clinical Pharmacist 05/17/2024 2:14 PM

## 2024-05-17 NOTE — Progress Notes (Signed)
 Pt sinus brady 50s reporting no chest pain per night shift nurse stopped nitroglycerin  gtt at 0600.

## 2024-05-17 NOTE — Progress Notes (Signed)
  Echocardiogram 2D Echocardiogram has been performed.  Delicia Berens 05/17/2024, 4:24 PM

## 2024-05-17 NOTE — Progress Notes (Signed)
 PROGRESS NOTE    [name]  FMW:[id number] DOB: 20-Apr-1946 DOA: 05/16/2024 PCP: Chelsea Elspeth BRAVO, MD   Brief Narrative:    Assessment & Plan:   Principal Problem:   Unstable angina (HCC) Active Problems:   CORONARY ARTERY DISEASE, S/P PTCA   Essential hypertension   Malignant neoplasm of lower-inner quadrant of left breast in female, estrogen receptor positive (HCC)   Chronic kidney disease, stage 3b (HCC)  [name] is a 78 y/o female with history of DM2, hypertension, hyperlipidemia, coronary disease status post CABG, peripheral vascular disease, CKD stage III, and left breast cancer presenting with chest pain that started on 05/13/2024.  Presented to the ED for an ER with preliminary evaluation revealed elevated troponin 62, 88.  EKG sinus rhythm, no ST or T wave changes.  Patient was given full dose aspirin , started on IV heparin  and transferred to [facility] for further management.  Since arrival to [facility] she was initiated on IV nitroglycerin  to help with pain.   Assessment and Plan: Non-ST elevation MI with history of CABG  -Continue IV nitroglycerin  drip, IV heparin , aspirin , statin, metoprolol  -Cardiology following, possible angiogram tomorrow. -Will titrate down nitroglycerin  IV.  Patient reports chest pain 3 out of 10 today  CKD stage IIIb - Baseline creatinine 1.6-1.7.  Received IV Lasix  today. - Follow-up a.m. labs.  Iron  deficiency anemia: Hemoglobin 9.5.  Down from 11.8 on admission.  Will obtain iron  panel.  Continue IV heparin .  Monitor for any bleeding  Essential hypertension - Continue metoprolol  - Holding lisinopril  secondary to CKD - Holding chlorthalidone  - Continue hydralazine  - Patient states that she does not take amlodipine   Hypokalemia/hypomagnesemia: Replace   Mixed hyperlipidemia - Continue Lipitor - Check lipid panel   Diabetes mellitus type 2 - Holding metformin  - Check hemoglobin A1c - NovoLog  sliding scale   Left breast  cancer - Patient has finished 5 years of Arimidex  - Status postlumpectomy    Peripheral arterial disease - 03/17/2024 ABI shows bilateral severe disease - Follow-up [name]   Coronary artery disease -CABG Jan 2013: 3 vessel (LIMA to LAD, SVG to OM, SVG to acute marginal)  - She has had prior stenting - She follows [name] obesity - BMI 39.32 - Lifestyle modification    Advance Care Planning: FULL   Consults: cardiology   Family Communication: No family at the bedside  Severity of Illness: The appropriate patient status for this patient is INPATIENT. Inpatient status is judged to be reasonable and necessary in order to provide the required intensity of service to ensure the patient's safety. The patient's presenting symptoms, physical exam findings, and initial radiographic and laboratory data in the context of their chronic comorbidities is felt to place them at high risk for further clinical deterioration. Furthermore, it is not anticipated that the patient will be medically stable for discharge from the hospital within 2 midnights of admission.    * I certify that at the point of admission it is my clinical judgment that the patient will require inpatient hospital care spanning beyond 2 midnights from the point of admission due to high intensity of service, high risk for further deterioration and high frequency of surveillance required.*    Subjective:  Patient seen and examined at the bedside.  She says she had a good sleep.  When asked she says she has pain 3 out of 10.  Continues to be on IV nitroglycerin .  Vital signs have been stable.  Objective: Vitals:   05/17/24 0632 05/17/24 0747 05/17/24 0923 05/17/24 1044  BP: (!) 160/67 (!) 139/55  (!) 155/64  Pulse: (!) 58 60 60   Resp: (!) 22 20    Temp: 98.3 F (36.8 C) 98.1 F (36.7 C)  97.8 F (36.6 C)  TempSrc: Oral Oral  Oral  SpO2: 96%   99%  Weight:      Height:        Intake/Output Summary  (Last 24 hours) at 05/17/2024 1116 Last data filed at 05/17/2024 1000 Gross per 24 hour  Intake 440.52 ml  Output 400 ml  Net 40.52 ml   Filed Weights   05/16/24 0830 05/16/24 1603  Weight: 97.5 kg 89.2 kg    Examination:  General exam: Appears calm and comfortable  Respiratory system: Bilateral decreased breath sounds at bases Cardiovascular system: S1 & S2 heard, Rate controlled Gastrointestinal system: Abdomen is nondistended, soft and nontender. Normal bowel sounds heard. Extremities: No cyanosis, clubbing, edema  Central nervous system: Alert and oriented. No focal neurological deficits. Moving extremities Skin: No rashes, lesions or ulcers Psychiatry: Judgement and insight appear normal. Mood & affect appropriate.     Data Reviewed: I have personally reviewed following labs and imaging studies  CBC: Recent Labs  Lab 05/16/24 0947 05/17/24 0026  WBC 6.0 6.5  HGB 9.9* 9.5*  HCT 32.0* 29.6*  MCV 94.7 89.2  PLT 232 224   Basic Metabolic Panel: Recent Labs  Lab 05/16/24 0947 05/17/24 0026  NA 141 140  K 3.6 3.2*  CL 107 108  CO2 20* 19*  GLUCOSE 135* 125*  BUN 23 20  CREATININE 1.67* 1.64*  CALCIUM  8.7* 8.8*  MG  --  1.5*   GFR: Estimated Creatinine Clearance: 28.8 mL/min (A) (by C-G formula based on SCr of 1.64 mg/dL (H)). Liver Function Tests: No results for input(s): AST, ALT, ALKPHOS, BILITOT, PROT, ALBUMIN  in the last 168 hours. No results for input(s): LIPASE, AMYLASE in the last 168 hours. No results for input(s): AMMONIA in the last 168 hours. Coagulation Profile: No results for input(s): INR, PROTIME in the last 168 hours. Cardiac Enzymes: No results for input(s): CKTOTAL, CKMB, CKMBINDEX, TROPONINI in the last 168 hours. BNP (last 3 results) No results for input(s): PROBNP in the last 8760 hours. HbA1C: Recent Labs    05/17/24 0026  HGBA1C 6.4*   CBG: Recent Labs  Lab 05/16/24 0838 05/17/24 0623   GLUCAP 167* 117*   Lipid Profile: Recent Labs    05/17/24 0026  CHOL 74  HDL 23*  LDLCALC 32  TRIG 95  CHOLHDL 3.2   Thyroid  Function Tests: No results for input(s): TSH, T4TOTAL, FREET4, T3FREE, THYROIDAB in the last 72 hours. Anemia Panel: Recent Labs    05/17/24 0818  FERRITIN 52  TIBC 346  IRON  62   Sepsis Labs: No results for input(s): PROCALCITON, LATICACIDVEN in the last 168 hours.  Recent Results (from the past 240 hours)  MRSA Next Gen by PCR, Nasal     Status: None   Collection Time: 05/16/24  4:05 PM   Specimen: Nasal Mucosa; Nasal Swab  Result Value Ref Range Status   MRSA by PCR Next Gen NOT DETECTED NOT DETECTED Final    Comment: (NOTE) The GeneXpert MRSA Assay (FDA approved for NASAL specimens only), is one component of a comprehensive MRSA colonization surveillance program. It is not intended to diagnose MRSA infection nor to guide or monitor treatment for MRSA infections. Test performance is not FDA  approved in patients less than 9 years old. Performed at [facility], [address], [place], [place] [address]          Radiology Studies: DG Chest Portable 1 View Result Date: 05/16/2024 CLINICAL DATA:  Chest pain for several days. Coronary artery disease. EXAM: PORTABLE CHEST 1 VIEW COMPARISON:  05/27/2014 FINDINGS: Mild cardiomegaly noted. Prior CABG also seen. Both lungs are clear. No evidence of pneumothorax or pleural effusion. IMPRESSION: Mild cardiomegaly. No active lung disease. Electronically Signed   By: Norleen DELENA Kil M.D.   On: 05/16/2024 09:13        Scheduled Meds:  allopurinol   100 mg Oral QHS   vitamin C   1,000 mg Oral Daily   aspirin   81 mg Oral Daily   aspirin  EC  81 mg Oral Daily   atorvastatin   40 mg Oral Daily   atorvastatin   40 mg Oral QHS   cholecalciferol   2,000 Units Oral Daily   cyanocobalamin   500 mcg Oral Daily   hydrALAZINE   25 mg Oral Daily   insulin  aspart  0-9 Units Subcutaneous TID  WC   latanoprost   1 drop Right Eye QHS   metoprolol  tartrate  25 mg Oral BID   Continuous Infusions:  heparin  1,000 Units/hr (05/17/24 1105)   nitroGLYCERIN  3 mcg/min (05/17/24 0604)          Dezhane Staten, MD [facility] 05/17/2024, 11:16 AM

## 2024-05-18 DIAGNOSIS — I214 Non-ST elevation (NSTEMI) myocardial infarction: Secondary | ICD-10-CM

## 2024-05-18 DIAGNOSIS — I200 Unstable angina: Secondary | ICD-10-CM | POA: Diagnosis not present

## 2024-05-18 LAB — CBC
HCT: 29.3 % — ABNORMAL LOW (ref 36.0–46.0)
Hemoglobin: 9.5 g/dL — ABNORMAL LOW (ref 12.0–15.0)
MCH: 29.1 pg (ref 26.0–34.0)
MCHC: 32.4 g/dL (ref 30.0–36.0)
MCV: 89.9 fL (ref 80.0–100.0)
Platelets: 209 K/uL (ref 150–400)
RBC: 3.26 MIL/uL — ABNORMAL LOW (ref 3.87–5.11)
RDW: 16.1 % — ABNORMAL HIGH (ref 11.5–15.5)
WBC: 5.8 K/uL (ref 4.0–10.5)
nRBC: 0 % (ref 0.0–0.2)

## 2024-05-18 LAB — HEPARIN LEVEL (UNFRACTIONATED)
Heparin Unfractionated: 0.28 [IU]/mL — ABNORMAL LOW (ref 0.30–0.70)
Heparin Unfractionated: 0.24 [IU]/mL — ABNORMAL LOW (ref 0.30–0.70)

## 2024-05-18 LAB — BASIC METABOLIC PANEL WITH GFR
Anion gap: 11 (ref 5–15)
BUN: 24 mg/dL — ABNORMAL HIGH (ref 8–23)
CO2: 22 mmol/L (ref 22–32)
Calcium: 8.7 mg/dL — ABNORMAL LOW (ref 8.9–10.3)
Chloride: 106 mmol/L (ref 98–111)
Creatinine, Ser: 1.96 mg/dL — ABNORMAL HIGH (ref 0.44–1.00)
GFR, Estimated: 26 mL/min — ABNORMAL LOW (ref >60)
Glucose, Bld: 129 mg/dL — ABNORMAL HIGH (ref 70–99)
Potassium: 4.1 mmol/L (ref 3.5–5.1)
Sodium: 139 mmol/L (ref 135–145)

## 2024-05-18 LAB — GLUCOSE, CAPILLARY
Glucose-Capillary: 136 mg/dL — ABNORMAL HIGH (ref 70–99)
Glucose-Capillary: 181 mg/dL — ABNORMAL HIGH (ref 70–99)
Glucose-Capillary: 136 mg/dL — ABNORMAL HIGH (ref 70–99)
Glucose-Capillary: 139 mg/dL — ABNORMAL HIGH (ref 70–99)

## 2024-05-18 MED ORDER — SODIUM CHLORIDE 0.9 % IV SOLN: INTRAVENOUS | Status: AC

## 2024-05-18 MED ORDER — HYDROMORPHONE HCL 1 MG/ML IJ SOLN
0.5000 mg | INTRAMUSCULAR | Status: DC | PRN
Start: 2024-05-18 — End: 2024-05-22
  Administered 2024-05-18 – 2024-05-21 (×6): 0.5 mg via INTRAVENOUS
  Filled 2024-05-18 (×5): qty 0.5

## 2024-05-18 MED ORDER — HYDROCODONE-ACETAMINOPHEN 5-325 MG PO TABS
1.0000 | ORAL_TABLET | Freq: Four times a day (QID) | ORAL | Status: DC | PRN
Start: 2024-05-18 — End: 2024-05-22
  Administered 2024-05-18 – 2024-05-20 (×2): 1 via ORAL
  Filled 2024-05-18 (×2): qty 1

## 2024-05-18 NOTE — Progress Notes (Signed)
 Mobility Specialist Progress Note;   05/18/24 1030  Mobility  Activity Ambulated with assistance (bathroom)  Level of Assistance Standby assist, set-up cues, supervision of patient - no hands on  Assistive Device Front wheel walker  Distance Ambulated (ft) 15 ft  Activity Response Tolerated well  Mobility Referral Yes  Mobility visit 1 Mobility  Mobility Specialist Start Time (ACUTE ONLY) 1030  Mobility Specialist Stop Time (ACUTE ONLY) 1038  Mobility Specialist Time Calculation (min) (ACUTE ONLY) 8 min   Pt agreeable to mobility. Requested assistance to BR at [facility], void successful. Pt fatigued after trip to [facility] and deferred further mobility d/t family arriving. Pt returned back to bed and left with all needs met, call bell in reach.   Lauraine Erm Mobility Specialist Please contact via SecureChat or [employer] [phone]

## 2024-05-18 NOTE — Progress Notes (Signed)
 PROGRESS NOTE    [name]  FMW:[id number] DOB: 22-Dec-1945 DOA: 05/16/2024 PCP: Lari Elspeth BRAVO, MD   Brief Narrative:    Assessment & Plan:   Principal Problem:   Unstable angina (HCC) Active Problems:   CORONARY ARTERY DISEASE, S/P PTCA   Essential hypertension   Malignant neoplasm of lower-inner quadrant of left breast in female, estrogen receptor positive (HCC)   Chronic kidney disease, stage 3b (HCC)   Summary: [name] is a 78 y/o female with history of DM2, hypertension, hyperlipidemia, coronary disease status post CABG, peripheral vascular disease, CKD stage III, and left breast cancer presenting with chest pain that started on 05/13/2024.  Presented to the ED for an ER with preliminary evaluation revealed elevated troponin 62, 88.  EKG sinus rhythm, no ST or T wave changes.  Patient was given full dose aspirin , started on IV heparin  and transferred to [facility] for further management.  Since arrival to [facility] she was initiated on IV nitroglycerin  to help with pain. 9/22: Received Lasix  on 9/21, creatinine jumped to 1.96 from baseline around 1.6.  Plan for cath postponed.  A.m. dose of metoprolol  held due to bradycardia   Assessment and Plan: Non-ST elevation MI with history of CABG  -continue  IV heparin , aspirin , statin, metoprolol .  Titrated off nitroglycerin .  Patient is asymptomatic. -Echocardiogram shows LVEF 55 to 60%, wall motion abnormalities noted. -Cardiology following, possible angiogram tomorrow.(Postponed due to elevation in creatinine 1.96 overnight) -Will hydrate with normal saline today.  CKD stage IIIb - Baseline creatinine 1.6-1.7.  Received IV Lasix  9/21, creatinine jumped to 1.96.  IV hydration today - Follow-up a.m. labs.  Iron  deficiency anemia: Hemoglobin is stable at 9.5.  Down from 11.8 on admission.   Iron  panel: Total iron  62, UIBC 284, TIBC 346, saturation ratio 18, ferritin 52  Continue IV heparin .  Monitor for any  bleeding  Essential hypertension - Continue metoprolol  25 mg twice daily.  A.m. dose held due to bradycardia in 50s - Holding lisinopril  secondary to CKD - Holding chlorthalidone  - Continue hydralazine  - Patient states that she does not take amlodipine   Hypokalemia/hypomagnesemia: Replace   Mixed hyperlipidemia - Continue Lipitor - Check lipid panel   Diabetes mellitus type 2 - Holding metformin  - Check hemoglobin A1c - NovoLog  sliding scale   Left breast cancer - Patient has finished 5 years of Arimidex  - Status postlumpectomy    Peripheral arterial disease - 03/17/2024 ABI shows bilateral severe disease - Follow-up Dr. Magda outpatient   Coronary artery disease -CABG Jan 2013: 3 vessel (LIMA to LAD, SVG to OM, SVG to acute marginal)  - She has had prior stenting - She follows Dr. Alvan Muff II obesity - BMI 39.32 - Lifestyle modification    Advance Care Planning: FULL   Consults: cardiology   Family Communication: No family at the bedside  Severity of Illness: The appropriate patient status for this patient is INPATIENT. Inpatient status is judged to be reasonable and necessary in order to provide the required intensity of service to ensure the patient's safety. The patient's presenting symptoms, physical exam findings, and initial radiographic and laboratory data in the context of their chronic comorbidities is felt to place them at high risk for further clinical deterioration. Furthermore, it is not anticipated that the patient will be medically stable for discharge from the hospital within 2 midnights of admission.    * I certify that at the point of admission it is my clinical judgment that  the patient will require inpatient hospital care spanning beyond 2 midnights from the point of admission due to high intensity of service, high risk for further deterioration and high frequency of surveillance required.*    Subjective:  Patient seen and examined at  the bedside.  Patient denies any issues overnight.  She denies any chest pains.  She is titrated off nitroglycerin  drip.  Creatinine is up 1.96 today and therefore cath canceled today. Objective: Vitals:   05/17/24 2213 05/18/24 0020 05/18/24 0630 05/18/24 0950  BP: (!) 143/58 (!) 142/65 (!) 148/73 (!) 163/64  Pulse: 61 (!) 58 (!) 56   Resp:  18 16   Temp:  98.5 F (36.9 C) 98.1 F (36.7 C)   TempSrc:  Oral Oral   SpO2:  96% 98%   Weight:      Height:        Intake/Output Summary (Last 24 hours) at 05/18/2024 1003 Last data filed at 05/18/2024 0500 Gross per 24 hour  Intake 355.9 ml  Output 500 ml  Net -144.1 ml   Filed Weights   05/16/24 0830 05/16/24 1603  Weight: 97.5 kg 89.2 kg    Examination:  General exam: Appears calm and comfortable  Respiratory system: Bilateral decreased breath sounds at bases Cardiovascular system: S1 & S2 heard, Rate controlled Gastrointestinal system: Abdomen is nondistended, soft and nontender. Normal bowel sounds heard. Extremities: No cyanosis, clubbing, edema  Central nervous system: Alert and oriented. No focal neurological deficits. Moving extremities Skin: No rashes, lesions or ulcers Psychiatry: Judgement and insight appear normal. Mood & affect appropriate.     Data Reviewed: I have personally reviewed following labs and imaging studies  CBC: Recent Labs  Lab 05/16/24 0947 05/17/24 0026 05/18/24 0213  WBC 6.0 6.5 5.8  HGB 9.9* 9.5* 9.5*  HCT 32.0* 29.6* 29.3*  MCV 94.7 89.2 89.9  PLT 232 224 209   Basic Metabolic Panel: Recent Labs  Lab 05/16/24 0947 05/17/24 0026 05/18/24 0213  NA 141 140 139  K 3.6 3.2* 4.1  CL 107 108 106  CO2 20* 19* 22  GLUCOSE 135* 125* 129*  BUN 23 20 24*  CREATININE 1.67* 1.64* 1.96*  CALCIUM  8.7* 8.8* 8.7*  MG  --  1.5*  --    GFR: Estimated Creatinine Clearance: 24.1 mL/min (A) (by C-G formula based on SCr of 1.96 mg/dL (H)). Liver Function Tests: No results for input(s): AST,  ALT, ALKPHOS, BILITOT, PROT, ALBUMIN  in the last 168 hours. No results for input(s): LIPASE, AMYLASE in the last 168 hours. No results for input(s): AMMONIA in the last 168 hours. Coagulation Profile: No results for input(s): INR, PROTIME in the last 168 hours. Cardiac Enzymes: No results for input(s): CKTOTAL, CKMB, CKMBINDEX, TROPONINI in the last 168 hours. BNP (last 3 results) No results for input(s): PROBNP in the last 8760 hours. HbA1C: Recent Labs    05/17/24 0026  HGBA1C 6.4*   CBG: Recent Labs  Lab 05/17/24 0623 05/17/24 1131 05/17/24 1630 05/17/24 2125 05/18/24 0628  GLUCAP 117* 118* 121* 135* 136*   Lipid Profile: Recent Labs    05/17/24 0026  CHOL 74  HDL 23*  LDLCALC 32  TRIG 95  CHOLHDL 3.2   Thyroid  Function Tests: No results for input(s): TSH, T4TOTAL, FREET4, T3FREE, THYROIDAB in the last 72 hours. Anemia Panel: Recent Labs    05/17/24 0818  FERRITIN 52  TIBC 346  IRON  62   Sepsis Labs: No results for input(s): PROCALCITON, LATICACIDVEN in the last 168  hours.  Recent Results (from the past 240 hours)  MRSA Next Gen by PCR, Nasal     Status: None   Collection Time: 05/16/24  4:05 PM   Specimen: Nasal Mucosa; Nasal Swab  Result Value Ref Range Status   MRSA by PCR Next Gen NOT DETECTED NOT DETECTED Final    Comment: (NOTE) The GeneXpert MRSA Assay (FDA approved for NASAL specimens only), is one component of a comprehensive MRSA colonization surveillance program. It is not intended to diagnose MRSA infection nor to guide or monitor treatment for MRSA infections. Test performance is not FDA approved in patients less than 16 years old. Performed at [facility], [address] [place], [place] [address]          Radiology Studies: ECHOCARDIOGRAM COMPLETE Result Date: 05/17/2024    ECHOCARDIOGRAM REPORT   Patient Name:   [name] Date of Exam: 05/17/2024 Medical Rec #:  [id number]        Height:       62.0 in Accession #:    [id number]      Weight:       196.6 lb Date of Birth:  1946-08-07        BSA:          1.898 m Patient Age:    78 years        BP:           155/64 mmHg Patient Gender: F               HR:           62 bpm. Exam Location:  Inpatient Procedure: 2D Echo (Both Spectral and Color Flow Doppler were utilized during            procedure). Indications:    NSTEMI  History:        Patient has prior history of Echocardiogram examinations, most                 recent 09/03/2011. Prior CABG, chronic kidney disease. history of                 breast cancer.; Risk Factors:Diabetes.  Sonographer:    Tinnie Barefoot RDCS Referring Phys: [id number] DAVID TAT  Sonographer Comments: Image acquisition challenging due to patient body habitus. IMPRESSIONS  1. No left ventricular thrombus is seen (Definity  contrast was used). There is a small apical anterior and apical septal area of severe hypokinesis. Left ventricular ejection fraction, by estimation, is 55 to 60%. The left ventricle has normal function.  The left ventricle demonstrates regional wall motion abnormalities (see scoring diagram/findings for description). There is mild concentric left ventricular hypertrophy. Left ventricular diastolic parameters are consistent with Grade II diastolic dysfunction (pseudonormalization). Elevated left atrial pressure.  2. Right ventricular systolic function is mildly reduced. The right ventricular size is mildly enlarged. There is severely elevated pulmonary artery systolic pressure. The estimated right ventricular systolic pressure is 64.2 mmHg.  3. Left atrial size was moderately dilated.  4. Right atrial size was moderately dilated.  5. The mitral valve is normal in structure. Mild mitral valve regurgitation. No evidence of mitral stenosis.  6. The tricuspid valve is abnormal. Tricuspid valve regurgitation is moderate to severe.  7. The aortic valve is tricuspid. Aortic valve regurgitation is not  visualized. No aortic stenosis is present. Comparison(s): Prior images unable to be directly viewed, comparison made by report only. There is a new apical area of hypokinesis, consistent with  infarction in the distal LAD artery distribution. There is new evidence of increased mean left atrial pressure. FINDINGS  Left Ventricle: No left ventricular thrombus is seen (Definity  contrast was used). There is a small apical anterior and apical septal area of severe hypokinesis. Left ventricular ejection fraction, by estimation, is 55 to 60%. The left ventricle has normal function. The left ventricle demonstrates regional wall motion abnormalities. Definity  contrast agent was given IV to delineate the left ventricular endocardial borders. The left ventricular internal cavity size was normal in size. There is mild concentric left ventricular hypertrophy. Abnormal (paradoxical) septal motion consistent with post-operative status. Left ventricular diastolic parameters are consistent with Grade II diastolic dysfunction (pseudonormalization). Elevated left atrial pressure.  LV Wall Scoring: The apical septal segment, apical anterior segment, and apex are hypokinetic. The anterior wall, entire lateral wall, anterior septum, entire inferior wall, mid inferoseptal segment, and basal inferoseptal segment are normal. Right Ventricle: The right ventricular size is mildly enlarged. Right vetricular wall thickness was not well visualized. Right ventricular systolic function is mildly reduced. There is severely elevated pulmonary artery systolic pressure. The tricuspid regurgitant velocity is 3.91 m/s, and with an assumed right atrial pressure of 3 mmHg, the estimated right ventricular systolic pressure is 64.2 mmHg. Left Atrium: Left atrial size was moderately dilated. Right Atrium: Right atrial size was moderately dilated. Pericardium: There is no evidence of pericardial effusion. Mitral Valve: The mitral valve is normal in structure.  Mild mitral valve regurgitation, with centrally-directed jet. No evidence of mitral valve stenosis. Tricuspid Valve: The tricuspid valve is abnormal. Tricuspid valve regurgitation is moderate to severe. Aortic Valve: The aortic valve is tricuspid. Aortic valve regurgitation is not visualized. No aortic stenosis is present. Pulmonic Valve: The pulmonic valve was normal in structure. Pulmonic valve regurgitation is trivial. No evidence of pulmonic stenosis. Aorta: The aortic root is normal in size and structure. Venous: IVC assessment for right atrial pressure unable to be performed due to mechanical ventilation. IAS/Shunts: No atrial level shunt detected by color flow Doppler.  LEFT VENTRICLE PLAX 2D LVIDd:         4.30 cm   Diastology LVIDs:         2.90 cm   LV e' medial:    9.25 cm/s LV PW:         1.20 cm   LV E/e' medial:  12.5 LV IVS:        1.30 cm   LV e' lateral:   7.72 cm/s LVOT diam:     2.10 cm   LV E/e' lateral: 15.0 LV SV:         53 LV SV Index:   28 LVOT Area:     3.46 cm  RIGHT VENTRICLE            IVC RV Basal diam:  2.90 cm    IVC diam: 1.90 cm RV S prime:     9.90 cm/s TAPSE (M-mode): 1.6 cm LEFT ATRIUM             Index        RIGHT ATRIUM           Index LA diam:        4.10 cm 2.16 cm/m   RA Area:     17.20 cm LA Vol (A2C):   54.6 ml 28.77 ml/m  RA Volume:   47.60 ml  25.08 ml/m LA Vol (A4C):   86.2 ml 45.42 ml/m LA Biplane Vol: 76.1 ml 40.09 ml/m  AORTIC VALVE LVOT Vmax:   75.80 cm/s LVOT Vmean:  49.400 cm/s LVOT VTI:    0.154 m  AORTA Ao Root diam: 3.00 cm MITRAL VALVE                TRICUSPID VALVE MV Area (PHT): 2.87 cm     TR Peak grad:   61.2 mmHg MV Decel Time: 264 msec     TR Vmax:        391.00 cm/s MV E velocity: 116.00 cm/s MV A velocity: 36.50 cm/s   SHUNTS MV E/A ratio:  3.18         Systemic VTI:  0.15 m                             Systemic Diam: 2.10 cm Mihai Croitoru MD Electronically signed by Jerel Balding MD Signature Date/Time: 05/17/2024/5:06:16 PM    Final          Scheduled Meds:  allopurinol   100 mg Oral QHS   vitamin C   1,000 mg Oral Daily   aspirin   81 mg Oral Daily   aspirin  EC  81 mg Oral Daily   atorvastatin   40 mg Oral Daily   cholecalciferol   2,000 Units Oral Daily   cyanocobalamin   500 mcg Oral Daily   hydrALAZINE   25 mg Oral Daily   insulin  aspart  0-9 Units Subcutaneous TID WC   latanoprost   1 drop Right Eye QHS   metoprolol  tartrate  25 mg Oral BID   Continuous Infusions:  sodium chloride  50 mL/hr at 05/18/24 0950   heparin  1,050 Units/hr (05/18/24 0333)   nitroGLYCERIN  3 mcg/min (05/17/24 0604)          Derryl Duval, MD [facility] 05/18/2024, 10:03 AM

## 2024-05-18 NOTE — TOC CM/SW Note (Signed)
 Transition of Care [facility]) - Inpatient Brief Assessment   Patient Details  Name: [name] MRN: [id number] Date of Birth: August 03, 1946  [facility]) CM/SW Contact:    Lauraine FORBES Saa, LCSWA Phone Number: 05/18/2024, 9:07 AM   Clinical Narrative:  9:07 AM Per chart review, patient resides at home alone. Patient has a PCP and insurance. Patient does not have SNF or HH history. Patient has DME [facility]) history with [facility]. Patient's preferred pharmacy is [facility] [address]. No TOC needs identified at this time. TOC will continue to follow and be available to assist.  Transition of Care Asessment: Insurance and Status: Insurance coverage has been reviewed Patient has primary care physician: Yes Home environment has been reviewed: Private Residence Prior level of function:: N/A Prior/Current Home Services: No current home services Social Drivers of Health Review: SDOH reviewed no interventions necessary Readmission risk has been reviewed: Yes (Currently Yellow 20%) Transition of care needs: no transition of care needs at this time

## 2024-05-18 NOTE — Progress Notes (Signed)
 Notified by Dr. Mona that he discussed creatinine with nephrology team and they will defer cardiac cath for today. Diet order entered along with NPO after MN. Per Dr. Mona, start 50cc/hr IVF x 1 L total. Team will reassess creatinine trend in AM for cath plan. She is tentatively on the board for 10:30am tomorrow, will hold off re-entering orders pending finalized confirmation of plan to move forward tomorrow. Nurse/attending also updated of plan.

## 2024-05-18 NOTE — Plan of Care (Signed)
  Problem: Education: Goal: Knowledge of General Education information will improve Description: Including pain rating scale, medication(s)/side effects and non-pharmacologic comfort measures Outcome: Progressing   Problem: Health Behavior/Discharge Planning: Goal: Ability to manage health-related needs will improve Outcome: Progressing   Problem: Clinical Measurements: Goal: Ability to maintain clinical measurements within normal limits will improve Outcome: Progressing   Problem: Activity: Goal: Risk for activity intolerance will decrease Outcome: Progressing   Problem: Pain Managment: Goal: General experience of comfort will improve and/or be controlled Outcome: Progressing   Problem: Safety: Goal: Ability to remain free from injury will improve Outcome: Progressing   Problem: Coping: Goal: Ability to adjust to condition or change in health will improve Outcome: Progressing   Problem: Health Behavior/Discharge Planning: Goal: Ability to manage health-related needs will improve Outcome: Progressing

## 2024-05-18 NOTE — Progress Notes (Addendum)
 PHARMACY - ANTICOAGULATION CONSULT NOTE  Pharmacy Consult for Heparin  Indication: chest pain/ACS  No Known Allergies  Patient Measurements: Height: 5' 2 (157.5 cm) Weight: 89.2 kg (196 lb 10.4 oz) IBW/kg (Calculated) : 50.1 HEPARIN  DW (KG): 70.6  Vital Signs: Temp: 98.5 F (36.9 C) (09/22 0020) Temp Source: Oral (09/22 0020) BP: 142/65 (09/22 0020) Pulse Rate: 58 (09/22 0020)  Labs: Recent Labs    05/16/24 0947 05/16/24 1208 05/16/24 1724 05/16/24 2039 05/17/24 0026 05/17/24 0719 05/18/24 0213  HGB 9.9*  --   --   --  9.5*  --  9.5*  HCT 32.0*  --   --   --  29.6*  --  29.3*  PLT 232  --   --   --  224  --  209  HEPARINUNFRC  --   --   --  0.24*  --  0.34 0.28*  CREATININE 1.67*  --   --   --  1.64*  --  1.96*  TROPONINIHS 62* 80* 111* 119*  --   --   --     Estimated Creatinine Clearance: 24.1 mL/min (A) (by C-G formula based on SCr of 1.96 mg/dL (H)).   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; left breast DCIS 02/05/22   Carotid artery occlusion    Chronic kidney disease    mild, stage 1   DM (diabetes mellitus) (HCC)    takes Metformin  1000mg  BID   Dyslipidemia    takes Pravastatin  daily   Early cataracts, bilateral    Gout    takes Allopurinol  daily   Herniated disc    left   Lumbar herniated disc    Myocardial infarction [facility]) 2008   pt has 2 stents   Nocturia    Obesity    Personal history of radiation therapy    Unspecified essential hypertension    takes Metoprolol ,Lisinopril ,and Amlodipine    Urinary frequency     Medications:  See med  rec  Assessment: 78 year old female, she has a history of triple bypass, vascular disease, carotid artery disease, hypertension, hypercholesterolemia and diabetes, presents with chest pain that radiates to her left arm. Patient having some SOB and nausea. Not on oral anticoagulation. Pharmacy asked to start heparin .   HL 0.28 - subtherapeutic   Goal of Therapy:  Heparin  level 0.3-0.7 units/ml Monitor platelets by anticoagulation protocol: Yes    Plan:  Increase Heparin  to 1050 units/hr Daily heparin  level and CBC while on heparin  IV. F/u cath planned for Monday   Thank you for allowing pharmacy to be a part of this patient's care.   Bascom JAYSON Louder, PharmD 05/18/2024 3:12 AM  **Pharmacist phone directory can be found on [email or web address] listed under [facility]**

## 2024-05-18 NOTE — Progress Notes (Signed)
 PHARMACY - ANTICOAGULATION CONSULT NOTE  Pharmacy Consult for Heparin  Indication: chest pain/ACS  No Known Allergies  Patient Measurements: Height: 5' 2 (157.5 cm) Weight: 89.2 kg (196 lb 10.4 oz) IBW/kg (Calculated) : 50.1 HEPARIN  DW (KG): 70.6  Vital Signs: Temp: 98.1 F (36.7 C) (09/22 1159) Temp Source: Oral (09/22 1159) BP: 124/41 (09/22 1159) Pulse Rate: 61 (09/22 1159)  Labs: Recent Labs    05/16/24 0947 05/16/24 0947 05/16/24 1208 05/16/24 1724 05/16/24 2039 05/17/24 0026 05/17/24 0719 05/18/24 0213 05/18/24 1226  HGB 9.9*  --   --   --   --  9.5*  --  9.5*  --   HCT 32.0*  --   --   --   --  29.6*  --  29.3*  --   PLT 232  --   --   --   --  224  --  209  --   HEPARINUNFRC  --    < >  --   --  0.24*  --  0.34 0.28* 0.24*  CREATININE 1.67*  --   --   --   --  1.64*  --  1.96*  --   TROPONINIHS 62*  --  80* 111* 119*  --   --   --   --    < > = values in this interval not displayed.    Estimated Creatinine Clearance: 24.1 mL/min (A) (by C-G formula based on SCr of 1.96 mg/dL (H)).   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; left breast DCIS 02/05/22   Carotid artery occlusion    Chronic kidney disease    mild, stage 1   DM (diabetes mellitus) (HCC)    takes Metformin  1000mg  BID   Dyslipidemia    takes Pravastatin  daily   Early cataracts, bilateral    Gout    takes Allopurinol  daily   Herniated disc    left   Lumbar herniated disc    Myocardial infarction [facility]) 2008   pt has 2 stents   Nocturia    Obesity    Personal history of radiation therapy     Unspecified essential hypertension    takes Metoprolol ,Lisinopril ,and Amlodipine    Urinary frequency     Medications:  See med rec  Assessment: 78 year old female, she has a history of triple bypass, vascular disease, carotid artery disease, hypertension, hypercholesterolemia and diabetes, presents with chest pain that radiates to her left arm. Patient having some SOB and nausea. Not on oral anticoagulation. Pharmacy asked to start heparin .   HL 0.24 - subtherapeutic on 1050 units/hr   Goal of Therapy:  Heparin  level 0.3-0.7 units/ml Monitor platelets by anticoagulation protocol: Yes    Plan:  -Increase heparin  to 1200 units/hr -Heparin  level and CBC in am  Thank you for allowing pharmacy to be a part of this patient's care.

## 2024-05-18 NOTE — Progress Notes (Addendum)
   Progress Note  Patient Name: [name] Date of Encounter: 05/18/2024 Primary Cardiologist: Alvan Carrier, MD   Subjective   No chest pain overnight-  plan for LHC today via left radial approach. Echo yesterday showed LVEF 55-60% with severe apical hypokinesis. Creatinine baseline is 1.64, however, increased to 1.96 overnight.  Vital Signs    Vitals:   05/17/24 1933 05/17/24 2213 05/18/24 0020 05/18/24 0630  BP: (!) 143/58 (!) 143/58 (!) 142/65 (!) 148/73  Pulse: 61 61 (!) 58 (!) 56  Resp: 16  18 16   Temp: 98.2 F (36.8 C)  98.5 F (36.9 C) 98.1 F (36.7 C)  TempSrc: Oral  Oral Oral  SpO2: 96%  96% 98%  Weight:      Height:        Intake/Output Summary (Last 24 hours) at 05/18/2024 0820 Last data filed at 05/18/2024 0500 Gross per 24 hour  Intake 592.9 ml  Output 500 ml  Net 92.9 ml   Filed Weights   05/16/24 0830 05/16/24 1603  Weight: 97.5 kg 89.2 kg    Physical Exam   GEN: No acute distress.   Neck: No JVD Cardiac: RRR, no murmurs, rubs, or gallops.  Respiratory: Clear to auscultation bilaterally. GI: Soft, nontender, non-distended  MS: No edema  Labs   Telemetry: Sinus bradycardia   Chemistry Recent Labs  Lab 05/16/24 0947 05/17/24 0026 05/18/24 0213  NA 141 140 139  K 3.6 3.2* 4.1  CL 107 108 106  CO2 20* 19* 22  GLUCOSE 135* 125* 129*  BUN 23 20 24*  CREATININE 1.67* 1.64* 1.96*  CALCIUM  8.7* 8.8* 8.7*  GFRNONAA 31* 32* 26*  ANIONGAP 14 13 11      Hematology Recent Labs  Lab 05/16/24 0947 05/17/24 0026 05/18/24 0213  WBC 6.0 6.5 5.8  RBC 3.38* 3.32* 3.26*  HGB 9.9* 9.5* 9.5*  HCT 32.0* 29.6* 29.3*  MCV 94.7 89.2 89.9  MCH 29.3 28.6 29.1  MCHC 30.9 32.1 32.4  RDW 16.2* 16.0* 16.1*  PLT 232 224 209      Assessment & Plan   NSTEMI - known CAD LIMA-LAD, SVG to OM, SVG to acute marginal - known PAD - hx of HLD and CKD 3b, GFR 32 today follows with Dr. Rachele - LDL at goal on current therapy  - in chart review no  chest radiation for Breast cancer but for Non Hodkins, unclear with present documentation  - creatinine increased overnight to 1.96 - d/w nephrology and they would suggest hydration today and consider cath tomorrow. - ok for diet today, reschedule for tomorrow.  Access recommendations: L Radial Procedural considerations: Grafts case review of last PAD assessment suggests feasibility of R fem approach - we discussed that if she has new WMA (echo ordered by Dr. Evonnie) and AKI we would discuss and use her symptom burden to decide ischemic approach, if stable GFR planned for cath Monday  Normocytic anemia - pending IM work up, iron  studies, currently reasonable for heparin    For questions or updates, please contact [facility] Please consult [email or web address] for contact info under Cardiology/STEMI.   Vinie KYM Maxcy, MD, [facility], FNLA, FACP  [place]  [facility]  Medical Director of the [facility]  Cardiovascular Risk Reduction Clinic Diplomate of the American Board of Clinical Lipidology Attending Cardiologist  Direct Dial: [phone]  Fax: [phone]  Website:  [email or web address]  8:20 AM

## 2024-05-18 NOTE — Plan of Care (Signed)
  Problem: Education: Goal: Knowledge of General Education information will improve Description: Including pain rating scale, medication(s)/side effects and non-pharmacologic comfort measures Outcome: Progressing   Problem: Clinical Measurements: Goal: Ability to maintain clinical measurements within normal limits will improve Outcome: Progressing   Problem: Elimination: Goal: Will not experience complications related to urinary retention Outcome: Progressing   Problem: Pain Managment: Goal: General experience of comfort will improve and/or be controlled Outcome: Progressing   Problem: Cardiac: Goal: Ability to achieve and maintain adequate cardiovascular perfusion will improve Outcome: Progressing   Problem: Education: Goal: Understanding of CV disease, CV risk reduction, and recovery process will improve Outcome: Progressing

## 2024-05-19 ENCOUNTER — Encounter (HOSPITAL_COMMUNITY)
Admission: EM | Disposition: A | Discharge status: Home / Self Care | Payer: Self-pay | Source: Home / Self Care | Attending: Hospitalist

## 2024-05-19 ENCOUNTER — Telehealth (HOSPITAL_COMMUNITY): Payer: Self-pay | Admitting: Pharmacy Technician

## 2024-05-19 ENCOUNTER — Other Ambulatory Visit (HOSPITAL_COMMUNITY): Payer: Self-pay

## 2024-05-19 DIAGNOSIS — I214 Non-ST elevation (NSTEMI) myocardial infarction: Secondary | ICD-10-CM | POA: Diagnosis not present

## 2024-05-19 DIAGNOSIS — I2510 Atherosclerotic heart disease of native coronary artery without angina pectoris: Secondary | ICD-10-CM | POA: Diagnosis not present

## 2024-05-19 HISTORY — PX: LEFT HEART CATH AND CORS/GRAFTS ANGIOGRAPHY: CATH118250

## 2024-05-19 LAB — BASIC METABOLIC PANEL WITH GFR
Anion gap: 12 (ref 5–15)
BUN: 27 mg/dL — ABNORMAL HIGH (ref 8–23)
CO2: 20 mmol/L — ABNORMAL LOW (ref 22–32)
Calcium: 8.7 mg/dL — ABNORMAL LOW (ref 8.9–10.3)
Chloride: 108 mmol/L (ref 98–111)
Creatinine, Ser: 1.75 mg/dL — ABNORMAL HIGH (ref 0.44–1.00)
GFR, Estimated: 29 mL/min — ABNORMAL LOW
Glucose, Bld: 136 mg/dL — ABNORMAL HIGH (ref 70–99)
Potassium: 4.2 mmol/L (ref 3.5–5.1)
Sodium: 140 mmol/L (ref 135–145)

## 2024-05-19 LAB — CBC
HCT: 30.9 % — ABNORMAL LOW (ref 36.0–46.0)
Hemoglobin: 9.8 g/dL — ABNORMAL LOW (ref 12.0–15.0)
MCH: 29.2 pg (ref 26.0–34.0)
MCHC: 31.7 g/dL (ref 30.0–36.0)
MCV: 92 fL (ref 80.0–100.0)
Platelets: 220 K/uL (ref 150–400)
RBC: 3.36 MIL/uL — ABNORMAL LOW (ref 3.87–5.11)
RDW: 16.1 % — ABNORMAL HIGH (ref 11.5–15.5)
WBC: 5.9 K/uL (ref 4.0–10.5)
nRBC: 0 % (ref 0.0–0.2)

## 2024-05-19 LAB — GLUCOSE, CAPILLARY
Glucose-Capillary: 125 mg/dL — ABNORMAL HIGH (ref 70–99)
Glucose-Capillary: 123 mg/dL — ABNORMAL HIGH (ref 70–99)
Glucose-Capillary: 152 mg/dL — ABNORMAL HIGH (ref 70–99)
Glucose-Capillary: 119 mg/dL — ABNORMAL HIGH (ref 70–99)

## 2024-05-19 LAB — HEPARIN LEVEL (UNFRACTIONATED): Heparin Unfractionated: 0.41 [IU]/mL (ref 0.30–0.70)

## 2024-05-19 LAB — LIPOPROTEIN A (LPA): Lipoprotein (a): 245.4 nmol/L — ABNORMAL HIGH (ref <75.0)

## 2024-05-19 SURGERY — LEFT HEART CATH AND CORS/GRAFTS ANGIOGRAPHY
Anesthesia: LOCAL

## 2024-05-19 MED ORDER — SODIUM CHLORIDE 0.9 % WEIGHT BASED INFUSION
1.0000 mL/kg/h | INTRAVENOUS | Status: AC
  Administered 2024-05-19 (×2): 1 mL/kg/h via INTRAVENOUS

## 2024-05-19 MED ORDER — FENTANYL CITRATE (PF) 100 MCG/2ML IJ SOLN
INTRAMUSCULAR | Status: AC
  Filled 2024-05-19: qty 2

## 2024-05-19 MED ORDER — LIDOCAINE HCL (PF) 1 % IJ SOLN
INTRAMUSCULAR | Status: DC | PRN
  Administered 2024-05-19: 5 mL via INTRADERMAL

## 2024-05-19 MED ORDER — SODIUM CHLORIDE 0.9% FLUSH
3.0000 mL | Freq: Two times a day (BID) | INTRAVENOUS | Status: DC
  Administered 2024-05-19 – 2024-05-21 (×4): 3 mL via INTRAVENOUS

## 2024-05-19 MED ORDER — SODIUM CHLORIDE 0.9 % IV SOLN: 250.0000 mL | INTRAVENOUS | Status: AC | PRN

## 2024-05-19 MED ORDER — MIDAZOLAM HCL 2 MG/2ML IJ SOLN
INTRAMUSCULAR | Status: DC | PRN
  Administered 2024-05-19: 1 mg via INTRAVENOUS

## 2024-05-19 MED ORDER — SODIUM CHLORIDE 0.9% FLUSH: 3.0000 mL | INTRAVENOUS | Status: DC | PRN

## 2024-05-19 MED ORDER — HEPARIN SODIUM (PORCINE) 1000 UNIT/ML IJ SOLN
INTRAMUSCULAR | Status: AC
  Filled 2024-05-19: qty 10

## 2024-05-19 MED ORDER — VERAPAMIL HCL 2.5 MG/ML IV SOLN
INTRAVENOUS | Status: AC
  Filled 2024-05-19: qty 2

## 2024-05-19 MED ORDER — IOHEXOL 350 MG/ML SOLN
INTRAVENOUS | Status: DC | PRN
  Administered 2024-05-19: 50 mL

## 2024-05-19 MED ORDER — LIDOCAINE HCL (PF) 1 % IJ SOLN
INTRAMUSCULAR | Status: AC
  Filled 2024-05-19: qty 30

## 2024-05-19 MED ORDER — FENTANYL CITRATE (PF) 100 MCG/2ML IJ SOLN
INTRAMUSCULAR | Status: DC | PRN
  Administered 2024-05-19: 25 ug via INTRAVENOUS

## 2024-05-19 MED ORDER — HEPARIN SODIUM (PORCINE) 5000 UNIT/ML IJ SOLN
5000.0000 [IU] | Freq: Three times a day (TID) | INTRAMUSCULAR | Status: DC
Start: 2024-05-19 — End: 2024-05-21
  Administered 2024-05-19 – 2024-05-21 (×5): 5000 [IU] via SUBCUTANEOUS
  Filled 2024-05-19 (×5): qty 1

## 2024-05-19 MED ORDER — TICAGRELOR 90 MG PO TABS
90.0000 mg | ORAL_TABLET | Freq: Two times a day (BID) | ORAL | Status: DC
Start: 2024-05-19 — End: 2024-05-22
  Administered 2024-05-19 – 2024-05-22 (×7): 90 mg via ORAL
  Filled 2024-05-19 (×7): qty 1

## 2024-05-19 MED ORDER — HEPARIN (PORCINE) IN NACL 1000-0.9 UT/500ML-% IV SOLN
INTRAVENOUS | Status: DC | PRN
  Administered 2024-05-19 (×2): 500 mL

## 2024-05-19 MED ORDER — MIDAZOLAM HCL 2 MG/2ML IJ SOLN
INTRAMUSCULAR | Status: AC
  Filled 2024-05-19: qty 2

## 2024-05-19 MED ORDER — SODIUM CHLORIDE 0.9 % IV SOLN
INTRAVENOUS | Status: AC | PRN
  Administered 2024-05-19: 10 mL/h via INTRAVENOUS

## 2024-05-19 SURGICAL SUPPLY — 14 items
CATH INFINITI 5 FR AR1 MOD (CATHETERS) IMPLANT
CATH INFINITI 5 FR IM (CATHETERS) IMPLANT
CATH INFINITI 5 FR LCB (CATHETERS) IMPLANT
CATH INFINITI 5FR MPB2 (CATHETERS) IMPLANT
CATH INFINITI 5FR MULTPACK ANG (CATHETERS) IMPLANT
CLOSURE MYNX CONTROL 5F (Vascular Products) IMPLANT
ELECT DEFIB PAD ADLT CADENCE (PAD) IMPLANT
GLIDESHEATH SLEND A-KIT 6F 22G (SHEATH) IMPLANT
GUIDEWIRE INQWIRE 1.5J.035X260 (WIRE) IMPLANT
PACK CARDIAC CATHETERIZATION (CUSTOM PROCEDURE TRAY) ×2 IMPLANT
SET ATX-X65L (MISCELLANEOUS) IMPLANT
SHEATH PINNACLE 5F 10CM (SHEATH) IMPLANT
SHEATH PROBE COVER 6X72 (BAG) IMPLANT
WIRE MICRO SET SILHO 5FR 7 (SHEATH) IMPLANT

## 2024-05-19 NOTE — Interval H&P Note (Signed)
 History and Physical Interval Note:  05/19/2024 9:44 AM  [name]  has presented today for surgery, with the diagnosis of NSTEMI.  The various methods of treatment have been discussed with the patient and family. After consideration of risks, benefits and other options for treatment, the patient has consented to  Procedure(s): LEFT HEART CATH AND CORS/GRAFTS ANGIOGRAPHY (N/A) and possible coronary angioplasty as a surgical intervention for NSTEMI.  The patient's history has been reviewed, patient examined, no change in status, stable for surgery.  I have reviewed the patient's chart and labs.  Questions were answered to the patient's satisfaction.     Gordy Bergamo

## 2024-05-19 NOTE — Progress Notes (Signed)
 PHARMACY - ANTICOAGULATION CONSULT NOTE  Pharmacy Consult for Heparin  Indication: chest pain/ACS  No Known Allergies  Patient Measurements: Height: 5' 2 (157.5 cm) Weight: 89.2 kg (196 lb 10.4 oz) IBW/kg (Calculated) : 50.1 HEPARIN  DW (KG): 70.6  Vital Signs: Temp: 97.7 F (36.5 C) (09/23 0707) Temp Source: Oral (09/23 0707) BP: 145/65 (09/23 0905) Pulse Rate: 60 (09/23 0905)  Labs: Recent Labs    05/16/24 1208 05/16/24 1724 05/16/24 2039 05/17/24 0026 05/17/24 0719 05/18/24 0213 05/18/24 1226 05/19/24 0336  HGB  --   --   --  9.5*  --  9.5*  --  9.8*  HCT  --   --   --  29.6*  --  29.3*  --  30.9*  PLT  --   --   --  224  --  209  --  220  HEPARINUNFRC  --   --  0.24*  --    < > 0.28* 0.24* 0.41  CREATININE  --   --   --  1.64*  --  1.96*  --  1.75*  TROPONINIHS 80* 111* 119*  --   --   --   --   --    < > = values in this interval not displayed.    Estimated Creatinine Clearance: 27 mL/min (A) (by C-G formula based on SCr of 1.75 mg/dL (H)).   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; left breast DCIS 02/05/22   Carotid artery occlusion    Chronic kidney disease    mild, stage 1   DM (diabetes mellitus) (HCC)    takes Metformin  1000mg  BID   Dyslipidemia    takes Pravastatin  daily   Early cataracts, bilateral    Gout    takes Allopurinol  daily   Herniated disc    left   Lumbar herniated disc    Myocardial infarction [facility]) 2008   pt has 2 stents   Nocturia    Obesity    Personal history of radiation therapy    Unspecified essential hypertension    takes  Metoprolol ,Lisinopril ,and Amlodipine    Urinary frequency     Medications:  See med rec  Assessment: 78 year old female, she has a history of triple bypass, vascular disease, carotid artery disease, hypertension, hypercholesterolemia and diabetes, presents with chest pain that radiates to her left arm. Patient having some SOB and nausea. Not on oral anticoagulation. Pharmacy asked to start heparin .   HL 0.41 -therapeutic on 1200 units/hr   Goal of Therapy:  Heparin  level 0.3-0.7 units/ml Monitor platelets by anticoagulation protocol: Yes    Plan:  -Continue heparin  1200 units/hr -Will follow plans s/p cath  Thank you for allowing pharmacy to be a part of this patient's care.   Prentice Poisson, PharmD Clinical Pharmacist **Pharmacist phone directory can now be found on [email or web address] ([email or web address]).  Listed under [facility].

## 2024-05-19 NOTE — H&P (View-Only) (Signed)
   Progress Note  Patient Name: [name] Date of Encounter: 05/19/2024 Primary Cardiologist: Alvan Carrier, MD   Subjective   Creatinine improved overnight to 1.75 with hydration - will be able to proceed to [facility] today.  Vital Signs    Vitals:   05/18/24 2004 05/18/24 2324 05/19/24 0352 05/19/24 0707  BP: (!) 145/58 (!) 140/64 139/65 136/69  Pulse: 61 68 (!) 55 (!) 58  Resp: 17 18 20 19   Temp: 97.8 F (36.6 C) 97.6 F (36.4 C) 97.8 F (36.6 C) 97.7 F (36.5 C)  TempSrc: Oral Oral Oral Oral  SpO2: 96% 96% 98% 97%  Weight:      Height:        Intake/Output Summary (Last 24 hours) at 05/19/2024 0755 Last data filed at 05/19/2024 0400 Gross per 24 hour  Intake 1157.16 ml  Output 1 ml  Net 1156.16 ml   Filed Weights   05/16/24 0830 05/16/24 1603  Weight: 97.5 kg 89.2 kg    Physical Exam   GEN: No acute distress.   Neck: No JVD Cardiac: RRR, no murmurs, rubs, or gallops.  Respiratory: Clear to auscultation bilaterally. GI: Soft, nontender, non-distended  MS: No edema  Labs   Telemetry: Sinus bradycardia   Chemistry Recent Labs  Lab 05/17/24 0026 05/18/24 0213 05/19/24 0336  NA 140 139 140  K 3.2* 4.1 4.2  CL 108 106 108  CO2 19* 22 20*  GLUCOSE 125* 129* 136*  BUN 20 24* 27*  CREATININE 1.64* 1.96* 1.75*  CALCIUM  8.8* 8.7* 8.7*  GFRNONAA 32* 26* 29*  ANIONGAP 13 11 12      Hematology Recent Labs  Lab 05/17/24 0026 05/18/24 0213 05/19/24 0336  WBC 6.5 5.8 5.9  RBC 3.32* 3.26* 3.36*  HGB 9.5* 9.5* 9.8*  HCT 29.6* 29.3* 30.9*  MCV 89.2 89.9 92.0  MCH 28.6 29.1 29.2  MCHC 32.1 32.4 31.7  RDW 16.0* 16.1* 16.1*  PLT 224 209 220      Assessment & Plan   NSTEMI - known CAD LIMA-LAD, SVG to OM, SVG to acute marginal - known PAD - hx of HLD and CKD 3b, GFR 32 today follows with Dr. Rachele - LDL at goal on current therapy  - in chart review no chest radiation for Breast cancer but for Non Hodkins, unclear with present  documentation  - creatinine improved overnight- ok to proceed with LHC today.  Access recommendations: L Radial Procedural considerations: Grafts case review of last PAD assessment suggests feasibility of R fem approach - we discussed that if she has new WMA (echo ordered by Dr. Evonnie) and AKI we would discuss and use her symptom burden to decide ischemic approach, if stable GFR planned for cath Monday  Normocytic anemia - pending IM work up, iron  studies, currently reasonable for heparin  - H/H stable on heparin    For questions or updates, please contact [facility] Please consult [email or web address] for contact info under Cardiology/STEMI.   Vinie KYM Maxcy, MD, [facility], FNLA, FACP    [facility]  Medical Director of the [facility] Lip[facility] Disorders &  Cardiovascular Risk Reduction Clinic Diplomate of the American Board of Clinical Lipidology Attending Cardiologist  Direct Dial: [phone]  Fax: [phone]  Website:  [email or web address]  7:55 AM

## 2024-05-19 NOTE — Care Management Obs Status (Signed)
 MEDICARE OBSERVATION STATUS NOTIFICATION   Patient Details  Name: [name] MRN: [id number] Date of Birth: 03/10/1946   Medicare Observation Status Notification Given:     [id number]   [name] 05/19/2024, 2:34 PM

## 2024-05-19 NOTE — Plan of Care (Signed)
  Problem: Education: Goal: Knowledge of General Education information will improve Description: Including pain rating scale, medication(s)/side effects and non-pharmacologic comfort measures Outcome: Progressing   Problem: Health Behavior/Discharge Planning: Goal: Ability to manage health-related needs will improve Outcome: Progressing   Problem: Clinical Measurements: Goal: Ability to maintain clinical measurements within normal limits will improve Outcome: Progressing   Problem: Nutrition: Goal: Adequate nutrition will be maintained Outcome: Progressing   Problem: Pain Managment: Goal: General experience of comfort will improve and/or be controlled Outcome: Progressing

## 2024-05-19 NOTE — Progress Notes (Signed)
 PROGRESS NOTE    [name]  FMW:[id number] DOB: [birth year] DOA: 05/16/2024 PCP: Lari Elspeth BRAVO, MD   Brief Narrative:    Assessment & Plan:   Principal Problem:   Unstable angina (HCC) Active Problems:   CORONARY ARTERY DISEASE, S/P PTCA   Essential hypertension   Malignant neoplasm of lower-inner quadrant of left breast in female, estrogen receptor positive (HCC)   Chronic kidney disease, stage 3b (HCC)   Summary: [name] is a 78 y/o female with history of DM2, hypertension, hyperlipidemia, coronary disease status post CABG, peripheral vascular disease, CKD stage III, and left breast cancer presenting with chest pain that started on 05/13/2024.  Presented to the ED for an ER with preliminary evaluation revealed elevated troponin 62, 88.  EKG sinus rhythm, no ST or T wave changes.  Patient was given full dose aspirin , started on IV heparin  and transferred to [facility] for further management.  Since arrival to [facility] she was initiated on IV nitroglycerin  to help with pain. 9/22: Received Lasix  on 9/21, creatinine jumped to 1.96 from baseline around 1.6.  Plan for cath postponed.  A.m. dose of metoprolol  held due to bradycardia 9/23; LHC   Assessment and Plan: Non-ST elevation MI with past history of CABG  -continue  aspirin , statin, metoprolol .  Titrated off nitroglycerin .   -Left heart catheterization 9/23: Notes reviewed.  SVG to RCA 80% stenosis, cardiology recommending staged intervention to SVG to RCA.  Timing will depend upon renal function. -Echocardiogram shows LVEF 55 to 60%, wall motion abnormalities noted. -continue Iv hydration with normal saline today.  CKD stage IIIb - Baseline creatinine 1.6-1.7.  Received IV Lasix  9/21, creatinine jumped to 1.96.  Improved to 1.74 with IV fluid.  Will monitor renal status closely  Iron  deficiency anemia: Hemoglobin is stable at 9.5.  Down from 11.8 on admission.   Iron  panel: Total iron  62, UIBC 284, TIBC 346,  saturation ratio 18, ferritin 52 Received IV heparin .  Monitor for any bleeding  Essential hypertension - Continue metoprolol  25 mg twice daily.   - Holding lisinopril  secondary to CKD - Holding chlorthalidone  - Continue hydralazine  Not on amlodipine   Hypokalemia/hypomagnesemia: Replace as needed   Mixed hyperlipidemia - Continue Lipitor - Check lipid panel   Diabetes mellitus type 2 - Holding metformin  - Check hemoglobin A1c - NovoLog  sliding scale   Left breast cancer - Patient has finished 5 years of Arimidex  - Status postlumpectomy    Peripheral arterial disease - 03/17/2024 ABI shows bilateral severe disease - Follow-up Dr. Magda outpatient   Coronary artery disease -CABG Jan 2013: 3 vessel (LIMA to LAD, SVG to OM, SVG to acute marginal)  - She has had prior stenting - She follows Dr. Alvan Muff II obesity - BMI 39.32 - Lifestyle modification    Advance Care Planning: FULL   Consults: cardiology   Family Communication: No family at the bedside  Severity of Illness: The appropriate patient status for this patient is INPATIENT. Inpatient status is judged to be reasonable and necessary in order to provide the required intensity of service to ensure the patient's safety. The patient's presenting symptoms, physical exam findings, and initial radiographic and laboratory data in the context of their chronic comorbidities is felt to place them at high risk for further clinical deterioration. Furthermore, it is not anticipated that the patient will be medically stable for discharge from the hospital within 2 midnights of admission.    * I certify that at the point  of admission it is my clinical judgment that the patient will require inpatient hospital care spanning beyond 2 midnights from the point of admission due to high intensity of service, high risk for further deterioration and high frequency of surveillance required.*    Subjective:  Patient seen and  examined at the bedside.  Family members were present.  Patient underwent left heart cath today, stenosis of SVG to RCA but no intervention completed.  Cardiology recommending staged intervention to SVG to RCA using AR-1 guide catheter.  Patient reports of left arm pain which is intermittent.  She denies any chest pain.  Yesterday she did ambulate in the hallways and became tachycardic.    Objective: Vitals:   05/19/24 1130 05/19/24 1150 05/19/24 1200 05/19/24 1638  BP: (!) 133/120 (!) 139/55 (!) 143/58   Pulse: (!) 58 (!) 57 (!) 56 67  Resp: (!) 21 17 19 20   Temp:  97.6 F (36.4 C)  98.3 F (36.8 C)  TempSrc:  Oral  Oral  SpO2: 98% 97% 100% 100%  Weight:      Height:        Intake/Output Summary (Last 24 hours) at 05/19/2024 1658 Last data filed at 05/19/2024 0400 Gross per 24 hour  Intake 1157.16 ml  Output 1 ml  Net 1156.16 ml   Filed Weights   05/16/24 0830 05/16/24 1603  Weight: 97.5 kg 89.2 kg    Examination:  General exam: Appears calm and comfortable  Respiratory system: Bilateral decreased breath sounds at bases Cardiovascular system: S1 & S2 heard, Rate controlled Gastrointestinal system: Abdomen is nondistended, soft and nontender. Normal bowel sounds heard. Extremities: No cyanosis, clubbing, edema  Central nervous system: Alert and oriented. No focal neurological deficits. Moving extremities Skin: No rashes, lesions or ulcers Psychiatry: Judgement and insight appear normal. Mood & affect appropriate.     Data Reviewed: I have personally reviewed following labs and imaging studies  CBC: Recent Labs  Lab 05/16/24 0947 05/17/24 0026 05/18/24 0213 05/19/24 0336  WBC 6.0 6.5 5.8 5.9  HGB 9.9* 9.5* 9.5* 9.8*  HCT 32.0* 29.6* 29.3* 30.9*  MCV 94.7 89.2 89.9 92.0  PLT 232 224 209 220   Basic Metabolic Panel: Recent Labs  Lab 05/16/24 0947 05/17/24 0026 05/18/24 0213 05/19/24 0336  NA 141 140 139 140  K 3.6 3.2* 4.1 4.2  CL 107 108 106 108  CO2 20*  19* 22 20*  GLUCOSE 135* 125* 129* 136*  BUN 23 20 24* 27*  CREATININE 1.67* 1.64* 1.96* 1.75*  CALCIUM  8.7* 8.8* 8.7* 8.7*  MG  --  1.5*  --   --    GFR: Estimated Creatinine Clearance: 27 mL/min (A) (by C-G formula based on SCr of 1.75 mg/dL (H)). Liver Function Tests: No results for input(s): AST, ALT, ALKPHOS, BILITOT, PROT, ALBUMIN  in the last 168 hours. No results for input(s): LIPASE, AMYLASE in the last 168 hours. No results for input(s): AMMONIA in the last 168 hours. Coagulation Profile: No results for input(s): INR, PROTIME in the last 168 hours. Cardiac Enzymes: No results for input(s): CKTOTAL, CKMB, CKMBINDEX, TROPONINI in the last 168 hours. BNP (last 3 results) No results for input(s): PROBNP in the last 8760 hours. HbA1C: Recent Labs    05/17/24 0026  HGBA1C 6.4*   CBG: Recent Labs  Lab 05/18/24 1640 05/18/24 2106 05/19/24 0606 05/19/24 1133 05/19/24 1636  GLUCAP 136* 139* 125* 123* 152*   Lipid Profile: Recent Labs    05/17/24 0026  CHOL 74  HDL 23*  LDLCALC 32  TRIG 95  CHOLHDL 3.2   Thyroid  Function Tests: No results for input(s): TSH, T4TOTAL, FREET4, T3FREE, THYROIDAB in the last 72 hours. Anemia Panel: Recent Labs    05/17/24 0818  FERRITIN 52  TIBC 346  IRON  62   Sepsis Labs: No results for input(s): PROCALCITON, LATICACIDVEN in the last 168 hours.  Recent Results (from the past 240 hours)  MRSA Next Gen by PCR, Nasal     Status: None   Collection Time: 05/16/24  4:05 PM   Specimen: Nasal Mucosa; Nasal Swab  Result Value Ref Range Status   MRSA by PCR Next Gen NOT DETECTED NOT DETECTED Final    Comment: (NOTE) The GeneXpert MRSA Assay (FDA approved for NASAL specimens only), is one component of a comprehensive MRSA colonization surveillance program. It is not intended to diagnose MRSA infection nor to guide or monitor treatment for MRSA infections. Test performance is not FDA  approved in patients less than 73 years old. Performed at [facility], [address], [place], [place] [address]          Radiology Studies: CARDIAC CATHETERIZATION Result Date: 05/19/2024 Images from the original result were not included. Cardiac Catheterization 05/19/24: Hemodynamic data: LV 171/1, EDP 17 mmHg.  Ao 179/86, mean 108 mmHg.  There is no pressure gradient across the aortic valve. Angiographic data: LM: Calcified but widely patent. LAD: Has a long proximal LAD stent that is occluded.  Distal LAD is supplied by LIMA. LCx: Very large vessel, has a long calcific CTO in the proximal and mid segment with bridging collaterals and also faint collaterals from marginals to the distal Cx.  There are 2 large marginals which are grafted. RCA: Difficult to engage, tortuous and unfolding of the aorta.  The RCA in the proximal segment has tandem 80 to 90% stenosis followed by mild disease distally, has early bifurcation into large PDA and PL branches which are supplied by vein graft. LIMA to LAD: Widely patent. SVG to OM 1: Widely patent.  OM1 also has a inferior branch which is fairly large. SVG to RCA: Jump graft of SVG to acute marginal and PDA has proximal/ostial aborted 20 to 23 mm 80% stenosis, distal graft is healthy. Impression and recommendations: In view of recent acute renal failure, chronic renal insufficiency, I utilized 50 cc of contrast to perform diagnostic angiography, would recommend staged intervention to SVG to RCA using AR-1 guide catheter.  Timing will depend upon her renal function, discussed with Dr. Mona.        Scheduled Meds:  allopurinol   100 mg Oral QHS   vitamin C   1,000 mg Oral Daily   aspirin   81 mg Oral Daily   atorvastatin   40 mg Oral Daily   cholecalciferol   2,000 Units Oral Daily   cyanocobalamin   500 mcg Oral Daily   heparin   5,000 Units Subcutaneous Q8H   hydrALAZINE   25 mg Oral Daily   insulin  aspart  0-9 Units Subcutaneous TID WC    latanoprost   1 drop Right Eye QHS   metoprolol  tartrate  25 mg Oral BID   sodium chloride  flush  3 mL Intravenous Q12H   ticagrelor   90 mg Oral BID   Continuous Infusions:  sodium chloride      sodium chloride  1 mL/kg/hr (05/19/24 1413)   nitroGLYCERIN  Stopped (05/17/24 0606)          Derryl Duval, MD [facility] 05/19/2024, 4:58 PM

## 2024-05-19 NOTE — Progress Notes (Signed)
   Progress Note  Patient Name: [name] Date of Encounter: 05/19/2024 Primary Cardiologist: Alvan Carrier, MD   Subjective   Creatinine improved overnight to 1.75 with hydration - will be able to proceed to [facility] today.  Vital Signs    Vitals:   05/18/24 2004 05/18/24 2324 05/19/24 0352 05/19/24 0707  BP: (!) 145/58 (!) 140/64 139/65 136/69  Pulse: 61 68 (!) 55 (!) 58  Resp: 17 18 20 19   Temp: 97.8 F (36.6 C) 97.6 F (36.4 C) 97.8 F (36.6 C) 97.7 F (36.5 C)  TempSrc: Oral Oral Oral Oral  SpO2: 96% 96% 98% 97%  Weight:      Height:        Intake/Output Summary (Last 24 hours) at 05/19/2024 0755 Last data filed at 05/19/2024 0400 Gross per 24 hour  Intake 1157.16 ml  Output 1 ml  Net 1156.16 ml   Filed Weights   05/16/24 0830 05/16/24 1603  Weight: 97.5 kg 89.2 kg    Physical Exam   GEN: No acute distress.   Neck: No JVD Cardiac: RRR, no murmurs, rubs, or gallops.  Respiratory: Clear to auscultation bilaterally. GI: Soft, nontender, non-distended  MS: No edema  Labs   Telemetry: Sinus bradycardia   Chemistry Recent Labs  Lab 05/17/24 0026 05/18/24 0213 05/19/24 0336  NA 140 139 140  K 3.2* 4.1 4.2  CL 108 106 108  CO2 19* 22 20*  GLUCOSE 125* 129* 136*  BUN 20 24* 27*  CREATININE 1.64* 1.96* 1.75*  CALCIUM  8.8* 8.7* 8.7*  GFRNONAA 32* 26* 29*  ANIONGAP 13 11 12      Hematology Recent Labs  Lab 05/17/24 0026 05/18/24 0213 05/19/24 0336  WBC 6.5 5.8 5.9  RBC 3.32* 3.26* 3.36*  HGB 9.5* 9.5* 9.8*  HCT 29.6* 29.3* 30.9*  MCV 89.2 89.9 92.0  MCH 28.6 29.1 29.2  MCHC 32.1 32.4 31.7  RDW 16.0* 16.1* 16.1*  PLT 224 209 220      Assessment & Plan   NSTEMI - known CAD LIMA-LAD, SVG to OM, SVG to acute marginal - known PAD - hx of HLD and CKD 3b, GFR 32 today follows with Dr. Rachele - LDL at goal on current therapy  - in chart review no chest radiation for Breast cancer but for Non Hodkins, unclear with present  documentation  - creatinine improved overnight- ok to proceed with LHC today.  Access recommendations: L Radial Procedural considerations: Grafts case review of last PAD assessment suggests feasibility of R fem approach - we discussed that if she has new WMA (echo ordered by Dr. Evonnie) and AKI we would discuss and use her symptom burden to decide ischemic approach, if stable GFR planned for cath Monday  Normocytic anemia - pending IM work up, iron  studies, currently reasonable for heparin  - H/H stable on heparin    For questions or updates, please contact [facility] Please consult [email or web address] for contact info under Cardiology/STEMI.   Vinie KYM Maxcy, MD, [facility], FNLA, FACP    [facility]  Medical Director of the Advanced Lipid Disorders &  Cardiovascular Risk Reduction Clinic Diplomate of the American Board of Clinical Lipidology Attending Cardiologist  Direct Dial: [phone]  Fax: [phone]  Website:  [email or web address]  7:55 AM

## 2024-05-19 NOTE — Telephone Encounter (Signed)
 Patient Product/process development scientist completed.    The patient is insured through [employer]. Patient has Medicare and is not eligible for a copay card, but may be able to apply for patient assistance or Medicare RX Payment Plan (Patient Must reach out to their plan, if eligible for payment plan), if available.    Ran test claim for ticagrelor  (Brilinta ) 90 mg and the current 30 day co-pay is $0.00.   This test claim was processed through [facility]- copay amounts may vary at other pharmacies due to pharmacy/plan contracts, or as the patient moves through the different stages of their insurance plan.     Reyes Sharps, CPHT Pharmacy Technician III Certified Patient Advocate [facility] Patient Advocate Team Direct Number: [phone]  Fax: [phone]

## 2024-05-19 NOTE — Care Management Important Message (Signed)
 Important Message  Patient Details  Name: [name] MRN: [id number] Date of Birth: [birth year]   Important Message Given:  Yes - Medicare IM     Claretta Deed 05/19/2024, 2:36 PM

## 2024-05-20 ENCOUNTER — Encounter (HOSPITAL_COMMUNITY): Payer: Self-pay | Admitting: Cardiology

## 2024-05-20 DIAGNOSIS — I200 Unstable angina: Secondary | ICD-10-CM | POA: Diagnosis not present

## 2024-05-20 LAB — BASIC METABOLIC PANEL WITH GFR
Anion gap: 11 (ref 5–15)
BUN: 25 mg/dL — ABNORMAL HIGH (ref 8–23)
CO2: 19 mmol/L — ABNORMAL LOW (ref 22–32)
Calcium: 8.7 mg/dL — ABNORMAL LOW (ref 8.9–10.3)
Chloride: 109 mmol/L (ref 98–111)
Creatinine, Ser: 1.77 mg/dL — ABNORMAL HIGH (ref 0.44–1.00)
GFR, Estimated: 29 mL/min — ABNORMAL LOW (ref >60)
Glucose, Bld: 125 mg/dL — ABNORMAL HIGH (ref 70–99)
Potassium: 4.1 mmol/L (ref 3.5–5.1)
Sodium: 139 mmol/L (ref 135–145)

## 2024-05-20 LAB — GLUCOSE, CAPILLARY
Glucose-Capillary: 116 mg/dL — ABNORMAL HIGH (ref 70–99)
Glucose-Capillary: 170 mg/dL — ABNORMAL HIGH (ref 70–99)
Glucose-Capillary: 114 mg/dL — ABNORMAL HIGH (ref 70–99)
Glucose-Capillary: 133 mg/dL — ABNORMAL HIGH (ref 70–99)

## 2024-05-20 MED ORDER — SODIUM CHLORIDE 0.9 % WEIGHT BASED INFUSION
1.0000 mL/kg/h | INTRAVENOUS | Status: AC
  Administered 2024-05-20: 1 mL/kg/h via INTRAVENOUS

## 2024-05-20 MED ORDER — HYDRALAZINE HCL 25 MG PO TABS
25.0000 mg | ORAL_TABLET | Freq: Two times a day (BID) | ORAL | Status: DC
Start: 2024-05-20 — End: 2024-05-22
  Administered 2024-05-20 – 2024-05-22 (×3): 25 mg via ORAL
  Filled 2024-05-20 (×3): qty 1

## 2024-05-20 NOTE — Progress Notes (Addendum)
 PROGRESS NOTE    [name]  FMW:[id number] DOB: Mar 14, 1946 DOA: 05/16/2024 PCP: Lari Elspeth BRAVO, MD   Brief Narrative:    78 y/o female with history of DM2, hypertension, hyperlipidemia, coronary disease status post CABG, peripheral vascular disease, CKD stage III, and left breast cancer presenting with chest pain that started on 05/13/2024.  Presented to the ED for an ER with preliminary evaluation revealed elevated troponin 62, 88.  EKG sinus rhythm, no ST or T wave changes.  Patient was given full dose aspirin , started on IV heparin  and transferred to [facility] for further management.  Since arrival to [facility], she was initiated on IV nitroglycerin  to help with pain. Off of nitroglycerin  drip now.  S/p Cardiac cath on 9/23 Plan for PCI of SVG to be done on 9/25.    Assessment & Plan:  Principal Problem:   Unstable angina (HCC) Active Problems:   CORONARY ARTERY DISEASE, S/P PTCA   Essential hypertension   Malignant neoplasm of lower-inner quadrant of left breast in female, estrogen receptor positive (HCC)   Chronic kidney disease, stage 3b (HCC)    Non-ST elevation MI,POA -in the setting of prior CABG (2013)   -continue  aspirin , statin, metoprolol .  Titrated off nitroglycerin .   -Left heart catheterization 9/23: Notes reviewed.  SVG to RCA 80% stenosis Plan for staged PCI to SVG on 9/25. -Echocardiogram shows LVEF 55 to 60%, wall motion abnormalities noted. -continue IV hydration with normal saline today. -continue with aspirin , statin and beta blockers. -She does have highly elevated lipoprotein a level.   CKD stage IIIb - Baseline creatinine 1.6-1.7.  Received IV Lasix  9/21, creatinine jumped to 1.96.  Improved to 1.74 with IV fluid.  Will monitor renal status closely   Iron  deficiency anemia: Hemoglobin is stable at 9.5.  Down from 11.8 on admission.   Iron  panel: Total iron  62, UIBC 284, TIBC 346, saturation ratio 18, ferritin 52 Received IV heparin .  Monitor for  any bleeding   Essential hypertension - Continue metoprolol  25 mg twice daily.   - Holding lisinopril  secondary to CKD - Holding chlorthalidone  - Continue hydralazine  Not on amlodipine    Hypokalemia/hypomagnesemia: Replace as needed   Mixed hyperlipidemia - Continue Lipitor - Check lipid panel   Diabetes mellitus type 2 - Holding metformin  - hemoglobin A1c is 6.4% - NovoLog  sliding scale   Left breast cancer - Patient has finished 5 years of Arimidex  - Status postlumpectomy    Peripheral arterial disease - 03/17/2024 ABI shows bilateral severe disease - Follow-up Dr. Magda outpatient   Coronary artery disease -CABG Jan 2013: 3 vessel (LIMA to LAD, SVG to OM, SVG to acute marginal)  - She has had prior stenting - She follows Dr. Alvan Muff II obesity: - She is a candidate for AOM based on her BMI and obesity related comorbidities.  DVT prophylaxis: heparin  injection 5,000 Units Start: 05/19/24 2200     Code Status: Full Code Family Communication:  None at the bedside Status is: Inpatient Remains inpatient appropriate because: NSTEMI, Needs cardiac cath    Subjective:  Denies chest pain or shortness of breath. She lives at home by herself and is IADL. She does have two daughters who help her, if needed.  Examination:  General exam: Appears calm and comfortable  Respiratory system: Clear to auscultation. Respiratory effort normal. Cardiovascular system: S1 & S2 heard, RRR. No JVD, murmurs, rubs, gallops or clicks. No pedal edema. Gastrointestinal system: Abdomen is nondistended, soft and nontender. No  organomegaly or masses felt. Normal bowel sounds heard. Central nervous system: Alert and oriented. No focal neurological deficits. Extremities: Symmetric 5 x 5 power. Skin: No rashes, lesions or ulcers Psychiatry: Judgement and insight appear normal. Mood & affect appropriate.       Diet Orders (From admission, onward)     Start     Ordered    05/19/24 1058  Diet Carb Modified Fluid consistency: Thin; Room service appropriate? Yes  Diet effective now       Question Answer Comment  Diet-HS Snack? Nothing   Calorie Level Medium 1600-2000   Fluid consistency: Thin   Room service appropriate? Yes      05/19/24 1058            Objective: Vitals:   05/19/24 2033 05/19/24 2324 05/20/24 0340 05/20/24 0628  BP: (!) 175/71 (!) 162/76 (!) 170/89 (!) 172/76  Pulse: 71 60 66 70  Resp: 20 16 19 20   Temp: 98.4 F (36.9 C) 98.4 F (36.9 C) 98.7 F (37.1 C) 98.3 F (36.8 C)  TempSrc: Oral Oral Oral Oral  SpO2: 99% 99% 100% 100%  Weight:      Height:        Intake/Output Summary (Last 24 hours) at 05/20/2024 1149 Last data filed at 05/19/2024 2233 Gross per 24 hour  Intake 1164.71 ml  Output --  Net 1164.71 ml   Filed Weights   05/16/24 0830 05/16/24 1603  Weight: 97.5 kg 89.2 kg    Scheduled Meds:  allopurinol   100 mg Oral QHS   vitamin C   1,000 mg Oral Daily   aspirin   81 mg Oral Daily   atorvastatin   40 mg Oral Daily   cholecalciferol   2,000 Units Oral Daily   cyanocobalamin   500 mcg Oral Daily   heparin   5,000 Units Subcutaneous Q8H   hydrALAZINE   25 mg Oral Daily   insulin  aspart  0-9 Units Subcutaneous TID WC   latanoprost   1 drop Right Eye QHS   metoprolol  tartrate  25 mg Oral BID   sodium chloride  flush  3 mL Intravenous Q12H   ticagrelor   90 mg Oral BID   Continuous Infusions:  sodium chloride      nitroGLYCERIN  Stopped (05/17/24 0606)    Nutritional status     Body mass index is 35.97 kg/m.  Data Reviewed:   CBC: Recent Labs  Lab 05/16/24 0947 05/17/24 0026 05/18/24 0213 05/19/24 0336  WBC 6.0 6.5 5.8 5.9  HGB 9.9* 9.5* 9.5* 9.8*  HCT 32.0* 29.6* 29.3* 30.9*  MCV 94.7 89.2 89.9 92.0  PLT 232 224 209 220   Basic Metabolic Panel: Recent Labs  Lab 05/16/24 0947 05/17/24 0026 05/18/24 0213 05/19/24 0336 05/20/24 0355  NA 141 140 139 140 139  K 3.6 3.2* 4.1 4.2 4.1  CL 107 108  106 108 109  CO2 20* 19* 22 20* 19*  GLUCOSE 135* 125* 129* 136* 125*  BUN 23 20 24* 27* 25*  CREATININE 1.67* 1.64* 1.96* 1.75* 1.77*  CALCIUM  8.7* 8.8* 8.7* 8.7* 8.7*  MG  --  1.5*  --   --   --    GFR: Estimated Creatinine Clearance: 26.7 mL/min (A) (by C-G formula based on SCr of 1.77 mg/dL (H)). Liver Function Tests: No results for input(s): AST, ALT, ALKPHOS, BILITOT, PROT, ALBUMIN  in the last 168 hours. No results for input(s): LIPASE, AMYLASE in the last 168 hours. No results for input(s): AMMONIA in the last 168 hours. Coagulation Profile: No results for  input(s): INR, PROTIME in the last 168 hours. Cardiac Enzymes: No results for input(s): CKTOTAL, CKMB, CKMBINDEX, TROPONINI in the last 168 hours. BNP (last 3 results) No results for input(s): PROBNP in the last 8760 hours. HbA1C: No results for input(s): HGBA1C in the last 72 hours. CBG: Recent Labs  Lab 05/19/24 0606 05/19/24 1133 05/19/24 1636 05/19/24 2110 05/20/24 0625  GLUCAP 125* 123* 152* 119* 116*   Lipid Profile: No results for input(s): CHOL, HDL, LDLCALC, TRIG, CHOLHDL, LDLDIRECT in the last 72 hours. Thyroid  Function Tests: No results for input(s): TSH, T4TOTAL, FREET4, T3FREE, THYROIDAB in the last 72 hours. Anemia Panel: No results for input(s): VITAMINB12, FOLATE, FERRITIN, TIBC, IRON , RETICCTPCT in the last 72 hours. Sepsis Labs: No results for input(s): PROCALCITON, LATICACIDVEN in the last 168 hours.  Recent Results (from the past 240 hours)  MRSA Next Gen by PCR, Nasal     Status: None   Collection Time: 05/16/24  4:05 PM   Specimen: Nasal Mucosa; Nasal Swab  Result Value Ref Range Status   MRSA by PCR Next Gen NOT DETECTED NOT DETECTED Final    Comment: (NOTE) The GeneXpert MRSA Assay (FDA approved for NASAL specimens only), is one component of a comprehensive MRSA colonization surveillance program. It is not  intended to diagnose MRSA infection nor to guide or monitor treatment for MRSA infections. Test performance is not FDA approved in patients less than 64 years old. Performed at [facility], [address], [place], [place] [address]          Radiology Studies: CARDIAC CATHETERIZATION Result Date: 05/19/2024 Images from the original result were not included. Cardiac Catheterization 05/19/24: Hemodynamic data: LV 171/1, EDP 17 mmHg.  Ao 179/86, mean 108 mmHg.  There is no pressure gradient across the aortic valve. Angiographic data: LM: Calcified but widely patent. LAD: Has a long proximal LAD stent that is occluded.  Distal LAD is supplied by LIMA. LCx: Very large vessel, has a long calcific CTO in the proximal and mid segment with bridging collaterals and also faint collaterals from marginals to the distal Cx.  There are 2 large marginals which are grafted. RCA: Difficult to engage, tortuous and unfolding of the aorta.  The RCA in the proximal segment has tandem 80 to 90% stenosis followed by mild disease distally, has early bifurcation into large PDA and PL branches which are supplied by vein graft. LIMA to LAD: Widely patent. SVG to OM 1: Widely patent.  OM1 also has a inferior branch which is fairly large. SVG to RCA: Jump graft of SVG to acute marginal and PDA has proximal/ostial aborted 20 to 23 mm 80% stenosis, distal graft is healthy. Impression and recommendations: In view of recent acute renal failure, chronic renal insufficiency, I utilized 50 cc of contrast to perform diagnostic angiography, would recommend staged intervention to SVG to RCA using AR-1 guide catheter.  Timing will depend upon her renal function, discussed with Dr. Mona.        LOS: 4 days   Time spent= 39 mins    Deliliah Room, MD [facility]  If 7PM-7AM, please contact night-coverage  05/20/2024, 11:49 AM

## 2024-05-20 NOTE — Progress Notes (Signed)
   Progress Note  Patient Name: [name] Date of Encounter: 05/20/2024 Primary Cardiologist: Alvan Carrier, MD   Subjective   Underwent LHC yesterday - this demonstrated disease in the SVG to RCA/PDA skip graft proximally, however, this was difficult to engage and the decision was made to performed staged intervention due to CKD.  No chest pain overnight.  Creatinine 1.77 today.  Vital Signs    Vitals:   05/19/24 2033 05/19/24 2324 05/20/24 0340 05/20/24 0628  BP: (!) 175/71 (!) 162/76 (!) 170/89 (!) 172/76  Pulse: 71 60 66 70  Resp: 20 16 19 20   Temp: 98.4 F (36.9 C) 98.4 F (36.9 C) 98.7 F (37.1 C) 98.3 F (36.8 C)  TempSrc: Oral Oral Oral Oral  SpO2: 99% 99% 100% 100%  Weight:      Height:        Intake/Output Summary (Last 24 hours) at 05/20/2024 0859 Last data filed at 05/19/2024 2233 Gross per 24 hour  Intake 1164.71 ml  Output --  Net 1164.71 ml   Filed Weights   05/16/24 0830 05/16/24 1603  Weight: 97.5 kg 89.2 kg    Physical Exam   GEN: No acute distress.   Neck: No JVD Cardiac: RRR, no murmurs, rubs, or gallops.  Respiratory: Clear to auscultation bilaterally. GI: Soft, nontender, non-distended  MS: No edema  Labs   Telemetry: Sinus bradycardia   Chemistry Recent Labs  Lab 05/18/24 0213 05/19/24 0336 05/20/24 0355  NA 139 140 139  K 4.1 4.2 4.1  CL 106 108 109  CO2 22 20* 19*  GLUCOSE 129* 136* 125*  BUN 24* 27* 25*  CREATININE 1.96* 1.75* 1.77*  CALCIUM  8.7* 8.7* 8.7*  GFRNONAA 26* 29* 29*  ANIONGAP 11 12 11      Hematology Recent Labs  Lab 05/17/24 0026 05/18/24 0213 05/19/24 0336  WBC 6.5 5.8 5.9  RBC 3.32* 3.26* 3.36*  HGB 9.5* 9.5* 9.8*  HCT 29.6* 29.3* 30.9*  MCV 89.2 89.9 92.0  MCH 28.6 29.1 29.2  MCHC 32.1 32.4 31.7  RDW 16.0* 16.1* 16.1*  PLT 224 209 220      Assessment & Plan   NSTEMI - known CAD LIMA-LAD, SVG to OM, SVG to acute marginal - known PAD - hx of HLD and CKD 3b, GFR 32 today follows  with Dr. Rachele - LDL at goal on current therapy  - in chart review no chest radiation for Breast cancer but for Non Hodkins, unclear with present documentation  - creatinine stable with cath yesterday, will hydrate overnight with plans for PCI of the SVG tomorrow.  Access recommendations: L Radial Procedural considerations: Grafts case review of last PAD assessment suggests feasibility of R fem approach - we discussed that if she has new WMA (echo ordered by Dr. Evonnie) and AKI we would discuss and use her symptom burden to decide ischemic approach, if stable GFR planned for cath Monday  Normocytic anemia - pending IM work up, iron  studies, currently reasonable for heparin  - H/H stable on heparin    For questions or updates, please contact [facility] Please consult [email or web address] for contact info under Cardiology/STEMI.   Chelsea KYM Maxcy, MD, [facility], FNLA, FACP  [place]  [facility]  Medical Director of the Advanced Lipid Disorders &  Cardiovascular Risk Reduction Clinic Diplomate of the American Board of Clinical Lipidology Attending Cardiologist  Direct Dial: [phone]  Fax: [phone]  Website:  [email or web address]  8:59 AM

## 2024-05-20 NOTE — Progress Notes (Signed)
 Mobility Specialist Progress Note;   05/20/24 0945  Mobility  Activity Ambulated with assistance  Level of Assistance Standby assist, set-up cues, supervision of patient - no hands on  Assistive Device Front wheel walker  Distance Ambulated (ft) 150 ft  Activity Response Tolerated well  Mobility Referral Yes  Mobility visit 1 Mobility  Mobility Specialist Start Time (ACUTE ONLY) 0945  Mobility Specialist Stop Time (ACUTE ONLY) 0953  Mobility Specialist Time Calculation (min) (ACUTE ONLY) 8 min   Pt agreeable to mobility. Required no physical assistance during ambulation, SV for safety. HR stable throughout ambulation, no c/o when asked. Pt returned back to chair and left with all needs met, call bell in reach.   Lauraine Erm Mobility Specialist Please contact via SecureChat or [employer] [phone]

## 2024-05-21 ENCOUNTER — Encounter (HOSPITAL_COMMUNITY)
Admission: EM | Disposition: A | Discharge status: Home / Self Care | Payer: Self-pay | Source: Home / Self Care | Attending: Hospitalist

## 2024-05-21 DIAGNOSIS — I200 Unstable angina: Secondary | ICD-10-CM | POA: Diagnosis not present

## 2024-05-21 DIAGNOSIS — I25810 Atherosclerosis of coronary artery bypass graft(s) without angina pectoris: Secondary | ICD-10-CM | POA: Diagnosis not present

## 2024-05-21 DIAGNOSIS — E785 Hyperlipidemia, unspecified: Secondary | ICD-10-CM

## 2024-05-21 HISTORY — PX: CORONARY STENT INTERVENTION: CATH118234

## 2024-05-21 LAB — CBC
HCT: 30.8 % — ABNORMAL LOW (ref 36.0–46.0)
Hemoglobin: 9.8 g/dL — ABNORMAL LOW (ref 12.0–15.0)
MCH: 29.1 pg (ref 26.0–34.0)
MCHC: 31.8 g/dL (ref 30.0–36.0)
MCV: 91.4 fL (ref 80.0–100.0)
Platelets: 225 K/uL (ref 150–400)
RBC: 3.37 MIL/uL — ABNORMAL LOW (ref 3.87–5.11)
RDW: 16.1 % — ABNORMAL HIGH (ref 11.5–15.5)
WBC: 6.5 K/uL (ref 4.0–10.5)
nRBC: 0 % (ref 0.0–0.2)

## 2024-05-21 LAB — BASIC METABOLIC PANEL WITH GFR
Anion gap: 10 (ref 5–15)
BUN: 31 mg/dL — ABNORMAL HIGH (ref 8–23)
CO2: 18 mmol/L — ABNORMAL LOW (ref 22–32)
Calcium: 8.8 mg/dL — ABNORMAL LOW (ref 8.9–10.3)
Chloride: 108 mmol/L (ref 98–111)
Creatinine, Ser: 1.89 mg/dL — ABNORMAL HIGH (ref 0.44–1.00)
GFR, Estimated: 27 mL/min — ABNORMAL LOW (ref >60)
Glucose, Bld: 143 mg/dL — ABNORMAL HIGH (ref 70–99)
Potassium: 4.2 mmol/L (ref 3.5–5.1)
Sodium: 136 mmol/L (ref 135–145)

## 2024-05-21 LAB — GLUCOSE, CAPILLARY
Glucose-Capillary: 132 mg/dL — ABNORMAL HIGH (ref 70–99)
Glucose-Capillary: 123 mg/dL — ABNORMAL HIGH (ref 70–99)
Glucose-Capillary: 130 mg/dL — ABNORMAL HIGH (ref 70–99)
Glucose-Capillary: 127 mg/dL — ABNORMAL HIGH (ref 70–99)

## 2024-05-21 LAB — POCT ACTIVATED CLOTTING TIME
Activated Clotting Time: 268 s
Activated Clotting Time: 262 s
Activated Clotting Time: 204 s
Activated Clotting Time: 187 s
Activated Clotting Time: 164 s

## 2024-05-21 SURGERY — CORONARY STENT INTERVENTION
Anesthesia: LOCAL

## 2024-05-21 MED ORDER — HEPARIN SODIUM (PORCINE) 1000 UNIT/ML IJ SOLN
INTRAMUSCULAR | Status: AC
  Filled 2024-05-21: qty 10

## 2024-05-21 MED ORDER — HYDROMORPHONE HCL 1 MG/ML IJ SOLN
INTRAMUSCULAR | Status: AC
  Filled 2024-05-21: qty 0.5

## 2024-05-21 MED ORDER — LABETALOL HCL 5 MG/ML IV SOLN
INTRAVENOUS | Status: AC
  Filled 2024-05-21: qty 4

## 2024-05-21 MED ORDER — ENOXAPARIN SODIUM 30 MG/0.3ML IJ SOSY
30.0000 mg | PREFILLED_SYRINGE | INTRAMUSCULAR | Status: DC
  Administered 2024-05-22: 30 mg via SUBCUTANEOUS
  Filled 2024-05-21: qty 0.3

## 2024-05-21 MED ORDER — FENTANYL CITRATE (PF) 100 MCG/2ML IJ SOLN
INTRAMUSCULAR | Status: DC | PRN
  Administered 2024-05-21: 25 ug via INTRAVENOUS

## 2024-05-21 MED ORDER — LABETALOL HCL 5 MG/ML IV SOLN
10.0000 mg | INTRAVENOUS | Status: AC | PRN
  Administered 2024-05-21: 10 mg via INTRAVENOUS

## 2024-05-21 MED ORDER — LIDOCAINE HCL (PF) 1 % IJ SOLN
INTRAMUSCULAR | Status: AC
  Filled 2024-05-21: qty 30

## 2024-05-21 MED ORDER — MIDAZOLAM HCL 2 MG/2ML IJ SOLN
INTRAMUSCULAR | Status: AC
  Filled 2024-05-21: qty 2

## 2024-05-21 MED ORDER — NITROGLYCERIN 1 MG/10 ML FOR IR/CATH LAB
INTRA_ARTERIAL | Status: AC
  Filled 2024-05-21: qty 10

## 2024-05-21 MED ORDER — SODIUM CHLORIDE 0.9 % WEIGHT BASED INFUSION
1.0000 mL/kg/h | INTRAVENOUS | Status: DC
  Administered 2024-05-21: 1 mL/kg/h via INTRAVENOUS

## 2024-05-21 MED ORDER — HYDRALAZINE HCL 20 MG/ML IJ SOLN
INTRAMUSCULAR | Status: AC
  Filled 2024-05-21: qty 1

## 2024-05-21 MED ORDER — LIDOCAINE HCL (PF) 1 % IJ SOLN
INTRAMUSCULAR | Status: DC | PRN
  Administered 2024-05-21: 15 mL

## 2024-05-21 MED ORDER — NITROGLYCERIN 1 MG/10 ML FOR IR/CATH LAB
INTRA_ARTERIAL | Status: DC | PRN
  Administered 2024-05-21 (×3): 200 ug via INTRACORONARY

## 2024-05-21 MED ORDER — HYDRALAZINE HCL 20 MG/ML IJ SOLN: 10.0000 mg | INTRAMUSCULAR | Status: AC | PRN

## 2024-05-21 MED ORDER — SODIUM CHLORIDE 0.9% FLUSH
3.0000 mL | Freq: Two times a day (BID) | INTRAVENOUS | Status: DC
  Administered 2024-05-21: 3 mL via INTRAVENOUS

## 2024-05-21 MED ORDER — IOHEXOL 350 MG/ML SOLN
INTRAVENOUS | Status: DC | PRN
  Administered 2024-05-21: 25 mL

## 2024-05-21 MED ORDER — MIDAZOLAM HCL 2 MG/2ML IJ SOLN
INTRAMUSCULAR | Status: DC | PRN
  Administered 2024-05-21: 1 mg via INTRAVENOUS

## 2024-05-21 MED ORDER — HEPARIN (PORCINE) IN NACL 1000-0.9 UT/500ML-% IV SOLN
INTRAVENOUS | Status: DC | PRN
  Administered 2024-05-21 (×2): 500 mL

## 2024-05-21 MED ORDER — SODIUM CHLORIDE 0.9% FLUSH: 3.0000 mL | INTRAVENOUS | Status: DC | PRN

## 2024-05-21 MED ORDER — HEPARIN SODIUM (PORCINE) 1000 UNIT/ML IJ SOLN
INTRAMUSCULAR | Status: DC | PRN
  Administered 2024-05-21: 9000 [IU] via INTRAVENOUS

## 2024-05-21 MED ORDER — SODIUM CHLORIDE 0.9 % WEIGHT BASED INFUSION: 1.0000 mL/kg/h | INTRAVENOUS | Status: AC

## 2024-05-21 MED ORDER — SODIUM CHLORIDE 0.9 % IV SOLN: 250.0000 mL | INTRAVENOUS | Status: DC | PRN

## 2024-05-21 MED ORDER — HYDRALAZINE HCL 20 MG/ML IJ SOLN
INTRAMUSCULAR | Status: DC | PRN
  Administered 2024-05-21: 10 mg via INTRAVENOUS
  Administered 2024-05-21: 20 mg

## 2024-05-21 MED ORDER — FENTANYL CITRATE (PF) 100 MCG/2ML IJ SOLN
INTRAMUSCULAR | Status: AC
  Filled 2024-05-21: qty 2

## 2024-05-21 SURGICAL SUPPLY — 15 items
BALLOON EMERGE MR 2.0X15 (BALLOONS) IMPLANT
BALLOON ~~LOC~~ EMERGE MR 2.5X15 (BALLOONS) IMPLANT
CATHETER LAUNCHER 6FR MP1 (CATHETERS) IMPLANT
ELECT DEFIB PAD ADLT CADENCE (PAD) IMPLANT
KIT ENCORE 26 ADVANTAGE (KITS) IMPLANT
KIT MICROPUNCTURE NIT STIFF (SHEATH) IMPLANT
MAT PREVALON FULL STRYKER (MISCELLANEOUS) IMPLANT
PACK CARDIAC CATHETERIZATION (CUSTOM PROCEDURE TRAY) ×2 IMPLANT
SET ATX-X65L (MISCELLANEOUS) IMPLANT
SHEATH PINNACLE 6F 10CM (SHEATH) IMPLANT
SHEATH PROBE COVER 6X72 (BAG) IMPLANT
STENT ONYX FRONTIER 2.25X22 (Permanent Stent) IMPLANT
TUBING CIL FLEX 10 FLL-RA (TUBING) IMPLANT
WIRE ASAHI PROWATER 180CM (WIRE) IMPLANT
WIRE EMERALD 3MM-J .035X150CM (WIRE) IMPLANT

## 2024-05-21 NOTE — Progress Notes (Addendum)
 Progress Note  Patient Name: [name] Date of Encounter: 05/21/2024 Primary Cardiologist: Alvan Carrier, MD   Subjective   Hydrated with 1L NS yesterday over 12 hours - creatinine however, a little higher today at 1.89 (GFR 27, was 1.77/GFR 29). Plan is for PCI of the SVG today. LP(a) elevated at 245 nmol/L.  Vital Signs    Vitals:   05/20/24 2007 05/20/24 2324 05/21/24 0403 05/21/24 0735  BP: (!) 169/58 (!) 138/58 (!) 153/69 (!) 145/56  Pulse:  67 70 66  Resp: 14 16 (!) 22 17  Temp:  98.1 F (36.7 C) 98.4 F (36.9 C) 98.5 F (36.9 C)  TempSrc:  Oral Oral Oral  SpO2:  96% 97% 99%  Weight:      Height:        Intake/Output Summary (Last 24 hours) at 05/21/2024 0812 Last data filed at 05/21/2024 0500 Gross per 24 hour  Intake 386.61 ml  Output --  Net 386.61 ml   Filed Weights   05/16/24 0830 05/16/24 1603  Weight: 97.5 kg 89.2 kg    Physical Exam   GEN: No acute distress.   Neck: No JVD Cardiac: RRR, no murmurs, rubs, or gallops.  Respiratory: Clear to auscultation bilaterally. GI: Soft, nontender, non-distended  MS: No edema  Labs   Telemetry: Sinus bradycardia   Chemistry Recent Labs  Lab 05/19/24 0336 05/20/24 0355 05/21/24 0249  NA 140 139 136  K 4.2 4.1 4.2  CL 108 109 108  CO2 20* 19* 18*  GLUCOSE 136* 125* 143*  BUN 27* 25* 31*  CREATININE 1.75* 1.77* 1.89*  CALCIUM  8.7* 8.7* 8.8*  GFRNONAA 29* 29* 27*  ANIONGAP 12 11 10      Hematology Recent Labs  Lab 05/18/24 0213 05/19/24 0336 05/21/24 0249  WBC 5.8 5.9 6.5  RBC 3.26* 3.36* 3.37*  HGB 9.5* 9.8* 9.8*  HCT 29.3* 30.9* 30.8*  MCV 89.9 92.0 91.4  MCH 29.1 29.2 29.1  MCHC 32.4 31.7 31.8  RDW 16.1* 16.1* 16.1*  PLT 209 220 225      Assessment & Plan   NSTEMI - known CAD LIMA-LAD, SVG to OM, SVG to acute marginal - known PAD - hx of HLD and CKD 3b, GFR 32 today follows with Dr. Rachele - LDL at goal on current therapy  - in chart review no chest radiation for  Breast cancer but for Non Hodkins, unclear with present documentation  - creatinine mildly increased overnight, but well hydrated yesterday- I believe it will be ok to proceed with PCI today - should not require large amounts of contrast. Discussed potential risks, including worsening renal function and low possibility of proceeding to dialysis and she is agreeable to proceed.  Dyslipidemia, goal LDL <55 - LDL 32 on atorvastatin  40 mg daily, continue current medication - Lp(a) elevated at 245 nmol/L - may be a candidate for specific Lp(a) inhibitors in the near future, would not qualify for PCSK9i given LDL is well-treated    Informed Consent   Shared Decision Making/Informed Consent The risks [stroke (1 in 1000), death (1 in 1000), kidney failure [usually temporary] (1 in 500), bleeding (1 in 200), allergic reaction [possibly serious] (1 in 200)], benefits (diagnostic support and management of coronary artery disease) and alternatives of a cardiac catheterization were discussed in detail with Ms. Boda and she is willing to proceed.     Normocytic anemia - pending IM work up, iron  studies, currently reasonable for heparin  - H/H stable on heparin  (  05/04/29.8)   For questions or updates, please contact [facility] Please consult [email or web address] for contact info under Cardiology/STEMI.   Vinie KYM Maxcy, MD, [facility], FNLA, FACP  [place]  [facility]  Medical Director of the Advanced Lipid Disorders &  Cardiovascular Risk Reduction Clinic Diplomate of the American Board of Clinical Lipidology Attending Cardiologist  Direct Dial: [phone]  Fax: [phone]  Website:  [email or web address]  8:12 AM

## 2024-05-21 NOTE — Progress Notes (Signed)
 Site area: Right Femoral  Site Prior to Removal:  Level Level 0 Pressure Applied For 30 min Manual:   Yes  Patient Status During Pull:  stable  Post Pull Site:  Level 0 Post Pull Instructions Given:  Yes   Post Pull Pulses Present: Bilateral Fem and Rt Dp. palpable Dressing Applied:  Gauze and tegaderm Bedrest begins @ 1402 Comments:

## 2024-05-21 NOTE — Progress Notes (Signed)
 PROGRESS NOTE    [name]  FMW:[id number] DOB: Dec 04, 1945 DOA: 05/16/2024 PCP: Lari Elspeth BRAVO, MD   Brief Narrative:    78 y/o female with history of DM2, hypertension, hyperlipidemia, coronary disease status post CABG, peripheral vascular disease, CKD stage III, and left breast cancer presenting with chest pain that started on 05/13/2024.  Presented to the ED for an ER with preliminary evaluation revealed elevated troponin 62, 88.  EKG sinus rhythm, no ST or T wave changes.  Patient was given full dose aspirin , started on IV heparin  and transferred to [facility] for further management.  Since arrival to [facility], she was initiated on IV nitroglycerin  to help with pain. Off of nitroglycerin  drip now.  S/p Cardiac cath on 9/23 Plan for PCI of SVG to be done today.    Assessment & Plan:  Principal Problem:   Unstable angina (HCC) Active Problems:   CORONARY ARTERY DISEASE, S/P PTCA   Essential hypertension   Malignant neoplasm of lower-inner quadrant of left breast in female, estrogen receptor positive (HCC)   Chronic kidney disease, stage 3b (HCC)    Non-ST elevation MI,POA -in the setting of prior CABG (2013)   -continue  aspirin , statin, metoprolol .  Titrated off nitroglycerin .   -Left heart catheterization 9/23: Notes reviewed.  SVG to RCA 80% stenosis Plan for staged PCI to SVG today. -Echocardiogram shows LVEF 55 to 60%, wall motion abnormalities noted. -prn IVF -continue with aspirin , statin and beta blockers. -She does have highly elevated lipoprotein a level.   CKD stage IIIb - Baseline creatinine 1.6-1.7.  Received IV Lasix  9/21, creatinine jumped to 1.96.  Improved to 1.74 with IV fluid and slightly increased today to 1.8.  Will monitor renal status closely   Iron  deficiency anemia: Hemoglobin is stable at 9.5.  Down from 11.8 on admission.   Iron  panel: Total iron  62, UIBC 284, TIBC 346, saturation ratio 18, ferritin 52 Received IV heparin .  Monitor for any  bleeding   Essential hypertension - Continue metoprolol  25 mg twice daily.   - Holding lisinopril  secondary to CKD - Holding chlorthalidone  - Continue hydralazine  Not on amlodipine    Hypokalemia/hypomagnesemia: Replace as needed   Mixed hyperlipidemia - Continue Lipitor - Check lipid panel   Diabetes mellitus type 2 - Holding metformin  - hemoglobin A1c is 6.4% - NovoLog  sliding scale   Left breast cancer - Patient has finished 5 years of Arimidex  - Status postlumpectomy    Peripheral arterial disease - 03/17/2024 ABI shows bilateral severe disease - Follow-up Dr. Magda outpatient   Coronary artery disease -CABG Jan 2013: 3 vessel (LIMA to LAD, SVG to OM, SVG to acute marginal)  - She has had prior stenting - She follows Dr. Alvan Muff II obesity: - She is a candidate for AOM based on her BMI and obesity related comorbidities.  DVT prophylaxis: heparin  injection 5,000 Units Start: 05/19/24 2200     Code Status: Full Code Family Communication:  Family at the bedside Status is: Inpatient Remains inpatient appropriate because: NSTEMI, Needs cardiac cath    Subjective:  No acute events overnight. Going for cardiac cath today, Denies active chest pain or shortness of breath.  Examination:  General exam: Appears calm and comfortable  Respiratory system: Clear to auscultation. Respiratory effort normal. Cardiovascular system: S1 & S2 heard, RRR. No JVD, murmurs, rubs, gallops or clicks. No pedal edema. Gastrointestinal system: Abdomen is nondistended, soft and nontender. No organomegaly or masses felt. Normal bowel sounds heard. Central nervous  system: Alert and oriented. No focal neurological deficits. Extremities: Symmetric 5 x 5 power. Skin: No rashes, lesions or ulcers Psychiatry: Judgement and insight appear normal. Mood & affect appropriate.       Diet Orders (From admission, onward)     Start     Ordered   05/21/24 0500  Diet NPO time specified  Except for: Sips with Meds  (Inpatient Diet Options)  Diet effective 0500       Question:  Except for  Answer:  Sips with Meds   05/20/24 1250            Objective: Vitals:   05/21/24 0403 05/21/24 0735 05/21/24 0951 05/21/24 1100  BP: (!) 153/69 (!) 145/56  (!) 164/75  Pulse: 70 66 75 75  Resp: (!) 22 17  20   Temp: 98.4 F (36.9 C) 98.5 F (36.9 C)    TempSrc: Oral Oral    SpO2: 97% 99%  98%  Weight:      Height:        Intake/Output Summary (Last 24 hours) at 05/21/2024 1115 Last data filed at 05/21/2024 0500 Gross per 24 hour  Intake 386.61 ml  Output --  Net 386.61 ml   Filed Weights   05/16/24 0830 05/16/24 1603  Weight: 97.5 kg 89.2 kg    Scheduled Meds:  [MAR Hold] allopurinol   100 mg Oral QHS   [MAR Hold] vitamin C   1,000 mg Oral Daily   [MAR Hold] aspirin   81 mg Oral Daily   [MAR Hold] atorvastatin   40 mg Oral Daily   [MAR Hold] cholecalciferol   2,000 Units Oral Daily   [MAR Hold] cyanocobalamin   500 mcg Oral Daily   [MAR Hold] heparin   5,000 Units Subcutaneous Q8H   [MAR Hold] hydrALAZINE   25 mg Oral BID   [MAR Hold] insulin  aspart  0-9 Units Subcutaneous TID WC   [MAR Hold] latanoprost   1 drop Right Eye QHS   [MAR Hold] metoprolol  tartrate  25 mg Oral BID   nitroGLYCERIN        [MAR Hold] sodium chloride  flush  3 mL Intravenous Q12H   [MAR Hold] ticagrelor   90 mg Oral BID   Continuous Infusions:  sodium chloride  1 mL/kg/hr (05/21/24 0907)   [MAR Hold] nitroGLYCERIN  Stopped (05/17/24 0606)    Nutritional status     Body mass index is 35.97 kg/m.  Data Reviewed:   CBC: Recent Labs  Lab 05/16/24 0947 05/17/24 0026 05/18/24 0213 05/19/24 0336 05/21/24 0249  WBC 6.0 6.5 5.8 5.9 6.5  HGB 9.9* 9.5* 9.5* 9.8* 9.8*  HCT 32.0* 29.6* 29.3* 30.9* 30.8*  MCV 94.7 89.2 89.9 92.0 91.4  PLT 232 224 209 220 225   Basic Metabolic Panel: Recent Labs  Lab 05/17/24 0026 05/18/24 0213 05/19/24 0336 05/20/24 0355 05/21/24 0249  NA 140 139 140  139 136  K 3.2* 4.1 4.2 4.1 4.2  CL 108 106 108 109 108  CO2 19* 22 20* 19* 18*  GLUCOSE 125* 129* 136* 125* 143*  BUN 20 24* 27* 25* 31*  CREATININE 1.64* 1.96* 1.75* 1.77* 1.89*  CALCIUM  8.8* 8.7* 8.7* 8.7* 8.8*  MG 1.5*  --   --   --   --    GFR: Estimated Creatinine Clearance: 25 mL/min (A) (by C-G formula based on SCr of 1.89 mg/dL (H)). Liver Function Tests: No results for input(s): AST, ALT, ALKPHOS, BILITOT, PROT, ALBUMIN  in the last 168 hours. No results for input(s): LIPASE, AMYLASE in the last 168 hours. No  results for input(s): AMMONIA in the last 168 hours. Coagulation Profile: No results for input(s): INR, PROTIME in the last 168 hours. Cardiac Enzymes: No results for input(s): CKTOTAL, CKMB, CKMBINDEX, TROPONINI in the last 168 hours. BNP (last 3 results) No results for input(s): PROBNP in the last 8760 hours. HbA1C: No results for input(s): HGBA1C in the last 72 hours. CBG: Recent Labs  Lab 05/20/24 0625 05/20/24 1159 05/20/24 1633 05/20/24 2058 05/21/24 0619  GLUCAP 116* 170* 114* 133* 132*   Lipid Profile: No results for input(s): CHOL, HDL, LDLCALC, TRIG, CHOLHDL, LDLDIRECT in the last 72 hours. Thyroid  Function Tests: No results for input(s): TSH, T4TOTAL, FREET4, T3FREE, THYROIDAB in the last 72 hours. Anemia Panel: No results for input(s): VITAMINB12, FOLATE, FERRITIN, TIBC, IRON , RETICCTPCT in the last 72 hours. Sepsis Labs: No results for input(s): PROCALCITON, LATICACIDVEN in the last 168 hours.  Recent Results (from the past 240 hours)  MRSA Next Gen by PCR, Nasal     Status: None   Collection Time: 05/16/24  4:05 PM   Specimen: Nasal Mucosa; Nasal Swab  Result Value Ref Range Status   MRSA by PCR Next Gen NOT DETECTED NOT DETECTED Final    Comment: (NOTE) The GeneXpert MRSA Assay (FDA approved for NASAL specimens only), is one component of a comprehensive MRSA  colonization surveillance program. It is not intended to diagnose MRSA infection nor to guide or monitor treatment for MRSA infections. Test performance is not FDA approved in patients less than 78 years old. Performed at [facility], [address], [place], [place] [address]          Radiology Studies: CARDIAC CATHETERIZATION Result Date: 05/21/2024   Origin to Prox Graft lesion before Acute Mrg  is 80% stenosed.   A drug-eluting stent was successfully placed using a STENT ONYX FRONTIER 2.25X22.   Post intervention, there is a 0% residual stenosis.   Recommend uninterrupted dual antiplatelet therapy with Aspirin  81mg  daily and Ticagrelor  90mg  twice daily for a minimum of 12 months (ACS-Class I recommendation). Successful PCI of the ostial/proximal SVG to RCA with DES x 1 Plan: DAPT for one year.        LOS: 5 days   Time spent= 39 mins    Deliliah Room, MD [facility]  If 7PM-7AM, please contact night-coverage  05/21/2024, 11:15 AM

## 2024-05-22 ENCOUNTER — Encounter (HOSPITAL_COMMUNITY): Payer: Self-pay | Admitting: Cardiology

## 2024-05-22 ENCOUNTER — Other Ambulatory Visit: Payer: Self-pay | Admitting: Cardiology

## 2024-05-22 ENCOUNTER — Other Ambulatory Visit (HOSPITAL_COMMUNITY): Payer: Self-pay

## 2024-05-22 ENCOUNTER — Inpatient Hospital Stay (INDEPENDENT_AMBULATORY_CARE_PROVIDER_SITE_OTHER)

## 2024-05-22 DIAGNOSIS — I480 Paroxysmal atrial fibrillation: Secondary | ICD-10-CM

## 2024-05-22 DIAGNOSIS — I200 Unstable angina: Secondary | ICD-10-CM | POA: Diagnosis not present

## 2024-05-22 DIAGNOSIS — N1832 Chronic kidney disease, stage 3b: Secondary | ICD-10-CM | POA: Diagnosis not present

## 2024-05-22 DIAGNOSIS — Z955 Presence of coronary angioplasty implant and graft: Secondary | ICD-10-CM

## 2024-05-22 DIAGNOSIS — I10 Essential (primary) hypertension: Secondary | ICD-10-CM | POA: Diagnosis not present

## 2024-05-22 LAB — BASIC METABOLIC PANEL WITH GFR
Anion gap: 13 (ref 5–15)
BUN: 26 mg/dL — ABNORMAL HIGH (ref 8–23)
CO2: 18 mmol/L — ABNORMAL LOW (ref 22–32)
Calcium: 8.8 mg/dL — ABNORMAL LOW (ref 8.9–10.3)
Chloride: 107 mmol/L (ref 98–111)
Creatinine, Ser: 1.65 mg/dL — ABNORMAL HIGH (ref 0.44–1.00)
GFR, Estimated: 31 mL/min — ABNORMAL LOW (ref >60)
Glucose, Bld: 129 mg/dL — ABNORMAL HIGH (ref 70–99)
Potassium: 4 mmol/L (ref 3.5–5.1)
Sodium: 138 mmol/L (ref 135–145)

## 2024-05-22 LAB — CBC
HCT: 27.6 % — ABNORMAL LOW (ref 36.0–46.0)
Hemoglobin: 8.8 g/dL — ABNORMAL LOW (ref 12.0–15.0)
MCH: 28.9 pg (ref 26.0–34.0)
MCHC: 31.9 g/dL (ref 30.0–36.0)
MCV: 90.8 fL (ref 80.0–100.0)
Platelets: 219 K/uL (ref 150–400)
RBC: 3.04 MIL/uL — ABNORMAL LOW (ref 3.87–5.11)
RDW: 16.5 % — ABNORMAL HIGH (ref 11.5–15.5)
WBC: 6.9 K/uL (ref 4.0–10.5)
nRBC: 0 % (ref 0.0–0.2)

## 2024-05-22 LAB — GLUCOSE, CAPILLARY
Glucose-Capillary: 96 mg/dL (ref 70–99)
Glucose-Capillary: 178 mg/dL — ABNORMAL HIGH (ref 70–99)

## 2024-05-22 MED ORDER — TICAGRELOR 90 MG PO TABS
90.0000 mg | ORAL_TABLET | Freq: Two times a day (BID) | ORAL | 0 refills | Status: DC
  Filled 2024-05-22: qty 60, 30d supply, fill #0

## 2024-05-22 MED ORDER — VITAMIN C 1000 MG PO TABS
1000.0000 mg | ORAL_TABLET | Freq: Every day | ORAL | 0 refills | Status: AC
  Filled 2024-05-22: qty 30, 30d supply, fill #0

## 2024-05-22 MED ORDER — CYANOCOBALAMIN 1000 MCG PO TABS
500.0000 ug | ORAL_TABLET | Freq: Every day | ORAL | 0 refills | Status: DC
  Filled 2024-05-22: qty 30, 60d supply, fill #0

## 2024-05-22 MED ORDER — METOPROLOL TARTRATE 50 MG PO TABS
50.0000 mg | ORAL_TABLET | Freq: Two times a day (BID) | ORAL | Status: DC
  Administered 2024-05-22: 50 mg via ORAL
  Filled 2024-05-22: qty 1

## 2024-05-22 MED ORDER — POLYETHYLENE GLYCOL 3350 17 G PO PACK: 17.0000 g | PACK | Freq: Every day | ORAL | Status: DC

## 2024-05-22 MED ORDER — VITAMIN D 50 MCG (2000 UT) PO TABS
2000.0000 [IU] | ORAL_TABLET | Freq: Every day | ORAL | 0 refills | Status: AC
  Filled 2024-05-22: qty 30, 30d supply, fill #0

## 2024-05-22 NOTE — Discharge Summary (Signed)
 Physician Discharge Summary   Patient: [name] MRN: [id number] DOB: 09-27-1945  Admit date:     05/16/2024  Discharge date: 05/22/24  Discharge Physician: Deliliah Room   PCP: Lari Elspeth BRAVO, MD   Recommendations at discharge:    F/u with your PCP in one week. F/u outpatient with cardiologist, Dr Alvan in 2-3 weeks. Continue taking meds as prescribed.  Discharge Diagnoses: Principal Problem:   Unstable angina (HCC) Active Problems:   CORONARY ARTERY DISEASE, S/P PTCA   Essential hypertension   Malignant neoplasm of lower-inner quadrant of left breast in female, estrogen receptor positive (HCC)   Chronic kidney disease, stage 3b [facility])    Hospital Course:  78 y/o female with history of DM2, hypertension, hyperlipidemia, coronary disease status post CABG, peripheral vascular disease, CKD stage III, and left breast cancer presenting with chest pain that started on 05/13/2024.  Presented to the ED for an ER with preliminary evaluation revealed elevated troponin 62, 88.  EKG sinus rhythm, no ST or T wave changes.  Patient was given full dose aspirin , started on IV heparin  and transferred to [facility] for further management.  Since arrival to [facility], [place], she was initiated on IV nitroglycerin  to help with pain. Off of nitroglycerin  drip now.  S/p Cardiac cath on 9/23 S/p cardiac cath done on 9/25 with Successful PCI of the ostial/proximal SVG to RCA with DES x 1  Plan is to continue DAPT for one year. Patient did have couple of episodes of afib in the hospital so a Zio monitor was recommended by cardiologist for 2 weeks, Metoprolol  dose was increased to 50 mg BID on discharge. She will follow up outpatient with Dr Dorn, Alvan.  Case discussed with patient's daughter at the bedside. Patient will be staying with her for about a week in [place], [place].      Consultants: Cardiology Procedures performed: PCI  Disposition: Home Diet recommendation:  Cardiac diet DISCHARGE  MEDICATION: Allergies as of 05/22/2024   Not on File      Medication List     STOP taking these medications    amLODipine  10 MG tablet Commonly known as: NORVASC    lisinopril  40 MG tablet Commonly known as: ZESTRIL        TAKE these medications    acetaminophen  500 MG tablet Commonly known as: TYLENOL  Take 500 mg by mouth 2 (two) times daily as needed for moderate pain or headache.   allopurinol  100 MG tablet Commonly known as: ZYLOPRIM  Take 100 mg by mouth at bedtime.   aspirin  EC 81 MG tablet Take 81 mg by mouth at bedtime.   atorvastatin  40 MG tablet Commonly known as: LIPITOR Take 40 mg by mouth at bedtime.   chlorthalidone  25 MG tablet Commonly known as: HYGROTON  Take 12.5 mg by mouth every morning. What changed:  how much to take when to take this   fluticasone 50 MCG/ACT nasal spray Commonly known as: FLONASE Place 1 spray into both nostrils as needed for allergies or rhinitis.   hydrALAZINE  25 MG tablet Commonly known as: APRESOLINE  Take 25 mg by mouth 2 (two) times daily.   Lumigan 0.01 % Soln Generic drug: bimatoprost Place 1 drop into both eyes at bedtime.   metFORMIN  500 MG tablet Commonly known as: GLUCOPHAGE  Take 500 mg by mouth 2 (two) times daily with a meal.   metoprolol  tartrate 50 MG tablet Commonly known as: LOPRESSOR  Take 1 tablet (50 mg total) by mouth 2 (two) times daily.   nitroGLYCERIN  0.4 MG  SL tablet Commonly known as: NITROSTAT  Place 1 tablet (0.4 mg total) under the tongue every 5 (five) minutes x 3 doses as needed for chest pain (If no relief after 3rd dose, proceed to the ED or call 911).   potassium chloride  10 MEQ tablet Commonly known as: KLOR-CON  Take 10 mEq by mouth every Monday, Wednesday, and Friday.   ticagrelor  90 MG Tabs tablet Commonly known as: BRILINTA  Take 1 tablet (90 mg total) by mouth 2 (two) times daily.   VITAMIN B-12 PO Take 1 tablet by mouth daily. What changed: Another medication with the  same name was changed. Make sure you understand how and when to take each.   cyanocobalamin  1000 MCG tablet Take 0.5 tablets (500 mcg total) by mouth daily. What changed: medication strength   vitamin C  1000 MG tablet Take 1 tablet (1,000 mg total) by mouth daily.   VITAMIN C  PO Take 1 tablet by mouth daily.   VITAMIN D -3 PO Take 1 capsule by mouth daily.   Vitamin D  50 MCG (2000 UT) tablet Take 1 tablet (2,000 Units total) by mouth daily.        Follow-up Information     Burdine, Elspeth BRAVO, MD. Schedule an appointment as soon as possible for a visit in 1 week(s).   Specialty: Family Medicine Contact information: [address] [place] [address] [phone]         Alvan Dorn FALCON, MD. Schedule an appointment as soon as possible for a visit in 2 week(s).   Specialty: Cardiology Contact information: [address] [place] [address] [phone]                Discharge Exam: Fredricka Weights   05/16/24 0830 05/16/24 1603  Weight: 97.5 kg 89.2 kg   Constitutional: NAD, calm, comfortable Eyes: PERRL, lids and conjunctivae normal ENMT: Mucous membranes are moist. Posterior pharynx clear of any exudate or lesions.Normal dentition.  Neck: normal, supple, no masses, no thyromegaly Respiratory: clear to auscultation bilaterally, no wheezing, no crackles. Normal respiratory effort. No accessory muscle use.  Cardiovascular: Regular rate and rhythm, no murmurs / rubs / gallops. No extremity edema. 2+ pedal pulses. No carotid bruits.  Abdomen: no tenderness, no masses palpated. No hepatosplenomegaly. Bowel sounds positive.  Musculoskeletal: no clubbing / cyanosis. No joint deformity upper and lower extremities. Good ROM, no contractures. Normal muscle tone.  Skin: no rashes, lesions, ulcers. No induration Neurologic: CN 2-12 grossly intact. Sensation intact, DTR normal. Strength 5/5 x all 4 extremities.  Psychiatric: Normal judgment and insight. Alert and  oriented x 3. Normal mood.    Condition at discharge: good  The results of significant diagnostics from this hospitalization (including imaging, microbiology, ancillary and laboratory) are listed below for reference.   Imaging Studies: CARDIAC CATHETERIZATION Result Date: 05/21/2024   Origin to Prox Graft lesion before Acute Mrg  is 80% stenosed.   A drug-eluting stent was successfully placed using a STENT ONYX FRONTIER 2.25X22.   Post intervention, there is a 0% residual stenosis.   Recommend uninterrupted dual antiplatelet therapy with Aspirin  81mg  daily and Ticagrelor  90mg  twice daily for a minimum of 12 months (ACS-Class I recommendation). Successful PCI of the ostial/proximal SVG to RCA with DES x 1 Plan: DAPT for one year.   CARDIAC CATHETERIZATION Result Date: 05/19/2024 Images from the original result were not included. Cardiac Catheterization 05/19/24: Hemodynamic data: LV 171/1, EDP 17 mmHg.  Ao 179/86, mean 108 mmHg.  There is no pressure gradient across the  aortic valve. Angiographic data: LM: Calcified but widely patent. LAD: Has a long proximal LAD stent that is occluded.  Distal LAD is supplied by LIMA. LCx: Very large vessel, has a long calcific CTO in the proximal and mid segment with bridging collaterals and also faint collaterals from marginals to the distal Cx.  There are 2 large marginals which are grafted. RCA: Difficult to engage, tortuous and unfolding of the aorta.  The RCA in the proximal segment has tandem 80 to 90% stenosis followed by mild disease distally, has early bifurcation into large PDA and PL branches which are supplied by vein graft. LIMA to LAD: Widely patent. SVG to OM 1: Widely patent.  OM1 also has a inferior branch which is fairly large. SVG to RCA: Jump graft of SVG to acute marginal and PDA has proximal/ostial aborted 20 to 23 mm 80% stenosis, distal graft is healthy. Impression and recommendations: In view of recent acute renal failure, chronic renal  insufficiency, I utilized 50 cc of contrast to perform diagnostic angiography, would recommend staged intervention to SVG to RCA using AR-1 guide catheter.  Timing will depend upon her renal function, discussed with Dr. Mona.   ECHOCARDIOGRAM COMPLETE Result Date: 05/17/2024    ECHOCARDIOGRAM REPORT   Patient Name:   LORI POPOWSKI Date of Exam: 05/17/2024 Medical Rec #:  [id number]       Height:       62.0 in Accession #:    [id number]      Weight:       196.6 lb Date of Birth:  June 22, 1946        BSA:          1.898 m Patient Age:    78 years        BP:           155/64 mmHg Patient Gender: F               HR:           62 bpm. Exam Location:  Inpatient Procedure: 2D Echo (Both Spectral and Color Flow Doppler were utilized during            procedure). Indications:    NSTEMI  History:        Patient has prior history of Echocardiogram examinations, most                 recent 09/03/2011. Prior CABG, chronic kidney disease. history of                 breast cancer.; Risk Factors:Diabetes.  Sonographer:    Tinnie Barefoot RDCS Referring Phys: [phone] DAVID TAT  Sonographer Comments: Image acquisition challenging due to patient body habitus. IMPRESSIONS  1. No left ventricular thrombus is seen (Definity  contrast was used). There is a small apical anterior and apical septal area of severe hypokinesis. Left ventricular ejection fraction, by estimation, is 55 to 60%. The left ventricle has normal function.  The left ventricle demonstrates regional wall motion abnormalities (see scoring diagram/findings for description). There is mild concentric left ventricular hypertrophy. Left ventricular diastolic parameters are consistent with Grade II diastolic dysfunction (pseudonormalization). Elevated left atrial pressure.  2. Right ventricular systolic function is mildly reduced. The right ventricular size is mildly enlarged. There is severely elevated pulmonary artery systolic pressure. The estimated right ventricular systolic  pressure is 64.2 mmHg.  3. Left atrial size was moderately dilated.  4. Right atrial size was moderately dilated.  5. The mitral valve  is normal in structure. Mild mitral valve regurgitation. No evidence of mitral stenosis.  6. The tricuspid valve is abnormal. Tricuspid valve regurgitation is moderate to severe.  7. The aortic valve is tricuspid. Aortic valve regurgitation is not visualized. No aortic stenosis is present. Comparison(s): Prior images unable to be directly viewed, comparison made by report only. There is a new apical area of hypokinesis, consistent with infarction in the distal LAD artery distribution. There is new evidence of increased mean left atrial pressure. FINDINGS  Left Ventricle: No left ventricular thrombus is seen (Definity  contrast was used). There is a small apical anterior and apical septal area of severe hypokinesis. Left ventricular ejection fraction, by estimation, is 55 to 60%. The left ventricle has normal function. The left ventricle demonstrates regional wall motion abnormalities. Definity  contrast agent was given IV to delineate the left ventricular endocardial borders. The left ventricular internal cavity size was normal in size. There is mild concentric left ventricular hypertrophy. Abnormal (paradoxical) septal motion consistent with post-operative status. Left ventricular diastolic parameters are consistent with Grade II diastolic dysfunction (pseudonormalization). Elevated left atrial pressure.  LV Wall Scoring: The apical septal segment, apical anterior segment, and apex are hypokinetic. The anterior wall, entire lateral wall, anterior septum, entire inferior wall, mid inferoseptal segment, and basal inferoseptal segment are normal. Right Ventricle: The right ventricular size is mildly enlarged. Right vetricular wall thickness was not well visualized. Right ventricular systolic function is mildly reduced. There is severely elevated pulmonary artery systolic pressure. The  tricuspid regurgitant velocity is 3.91 m/s, and with an assumed right atrial pressure of 3 mmHg, the estimated right ventricular systolic pressure is 64.2 mmHg. Left Atrium: Left atrial size was moderately dilated. Right Atrium: Right atrial size was moderately dilated. Pericardium: There is no evidence of pericardial effusion. Mitral Valve: The mitral valve is normal in structure. Mild mitral valve regurgitation, with centrally-directed jet. No evidence of mitral valve stenosis. Tricuspid Valve: The tricuspid valve is abnormal. Tricuspid valve regurgitation is moderate to severe. Aortic Valve: The aortic valve is tricuspid. Aortic valve regurgitation is not visualized. No aortic stenosis is present. Pulmonic Valve: The pulmonic valve was normal in structure. Pulmonic valve regurgitation is trivial. No evidence of pulmonic stenosis. Aorta: The aortic root is normal in size and structure. Venous: IVC assessment for right atrial pressure unable to be performed due to mechanical ventilation. IAS/Shunts: No atrial level shunt detected by color flow Doppler.  LEFT VENTRICLE PLAX 2D LVIDd:         4.30 cm   Diastology LVIDs:         2.90 cm   LV e' medial:    9.25 cm/s LV PW:         1.20 cm   LV E/e' medial:  12.5 LV IVS:        1.30 cm   LV e' lateral:   7.72 cm/s LVOT diam:     2.10 cm   LV E/e' lateral: 15.0 LV SV:         53 LV SV Index:   28 LVOT Area:     3.46 cm  RIGHT VENTRICLE            IVC RV Basal diam:  2.90 cm    IVC diam: 1.90 cm RV S prime:     9.90 cm/s TAPSE (M-mode): 1.6 cm LEFT ATRIUM             Index        RIGHT ATRIUM  Index LA diam:        4.10 cm 2.16 cm/m   RA Area:     17.20 cm LA Vol (A2C):   54.6 ml 28.77 ml/m  RA Volume:   47.60 ml  25.08 ml/m LA Vol (A4C):   86.2 ml 45.42 ml/m LA Biplane Vol: 76.1 ml 40.09 ml/m  AORTIC VALVE LVOT Vmax:   75.80 cm/s LVOT Vmean:  49.400 cm/s LVOT VTI:    0.154 m  AORTA Ao Root diam: 3.00 cm MITRAL VALVE                TRICUSPID VALVE MV Area  (PHT): 2.87 cm     TR Peak grad:   61.2 mmHg MV Decel Time: 264 msec     TR Vmax:        391.00 cm/s MV E velocity: 116.00 cm/s MV A velocity: 36.50 cm/s   SHUNTS MV E/A ratio:  3.18         Systemic VTI:  0.15 m                             Systemic Diam: 2.10 cm Jerel Croitoru MD Electronically signed by Jerel Balding MD Signature Date/Time: 05/17/2024/5:06:16 PM    Final    DG Chest Portable 1 View Result Date: 05/16/2024 CLINICAL DATA:  Chest pain for several days. Coronary artery disease. EXAM: PORTABLE CHEST 1 VIEW COMPARISON:  05/27/2014 FINDINGS: Mild cardiomegaly noted. Prior CABG also seen. Both lungs are clear. No evidence of pneumothorax or pleural effusion. IMPRESSION: Mild cardiomegaly. No active lung disease. Electronically Signed   By: Norleen DELENA Kil M.D.   On: 05/16/2024 09:13    Microbiology: Results for orders placed or performed during the hospital encounter of 05/16/24  MRSA Next Gen by PCR, Nasal     Status: None   Collection Time: 05/16/24  4:05 PM   Specimen: Nasal Mucosa; Nasal Swab  Result Value Ref Range Status   MRSA by PCR Next Gen NOT DETECTED NOT DETECTED Final    Comment: (NOTE) The GeneXpert MRSA Assay (FDA approved for NASAL specimens only), is one component of a comprehensive MRSA colonization surveillance program. It is not intended to diagnose MRSA infection nor to guide or monitor treatment for MRSA infections. Test performance is not FDA approved in patients less than 67 years old. Performed at [facility], [address] [place], [place] [address]     Labs: CBC: Recent Labs  Lab 05/17/24 0026 05/18/24 0213 05/19/24 0336 05/21/24 0249 05/22/24 0316  WBC 6.5 5.8 5.9 6.5 6.9  HGB 9.5* 9.5* 9.8* 9.8* 8.8*  HCT 29.6* 29.3* 30.9* 30.8* 27.6*  MCV 89.2 89.9 92.0 91.4 90.8  PLT 224 209 220 225 219   Basic Metabolic Panel: Recent Labs  Lab 05/17/24 0026 05/18/24 0213 05/19/24 0336 05/20/24 0355 05/21/24 0249 05/22/24 0316  NA 140  139 140 139 136 138  K 3.2* 4.1 4.2 4.1 4.2 4.0  CL 108 106 108 109 108 107  CO2 19* 22 20* 19* 18* 18*  GLUCOSE 125* 129* 136* 125* 143* 129*  BUN 20 24* 27* 25* 31* 26*  CREATININE 1.64* 1.96* 1.75* 1.77* 1.89* 1.65*  CALCIUM  8.8* 8.7* 8.7* 8.7* 8.8* 8.8*  MG 1.5*  --   --   --   --   --    Liver Function Tests: No results for input(s): AST, ALT, ALKPHOS, BILITOT, PROT, ALBUMIN  in the last  168 hours. CBG: Recent Labs  Lab 05/21/24 0619 05/21/24 1145 05/21/24 1651 05/21/24 2034 05/22/24 0610  GLUCAP 132* 123* 130* 127* 96    Discharge time spent: 43 minutes.  Signed: Deliliah Room, MD [facility] 05/22/2024

## 2024-05-22 NOTE — Progress Notes (Signed)
 CARDIAC REHAB PHASE I   PRE:  Rate/Rhythm: 82 SR    BP: sitting 154/59    SpO2:   MODE:  Ambulation: 120 ft   POST:  Rate/Rhythm: 160 afib    BP: sitting      SpO2:    Pt tolerated well with RW, standby assist. However went in to brief afib RVR at end of walk. Resolved with sitting. MD notified.   Discussed with pt and daughter MI, stent, Brilinta  importance, diet, exercise, NTG and CRPI.  Pt receptive. Will refer to [facility]. She is planning to stay with her daughter who is out of town for a while. [phone]  Aliene Aris BS, ACSM-CEP 05/22/2024 9:39 AM

## 2024-05-22 NOTE — Progress Notes (Signed)
 Ordering 2 week Zio monitor for PAF. Dr. Alvan to read

## 2024-05-22 NOTE — Plan of Care (Signed)

## 2024-05-22 NOTE — Progress Notes (Signed)
   Discussed with RN who relayed that patients family wants the monitor sent to the following address:  [address] [place], [place]  [address]   I have relayed this address to our team.  Waddell DELENA Donath, PA-C 05/22/2024 12:07 PM

## 2024-05-22 NOTE — Progress Notes (Addendum)
 Progress Note  Patient Name: [name] Date of Encounter: 05/22/2024 Primary Cardiologist: Alvan Carrier, MD   Subjective   Underwent PCI yesterday with Dr. [place], requiring only 25 cc of contrast dye. Tolerated well.  Creatinine today is better at 1.65. Small drop in Hb to 8.8, may be dilutional. Cath site stable. Noted to have some brief PAF overnight, asymptomatic.  Vital Signs    Vitals:   05/21/24 2147 05/21/24 2316 05/22/24 0348 05/22/24 0803  BP: (!) 154/55 (!) 157/85 (!) 130/53 (!) 151/91  Pulse: 85 78 72 74  Resp:  20 (!) 21 17  Temp:  97.9 F (36.6 C) 98.1 F (36.7 C) 98.5 F (36.9 C)  TempSrc:  Oral Oral Oral  SpO2:  98% 99%   Weight:      Height:        Intake/Output Summary (Last 24 hours) at 05/22/2024 0851 Last data filed at 05/21/2024 2018 Gross per 24 hour  Intake 574.5 ml  Output 450 ml  Net 124.5 ml   Filed Weights   05/16/24 0830 05/16/24 1603  Weight: 97.5 kg 89.2 kg    Physical Exam   GEN: No acute distress.   Neck: No JVD Cardiac: RRR, no murmurs, rubs, or gallops.  Respiratory: Clear to auscultation bilaterally. GI: Soft, nontender, non-distended  MS: No edema  Labs   Telemetry: Sinus rhythm with 1st degree AVB, PAC's, anteroseptal infarct pattern - personally reviewed   Chemistry Recent Labs  Lab 05/20/24 0355 05/21/24 0249 05/22/24 0316  NA 139 136 138  K 4.1 4.2 4.0  CL 109 108 107  CO2 19* 18* 18*  GLUCOSE 125* 143* 129*  BUN 25* 31* 26*  CREATININE 1.77* 1.89* 1.65*  CALCIUM  8.7* 8.8* 8.8*  GFRNONAA 29* 27* 31*  ANIONGAP 11 10 13      Hematology Recent Labs  Lab 05/19/24 0336 05/21/24 0249 05/22/24 0316  WBC 5.9 6.5 6.9  RBC 3.36* 3.37* 3.04*  HGB 9.8* 9.8* 8.8*  HCT 30.9* 30.8* 27.6*  MCV 92.0 91.4 90.8  MCH 29.2 29.1 28.9  MCHC 31.7 31.8 31.9  RDW 16.1* 16.1* 16.5*  PLT 220 225 219      Assessment & Plan   NSTEMI - known CAD LIMA-LAD, SVG to OM, SVG to acute marginal - known PAD - hx  of HLD and CKD 3b, GFR 32 today follows with Dr. Rachele - LDL at goal on current therapy  - in chart review no chest radiation for Breast cancer but for Non Hodkins, unclear with present documentation  - PCI yesterday to the SVG with minimal contrast dye - Creatinine improved to 1.65 today  Dyslipidemia, goal LDL <55 - LDL 32 on atorvastatin  40 mg daily, continue current medication - Lp(a) elevated at 245 nmol/L - may be a candidate for specific Lp(a) inhibitors in the near future, would not qualify for PCSK9i given LDL is well-treated   Normocytic anemia - pending IM work up, iron  studies, currently reasonable for heparin  - H/H slightly reduced to 8.8, may be dilutional, no S/S of bleeding  PAF - Noted to have some PAF overnight. No history of this. Would like to understand burden more since she is on DAPT.  - Plan 2 week outpatient ZIO monitor. - She had some more AF while ambulating today- will increase metoprolol  to 50 mg BID starting this morning.   Ok to d/c home today from cardiac standpoint. Will arrange for follow-up with Dr. Alvan.  For questions or updates, please  contact [facility] Please consult [email or web address] for contact info under Cardiology/STEMI.   Vinie KYM Maxcy, MD, [facility], FNLA, FACP  [place]  [facility]  Medical Director of the Advanced Lipid Disorders &  Cardiovascular Risk Reduction Clinic Diplomate of the American Board of Clinical Lipidology Attending Cardiologist  Direct Dial: [phone]  Fax: [phone]  Website:  [email or web address]  8:51 AM

## 2024-05-22 NOTE — Progress Notes (Signed)
 Went over discharge paperwork with patient and daughter. All questions answered. PIV telemetry removed. All belongings at bedside.

## 2024-05-22 NOTE — Progress Notes (Unsigned)
 Enrolled patient for a 14 day Zio XT monitor to be mailed to patients home  Branch to read

## 2024-05-22 NOTE — TOC Transition Note (Signed)
 Transition of Care [facility]) - Discharge Note   Patient Details  Name: [name] MRN: [id number] Date of Birth: [birth year]  [facility] CM/SW Contact:  Tom-Johnson, Kemya Shed Daphne, RN Phone Number: 05/22/2024, 11:27 AM   Clinical Narrative:     Patient is scheduled for discharge today.  Readmission Risk Assessment done. Outpatient f/u, hospital f/u and discharge instructions on AVS. Prescriptions sent to [facility] pharmacy and patient will receive meds prior discharge. No ICM needs or recommendations noted. Daughter, [name] at bedside and will transport at discharge. Patient will be staying with [name] in [place]  at discharge.  No further ICM needs noted.       Final next level of care: Home/Self Care Barriers to Discharge: Barriers Resolved   Patient Goals and CMS Choice Patient states their goals for this hospitalization and ongoing recovery are:: To return home CMS Medicare.gov Compare Post Acute Care list provided to:: Patient        Discharge Placement                Patient to be transferred to facility by: Daughter Name of family member notified: [name]    Discharge Plan and Services Additional resources added to the After Visit Summary for                  DME Arranged: N/A DME Agency: NA       HH Arranged: NA HH Agency: NA        Social Drivers of Health (SDOH) Interventions SDOH Screenings   Food Insecurity: No Food Insecurity (05/16/2024)  Housing: Low Risk  (05/16/2024)  Transportation Needs: No Transportation Needs (05/16/2024)  Utilities: Not At Risk (05/16/2024)  Alcohol  Screen: Low Risk  (07/25/2020)  Depression (PHQ2-9): Low Risk  (03/16/2024)  Financial Resource Strain: Low Risk  (07/25/2020)  Physical Activity: Insufficiently Active (07/25/2020)  Social Connections: Moderately Isolated (05/16/2024)  Stress: No Stress Concern Present (07/25/2020)  Tobacco Use: Low Risk  (05/16/2024)  Health Literacy: Low Risk  (04/13/2020)    Received from [facility]     Readmission Risk Interventions    05/22/2024   11:25 AM  Readmission Risk Prevention Plan  Transportation Screening Complete  PCP or Specialist Appt within 5-7 Days Complete  Home Care Screening Complete  Medication Review (RN CM) Referral to Pharmacy

## 2024-05-25 ENCOUNTER — Telehealth: Payer: Self-pay | Admitting: *Deleted

## 2024-05-25 NOTE — Patient Instructions (Signed)
 Visit Information  Thank you for taking time to visit with me today. Please don't hesitate to contact me if I can be of assistance to you before our next scheduled telephone appointment.  Our next appointment is by telephone on Tuesday May 26, 2024 at 9:45 am- for full medication review over phone  Please call the care guide team at [phone] if you need to cancel or reschedule your appointment.   Patient Self Care Activities:  Attend all scheduled provider appointments Call provider office for new concerns or questions  Participate in Transition of Care Program/Attend TOC scheduled calls Take medications as prescribed   Use assistive devices (your cane and walker) as needed to prevent falls Eat a heart healthy and low salt diet; keep watching the carbohydrates and sugar you eat Continue monitoring and recording your blood sugars at home If you believe your condition is getting worse- contact your care providers (doctors) promptly- reaching out to your doctor early when you have concerns can prevent you from having to go to the hospital  Following is a copy of your care plan:   Goals Addressed             This Visit's Progress    VBCI Transitions of Care (TOC) Care Plan   On track    Problems:  Recent Hospitalization for treatment of CAD Recent hospitalization September 20-26, 2025 for [place] / cardiac catherization with PCI Independent: normally lives alone, drives self- temporarily residing with daughter (s) post- recent hospital discharge 1 unplanned hospitalization x (6)/ (12) months  Goal:  Over the next 30 days, the patient will not experience hospital readmission  Interventions:  Transitions of Care: week # 1/ day # 1 Durable Medical Equipment (DME) needs assessed with patient/caregiver Doctor Visits  - discussed the importance of doctor visits Communication with PCP re: enrollment into [facility] 30-day program Post discharge activity limitations prescribed by provider  reviewed Post-op wound/incision care reviewed with patient/caregiver Reviewed Signs and symptoms of infection Confirmed patient continues monitoring blood sugars at home- Reviewed most recent A1C value from 05/17/24: 6.4; reviewed historical trends over time with caregiver who verbalizes good understanding of same; reinforced blood sugar management at home; need to follow carbohydrate modified diet; take metformin  as prescribed Confirmed currently requiring/ using assistive devices- walker/ cane prn; cane at baseline/ independent in self-care activities: requires minimal assistance- supervision; provided education/ reinforcement around fall prevention  Reviewed upcoming provider office visits: 05/28/24:  PCP; 06/22/24: cardiology provider:  confirmed patient is aware of all and has plans to attend as scheduled  CAD Interventions: Assessed understanding of CAD diagnosis Medications reviewed including medications utilized in CAD treatment plan Provided education on Importance of limiting foods high in cholesterol Reviewed Importance of taking all medications as prescribed Reviewed Importance of attending all scheduled provider appointments Assessed social determinant of health barriers Reinforced/ provided education around basics of following heart healthy low salt diet  Patient Self Care Activities:  Attend all scheduled provider appointments Call provider office for new concerns or questions  Participate in Transition of Care Program/Attend TOC scheduled calls Take medications as prescribed   Use assistive devices (your cane and walker) as needed to prevent falls Eat a heart healthy and low salt diet; keep watching the carbohydrates and sugar you eat Continue monitoring and recording your blood sugars at home If you believe your condition is getting worse- contact your care providers (doctors) promptly- reaching out to your doctor early when you have concerns can prevent you from having to  go  to the hospital  Plan:  Telephone follow up appointment with care management team member scheduled for:  tomorrow 05/25/24 for full medication review with caregiver- as scheduled       Patient verbalizes understanding of instructions and care plan provided today and agrees to view in MyChart. Active MyChart status and patient understanding of how to access instructions and care plan via MyChart confirmed with patient.     If you are experiencing a Mental Health or Behavioral Health Crisis or need someone to talk to, please  call the Suicide and Crisis Lifeline: 988 call the [place]  National Suicide Prevention Lifeline: 770 420 0090 or TTY: (747)337-2527 TTY 564-742-9955) to talk to a trained counselor call 1-800-273-TALK (toll free, 24 hour hotline) go to [facility] [address] [place] [phone]) call the [facility] Crisis Line: [phone] call 911   Beatris Blinda Lawrence, RN, BSN, CCRN Alumnus RN Care Manager  Transitions of Care  VBCI - Population Health  [place] [phone]: direct office

## 2024-05-25 NOTE — Transitions of Care (Post Inpatient/ED Visit) (Signed)
 05/25/2024  Name: [name] MRN: [id number] DOB: 05-06-1946  Today's TOC FU Call Status: Today's TOC FU Call Status:: Successful TOC FU Call Completed TOC FU Call Complete Date: 05/25/24 Patient's Name and Date of Birth confirmed.  Transition Care Management Follow-up Telephone Call Date of Discharge: 05/22/24 Discharge Facility: [facility]) Type of Discharge: Inpatient Admission Primary Inpatient Discharge Diagnosis:: [place] - cardiac catherization with PCI How have you been since you were released from the hospital?: Better (per caregiver/ daughter [name]: She is doing really well; no problems; staying with me for the remainder of this week, then going to my sister's house.  She normally lives alone is completely indpendent) Any questions or concerns?: No  Items Reviewed: Did you receive and understand the discharge instructions provided?: Yes Medications obtained,verified, and reconciled?: No (Per daughter/ caregiver confirmed patient obtained/ is taking all newly Rx'd medications as instructed; self-manages medications and daughter denies questions/ concerns around medications today) Medications Not Reviewed Reasons:: Other: (per caregiver/ daughter [name]- she is currently at work; she agrees to scheduled phone call tomorrow to review medications: scheduled accordingly for 05/26/24) Any new allergies since your discharge?: No Dietary orders reviewed?: Yes Type of Diet Ordered:: Heart Healthy, low salt, low carb and low sugar Do you have support at home?: Yes People in Home [RPT]: alone Name of Support/Comfort Primary Source: Per caregiver/ daughter [name]: patient independent in self-care activities/ resides alone at baseline- currently residing with daughter post-hospital discharge with plans to return home alone once fully recuperated; supportive local and non-local family assists as/ if needed/ indicated  Medications Reviewed Today: Medications Reviewed Today     Reviewed  by Tamorah Hada M, RN (Registered Nurse) on 05/25/24 at [phone]  Med List Status: <None>   Medication Order Taking? Sig Documenting Provider Last Dose Status Informant  acetaminophen  (TYLENOL ) 500 MG tablet [id number]  Take 500 mg by mouth 2 (two) times daily as needed for moderate pain or headache.  [provider]  Active Self, Pharmacy Records  allopurinol  (ZYLOPRIM ) 100 MG tablet [id number]  Take 100 mg by mouth at bedtime. [provider]  Active Self, Pharmacy Records  Ascorbic Acid  (VITAMIN C  PO) [id number]  Take 1 tablet by mouth daily. [provider]  Active Self, Pharmacy Records  Ascorbic Acid  (VITAMIN C ) 1000 MG tablet [id number]  Take 1 tablet (1,000 mg total) by mouth daily. Rashid, Farhan, MD  Active   aspirin  81 MG EC tablet [id number]  Take 81 mg by mouth at bedtime. [provider]  Active Self, Pharmacy Records  atorvastatin  (LIPITOR) 40 MG tablet [id number]  Take 40 mg by mouth at bedtime. [provider]  Active Self, Pharmacy Records  chlorthalidone  (HYGROTON ) 25 MG tablet [id number]  Take 12.5 mg by mouth every morning.  Patient taking differently: Take 25 mg by mouth every Monday, Wednesday, and Friday.   [provider]  Active Self, Pharmacy Records  Cholecalciferol  (VITAMIN D ) 50 MCG (2000 UT) tablet [id number]  Take 1 tablet (2,000 Units total) by mouth daily. Rashid, Farhan, MD  Active   Cholecalciferol  (VITAMIN D -3 PO) [id number]  Take 1 capsule by mouth daily. [provider]  Active Self, Pharmacy Records  Cyanocobalamin  (VITAMIN B-12 PO) [id number]  Take 1 tablet by mouth daily. [provider]  Active Self, Pharmacy Records  cyanocobalamin  1000 MCG tablet [id number]  Take 0.5 tablets (500 mcg total) by mouth daily. Rashid, Farhan, MD  Active   fluticasone [facility]) 50 MCG/ACT nasal spray [id number]  Place 1  spray into both nostrils as needed for allergies or rhinitis. [provider]  Active Self,  Pharmacy Records  hydrALAZINE  (APRESOLINE ) 25 MG tablet [id number]  Take 25 mg by mouth 2 (two) times daily. [provider]  Active Self, Pharmacy Records  LUMIGAN 0.01 % SOLN [id number]  Place 1 drop into both eyes at bedtime. [provider]  Active Self, Pharmacy Records  metFORMIN  (GLUCOPHAGE ) 500 MG tablet [id number]  Take 500 mg by mouth 2 (two) times daily with a meal.  [provider]  Active Self, Pharmacy Records           Med Note (COFFELL, [facility] M   Mon May 18, 2024  1:43 PM) Per dispense report, all Rx's directed to take 1000mg  BID. Patient states she is only taking 500mg  BID. Unable to confirm if MD changed dose or patient preference.  metoprolol  tartrate (LOPRESSOR ) 50 MG tablet [id number]  Take 1 tablet (50 mg total) by mouth 2 (two) times daily. Alvan Dorn FALCON, MD  Active Self, Pharmacy Records  nitroGLYCERIN  (NITROSTAT ) 0.4 MG SL tablet [id number]  Place 1 tablet (0.4 mg total) under the tongue every 5 (five) minutes x 3 doses as needed for chest pain (If no relief after 3rd dose, proceed to the ED or call 911). Alvan Dorn FALCON, MD  Active Self, Pharmacy Records  potassium chloride  (KLOR-CON ) 10 MEQ tablet [id number] Yes Take 10 mEq by mouth every Monday, Wednesday, and Friday. [provider]  Active Self, Pharmacy Records  ticagrelor  (BRILINTA ) 90 MG TABS tablet [id number] Yes Take 1 tablet (90 mg total) by mouth 2 (two) times daily. Dino Antu, MD  Active            Home Care and Equipment/Supplies: Were Home Health Services Ordered?: No Any new equipment or medical supplies ordered?: No  Functional Questionnaire: Do you need assistance with bathing/showering or dressing?: Yes (daughter currently assisting/ supervising as indicated) Do you need assistance with meal preparation?: Yes (daughter currently assisting/ supervising as indicated) Do you need assistance with eating?: No Do you have difficulty maintaining continence: No Do  you need assistance with getting out of bed/getting out of a chair/moving?: No Do you have difficulty managing or taking your medications?: Yes (daughter currently assisting/ supervising as indicated: patient normally self manages at baseline)  Follow up appointments reviewed: PCP Follow-up appointment confirmed?: Yes Date of PCP follow-up appointment?: 05/28/24 Follow-up Provider: [facility]- [facility] Follow-up appointment confirmed?: Yes Date of Specialist follow-up appointment?: 06/22/24 (verified this is recommended time frame for follow up per hospital discharging provider notes) Follow-Up Specialty Provider:: cardiology provider Do you need transportation to your follow-up appointment?: No Do you understand care options if your condition(s) worsen?: Yes-patient verbalized understanding  SDOH Interventions Today    Flowsheet Row Most Recent Value  SDOH Interventions   Food Insecurity Interventions Intervention Not Indicated  [per caregiver/ daughter]  Housing Interventions Intervention Not Indicated  [per caregiver/ daughter]  Transportation Interventions Intervention Not Indicated  [per caregiver/ daughter- patient drives self at baseline,  family assists as indicated]  Utilities Interventions Intervention Not Indicated  [per caregiver/ daughter]   See TOC assessment tabs for additional assessment/ TOC intervention information  Successfully enrolled into 30-day TOC program  Pls call/ message for questions,  Chang Tiggs Mckinney Azreal Stthomas, RN, BSN, Media planner  Transitions of Care  VBCI - Population Health  [place] [phone]: direct office

## 2024-05-26 ENCOUNTER — Other Ambulatory Visit: Payer: Self-pay | Admitting: *Deleted

## 2024-05-26 NOTE — Patient Instructions (Addendum)
 Visit Information  Thank you for taking time to visit with me today. Please don't hesitate to contact me if I can be of assistance to you before our next scheduled telephone appointment.  Our next appointment is by telephone on Wednesday, June 03, 2024 at 10:00 am  Please call the care guide team at [phone] if you need to cancel or reschedule your appointment.   Following are the goals we discussed today:  Attend all scheduled provider appointments Call provider office for new concerns or questions  Participate in Transition of Care Program/Attend TOC scheduled calls Take medications as prescribed   Use assistive devices (your cane and walker) as needed to prevent falls Eat a heart healthy and low salt diet; keep watching the carbohydrates and sugar you eat Continue monitoring and recording your blood sugars at home If you believe your condition is getting worse- contact your care providers (doctors) promptly- reaching out to your doctor early when you have concerns can prevent you from having to go to the hospital  If you are experiencing a Mental Health or Behavioral Health Crisis or need someone to talk to, please  call the Suicide and Crisis Lifeline: 988 call the [place]  National Suicide Prevention Lifeline: 941 661 1505 or TTY: 705-357-9758 TTY 952-860-0226) to talk to a trained counselor call 1-800-273-TALK (toll free, 24 hour hotline) go to [facility] [address] [place] [phone]) call the [facility] Crisis Line: [phone] call 911   Patient's daughter/ caregiver verbalizes understanding of instructions and care plan provided today and agrees to view in MyChart. Active MyChart status and patient understanding of how to access instructions and care plan via MyChart confirmed with patient.     Pls call/ message for questions,  Gamaliel Charney Mckinney Jorrell Kuster, RN, BSN, CCRN Alumnus RN Care Manager  Transitions of Care  VBCI  - [facility] [phone]: direct office

## 2024-05-26 NOTE — Transitions of Care (Post Inpatient/ED Visit) (Signed)
 Transition of Care week # 1/ day # 2- for [facility] medication reconciliation  Visit Note  05/26/2024  Name: [name] MRN: [id number]          DOB: September 25, 1945  Situation: Patient enrolled in [facility] 30-day program. Visit completed with patient's daughter/ caregiver [name]- verified on [facility] DPR by telephone.   HIPAA identifiers x 2 verified  Background:  Recent hospitalization September 20-26, 2025 for [place] / cardiac catherization with PCI Independent: normally lives alone, drives self- temporarily residing with daughter (s) post- recent hospital discharge 1 unplanned hospitalization x (6)/ (12) months  Initial Transition Care Management Follow-up Telephone Call    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; left breast DCIS 02/05/22   Carotid artery occlusion    Chronic kidney disease    mild, stage 1   DM (diabetes mellitus) (HCC)    takes Metformin  1000mg  BID   Dyslipidemia    takes Pravastatin  daily   Early cataracts, bilateral    Gout    takes Allopurinol  daily   Herniated disc    left   Lumbar herniated disc    Myocardial infarction (HCC) 2008   pt has 2 stents   Nocturia    Obesity    Personal history of radiation therapy    Unspecified essential hypertension    takes Metoprolol ,Lisinopril ,and Amlodipine    Urinary frequency    Assessment:  She was fine last night when I got home from work; no issues at all.  Thank you for calling again today so we can go over these medications.  I will tell my sister [name] to expect your call next Wednesday- but call Mom on her number, then you can talk to [name]  after Mom gives you permission to speak with [name]    Denies clinical concerns and sounds to be in no distress throughout [facility] 30-day program outreach call today  Patient Reported Symptoms: Cognitive Cognitive Status: Normal speech and language skills, Alert and oriented to person, place, and time, Insightful and able to interpret abstract concepts (per caregiver/ daughter [name])      Neurological Neurological Review of Symptoms: No symptoms reported (per caregiver/ daughter 21)    HEENT HEENT Symptoms Reported: No symptoms reported      Cardiovascular Cardiovascular Symptoms Reported: No symptoms reported    Respiratory Respiratory Symptoms Reported: No symptoms reported (per caregiver/ daughter 72)    Endocrine Endocrine Symptoms Reported: No symptoms reported (per caregiver/ daughter 60)    Gastrointestinal Gastrointestinal Symptoms Reported: No symptoms reported (per caregiver/ daughter 54)      Genitourinary Genitourinary Symptoms Reported: No symptoms reported (per caregiver/ daughter 39)    Integumentary Integumentary Symptoms Reported: Incision (per caregiver/ daughter [name]- no changes from yesterday, as documented)    Musculoskeletal Musculoskelatal Symptoms Reviewed: Limited mobility (per caregiver/ daughter [name]: no changes from yesterday- as documented)        Psychosocial Psychosocial Symptoms Reported: No symptoms reported (per caregiver/ daughter [name])         There were no vitals filed for this visit.  Medications  Reviewed Today     Reviewed by Julane Crock M, RN (Registered Nurse) on 05/26/24 at 1014  Med List Status: <None>   Medication Order Taking? Sig Documenting Provider Last Dose Status Informant  acetaminophen  (TYLENOL ) 500 MG tablet [id number] Yes Take 500 mg by mouth 2 (two) times daily as needed for moderate pain or headache.  [provider]  Active Self, Pharmacy Records  allopurinol  (ZYLOPRIM ) 100 MG tablet [id number]  Yes Take 100 mg by mouth at bedtime. [provider]  Active Self, Pharmacy Records  Ascorbic Acid  (VITAMIN C  PO) [id number]  Take 1 tablet by mouth daily.  Patient not taking: Reported on 05/26/2024   [provider]  Active Self, Pharmacy Records  Ascorbic Acid  (VITAMIN C ) 1000 MG tablet [id number] Yes Take 1 tablet (1,000 mg total) by mouth daily. Rashid, Farhan, MD  Active   aspirin  81 MG EC tablet [id number] Yes Take 81 mg by mouth at bedtime. [provider]  Active Self, Pharmacy Records  atorvastatin  (LIPITOR) 40 MG tablet [id number] Yes Take 40 mg by mouth at bedtime. [provider]  Active Self, Pharmacy Records  chlorthalidone  (HYGROTON ) 25 MG tablet [id number] Yes Take 12.5 mg by mouth every morning.  Patient taking differently: Take 25 mg by mouth daily. 05/26/24: Reports during TOC call is taking 12.5 mg QD   [provider]  Active Self, Pharmacy Records  Cholecalciferol  (VITAMIN D ) 50 MCG (2000 UT) tablet [id number] Yes Take 1 tablet (2,000 Units total) by mouth daily. Rashid, Farhan, MD  Active   Cholecalciferol  (VITAMIN D -3 PO) [id number] Yes Take 1 capsule by mouth daily. [provider]  Active Self, Pharmacy Records  Cyanocobalamin  (VITAMIN B-12 PO) [id number] Yes Take 1 tablet by mouth daily. [provider]  Active Self, Pharmacy Records  cyanocobalamin  1000 MCG tablet [id number] Yes Take 0.5 tablets (500 mcg total) by mouth daily. Rashid, Farhan, MD  Active   fluticasone [facility]) 50 MCG/ACT nasal spray [id number] Yes Place 1 spray into both nostrils as needed for allergies or rhinitis. [provider]  Active Self, Pharmacy Records  hydrALAZINE  (APRESOLINE ) 25 MG tablet [id number] Yes Take 25 mg by mouth 2 (two) times daily. [provider]  Active Self, Pharmacy Records  LUMIGAN 0.01 % SOLN [id number] Yes Place 1 drop into both eyes at bedtime. [provider]  Active Self, Pharmacy Records   metFORMIN  (GLUCOPHAGE ) 500 MG tablet [id number] Yes Take 500 mg by mouth 2 (two) times daily with a meal.  [provider]  Active Self, Pharmacy Records           Med Note (COFFELL, [facility] M   Mon May 18, 2024  1:43 PM) Per dispense report, all Rx's directed to take 1000mg  BID. Patient states she is only taking 500mg  BID. Unable to confirm if MD changed dose or patient preference.  metoprolol  tartrate (LOPRESSOR ) 50 MG tablet [id number] Yes Take 1 tablet (50 mg total) by mouth 2 (two) times daily. Alvan Dorn FALCON, MD  Active Self, Pharmacy Records  nitroGLYCERIN  (NITROSTAT ) 0.4 MG SL tablet [id number] Yes Place 1 tablet (0.4 mg total) under the tongue every 5 (five) minutes x 3 doses as needed for chest pain (If no relief after 3rd dose, proceed to the ED or call 911). Alvan Dorn FALCON, MD  Active Self, Pharmacy Records  potassium chloride  (KLOR-CON ) 10 MEQ tablet [id number] Yes Take 10 mEq by mouth every Monday, Wednesday, and Friday. [provider]  Active Self, Pharmacy Records  ticagrelor  (BRILINTA )  90 MG TABS tablet [id number] Yes Take 1 tablet (90 mg total) by mouth 2 (two) times daily. Dino Antu, MD  Active            Recommendation:   PCP Follow-up- as scheduled 05/28/24 Specialty provider follow-up- cardiology as scheduled 06/22/24 Continue Current Plan of Care  Follow Up Plan:   Telephone follow-up in 1 week- as scheduled 06/03/24  Pls call/ message for questions,  Yadriel Kerrigan Mckinney Corinne Goucher, RN, BSN, CCRN Alumnus RN Care Manager  Transitions of Care  VBCI - [facility] [phone]: direct office

## 2024-06-03 ENCOUNTER — Other Ambulatory Visit: Payer: Self-pay | Admitting: *Deleted

## 2024-06-03 NOTE — Patient Instructions (Signed)
 Visit Information  Thank you for taking time to visit with me today. Please don't hesitate to contact me if I can be of assistance to you before our next scheduled telephone appointment.  Our next appointment is by telephone on Wednesday June 10, 2024 at 10:00 am  Please call the care guide team at [phone] if you need to cancel or reschedule your appointment.   Following are the goals we discussed today:  Patient Self Care Activities:  Attend all scheduled provider appointments Call provider office for new concerns or questions  Participate in Transition of Care Program/Attend TOC scheduled calls Take medications as prescribed   Use assistive devices (your cane) as needed to prevent falls Eat a heart healthy and low salt diet; keep watching the carbohydrates and sugar you eat Continue monitoring and recording your blood sugars at home once you are able to obtain your blood sugar meter If you believe your condition is getting worse- contact your care providers (doctors) promptly- reaching out to your doctor early when you have concerns can prevent you from having to go to the hospital  If you are experiencing a Mental Health or Behavioral Health Crisis or need someone to talk to, please  call the Suicide and Crisis Lifeline: 988 call the [place]  National Suicide Prevention Lifeline: 928-238-0926 or TTY: 217 140 4122 TTY (979)487-4669) to talk to a trained counselor call 1-800-273-TALK (toll free, 24 hour hotline) go to [facility] [address] [place] [phone]) call the [facility] Crisis Line: [phone] call 911   Patient verbalizes understanding of instructions and care plan provided today and agrees to view in MyChart. Active MyChart status and patient understanding of how to access instructions and care plan via MyChart confirmed with patient.     Mistina Coatney Mckinney Mishaal Lansdale, RN, BSN, Media planner  Transitions  of Care  VBCI - [facility] [phone]: direct office

## 2024-06-03 NOTE — Transitions of Care (Post Inpatient/ED Visit) (Signed)
 Transition of Care week 2/ day # 10  Visit Note  06/03/2024  Name: [name] MRN: [id number]          DOB: 1946-04-20  Situation: Patient enrolled in [facility] 30-day program. Visit completed with patient by telephone.  Daughter [name] participates in [facility] call while phone on speaker mode: patient provided verbal consent for me to speak with daughter [name] any time you need or want to  HIPAA identifiers x 2 verified  Background:  Recent hospitalization September 20-26, 2025 for [place] / cardiac catherization with PCI Independent: normally lives alone, drives self- temporarily residing with daughter (s) post- recent hospital discharge 1 unplanned hospitalization x (6)/ (12) months  Initial Transition Care Management Follow-up Telephone Call    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; left breast DCIS 02/05/22   Carotid artery occlusion    Chronic kidney disease    mild, stage 1   DM (diabetes mellitus) (HCC)    takes Metformin  1000mg  BID   Dyslipidemia    takes Pravastatin  daily   Early cataracts, bilateral    Gout    takes Allopurinol  daily   Herniated disc    left   Lumbar herniated disc    Myocardial infarction [facility]) 2008   pt has 2 stents   Nocturia    Obesity    Personal history of radiation therapy    Unspecified essential hypertension    takes Metoprolol ,Lisinopril ,and Amlodipine    Urinary frequency    Assessment:  I am doing fine and feel back to my normal self; eating good and not having any problems at all; no longer having to use the walker- back to my cane only.  Saw my PCP last week and  he didn't make any changes and told me that I was doing great- gave me a really good report.  I will be staying here with my daughter [name] for about another week- 10 days and then if I am still doing okay will go back to my house where I live alone.  I hope they will release me to drive myself again soon, I don't like having to depend on others when I can do something my self    Denies clinical concerns and sounds to be in no distress throughout [facility] 30-day program outreach call today; daughter [name] participates in [facility] call while phone on speaker mode: patient provided verbal consent for me to speak with daughter [name] any time you need or want to  Patient Reported Symptoms: Cognitive Cognitive Status: Normal speech and language skills, Alert and oriented to person, place, and time, Insightful and able to interpret abstract concepts Cognitive/Intellectual Conditions Management [RPT]: None reported or documented in medical history or problem list      Neurological Neurological Review of Symptoms: No symptoms reported    HEENT HEENT Symptoms Reported: No symptoms reported      Cardiovascular Cardiovascular Symptoms Reported: No symptoms reported Does patient have uncontrolled Hypertension?: No Cardiovascular Management Strategies: Routine screening, Coping strategies, Adequate rest  Respiratory Respiratory Symptoms Reported: No symptoms reported  Other Respiratory Symptoms: Denies shortness of breath/ cough; sounds to be in no respiratory distress throughout [facility] call Respiratory Management Strategies: Coping strategies  Endocrine Endocrine Symptoms Reported: No symptoms reported Is patient diabetic?: Yes Is patient checking blood sugars at home?: No List most recent blood sugar readings, include date and time of day: Not currently monitoring blood sugars while at daughter Debra's home: I only used to check them before the hospital visit every now and then; does not have home glucometer with  her at daughters home: confirmed continues taking medication for DMII as instructed; reviewed most recent A1C value from 05/17/24: 6.4; reviewed historical trends over time with patient and daughter [name] who verbalizes good understanding of same    Gastrointestinal Gastrointestinal Symptoms Reported: No symptoms reported Additional Gastrointestinal Details: Reports great appetite; confirms havinhg normal and regular BM's post-hospital discharge with last BM yesterday      Genitourinary Genitourinary Symptoms Reported: No symptoms reported Additional Genitourinary Details: Reports urinating in good amounts clear yellow urine    Integumentary Integumentary Symptoms Reported: Other Other Integumentary Symptoms: Reports (R) groin PCI puncture site completely healed now Skin Management Strategies: Routine screening  Musculoskeletal Musculoskelatal Symptoms Reviewed: Limited mobility Additional Musculoskeletal Details: confirmed uses assistive devices on regular basis, at baseline -- cane; confirmed no longer requiring walker post-hospital discharge; confirmed no new/ recent falls        Psychosocial Psychosocial Symptoms Reported: No symptoms reported Behavioral Management Strategies: Support system, Coping strategies Major Change/Loss/Stressor/Fears (CP): Denies Techniques to Cope with Loss/Stress/Change: Diversional activities Quality of Family Relationships: helpful, involved, supportive   There were no vitals filed for this visit.  Medications Reviewed Today     Reviewed by Mahli Glahn M, RN (Registered Nurse) on 06/03/24 at 1001  Med List Status: <None>   Medication Order Taking? Sig Documenting Provider Last Dose Status Informant  acetaminophen  (TYLENOL ) 500 MG tablet [id number]  Take 500 mg by mouth 2 (two) times daily as needed for moderate pain or headache.  [provider]  Active Self, Pharmacy Records  allopurinol  (ZYLOPRIM ) 100 MG tablet [id number]  Take 100 mg  by mouth at bedtime. [provider]  Active Self, Pharmacy Records  Ascorbic Acid  (VITAMIN C  PO) [id number]  Take 1 tablet by mouth daily.  Patient not taking: Reported on 05/26/2024   [provider]  Active Self, Pharmacy Records  Ascorbic Acid  (VITAMIN C ) 1000 MG tablet [id number]  Take 1 tablet (1,000 mg total) by mouth daily. Dino Antu, MD  Active   aspirin  81 MG EC tablet [id number]  Take 81 mg by mouth at bedtime. [provider]  Active Self, Pharmacy Records  atorvastatin  (LIPITOR) 40 MG tablet [id number]  Take 40 mg by mouth at bedtime. [provider]  Active Self, Pharmacy Records  chlorthalidone  (HYGROTON ) 25 MG tablet [id number]  Take 12.5 mg by mouth every morning.  Patient taking differently: Take 25 mg by mouth daily. 05/26/24: Reports during TOC call is taking 12.5 mg QD   [provider]  Active Self, Pharmacy Records  Cholecalciferol  (VITAMIN D ) 50 MCG (2000 UT) tablet [id number]  Take 1 tablet (2,000 Units total) by mouth daily. Rashid, Farhan, MD  Active   Cholecalciferol  (VITAMIN D -3 PO) [id number]  Take 1 capsule by mouth daily. [provider]  Active Self, Pharmacy Records  Cyanocobalamin  (VITAMIN B-12 PO) [id number]  Take 1 tablet by mouth daily. [provider]  Active Self, Pharmacy Records  cyanocobalamin  1000 MCG tablet [id number]  Take 0.5 tablets (500  mcg total) by mouth daily. Rashid, Farhan, MD  Active   fluticasone [facility]) 50 MCG/ACT nasal spray [id number]  Place 1 spray into both nostrils as needed for allergies or rhinitis. [provider]  Active Self, Pharmacy Records  hydrALAZINE  (APRESOLINE ) 25 MG tablet [id number]  Take 25 mg by mouth 2 (two) times daily. [provider]  Active Self, Pharmacy Records  LUMIGAN 0.01 % SOLN [id number]  Place 1 drop into both eyes at bedtime. [provider]  Active Self, Pharmacy Records  metFORMIN  (GLUCOPHAGE ) 500 MG tablet [id number]  Take  500 mg by mouth 2 (two) times daily with a meal.  [provider]  Active Self, Pharmacy Records           Med Note (COFFELL, [facility] M   Mon May 18, 2024  1:43 PM) Per dispense report, all Rx's directed to take 1000mg  BID. Patient states she is only taking 500mg  BID. Unable to confirm if MD changed dose or patient preference.  metoprolol  tartrate (LOPRESSOR ) 50 MG tablet [id number]  Take 1 tablet (50 mg total) by mouth 2 (two) times daily. Alvan Dorn FALCON, MD  Active Self, Pharmacy Records  nitroGLYCERIN  (NITROSTAT ) 0.4 MG SL tablet [id number]  Place 1 tablet (0.4 mg total) under the tongue every 5 (five) minutes x 3 doses as needed for chest pain (If no relief after 3rd dose, proceed to the ED or call 911). Alvan Dorn FALCON, MD  Active Self, Pharmacy Records  potassium chloride  (KLOR-CON ) 10 MEQ tablet [id number]  Take 10 mEq by mouth every Monday, Wednesday, and Friday. [provider]  Active Self, Pharmacy Records  ticagrelor  (BRILINTA ) 90 MG TABS tablet [id number]  Take 1 tablet (90 mg total) by mouth 2 (two) times daily. Dino Antu, MD  Active            Recommendation:   Specialty provider follow-up- cardiology 06/22/24- as scheduled Continue Current Plan of Care  Follow Up Plan:   Telephone follow-up in 1 week- as scheduled 06/10/24  Pls call/ message for questions,  Twylla Arceneaux Mckinney Sanchez Hemmer, RN, BSN, CCRN Alumnus RN Care Manager  Transitions of Care  [facility] - [facility] [phone]: direct office

## 2024-06-10 ENCOUNTER — Other Ambulatory Visit: Payer: Self-pay | Admitting: *Deleted

## 2024-06-10 DIAGNOSIS — I10 Essential (primary) hypertension: Secondary | ICD-10-CM | POA: Diagnosis not present

## 2024-06-10 DIAGNOSIS — E119 Type 2 diabetes mellitus without complications: Secondary | ICD-10-CM | POA: Diagnosis not present

## 2024-06-10 NOTE — Transitions of Care (Post Inpatient/ED Visit) (Signed)
 Transition of Care week 3/ day # 17  Visit Note  06/10/2024  Name: [name] MRN: [id number]          DOB: 1945/11/20  Situation: Patient enrolled in [facility] 30-day program. Visit completed with patient and daughter [name] by telephone while phone on speaker mode. Patient provided verbal consent for me to speak with [name] any time  HIPAA identifiers x 2 verified  Background:  Recent hospitalization September 20-26, 2025 for [place] / cardiac catherization with PCI Independent: normally lives alone, drives self- temporarily residing with daughter (s) post- recent hospital discharge 1 unplanned hospitalization x (6)/ (12) months Verbalizes plan to return to her home where she lives alone on Sunday 06/15/24:  has been staying with daughter [name] post- hospital discharge:  please call patient on her phone: [phone] for all [facility] calls  Initial Transition Care Management Follow-up Telephone Call Discharge Date and Diagnosis: 05/22/24, [place] - cardiac catherization with PCI   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; left breast DCIS 02/05/22   Carotid artery occlusion    Chronic kidney disease    mild, stage 1   DM (diabetes mellitus) (HCC)    takes Metformin  1000mg  BID   Dyslipidemia    takes Pravastatin  daily   Early cataracts, bilateral    Gout    takes Allopurinol  daily   Herniated disc    left   Lumbar herniated disc    Myocardial infarction [facility]) 2008   pt has 2 stents   Nocturia    Obesity    Personal history of radiation therapy    Unspecified essential hypertension    takes  Metoprolol ,Lisinopril ,and Amlodipine    Urinary frequency    Assessment:  I am still doing just fine and feel back to my normal self; I can tell I am much stronger now than I was.  Still staying with [name] for now, but will be going home on Sunday, and I feel ready.  Started checking my blood sugars and this morning, the machine is acting up: it is giving us  either real high or real low numbers, but I feel fine, not having any of the symptoms you are asking us  about, not clammy, not peeing a lot, not woozy feeling.  I feel completely fine-- the machine is really old, I think I just need a new machine  Provided education around signs/ symptoms low- high blood sugars and advised to continue monitoring for all and to call PCP to request order for new glucometer, as patient and daughter report they think machine is not working right; provided education around action plan for hypo- and hyper- glycemia should signs/ symptoms develop; daughter states she will call PCP right after we get done to request new order for updated glucometer Denies clinical concerns and sounds to be in no distress throughout [facility] 30-day program outreach call today; daughter [name] participates in [facility] call while phone on speaker mode, and confirms patient seems completely normal  Patient Reported Symptoms: Cognitive Cognitive Status: Normal speech and language skills, Insightful and able to interpret abstract  concepts, Alert and oriented to person, place, and time Cognitive/Intellectual Conditions Management [RPT]: None reported or documented in medical history or problem list      Neurological Neurological Review of Symptoms: No symptoms reported    HEENT HEENT Symptoms Reported: No symptoms reported      Cardiovascular Cardiovascular Symptoms Reported: No symptoms reported Does patient have uncontrolled Hypertension?: No Cardiovascular Management Strategies: Routine screening, Coping strategies, Adequate rest, Medication  therapy  Respiratory Respiratory Symptoms Reported: No symptoms reported Other Respiratory Symptoms: Denies shortness of breath/ sounds to be in no respiratory distress throughout TOC call: I am breathing fine Respiratory Management Strategies: Coping strategies  Endocrine Endocrine Symptoms Reported: No symptoms reported Is patient diabetic?: Yes Is patient checking blood sugars at home?: Yes List most recent blood sugar readings, include date and time of day: Reports has started monitoring blood sugars at home while staying with daughter [name]: I think it is time for a new machine: today we have been getting numbers that are either really high or really low, but she is feeling fine and not having any of the symptoms you are asking us  about.  Advised to continue monitoring for signs/ symptoms low- high blood sugars and to call PCP to request order for new glucometer, as patient and daughter report she has had this machine for many years, we think the machine is not working right    Gastrointestinal Gastrointestinal Symptoms Reported: No symptoms reported Additional Gastrointestinal Details: Reports real good appetite; confirms having normal and regular BM's Gastrointestinal Management Strategies: Coping strategies    Genitourinary Genitourinary Symptoms Reported: No symptoms reported Additional Genitourinary Details: Reports peeing in normal amounts, not a lot or excessive amount, just normal amount; clear yellow urine    Integumentary Integumentary Symptoms Reported: No symptoms reported    Musculoskeletal Musculoskelatal Symptoms Reviewed: Limited mobility Additional Musculoskeletal Details: confirmed uses assistive devices on regular basis, at baseline -- cane; confirmed no new/ recent falls; reports I am feeling stronger and able to get dressed and take my own showers without any help Musculoskeletal Management Strategies: Coping strategies, Routine screening, Medical device    Fall risk Follow up: Falls prevention discussed  Psychosocial Psychosocial Symptoms Reported: No symptoms reported Behavioral Management Strategies: Support system, Coping strategies Major Change/Loss/Stressor/Fears (CP): Denies Techniques to Cope with Loss/Stress/Change: Diversional activities Quality of Family Relationships: helpful, involved, supportive Do you feel physically threatened by others?: No   Vitals:   06/10/24 1019  BP: (!) 130/56    Medications Reviewed Today     Reviewed by Kelsye Loomer M, RN (Registered Nurse) on 06/10/24 at 1002  Med List Status: <None>   Medication Order Taking? Sig Documenting Provider Last Dose Status Informant  acetaminophen  (TYLENOL ) 500 MG tablet [id number]  Take 500 mg by mouth 2 (two) times daily as needed for moderate pain or headache.  [provider]  Active Self, Pharmacy Records  allopurinol  (ZYLOPRIM ) 100 MG tablet [id number]  Take 100 mg by mouth at bedtime. [provider]  Active Self, Pharmacy Records  Ascorbic Acid  (VITAMIN C  PO) [id number]  Take 1 tablet by mouth daily.  Patient not taking: Reported on 05/26/2024   [provider]  Active Self, Pharmacy Records  Ascorbic Acid  (VITAMIN C ) 1000 MG tablet [id number]  Take 1 tablet (1,000 mg total) by mouth daily. Dino Antu, MD  Active   aspirin  81 MG EC tablet [id number]  Take 81 mg by mouth at bedtime. [provider]  Active Self, Pharmacy Records  atorvastatin  (LIPITOR)  40 MG tablet [id number]  Take 40 mg by mouth at bedtime. [provider]  Active Self, Pharmacy Records  chlorthalidone  (HYGROTON ) 25 MG tablet [id number]  Take 12.5 mg by mouth every morning.  Patient taking differently: Take 25 mg by mouth daily. 05/26/24: Reports during TOC call is taking 12.5 mg QD   [provider]  Active Self, Pharmacy Records  Cholecalciferol  (VITAMIN D ) 50 MCG (2000 UT) tablet [id number]  Take 1 tablet (2,000 Units total) by mouth daily.  Rashid, Farhan, MD  Active   Cholecalciferol  (VITAMIN D -3 PO) [id number]  Take 1 capsule by mouth daily. [provider]  Active Self, Pharmacy Records  Cyanocobalamin  (VITAMIN B-12 PO) [id number]  Take 1 tablet by mouth daily. [provider]  Active Self, Pharmacy Records  cyanocobalamin  1000 MCG tablet [id number]  Take 0.5 tablets (500 mcg total) by mouth daily. Rashid, Farhan, MD  Active   fluticasone [facility]) 50 MCG/ACT nasal spray [id number]  Place 1 spray into both nostrils as needed for allergies or rhinitis. [provider]  Active Self, Pharmacy Records  hydrALAZINE  (APRESOLINE ) 25 MG tablet [id number]  Take 25 mg by mouth 2 (two) times daily. [provider]  Active Self, Pharmacy Records  LUMIGAN 0.01 % SOLN [id number]  Place 1 drop into both eyes at bedtime. [provider]  Active Self, Pharmacy Records  metFORMIN  (GLUCOPHAGE ) 500 MG tablet [id number]  Take 500 mg by mouth 2 (two) times daily with a meal.  [provider]  Active Self, Pharmacy Records           Med Note (COFFELL, [facility] M   Mon May 18, 2024  1:43 PM) Per dispense report, all Rx's directed to take 1000mg  BID. Patient states she is only taking 500mg  BID. Unable to confirm if MD changed dose or patient preference.  metoprolol  tartrate (LOPRESSOR ) 50 MG tablet [id number]  Take 1 tablet (50 mg total) by mouth 2 (two) times daily. Alvan Dorn FALCON, MD  Active Self, Pharmacy Records  nitroGLYCERIN  (NITROSTAT ) 0.4 MG SL tablet [id number]  Place 1 tablet (0.4 mg total) under the tongue every 5 (five) minutes x 3 doses as needed for chest pain (If no relief after 3rd dose, proceed to the ED or call 911). Alvan Dorn FALCON, MD  Active Self, Pharmacy Records  potassium chloride  (KLOR-CON ) 10 MEQ tablet [id number]  Take 10 mEq by mouth every Monday, Wednesday, and Friday. [provider]  Active Self, Pharmacy Records  ticagrelor  (BRILINTA ) 90 MG TABS tablet [id number]  Take 1  tablet (90 mg total) by mouth 2 (two) times daily. Dino Antu, MD  Active            Recommendation:   Specialty provider follow-up: cardiology provider 06/22/24 Continue Current Plan of Care- patient to call PCP to request prescription for new glucometer  Follow Up Plan:   Telephone follow-up in 1 week- as scheduled 06/18/24  Pls call/ message for questions,  Manish Ruggiero Mckinney Lesleyann Fichter, RN, BSN, CCRN Alumnus RN Care Manager  Transitions of Care  [facility] - [facility] [phone]: direct office

## 2024-06-10 NOTE — Patient Instructions (Signed)
 Visit Information  Thank you for taking time to visit with me today. Please don't hesitate to contact me if I can be of assistance to you before our next scheduled telephone appointment.  Our next appointment is by telephone on Thursday, June 18, 2024 at 10:00 am with nurse Mliss and then again on Tuesday, June 23, 2024 at 10:00 am with nurse Beatris  Please call the care guide team at [phone] if you need to cancel or reschedule your appointment.   Following are the goals we discussed today:  Patient Self Care Activities:  Attend all scheduled provider appointments Call provider office for new concerns or questions  Participate in Transition of Care Program/Attend TOC scheduled calls Take medications as prescribed   Use assistive devices (your cane) as needed to prevent falls Eat a heart healthy and low salt diet; keep watching the carbohydrates and sugar you eat Please call your PCP doctor and request a new blood sugar monitor as we discussed today Continue monitoring and recording your blood sugars at home once you are able to obtain your new blood sugar meter If you believe your condition is getting worse- contact your care providers (doctors) promptly- reaching out to your doctor early when you have concerns can prevent you from having to go to the hospital  If you are experiencing a Mental Health or Behavioral Health Crisis or need someone to talk to, please  call the Suicide and Crisis Lifeline: 988 call the [place]  National Suicide Prevention Lifeline: 443-740-3046 or TTY: 906-364-0019 TTY (818)456-6884) to talk to a trained counselor call 1-800-273-TALK (toll free, 24 hour hotline) go to [facility] [address] [place] [phone]) call the [facility] Crisis Line: [phone] call 911   Patient verbalizes understanding of instructions and care plan provided today and agrees to view in MyChart. Active MyChart status and  patient understanding of how to access instructions and care plan via MyChart confirmed with patient.     Pls call/ message for questions,  Josaphine Shimamoto Mckinney Sumeet Geter, RN, BSN, CCRN Alumnus RN Care Manager  Transitions of Care  [facility] - [facility] [phone]: direct office

## 2024-06-18 ENCOUNTER — Other Ambulatory Visit: Payer: Self-pay | Admitting: *Deleted

## 2024-06-18 DIAGNOSIS — I480 Paroxysmal atrial fibrillation: Secondary | ICD-10-CM | POA: Diagnosis not present

## 2024-06-18 NOTE — Patient Outreach (Signed)
 Transition of Care week 4  Visit Note  06/18/2024  Name: [name] MRN: [id number]          DOB: 1946-02-26  Situation: Patient enrolled in [facility] 30-day program. Visit completed with patient by telephone.   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; left breast DCIS 02/05/22   Carotid artery occlusion    Chronic kidney disease    mild, stage 1   DM (diabetes mellitus) (HCC)    takes Metformin  1000mg  BID   Dyslipidemia    takes Pravastatin  daily   Early cataracts, bilateral    Gout    takes Allopurinol  daily   Herniated disc    left   Lumbar herniated disc    Myocardial infarction (HCC) 2008   pt has 2 stents   Nocturia    Obesity    Personal history of radiation therapy    Unspecified essential hypertension    takes Metoprolol ,Lisinopril ,and Amlodipine    Urinary frequency     Assessment: Patient Reported Symptoms: Cognitive Cognitive Status: No symptoms reported, Alert and oriented to person, place, and time, Able to follow simple commands, Normal speech and language skills      Neurological Neurological Review of Symptoms: No symptoms reported    HEENT HEENT Symptoms Reported: No symptoms reported      Cardiovascular Cardiovascular Symptoms Reported: No symptoms reported Does patient have uncontrolled Hypertension?: No Cardiovascular Comment: pt denies any chest pain  Respiratory Respiratory Symptoms Reported: No symptoms reported    Endocrine Endocrine Symptoms Reported: No symptoms  reported Is patient diabetic?: Yes Is patient checking blood sugars at home?: Yes List most recent blood sugar readings, include date and time of day: pt has not checked this week since moving back to her home, pt now has new glucometer Endocrine Self-Management Outcome: 4 (good) Endocrine Comment: reviewed carbohydate modified diet  Gastrointestinal Gastrointestinal Symptoms Reported: No symptoms reported      Genitourinary Genitourinary Symptoms Reported: No symptoms reported    Integumentary Integumentary Symptoms Reported: No symptoms reported Other Integumentary Symptoms: right groin puncture site is healed, no concerns reported Skin Management Strategies: Routine screening Skin Self-Management Outcome: 4 (good) Skin Comment: Reviewed signs /symptoms of infection  Musculoskeletal Musculoskelatal Symptoms Reviewed: Limited mobility Additional Musculoskeletal Details: uses cane Musculoskeletal Management Strategies: Routine screening, Adequate rest, Medical device Musculoskeletal Self-Management Outcome: 4 (good) Musculoskeletal Comment: reviewed safety precautions      Psychosocial Psychosocial Symptoms Reported: No symptoms reported         There were no vitals filed for this visit.  Medications Reviewed Today     Reviewed by Aura Mliss LABOR, RN (Registered Nurse) on 06/18/24 at 1011  Med List Status: <None>   Medication Order Taking? Sig Documenting Provider Last Dose Status Informant  acetaminophen  (TYLENOL ) 500 MG tablet [id number]  Take 500 mg by mouth 2 (two) times daily as needed for moderate pain or headache.  [provider]  Active Self, Pharmacy Records  allopurinol  (ZYLOPRIM ) 100  MG tablet [id number]  Take 100 mg by mouth at bedtime. [provider]  Active Self, Pharmacy Records  Ascorbic Acid  (VITAMIN C  PO) [id number]  Take 1 tablet by mouth daily.  Patient not taking: Reported on 05/26/2024   [provider]  Active Self, Pharmacy  Records  Ascorbic Acid  (VITAMIN C ) 1000 MG tablet [id number]  Take 1 tablet (1,000 mg total) by mouth daily. Rashid, Farhan, MD  Active   aspirin  81 MG EC tablet [id number]  Take 81 mg by mouth at bedtime. [provider]  Active Self, Pharmacy Records  atorvastatin  (LIPITOR) 40 MG tablet [id number]  Take 40 mg by mouth at bedtime. [provider]  Active Self, Pharmacy Records  chlorthalidone  (HYGROTON ) 25 MG tablet [id number]  Take 12.5 mg by mouth every morning.  Patient taking differently: Take 25 mg by mouth daily. 05/26/24: Reports during TOC call is taking 12.5 mg QD   [provider]  Active Self, Pharmacy Records  Cholecalciferol  (VITAMIN D ) 50 MCG (2000 UT) tablet [id number]  Take 1 tablet (2,000 Units total) by mouth daily. Rashid, Farhan, MD  Active   Cholecalciferol  (VITAMIN D -3 PO) [id number]  Take 1 capsule by mouth daily. [provider]  Active Self, Pharmacy Records  Cyanocobalamin  (VITAMIN B-12 PO) [id number]  Take 1 tablet by mouth daily. [provider]  Active Self, Pharmacy Records  cyanocobalamin  1000 MCG tablet [id number]  Take 0.5 tablets (500 mcg total) by mouth daily. Rashid, Farhan, MD  Active   fluticasone [facility]) 50 MCG/ACT nasal spray [id number]  Place 1 spray into both nostrils as needed for allergies or rhinitis. [provider]  Active Self, Pharmacy Records  hydrALAZINE  (APRESOLINE ) 25 MG tablet [id number]  Take 25 mg by mouth 2 (two) times daily. [provider]  Active Self, Pharmacy Records  LUMIGAN 0.01 % SOLN [id number]  Place 1 drop into both eyes at bedtime. [provider]  Active Self, Pharmacy Records  metFORMIN  (GLUCOPHAGE ) 500 MG tablet [id number]  Take 500 mg by mouth 2 (two) times daily with a meal.  [provider]  Active Self, Pharmacy Records           Med Note (COFFELL, [facility] M   Mon May 18, 2024  1:43 PM) Per dispense report, all Rx's directed to take 1000mg  BID. Patient  states she is only taking 500mg  BID. Unable to confirm if MD changed dose or patient preference.  metoprolol  tartrate (LOPRESSOR ) 50 MG tablet [id number]  Take 1 tablet (50 mg total) by mouth 2 (two) times daily. Alvan Dorn FALCON, MD  Active Self, Pharmacy Records  nitroGLYCERIN  (NITROSTAT ) 0.4 MG SL tablet [id number]  Place 1 tablet (0.4 mg total) under the tongue every 5 (five) minutes x 3 doses as needed for chest pain (If no relief after 3rd dose, proceed to the ED or call 911). Alvan Dorn FALCON, MD  Active Self, Pharmacy Records  potassium chloride  (KLOR-CON ) 10 MEQ tablet [id number]  Take 10 mEq by mouth every Monday, Wednesday, and Friday. [provider]  Active Self, Pharmacy Records  ticagrelor  (BRILINTA ) 90 MG TABS tablet [id number]  Take 1 tablet (90 mg total) by mouth 2 (two) times daily. Dino Antu, MD  Active             Goals Addressed             This Visit's Progress    VBCI Transitions of Care (TOC) Care Plan       Problems:  Recent Hospitalization  for treatment of CAD Recent hospitalization September 20-26, 2025 for [place] / cardiac catherization with PCI Independent: normally lives alone, drives self- temporarily residing with daughter (s) post- recent hospital discharge 1 unplanned hospitalization x (6)/ (12) months 06/18/24- pt reports she is doing well, no new concerns reported, pt has moved back to her home as of 06/15/24, [facility] due to old glucometer not reading correctly, pt has new glucometer now but has not checked CBG this past week, denies chest pain.  Goal:  Over the next 30 days, the patient will not experience hospital readmission  Interventions:  Transitions of Care: week # 4 Discussed current clinical condition: Patient has new glucometer and is independent with checking blood sugar Reviewed signs/ symptoms hypo and hyperglycemia, action plan, importance of starting back checking CBG regularly and keeping a  log Denies clinical concerns and sounds to be in no distress throughout [facility] 30-day program outreach call today Doctor Visits  - discussed the importance of doctor visits Confirmed currently requiring/ using assistive devices- cane only- still no longer need the walker reports more independent/ feels stronger in self-care activities; reports requires minimal assistance- supervision; provided education/ reinforcement around fall prevention Reviewed upcoming provider office visits: 06/22/24: cardiology provider:  confirmed patient is aware of all and has plans to attend as scheduled- although patient should be back at her own home by time of this visit: daughter [name] continues to verbalize plans to attend appointment with patient Reviewed safety precautions  CAD Interventions: Reviewed Importance of taking all medications as prescribed Reviewed Importance of attending all scheduled provider appointments Reviewed/ provided education around basics of following heart healthy low salt diet Encouraged pt to continue checking blood pressure at home Reviewed signs/ symptoms MI, importance of seeking emergency care as needed  Patient Self Care Activities:  Attend all scheduled provider appointments Call provider office for new concerns or questions  Participate in Transition of Care Program/Attend TOC scheduled calls Take medications as prescribed   Use assistive devices (your cane) as needed to prevent falls Eat a heart healthy and low salt diet; keep watching the carbohydrates and sugar you eat Please start checking your blood sugar regularly and keeping a log If you believe your condition is getting worse- contact your care providers (doctors) promptly- reaching out to your doctor early when you have concerns can prevent you from having to go to the hospital  Plan:  Telephone follow up appointment with care management team member scheduled for:  Thursday 06/23/24 at 10:00 am         Recommendation:   PCP Follow-up Specialty provider follow-up cardiology 06/22/24  Follow Up Plan:   Telephone follow-up 06/23/24 @ 10 am with Laine Tousey RN  Mliss Creed [facility], BSN RN Care Manager/ Transition of Care [place]/ [facility] [phone]

## 2024-06-18 NOTE — Patient Instructions (Signed)
 Visit Information  Thank you for taking time to visit with me today. Please don't hesitate to contact me if I can be of assistance to you before our next scheduled telephone appointment.  Our next appointment is by telephone on 06/23/24 @ 10 am  Following is a copy of your care plan:   Goals Addressed             This Visit's Progress    VBCI Transitions of Care (TOC) Care Plan       Problems:  Recent Hospitalization for treatment of CAD Recent hospitalization September 20-26, 2025 for [place] / cardiac catherization with PCI Independent: normally lives alone, drives self- temporarily residing with daughter (s) post- recent hospital discharge 1 unplanned hospitalization x (6)/ (12) months 06/18/24- pt reports she is doing well, no new concerns reported, pt has moved back to her home as of 06/15/24, [facility] due to old glucometer not reading correctly, pt has new glucometer now but has not checked CBG this past week, denies chest pain.  Goal:  Over the next 30 days, the patient will not experience hospital readmission  Interventions:  Transitions of Care: week # 4 Discussed current clinical condition: Patient has new glucometer and is independent with checking blood sugar Reviewed signs/ symptoms hypo and hyperglycemia, action plan, importance of starting back checking CBG regularly and keeping a log Denies clinical concerns and sounds to be in no distress throughout [facility] 30-day program outreach call today Doctor Visits  - discussed the importance of doctor visits Confirmed currently requiring/ using assistive devices- cane only- still no longer need the walker reports more independent/ feels stronger in self-care activities; reports requires minimal assistance- supervision; provided education/ reinforcement around fall prevention Reviewed upcoming provider office visits: 06/22/24: cardiology provider:  confirmed patient is aware of all and has plans to attend  as scheduled- although patient should be back at her own home by time of this visit: daughter [name] continues to verbalize plans to attend appointment with patient Reviewed safety precautions  CAD Interventions: Reviewed Importance of taking all medications as prescribed Reviewed Importance of attending all scheduled provider appointments Reviewed/ provided education around basics of following heart healthy low salt diet Encouraged pt to continue checking blood pressure at home Reviewed signs/ symptoms MI, importance of seeking emergency care as needed  Patient Self Care Activities:  Attend all scheduled provider appointments Call provider office for new concerns or questions  Participate in Transition of Care Program/Attend TOC scheduled calls Take medications as prescribed   Use assistive devices (your cane) as needed to prevent falls Eat a heart healthy and low salt diet; keep watching the carbohydrates and sugar you eat Please start checking your blood sugar regularly and keeping a log If you believe your condition is getting worse- contact your care providers (doctors) promptly- reaching out to your doctor early when you have concerns can prevent you from having to go to the hospital  Plan:  Telephone follow up appointment with care management team member scheduled for:  Thursday 06/23/24 at 10:00 am        Care plan and visit instructions communicated with the patient verbally today. Patient agrees to receive a copy in MyChart. Active MyChart status and patient understanding of how to access instructions and care plan via MyChart confirmed with patient.     Telephone follow up appointment with care management team member scheduled for: 06/23/24 @ 10 am  Please call the care guide team at [phone]  if you need to cancel or reschedule your appointment.   Please call the Suicide and Crisis Lifeline: 988 call the [place]  National Suicide Prevention Lifeline: 470-546-0483 or TTY:  7278489307 TTY 559-066-8678) to talk to a trained counselor call 1-800-273-TALK (toll free, 24 hour hotline) go to [facility] [address] [place] [phone]) call the [facility] Crisis Line: 413-734-9654 call 911 if you are experiencing a Mental Health or Behavioral Health Crisis or need someone to talk to.  Mliss Creed [facility], BSN RN Care Manager/ Transition of Care [place]/ [facility] [phone]

## 2024-06-22 ENCOUNTER — Encounter: Payer: Self-pay | Admitting: Nurse Practitioner

## 2024-06-22 ENCOUNTER — Other Ambulatory Visit (HOSPITAL_COMMUNITY)
Admission: RE | Admit: 2024-06-22 | Discharge: 2024-06-22 | Disposition: A | Discharge status: Home / Self Care | Source: Ambulatory Visit | Attending: Nurse Practitioner | Admitting: Nurse Practitioner

## 2024-06-22 ENCOUNTER — Ambulatory Visit: Attending: Nurse Practitioner | Admitting: Nurse Practitioner

## 2024-06-22 VITALS — BP 138/70 | HR 58 | Ht 62.0 in | Wt 189.2 lb

## 2024-06-22 DIAGNOSIS — E785 Hyperlipidemia, unspecified: Secondary | ICD-10-CM | POA: Diagnosis not present

## 2024-06-22 DIAGNOSIS — I2510 Atherosclerotic heart disease of native coronary artery without angina pectoris: Secondary | ICD-10-CM

## 2024-06-22 DIAGNOSIS — R29818 Other symptoms and signs involving the nervous system: Secondary | ICD-10-CM

## 2024-06-22 DIAGNOSIS — I272 Pulmonary hypertension, unspecified: Secondary | ICD-10-CM

## 2024-06-22 DIAGNOSIS — R0789 Other chest pain: Secondary | ICD-10-CM | POA: Diagnosis not present

## 2024-06-22 DIAGNOSIS — Z79899 Other long term (current) drug therapy: Secondary | ICD-10-CM | POA: Diagnosis not present

## 2024-06-22 DIAGNOSIS — I10 Essential (primary) hypertension: Secondary | ICD-10-CM

## 2024-06-22 DIAGNOSIS — I4729 Other ventricular tachycardia: Secondary | ICD-10-CM | POA: Diagnosis not present

## 2024-06-22 DIAGNOSIS — R0609 Other forms of dyspnea: Secondary | ICD-10-CM | POA: Diagnosis not present

## 2024-06-22 DIAGNOSIS — I471 Supraventricular tachycardia, unspecified: Secondary | ICD-10-CM | POA: Diagnosis not present

## 2024-06-22 DIAGNOSIS — I739 Peripheral vascular disease, unspecified: Secondary | ICD-10-CM

## 2024-06-22 DIAGNOSIS — I4891 Unspecified atrial fibrillation: Secondary | ICD-10-CM | POA: Diagnosis not present

## 2024-06-22 LAB — CBC
HCT: 34.2 % — ABNORMAL LOW (ref 36.0–46.0)
Hemoglobin: 10.7 g/dL — ABNORMAL LOW (ref 12.0–15.0)
MCH: 28.7 pg (ref 26.0–34.0)
MCHC: 31.3 g/dL (ref 30.0–36.0)
MCV: 91.7 fL (ref 80.0–100.0)
Platelets: 248 K/uL (ref 150–400)
RBC: 3.73 MIL/uL — ABNORMAL LOW (ref 3.87–5.11)
RDW: 16.8 % — ABNORMAL HIGH (ref 11.5–15.5)
WBC: 7 K/uL (ref 4.0–10.5)
nRBC: 0 % (ref 0.0–0.2)

## 2024-06-22 LAB — BASIC METABOLIC PANEL WITH GFR
Anion gap: 14 (ref 5–15)
BUN: 44 mg/dL — ABNORMAL HIGH (ref 8–23)
CO2: 20 mmol/L — ABNORMAL LOW (ref 22–32)
Calcium: 9.9 mg/dL (ref 8.9–10.3)
Chloride: 106 mmol/L (ref 98–111)
Creatinine, Ser: 1.82 mg/dL — ABNORMAL HIGH (ref 0.44–1.00)
GFR, Estimated: 28 mL/min — ABNORMAL LOW (ref >60)
Glucose, Bld: 116 mg/dL — ABNORMAL HIGH (ref 70–99)
Potassium: 4.8 mmol/L (ref 3.5–5.1)
Sodium: 140 mmol/L (ref 135–145)

## 2024-06-22 LAB — D-DIMER, QUANTITATIVE: D-Dimer, Quant: 0.81 ug{FEU}/mL — ABNORMAL HIGH (ref 0.00–0.50)

## 2024-06-22 MED ORDER — TICAGRELOR 90 MG PO TABS: 90.0000 mg | ORAL_TABLET | Freq: Two times a day (BID) | ORAL | 3 refills | Status: AC

## 2024-06-22 NOTE — Patient Instructions (Addendum)
 Medication Instructions:   Brilinta  refilled today Continue all other medications.     Labwork:  CBC, BMET, D-dimer - orders given Please do in 1 week  Testing/Procedures:  Your physician has recommended that you have a pulmonary function test. Pulmonary Function Tests are a group of tests that measure how well air moves in and out of your lungs.   Follow-Up:  Office will contact with results via phone, letter or mychart.    6 weeks   Any Other Special Instructions Will Be Listed Below (If Applicable).  You have been referred to:  Pulmonary   If you need a refill on your cardiac medications before your next appointment, please call your pharmacy.

## 2024-06-22 NOTE — Progress Notes (Unsigned)
 Cardiology Office Note   Date:  06/22/2024 ID:  Tasheba, Henson 1946-05-14, MRN [id number] PCP: Lari Elspeth BRAVO, MD  [facility] Providers Cardiologist:  Alvan Carrier, MD     History of Present Illness LUANNA WEESNER is a 78 y.o. female with a PMH of CAD, s/p CABG, hypertension, type 2 diabetes, hyperlipidemia, peripheral vascular disease, CKD, left breast cancer, who presents today for hospital follow-up.  Hospitalized in September 2025 due to unstable angina.  Underwent cardiac catheterization and received successful PCI of ostial/proximal SVG-RCA with DES x 1.  Recommend to continue DAPT x 1 year.  It was noted she did have episodes of A-fib in the hospital, ZIO monitor was recommended by cardiologist for 2 weeks and metoprolol  dosage was increased to 50 mg twice daily at discharge.  Here for follow-up. She states she is doing well. Does notice some pain running down her left arm, PCP wonders if this is neuropathic related. Pain shoots from left upper chest down her left arm. Denies any palpitations, syncope, presyncope, dizziness, orthopnea, PND, swelling or significant weight changes, acute bleeding, or claudication.  Does admit to some dyspnea on exertion.   ROS: Negative. See HPI.   Studies Reviewed  EKG: EKG is not ordered today.   Cardiac monitor (preliminary report) 05/2024:  Patch Wear Time:  13 days and 10 hours (2025-10-02T22:02:11-398 to 2025-10-16T08:31:57-0400)   Patient had a min HR of 46 bpm, max HR of 188 bpm, and avg HR of 57 bpm. Predominant underlying rhythm was Sinus Rhythm. 1 run of Ventricular Tachycardia occurred lasting 14 beats with a max rate of 188 bpm (avg 169 bpm). 3 Supraventricular Tachycardia  runs occurred, the run with the fastest interval lasting 4 beats with a max rate of 132 bpm, the longest lasting 15 beats with an avg rate of 97 bpm. Isolated SVEs were rare (<1.0%), SVE Couplets were rare (<1.0%), and SVE Triplets were rare  (<1.0%).  Isolated VEs were rare (<1.0%, 1608), VE Couplets were rare (<1.0%, 3), and VE Triplets were rare (<1.0%, 1). Ventricular Bigeminy was present.   Coronary stent intervention 04/2024:    Origin to Prox Graft lesion before Acute Mrg  is 80% stenosed.   A drug-eluting stent was successfully placed using a STENT ONYX FRONTIER 2.25X22.   Post intervention, there is a 0% residual stenosis.   Recommend uninterrupted dual antiplatelet therapy with Aspirin  81mg  daily and Ticagrelor  90mg  twice daily for a minimum of 12 months (ACS-Class I recommendation).   Successful PCI of the ostial/proximal SVG to RCA with DES x 1   Plan: DAPT for one year.   LHC 04/2024:  Cardiac Catheterization 05/19/24: Hemodynamic data: LV 171/1, EDP 17 mmHg.  Ao 179/86, mean 108 mmHg.  There is no pressure gradient across the aortic valve.   Angiographic data: LM: Calcified but widely patent. LAD: Has a long proximal LAD stent that is occluded.  Distal LAD is supplied by LIMA. LCx: Very large vessel, has a long calcific CTO in the proximal and mid segment with bridging collaterals and also faint collaterals from marginals to the distal Cx.  There are 2 large marginals which are grafted. RCA: Difficult to engage, tortuous and unfolding of the aorta.  The RCA in the proximal segment has tandem 80 to 90% stenosis followed by mild disease distally, has early bifurcation into large PDA and PL branches which are supplied by vein graft. LIMA to LAD: Widely patent. SVG to OM 1: Widely patent.  OM1 also  has a inferior branch which is fairly large. SVG to RCA: Jump graft of SVG to acute marginal and PDA has proximal/ostial aborted 20 to 23 mm 80% stenosis, distal graft is healthy.      Impression and recommendations: In view of recent acute renal failure, chronic renal insufficiency, I utilized 50 cc of contrast to perform diagnostic angiography, would recommend staged intervention to SVG to RCA using AR-1 guide catheter.   Timing will depend upon her renal function, discussed with Dr. Mona.  Echo 04/2024:  1. No left ventricular thrombus is seen (Definity  contrast was used).  There is a small apical anterior and apical septal area of severe  hypokinesis. Left ventricular ejection fraction, by estimation, is 55 to  60%. The left ventricle has normal function.   The left ventricle demonstrates regional wall motion abnormalities (see  scoring diagram/findings for description). There is mild concentric left  ventricular hypertrophy. Left ventricular diastolic parameters are  consistent with Grade II diastolic  dysfunction (pseudonormalization). Elevated left atrial pressure.   2. Right ventricular systolic function is mildly reduced. The right  ventricular size is mildly enlarged. There is severely elevated pulmonary  artery systolic pressure. The estimated right ventricular systolic  pressure is 64.2 mmHg.   3. Left atrial size was moderately dilated.   4. Right atrial size was moderately dilated.   5. The mitral valve is normal in structure. Mild mitral valve  regurgitation. No evidence of mitral stenosis.   6. The tricuspid valve is abnormal. Tricuspid valve regurgitation is  moderate to severe.   7. The aortic valve is tricuspid. Aortic valve regurgitation is not  visualized. No aortic stenosis is present.   Comparison(s): Prior images unable to be directly viewed, comparison made  by report only. There is a new apical area of hypokinesis, consistent with  infarction in the distal LAD artery distribution. There is new evidence of  increased mean left atrial  pressure.   ABI's 02/2024:  Summary:  Right: Resting right ankle-brachial index indicates severe right lower  extremity arterial disease.   Left: Resting left ankle-brachial index indicates severe left lower  extremity arterial disease.  Carotid doppler 02/2024:  Summary:  Right Carotid: Velocities in the right ICA are consistent with a  60-79%                 stenosis. Acoustic shadowing may obscure higher velocities.   Left Carotid: Velocities in the left ICA are consistent with a 1-39%  stenosis.   Vertebrals: Bilateral vertebral arteries demonstrate antegrade flow.  Subclavians: Left subclavian artery was stenotic. Normal flow hemodynamics  were              seen in the right subclavian artery.   *See table(s) above for measurements and observations.     Physical Exam VS:  BP 138/70   Pulse (!) 58   Ht 5' 2 (1.575 m)   Wt 189 lb 3.2 oz (85.8 kg)   SpO2 99%   BMI 34.61 kg/m        Wt Readings from Last 3 Encounters:  06/22/24 189 lb 3.2 oz (85.8 kg)  05/16/24 196 lb 10.4 oz (89.2 kg)  03/17/24 207 lb (93.9 kg)    GEN: Well nourished, well developed in no acute distress NECK: No JVD; No carotid bruits CARDIAC: S1/S2, RRR, no murmurs, rubs, gallops RESPIRATORY:  Clear to auscultation without rales, wheezing or rhonchi  ABDOMEN: Soft, non-tender, non-distended EXTREMITIES:  No edema; No deformity   ASSESSMENT AND  PLAN  CAD, s/p CABG, atypical chest pain, medication management Doing better since leaving the hospital, does admit to some atypical pain that does sound neuropathic related-see HPI.  Underwent cardiac catheterization and received successful PCI of ostial/proximal SVG-RCA with DES x 1.  Recommend to continue DAPT x 1 year. Continue aspirin , atorvastatin , Lopressor , Brilinta , and nitroglycerin  as needed.  No complications post cath. Heart healthy diet and regular cardiovascular exercise encouraged.  Okay to begin cardiac rehab. Continue to follow with PCP.  Care and ED precautions discussed.  Will refill Brilinta  per her request.  Will obtain CBC and BMET.    Cardiac Rehabilitation Eligibility Assessment  The patient is ready to start cardiac rehabilitation from a cardiac standpoint.    2.  A-fib, NSVT, PSVT Limited episode while in the hospital, and reviewed preliminary monitor report that did  not show episodes of A-fib.  Denies any tachycardia or palpitations.  Continue Lopressor  and no room to uptitrate based on current heart rate at this time.  Care and ED precautions discussed  3. HTN Blood pressure stable and at goal. Discussed to monitor BP at home at least 2 hours after medications and sitting for 5-10 minutes.  No medication changes at this time. Heart healthy diet and regular cardiovascular exercise encouraged.   4. HLD LDL from last month was 32.  She is at goal.  Continue atorvastatin . Heart healthy diet and regular cardiovascular exercise encouraged.   5. PVD History of lower extremity arterial disease as well as carotid stenosis.  See most recent vascular studies noted above.  Denies any symptoms.  She has been closely followed by vascular surgery.  6.  Dyspnea on exertion, pulmonary hypertension, suspected sleep apnea Does admit to some dyspnea on exertion.  Most recent echo showed 5. CKD normal LVEF, grade 2 DD, with severely elevated PASP, estimated right ventricular systolic pressure 64.2 mmHg.  Etiology unclear.  Daughter does admit that patient experiences snoring.  Will arrange D-dimer, PFTs, and will refer to pulmonology for OSA evaluation.  Care and ED precautions discussed.   Dispo: Follow-up with MD/APP in 6 weeks or sooner if any changes.  Signed, Almarie Crate, NP

## 2024-06-23 ENCOUNTER — Other Ambulatory Visit: Payer: Self-pay | Admitting: *Deleted

## 2024-06-23 ENCOUNTER — Ambulatory Visit: Payer: Self-pay | Admitting: Nurse Practitioner

## 2024-06-23 DIAGNOSIS — I200 Unstable angina: Secondary | ICD-10-CM

## 2024-06-23 DIAGNOSIS — I259 Chronic ischemic heart disease, unspecified: Secondary | ICD-10-CM

## 2024-06-23 NOTE — Patient Instructions (Signed)
 Visit Information  Thank you for taking time to visit with me today. Please don't hesitate to contact me if I can be of assistance to you before our next scheduled telephone appointment.  It has been a pleasure working with you over the last 30 days!  Great job managing your care after your hospital visit!  I am glad that you are doing well!  Please listen for a call from the scheduling care guide to schedule a phone call with the new nurse care manager who will pick up in your care where we are leaving off today  Following are the goals we discussed today:  Patient Self Care Activities:  Attend all scheduled provider appointments Call provider office for new concerns or questions  Take medications as prescribed   Use assistive devices (your cane) as needed to prevent falls Eat a heart healthy and low salt diet; keep watching the carbohydrates and sugar you eat Please start checking your blood sugar regularly and keeping a log If you believe your condition is getting worse- contact your care providers (doctors) promptly- reaching out to your doctor early when you have concerns can prevent you from having to go to the hospital  If you are experiencing a Mental Health or Behavioral Health Crisis or need someone to talk to, please  call the Suicide and Crisis Lifeline: 988 call the [place]  National Suicide Prevention Lifeline: 7131980454 or TTY: 708 880 2481 TTY 281-048-3127) to talk to a trained counselor call 1-800-273-TALK (toll free, 24 hour hotline) go to [facility] [address] [place] [phone]) call the [facility] Crisis Line: [phone] call 911   Care plan and visit instructions communicated with the patient verbally today. Patient agrees to receive a copy in MyChart. Active MyChart status and patient understanding of how to access instructions and care plan via MyChart confirmed with patient.     Terrionna Bridwell Mckinney Jafet Wissing, RN,  BSN, Media Planner  Transitions of Care  VBCI - [facility] [phone]: direct office

## 2024-06-23 NOTE — Transitions of Care (Post Inpatient/ED Visit) (Signed)
 Transition of Care week # 5/ day # 29 TOC 30-day program case closure Visit Note  06/23/2024  Name: [name] MRN: [id number]          DOB: 1945/10/26  Situation: Patient enrolled in [facility] 30-day program. Visit completed with patient by telephone.   HIPAA identifiers x 2 verified  TOC 30--day outreach completed; patient has successfully met/ accomplished established goals for TOC 30-day program without hospital readmission and referral was sent for ongoing follow up with longitudinal RN CM   Background:  Recent hospitalization September 20-26, 2025 for [place] / cardiac catherization with PCI Independent: normally lives alone, drives self- temporarily residing with daughter (s) post- recent hospital discharge 1 unplanned hospitalization x (6)/ (12) months  Initial Transition Care Management Follow-up Telephone Call Discharge Date and Diagnosis: 05/22/24, [place] - cardiac catherization with PCI   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; left breast DCIS 02/05/22   Carotid artery occlusion    Chronic kidney disease    mild, stage 1   DM (diabetes mellitus) (HCC)    takes Metformin  1000mg  BID   Dyslipidemia    takes Pravastatin  daily   Early cataracts, bilateral    Gout    takes Allopurinol  daily   Herniated disc    left   Lumbar herniated disc    Myocardial infarction (HCC) 2008   pt has 2 stents   Nocturia    Obesity    Personal history of radiation therapy    Unspecified essential hypertension    takes Metoprolol ,Lisinopril ,and Amlodipine    Urinary frequency    Assessment:  I am doing fine, the  heart doctor was very happy with my progress yesterday and even cleared me to start driving short distances again.  My blood sugars have been running fine using the new meter- I only check them about once a week because my A1-C has been fine.  Since I was in the hospital- I have lost 11 or 12 pounds-- I am trying to eat healthier.  No falls, still using my cane.  Doing fine back at my own home and my family checks on me frequently.    Denies clinical concerns and sounds to be in no distress throughout [facility] 30-day program outreach call today  Patient Reported Symptoms: Cognitive Cognitive Status: Normal speech and language skills, Alert and oriented to person, place, and time, Insightful and able to interpret abstract concepts Cognitive/Intellectual Conditions Management [RPT]: None reported or documented in medical history or problem list      Neurological Neurological Review of Symptoms: No symptoms reported    HEENT HEENT Symptoms Reported: No symptoms reported      Cardiovascular Cardiovascular Symptoms Reported: No symptoms reported, Other: Other Cardiovascular Symptoms: Confirmed attended cardiology provider office visit 06/22/24- reviewed with patient; confirmed no medication changes Does patient have uncontrolled Hypertension?: No Cardiovascular Management Strategies: Routine screening, Coping strategies, Medication therapy, Adequate rest  Respiratory Respiratory Symptoms Reported: No symptoms reported Other Respiratory Symptoms: Denies shortness of breath/ cough; sounds to be in no respiratory distress throughout [facility] call; reminded  patient to get fu vaccine for 2025-2026 winter season: verbalizes plans to obtain flu vaccine soon Respiratory Management Strategies: Coping strategies, Adequate rest, Routine screening  Endocrine Endocrine Symptoms Reported: No symptoms reported Is patient diabetic?: Yes Is patient checking blood sugars at home?: Yes List most recent blood sugar  readings, include date and time of day: Reports obtained new glucometer and is monitoring weekly blood sugars: denies significant high/ low values at home; reports last monitored blood sugar was fine- can't remember the exact number; encouraged patient to monitor and record at least weekly or twice weekly; reviewed last recorded A1-C (05/17/24: 6.4)    Gastrointestinal Gastrointestinal Symptoms Reported: No symptoms reported Additional Gastrointestinal Details: Continues to report good appetite, eating good; having normal and regular BM's Gastrointestinal Management Strategies: Coping strategies    Genitourinary Genitourinary Symptoms Reported: No symptoms reported    Integumentary Integumentary Symptoms Reported: No symptoms reported    Musculoskeletal Musculoskelatal Symptoms Reviewed: Limited mobility Additional Musculoskeletal Details: confirmed uses assistive devices on regular basis, at baseline -- cane; denies new/ recent falls Musculoskeletal Management Strategies: Routine screening, Coping strategies, Adequate rest, Medical device   Fall risk Follow up: Falls prevention discussed  Psychosocial Psychosocial Symptoms Reported: No symptoms reported Behavioral Management Strategies: Coping strategies, Support system Major Change/Loss/Stressor/Fears (CP): Denies Techniques to Cope with Loss/Stress/Change: Diversional activities Quality of Family Relationships: supportive, involved, helpful   There were no vitals filed for this visit.  Medications Reviewed Today     Reviewed by Esti Demello M, RN (Registered Nurse) on 06/23/24 at 1001  Med List Status: <None>   Medication Order Taking? Sig Documenting Provider Last Dose Status Informant  acetaminophen  (TYLENOL ) 500 MG tablet [id number]  Take 500 mg by mouth 2 (two) times daily as needed for moderate pain or headache.  [provider]  Active Self, Pharmacy Records  allopurinol  (ZYLOPRIM ) 100 MG tablet [id number]  Take 100 mg  by mouth at bedtime. [provider]  Active Self, Pharmacy Records  Ascorbic Acid  (VITAMIN C ) 1000 MG tablet [id number]  Take 1 tablet (1,000 mg total) by mouth daily. Dino Antu, MD  Active   aspirin  81 MG EC tablet [id number]  Take 81 mg by mouth at bedtime. [provider]  Active Self, Pharmacy Records  atorvastatin  (LIPITOR) 40 MG tablet [id number]  Take 40 mg by mouth at bedtime. [provider]  Active Self, Pharmacy Records  chlorthalidone  (HYGROTON ) 25 MG tablet [id number]  Take 12.5 mg by mouth every morning.  Patient taking differently: Take 25 mg by mouth daily. 05/26/24: Reports during TOC call is taking 12.5 mg QD   [provider]  Active Self, Pharmacy Records  Cholecalciferol  (VITAMIN D ) 50 MCG (2000 UT) tablet [id number]  Take 1 tablet (2,000 Units total) by mouth daily. Dino Antu, MD  Active   Cyanocobalamin  (VITAMIN B-12 PO) [id number]  Take 1 tablet by mouth daily. [provider]  Active Self, Pharmacy Records  fluticasone [facility]) 50 MCG/ACT nasal spray [id number]  Place 1 spray into both nostrils as needed for allergies or rhinitis. [provider]  Active Self, Pharmacy Records  hydrALAZINE  (APRESOLINE ) 25 MG tablet [id number]  Take 25 mg by mouth 2 (two) times daily. [provider]  Active Self, Pharmacy Records  LUMIGAN 0.01 % SOLN [id number]  Place 1 drop into both eyes at bedtime. [provider]  Active Self, Pharmacy Records  metFORMIN  (GLUCOPHAGE ) 500 MG tablet [id number]  Take 500 mg by mouth 2 (two) times daily with a meal.  [provider]  Active Self, Pharmacy Records           Med Note (COFFELL, JON HERO   Mon May 18, 2024  1:43 PM) Per dispense report, all Rx's directed to take 1000mg  BID. Patient states she is only taking 500mg  BID. Unable to confirm if MD changed dose or patient preference.  metoprolol  tartrate (LOPRESSOR ) 50 MG tablet [id number]  Take 1 tablet (50 mg total) by  mouth 2 (two) times daily. Alvan Dorn FALCON, MD  Active Self, Pharmacy Records  nitroGLYCERIN  (NITROSTAT ) 0.4 MG SL tablet [id number]  Place 1 tablet (0.4 mg total) under the tongue every 5 (five) minutes x 3 doses as needed for chest pain (If no relief after 3rd dose, proceed to the ED or call 911). Alvan Dorn FALCON, MD  Active Self, Pharmacy Records  potassium chloride  (KLOR-CON ) 10 MEQ tablet [id number]  Take 10 mEq by mouth every Monday, Wednesday, and Friday. [provider]  Active Self, Pharmacy Records  ticagrelor  (BRILINTA ) 90 MG TABS tablet [id number]  Take 1 tablet (90 mg total) by mouth 2 (two) times daily. Miriam Norris, NP  Active            Recommendation:   Continue Current Plan of Care  Follow Up Plan:   Referral to RN Case Manager Closing From:  Transitions of Care Program  Pls call/ message for questions,  Zanya Lindo Mckinney Corrisa Gibby, RN, BSN, CCRN Alumnus RN Care Manager  Transitions of Care  VBCI - [facility] [phone]: direct office

## 2024-07-01 ENCOUNTER — Telehealth: Payer: Self-pay

## 2024-07-01 NOTE — Progress Notes (Unsigned)
 Complex Care Management Note Care Guide Note  07/01/2024 Name: [name] MRN: [id number] DOB: [birth year]   Complex Care Management Outreach Attempts: An unsuccessful telephone outreach was attempted today to offer the patient information about available complex care management services.  Follow Up Plan:  Additional outreach attempts will be made to offer the patient complex care management information and services.   Encounter Outcome:  No Answer-No Voicemail, Unable to leave a message.  Leotis Rase [facility], [facility] Guide  Direct Dial: [phone]  Fax [phone]

## 2024-07-02 NOTE — Progress Notes (Unsigned)
 Complex Care Management Note Care Guide Note  07/02/2024 Name: [name] MRN: [id number] DOB: [birth year]   Complex Care Management Outreach Attempts: A second unsuccessful outreach was attempted today to offer the patient with information about available complex care management services.  Follow Up Plan:  Additional outreach attempts will be made to offer the patient complex care management information and services.   Encounter Outcome:  No Answer-No Voicemail set up  Leotis Rase [facility], [facility] Guide  Direct Dial: [phone]  Fax [phone]

## 2024-07-28 ENCOUNTER — Encounter: Payer: Self-pay | Admitting: Cardiology

## 2024-07-28 ENCOUNTER — Ambulatory Visit: Attending: Cardiology | Admitting: Cardiology

## 2024-07-28 VITALS — BP 138/80 | HR 57 | Ht 62.0 in | Wt 196.0 lb

## 2024-07-28 DIAGNOSIS — I480 Paroxysmal atrial fibrillation: Secondary | ICD-10-CM

## 2024-07-28 NOTE — Progress Notes (Unsigned)
 Clinical Summary [name] is a 78 y.o.female seen today for follow up of the following medical problems.      1. CAD   - CABG Jan 2013: 3 vessel (LIMA to LAD, SVG to OM, SVG to acute marginal).She had prior stenting as described below   - Jan 2013 echo LVEF 55-60%       -04/2024 admission with unstable angina - 04/2024 cath: patent LM, occluded LAD stent with distal LAD supplied by LIMA, LCX CTO with collaterals, RCA tandem 80 to 90% stenosis. LIMA-LAD patent, SVG-OM1 patent, SVG-RCA 80% - had DES to SVG-RCA 04/2024 echo: LVEF 55-60%, grade II dd, PASP 64, mod BAE  -no chest pains, no SOB/DOE - compliant with meds  2.Afib - noted during 04/2024 admission - outpatient monitor no recurrence - was not committed to anticoag during admission, brief episodes.  - no recent palpitations     2. Hyperlipidemia   -upcoming labs with pcp - she is on atorvastain   Jan 2023 TC 127 TG 146 HDL 33 LDL 68 - she is on atorvastatin  40mg  daily.  - upcoming labs with pcp in Jan  04/2024 TC 74 TG 95 HDL 23 LDL 32   3. HTN   - home bp's typicaly 130s/70s       4. PAD - followed by vascular. - carotid stenosis followed by vascular, LE arterial disease as well   5. CKD  - followed by Dr Rachele   6.Breast cancer -02/2022 lumpectomy  7. 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; left breast DCIS 02/05/22   Carotid artery occlusion    Chronic kidney disease    mild, stage 1   DM (diabetes mellitus) (HCC)    takes Metformin  1000mg  BID   Dyslipidemia    takes Pravastatin  daily    Early cataracts, bilateral    Gout    takes Allopurinol  daily   Herniated disc    left   Lumbar herniated disc    Myocardial infarction [facility]) 2008   pt has 2 stents   Nocturia    Obesity    Personal history of radiation therapy    Unspecified essential hypertension    takes Metoprolol ,Lisinopril ,and Amlodipine    Urinary frequency      Not on File   Current Outpatient Medications  Medication Sig Dispense Refill   acetaminophen  (TYLENOL ) 500 MG tablet Take 500 mg by mouth 2 (two) times daily as needed for moderate pain or headache.      allopurinol  (ZYLOPRIM ) 100 MG tablet Take 100 mg by mouth at bedtime.     Ascorbic Acid  (VITAMIN C ) 1000 MG tablet Take 1 tablet (1,000 mg total) by mouth daily. 30 tablet 0   aspirin  81 MG EC tablet Take 81 mg by mouth at bedtime.     atorvastatin  (LIPITOR) 40 MG tablet Take 40 mg by mouth at bedtime.     chlorthalidone  (HYGROTON ) 25 MG tablet Take 12.5 mg by mouth every morning. (Patient taking differently: Take 25 mg  by mouth daily. 05/26/24: Reports during TOC call is taking 12.5 mg QD)     Cholecalciferol  (VITAMIN D ) 50 MCG (2000 UT) tablet Take 1 tablet (2,000 Units total) by mouth daily. 30 tablet 0   Cyanocobalamin  (VITAMIN B-12 PO) Take 1 tablet by mouth daily.     fluticasone (FLONASE) 50 MCG/ACT nasal spray Place 1 spray into both nostrils as needed for allergies or rhinitis.     hydrALAZINE  (APRESOLINE ) 25 MG tablet Take 25 mg by mouth 2 (two) times daily.     LUMIGAN 0.01 % SOLN Place 1 drop into both eyes at bedtime.     metFORMIN  (GLUCOPHAGE ) 500 MG tablet Take 500 mg by mouth 2 (two) times daily with a meal.      metoprolol  tartrate (LOPRESSOR ) 50 MG tablet Take 1 tablet (50 mg total) by mouth 2 (two) times daily. 180 tablet 3   nitroGLYCERIN  (NITROSTAT ) 0.4 MG SL tablet Place 1 tablet (0.4 mg total) under the tongue every 5 (five) minutes x 3 doses as needed for chest pain (If no relief after 3rd dose, proceed to the ED or call  911). 25 tablet 3   potassium chloride  (KLOR-CON ) 10 MEQ tablet Take 10 mEq by mouth every Monday, Wednesday, and Friday.     ticagrelor  (BRILINTA ) 90 MG TABS tablet Take 1 tablet (90 mg total) by mouth 2 (two) times daily. 180 tablet 3   No current facility-administered medications for this visit.     Past Surgical History:  Procedure Laterality Date   ABDOMINAL HYSTERECTOMY     BACK SURGERY  2009/2010/2011   BREAST BIOPSY  unknown   left   BREAST BIOPSY Left 02/05/2022   Times 2   BREAST LUMPECTOMY WITH RADIOACTIVE SEED LOCALIZATION Left 03/03/2019   Procedure: LEFT BREAST LUMPECTOMY WITH RADIOACTIVE SEED LOCALIZATION;  Surgeon: Ebbie Cough, MD;  Location: [facility];  Service: General;  Laterality: Left;   BREAST LUMPECTOMY WITH RADIOACTIVE SEED LOCALIZATION Left 03/14/2022   Procedure: LEFT BREAST SEED GUIDED LUMPECTOMY;  Surgeon: Ebbie Cough, MD;  Location: [facility] OR;  Service: General;  Laterality: Left;   CARDIAC CATHETERIZATION  08/27/2006   NON-STEMI/TAXUS STENTING 100% PROXIMAL LAD (X2) JANUARY 2008   CARDIAC CATHETERIZATION  08/27/2010   occluded LAD stents, Om1 : 70%, RCA: 80%, Ef 40-45%   CARPAL TUNNEL RELEASE     bilateral   CATARACT EXTRACTION W/PHACO Right 10/11/2017   Procedure: CATARACT EXTRACTION PHACO AND INTRAOCULAR LENS PLACEMENT RIGHT EYE;  Surgeon: Harrie Agent, MD;  Location: [facility];  Service: Ophthalmology;  Laterality: Right;  CDE: 5.17   CATARACT EXTRACTION W/PHACO Left 12/27/2017   Procedure: CATARACT EXTRACTION PHACO AND INTRAOCULAR LENS PLACEMENT (IOC);  Surgeon: Harrie Agent, MD;  Location: [facility];  Service: Ophthalmology;  Laterality: Left;  CDE: 2.99   CHOLECYSTECTOMY  not sure   COLONOSCOPY     CORONARY ARTERY BYPASS GRAFT  09/05/2011   Procedure: CORONARY ARTERY BYPASS GRAFTING (CABG);  Surgeon: Dallas KATHEE Jude, MD;  Location: [facility] OR;  Service: Open Heart Surgery;  Laterality: N/A;  CABG times three using left internal  mammary artery and left leg greater saphenous vein harvested endoscopically   CORONARY STENT INTERVENTION N/A 05/21/2024   Procedure: CORONARY STENT INTERVENTION;  Surgeon: Jordan, Peter M, MD;  Location: [facility] INVASIVE CV LAB;  Service: Cardiovascular;  Laterality: N/A;   LEFT HEART CATH AND CORS/GRAFTS ANGIOGRAPHY N/A 05/19/2024   Procedure: LEFT HEART CATH AND CORS/GRAFTS ANGIOGRAPHY;  Surgeon: Ladona Heinz, MD;  Location: [facility]  INVASIVE CV LAB;  Service: Cardiovascular;  Laterality: N/A;   LEG TENDON SURGERY  unknown   left    LUMBAR LAMINECTOMY/DECOMPRESSION MICRODISCECTOMY Left 06/04/2014   Procedure: LUMBAR LAMINECTOMY/DECOMPRESSION MICRODISCECTOMY LUMBAR FOUR-FIVE;  Surgeon: Arley SHAUNNA Helling, MD;  Location: [facility];  Service: Neurosurgery;  Laterality: Left;   MASS EXCISION Left 08/08/2018   Procedure: EXCISION 5CM LIPOMA ON BACK;  Surgeon: Kallie Manuelita BROCKS, MD;  Location: [facility];  Service: General;  Laterality: Left;   TONSILLECTOMY       Not on File    Family History  Problem Relation Age of Onset   Hypertension Father    Heart attack Sister 42   Heart attack Brother 11   Dementia Mother    Lupus Brother    Heart attack Brother    Hypertension Brother    Anesthesia problems Neg Hx    Hypotension Neg Hx    Malignant hyperthermia Neg Hx    Pseudochol deficiency Neg Hx      Social History [name] reports that she has never smoked. She has never used smokeless tobacco. [name] reports no history of alcohol  use.   Review of Systems CONSTITUTIONAL: No weight loss, fever, chills, weakness or fatigue.  HEENT: Eyes: No visual loss, blurred vision, double vision or yellow sclerae.No hearing loss, sneezing, congestion, runny nose or sore throat.  SKIN: No rash or itching.  CARDIOVASCULAR:  RESPIRATORY: No shortness of breath, cough or sputum.  GASTROINTESTINAL: No anorexia, nausea, vomiting or diarrhea. No abdominal pain or blood.  GENITOURINARY: No burning on urination,  no polyuria NEUROLOGICAL: No headache, dizziness, syncope, paralysis, ataxia, numbness or tingling in the extremities. No change in bowel or bladder control.  MUSCULOSKELETAL: No muscle, back pain, joint pain or stiffness.  LYMPHATICS: No enlarged nodes. No history of splenectomy.  PSYCHIATRIC: No history of depression or anxiety.  ENDOCRINOLOGIC: No reports of sweating, cold or heat intolerance. No polyuria or polydipsia.  [name]   Physical Examination There were no vitals filed for this visit. There were no vitals filed for this visit.  Gen: resting comfortably, no acute distress HEENT: no scleral icterus, pupils equal round and reactive, no palptable cervical adenopathy,  CV Resp: Clear to auscultation bilaterally GI: abdomen is soft, non-tender, non-distended, normal bowel sounds, no hepatosplenomegaly MSK: extremities are warm, no edema.  Skin: warm, no rash Neuro:  no focal deficits Psych: appropriate affect   Diagnostic Studies     Assessment and Plan  1. CAD   - denies any significant symptoms, continue current meds   2. HTN   - bp at goal, continue current meds   3. Hyperlipidemia   - request pcp labs, continue current meds   EKG today shows SR   Dorn PHEBE Ross, M.D., F.A.C.C.

## 2024-07-28 NOTE — Patient Instructions (Signed)
Medication Instructions:  Continue all current medications.  Labwork: none  Testing/Procedures: none  Follow-Up: 4 months   Any Other Special Instructions Will Be Listed Below (If Applicable).  If you need a refill on your cardiac medications before your next appointment, please call your pharmacy.\ 

## 2024-07-31 ENCOUNTER — Ambulatory Visit: Admitting: Cardiology

## 2024-08-12 ENCOUNTER — Ambulatory Visit: Payer: Self-pay | Admitting: Cardiology

## 2024-09-04 ENCOUNTER — Encounter: Payer: Self-pay | Admitting: Nurse Practitioner

## 2024-09-04 ENCOUNTER — Ambulatory Visit: Admitting: Nurse Practitioner

## 2024-09-04 VITALS — BP 140/60 | HR 65 | Ht 62.0 in | Wt 192.0 lb

## 2024-09-04 DIAGNOSIS — E66812 Obesity, class 2: Secondary | ICD-10-CM

## 2024-09-04 DIAGNOSIS — R29818 Other symptoms and signs involving the nervous system: Secondary | ICD-10-CM | POA: Insufficient documentation

## 2024-09-04 DIAGNOSIS — I480 Paroxysmal atrial fibrillation: Secondary | ICD-10-CM | POA: Insufficient documentation

## 2024-09-04 DIAGNOSIS — G478 Other sleep disorders: Secondary | ICD-10-CM

## 2024-09-04 DIAGNOSIS — I272 Pulmonary hypertension, unspecified: Secondary | ICD-10-CM | POA: Insufficient documentation

## 2024-09-04 DIAGNOSIS — Z6835 Body mass index (BMI) 35.0-35.9, adult: Secondary | ICD-10-CM

## 2024-09-04 DIAGNOSIS — R351 Nocturia: Secondary | ICD-10-CM

## 2024-09-04 DIAGNOSIS — E669 Obesity, unspecified: Secondary | ICD-10-CM | POA: Insufficient documentation

## 2024-09-04 NOTE — Progress Notes (Signed)
 "  @Patient  ID: [name], female    DOB: [birth year], 79 y.o.   MRN: [id number]  Chief Complaint  Patient presents with   Establish Care    Sleep - no osa symtoms  No cpap - unsure of sleep study Referred for pulm htn    Referring provider: Miriam Norris, NP  HPI: 79 year old female, never smoker referred for sleep consult. Past medical history significant for PAF, GIIDD, unstable angina, CAD s/p CABG, MVR, DM, CKD, hx of breast cancer.   TEST/EVENTS:   09/04/2024: Today - sleep consult Discussed the use of AI scribe software for clinical note transcription with the patient, who gave verbal consent to proceed.  History of Present Illness [name] is a 79 year old female with atrial fibrillation and pulmonary hypertension who presents for evaluation to rule out sleep apnea. She was referred by her cardiologist for evaluation to rule out sleep apnea.  She has a history of atrial fibrillation, noted during her recent hospitalization in September for NSTEMI/angina. Since her discharge from the hospital, she has not experienced further episodes per cardiology's last note, reviewed in her chart.   She has a history of severely elevated pulmonary hypertension noted on recent echo. Despite this, she has no difficulties with breathing. No leg swelling, orthopnea, PND, CP, palpitations. No cough, chest congestion. She has a history of mild valve disease and diastolic dysfunction.  No symptoms typically associated with sleep apnea, such as snoring, drowsy driving, daytime fatigue, teeth grinding, and morning headaches. No sleep parasomnias/paralysis. She has not been told that she stops breathing during sleep, although she was on oxygen briefly at night during a hospital stay in October.  She wakes up two to four times per night to urinate. Sleep is not always the most restful.   She goes to bed around 9:30 pm. Gets up around 8 am. Does not operate any heavy machinery in her job  animal nutritionist. Weight has been stable. No prior sleep study. No oxygen use at home. Lives alone. Works for a paramedic.      09/04/2024    9:00 AM  Results of the Epworth flowsheet  Sitting and reading 0  Watching TV 0  Sitting, inactive in a public place (e.g. a theatre or a meeting) 0  As a passenger in a car for an hour without a break 0  Lying down to rest in the afternoon when circumstances permit 0  Sitting and talking to someone 0  Sitting quietly after a lunch without alcohol  0  In a car, while stopped for a few minutes in traffic 0  Total score 0       Allergies[1]  Immunization History  Administered Date(s) Administered   Fluad Quad(high Dose 65+) 05/05/2019   INFLUENZA, HIGH DOSE SEASONAL PF 05/28/2018   Influenza Split 05/28/2016   Influenza-Unspecified 06/15/2020   Moderna Sars-Covid-2 Vaccination 10/08/2019, 11/09/2019, 07/02/2020    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; left breast DCIS 02/05/22   Carotid artery occlusion  Chronic kidney disease    mild, stage 1   DM (diabetes mellitus) (HCC)    takes Metformin  1000mg  BID   Dyslipidemia    takes Pravastatin  daily   Early cataracts, bilateral    Gout    takes Allopurinol  daily   Herniated disc    left   Lumbar herniated disc    Myocardial infarction [facility]) 2008   pt has 2 stents   Nocturia    Obesity    Personal history of radiation therapy    Unspecified essential hypertension    takes Metoprolol ,Lisinopril ,and Amlodipine    Urinary frequency     Tobacco History: Tobacco Use History[2] Counseling given: Not  Answered   Outpatient Medications Prior to Visit  Medication Sig Dispense Refill   acetaminophen  (TYLENOL ) 500 MG tablet Take 500 mg by mouth 2 (two) times daily as needed for moderate pain or headache.      allopurinol  (ZYLOPRIM ) 100 MG tablet Take 100 mg by mouth at bedtime.     Ascorbic Acid  (VITAMIN C ) 1000 MG tablet Take 1 tablet (1,000 mg total) by mouth daily. 30 tablet 0   aspirin  81 MG EC tablet Take 81 mg by mouth at bedtime.     atorvastatin  (LIPITOR) 40 MG tablet Take 40 mg by mouth at bedtime.     chlorthalidone  (HYGROTON ) 25 MG tablet Take 12.5 mg by mouth every morning.     Cholecalciferol  (VITAMIN D ) 50 MCG (2000 UT) tablet Take 1 tablet (2,000 Units total) by mouth daily. 30 tablet 0   Cyanocobalamin  (VITAMIN B-12 PO) Take 1 tablet by mouth daily.     fluticasone (FLONASE) 50 MCG/ACT nasal spray Place 1 spray into both nostrils as needed for allergies or rhinitis.     hydrALAZINE  (APRESOLINE ) 25 MG tablet Take 25 mg by mouth 2 (two) times daily.     LUMIGAN 0.01 % SOLN Place 1 drop into both eyes at bedtime.     metFORMIN  (GLUCOPHAGE ) 500 MG tablet Take 500 mg by mouth 2 (two) times daily with a meal.      metoprolol  tartrate (LOPRESSOR ) 50 MG tablet Take 1 tablet (50 mg total) by mouth 2 (two) times daily. 180 tablet 3   nitroGLYCERIN  (NITROSTAT ) 0.4 MG SL tablet Place 1 tablet (0.4 mg total) under the tongue every 5 (five) minutes x 3 doses as needed for chest pain (If no relief after 3rd dose, proceed to the ED or call 911). 25 tablet 3   potassium chloride  (KLOR-CON ) 10 MEQ tablet Take 10 mEq by mouth every Monday, Wednesday, and Friday.     ticagrelor  (BRILINTA ) 90 MG TABS tablet Take 1 tablet (90 mg total) by mouth 2 (two) times daily. 180 tablet 3   No facility-administered medications prior to visit.     Review of Systems: as above    Physical Exam:  BP (!) 140/60   Pulse 65   Ht 5' 2 (1.575 m)   Wt 192 lb (87.1 kg)   SpO2 100% Comment: ra  BMI 35.12  kg/m   GEN: Pleasant, interactive, well-appearing; obese; in no acute distress HEENT:  Normocephalic and atraumatic. PERRLA. Sclera white. Nasal turbinates pink, moist and patent bilaterally. No rhinorrhea present. Oropharynx pink and moist, without exudate or edema. No lesions, ulcerations, or postnasal drip.  NECK:  Supple w/ fair ROM. No JVD present.  CV: RRR, no m/r/g, no peripheral edema. Pulses intact, +2 bilaterally. No cyanosis, pallor or clubbing. PULMONARY:  Unlabored, regular breathing. Clear bilaterally A&P w/o wheezes/rales/rhonchi.  No accessory muscle use.  GI: BS present and normoactive. Soft, non-tender to palpation.  Neuro: A/Ox3. No focal deficits noted.   Skin: Warm, no lesions or rashe Psych: Normal affect and behavior. Judgement and thought content appropriate.     Lab Results:  CBC    Component Value Date/Time   WBC 7.0 06/22/2024 1026   RBC 3.73 (L) 06/22/2024 1026   HGB 10.7 (L) 06/22/2024 1026   HGB 13.0 09/19/2010 1152   HCT 34.2 (L) 06/22/2024 1026   HCT 39.3 09/19/2010 1152   PLT 248 06/22/2024 1026   PLT 276 09/19/2010 1152   MCV 91.7 06/22/2024 1026   MCV 89.2 09/19/2010 1152   MCH 28.7 06/22/2024 1026   MCHC 31.3 06/22/2024 1026   RDW 16.8 (H) 06/22/2024 1026   RDW 14.8 (H) 09/19/2010 1152   LYMPHSABS 3.0 03/16/2024 0923   LYMPHSABS 2.2 09/19/2010 1152   MONOABS 0.7 03/16/2024 0923   MONOABS 0.5 09/19/2010 1152   EOSABS 0.2 03/16/2024 0923   EOSABS 0.3 09/19/2010 1152   BASOSABS 0.0 03/16/2024 0923   BASOSABS 0.0 09/19/2010 1152    BMET    Component Value Date/Time   NA 140 06/22/2024 1026   NA 145 09/19/2010 1152   K 4.8 06/22/2024 1026   K 4.1 09/19/2010 1152   CL 106 06/22/2024 1026   CL 98 09/19/2010 1152   CO2 20 (L) 06/22/2024 1026   CO2 30 09/19/2010 1152   GLUCOSE 116 (H) 06/22/2024 1026   GLUCOSE 126 (H) 09/19/2010 1152   BUN 44 (H) 06/22/2024 1026   BUN 19 09/19/2010 1152   CREATININE 1.82 (H) 06/22/2024 1026    CREATININE 1.3 (H) 09/19/2010 1152   CALCIUM  9.9 06/22/2024 1026   CALCIUM  9.1 09/19/2010 1152   GFRNONAA 28 (L) 06/22/2024 1026   GFRAA 48 (L) 12/08/2019 1330    BNP No results found for: BNP   Imaging:  No results found.  Administration History     None           No data to display          No results found for: NITRICOXIDE      Assessment & Plan:   Suspected sleep apnea She has severely elevated pulmonary artery pressures and recent PAF, which was likely stress induced related to NSTEMI. No recurrence since. She does have left sided heart disease and mild valvular disease, which could be the cause of her PH. Moderate suspicion for sleep apnea. She does have nocturia and non-restorative sleep. Given this,  I am concerned she could have sleep disordered breathing with obstructive sleep apnea. She will need sleep study for further evaluation.    - discussed how weight can impact sleep and risk for sleep disordered breathing - discussed options to assist with weight loss: combination of diet modification, cardiovascular and strength training exercises   - had an extensive discussion regarding the adverse health consequences related to untreated sleep disordered breathing - specifically discussed the risks for hypertension, coronary artery disease, cardiac dysrhythmias, cerebrovascular disease, and diabetes - lifestyle modification discussed   - discussed how sleep disruption can increase risk of accidents, particularly when driving - safe driving practices were discussed  Patient Instructions  Given your history, I am concerned that you may have sleep disordered breathing with sleep apnea. You will need a sleep study for further evaluation. Someone will contact you to schedule this.   We discussed how untreated sleep apnea puts an individual at risk for  cardiac arrhthymias, pulm HTN, DM, stroke and increases their risk for daytime accidents. We also briefly  reviewed treatment options including weight loss, side sleeping position, oral appliance, CPAP therapy or referral to ENT for possible surgical options  Use caution when driving and pull over if you become sleepy.  Follow up in 6-8 weeks with Dr. Catherine to go over sleep study results, or sooner, if needed.       Pulmonary hypertension (HCC) Severely elevated PH on recent echo. Euvolemic on exam. No associated symptoms. See above. Follow up with cardiology as scheduled   PAF (paroxysmal atrial fibrillation) (HCC) No recurrent post discharge. Continue f/u with cardiology   Obesity BMI 35. Healthy weight loss encouraged   Advised if symptoms do not improve or worsen, to please contact office for sooner follow up or seek emergency care.   I spent 35 minutes of dedicated to the care of this patient on the date of this encounter to include pre-visit review of records, face-to-face time with the patient discussing conditions above, post visit ordering of testing, clinical documentation with the electronic health record, making appropriate referrals as documented, and communicating necessary findings to members of the patients care team.  Comer LULLA Rouleau, NP 09/04/2024  Pt aware and understands NP's role.      [1] Not on File [2]  Social History Tobacco Use  Smoking Status Never  Smokeless Tobacco Never   "

## 2024-09-04 NOTE — Assessment & Plan Note (Signed)
 BMI 35. Healthy weight loss encouraged.

## 2024-09-04 NOTE — Assessment & Plan Note (Signed)
 Severely elevated PH on recent echo. Euvolemic on exam. No associated symptoms. See above. Follow up with cardiology as scheduled

## 2024-09-04 NOTE — Assessment & Plan Note (Signed)
 She has severely elevated pulmonary artery pressures and recent PAF, which was likely stress induced related to NSTEMI. No recurrence since. She does have left sided heart disease and mild valvular disease, which could be the cause of her PH. Moderate suspicion for sleep apnea. She does have nocturia and non-restorative sleep. Given this,  I am concerned she could have sleep disordered breathing with obstructive sleep apnea. She will need sleep study for further evaluation.    - discussed how weight can impact sleep and risk for sleep disordered breathing - discussed options to assist with weight loss: combination of diet modification, cardiovascular and strength training exercises   - had an extensive discussion regarding the adverse health consequences related to untreated sleep disordered breathing - specifically discussed the risks for hypertension, coronary artery disease, cardiac dysrhythmias, cerebrovascular disease, and diabetes - lifestyle modification discussed   - discussed how sleep disruption can increase risk of accidents, particularly when driving - safe driving practices were discussed  Patient Instructions  Given your history, I am concerned that you may have sleep disordered breathing with sleep apnea. You will need a sleep study for further evaluation. Someone will contact you to schedule this.   We discussed how untreated sleep apnea puts an individual at risk for cardiac arrhthymias, pulm HTN, DM, stroke and increases their risk for daytime accidents. We also briefly reviewed treatment options including weight loss, side sleeping position, oral appliance, CPAP therapy or referral to ENT for possible surgical options  Use caution when driving and pull over if you become sleepy.  Follow up in 6-8 weeks with Dr. Catherine to go over sleep study results, or sooner, if needed.

## 2024-09-04 NOTE — Patient Instructions (Addendum)
 Given your history, I am concerned that you may have sleep disordered breathing with sleep apnea. You will need a sleep study for further evaluation. Someone will contact you to schedule this.   We discussed how untreated sleep apnea puts an individual at risk for cardiac arrhthymias, pulm HTN, DM, stroke and increases their risk for daytime accidents. We also briefly reviewed treatment options including weight loss, side sleeping position, oral appliance, CPAP therapy or referral to ENT for possible surgical options  Use caution when driving and pull over if you become sleepy.  Follow up in 6-8 weeks with Dr. Catherine to go over sleep study results, or sooner, if needed.

## 2024-09-04 NOTE — Assessment & Plan Note (Signed)
 No recurrent post discharge. Continue f/u with cardiology

## 2024-11-05 ENCOUNTER — Encounter: Admitting: Pulmonary Disease

## 2025-01-15 ENCOUNTER — Ambulatory Visit (HOSPITAL_COMMUNITY)

## 2025-03-09 ENCOUNTER — Other Ambulatory Visit

## 2025-03-16 ENCOUNTER — Inpatient Hospital Stay: Admitting: Oncology

## 2025-03-16 ENCOUNTER — Ambulatory Visit: Admitting: Oncology
